# Patient Record
Sex: Male | Born: 1940 | Race: Black or African American | Hispanic: No | Marital: Married | Smoking: Former smoker
Health system: Southern US, Community
[De-identification: ages and names within clinical notes are randomized; demographics above are authoritative.]

## PROBLEM LIST (undated history)

## (undated) DIAGNOSIS — K759 Inflammatory liver disease, unspecified: Secondary | ICD-10-CM

## (undated) DIAGNOSIS — R0600 Dyspnea, unspecified: Secondary | ICD-10-CM

## (undated) DIAGNOSIS — I499 Cardiac arrhythmia, unspecified: Secondary | ICD-10-CM

## (undated) DIAGNOSIS — I429 Cardiomyopathy, unspecified: Secondary | ICD-10-CM

## (undated) DIAGNOSIS — I5022 Chronic systolic (congestive) heart failure: Secondary | ICD-10-CM

## (undated) DIAGNOSIS — M1990 Unspecified osteoarthritis, unspecified site: Secondary | ICD-10-CM

## (undated) DIAGNOSIS — I10 Essential (primary) hypertension: Secondary | ICD-10-CM

## (undated) DIAGNOSIS — I4891 Unspecified atrial fibrillation: Secondary | ICD-10-CM

## (undated) DIAGNOSIS — L02416 Cutaneous abscess of left lower limb: Secondary | ICD-10-CM

## (undated) DIAGNOSIS — I2510 Atherosclerotic heart disease of native coronary artery without angina pectoris: Secondary | ICD-10-CM

## (undated) HISTORY — PX: TOE AMPUTATION: SHX809

---
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
## 2015-08-16 ENCOUNTER — Emergency Department (HOSPITAL_BASED_OUTPATIENT_CLINIC_OR_DEPARTMENT_OTHER): Payer: Non-veteran care

## 2015-08-16 ENCOUNTER — Emergency Department (HOSPITAL_BASED_OUTPATIENT_CLINIC_OR_DEPARTMENT_OTHER)
Admission: EM | Admit: 2015-08-16 | Discharge: 2015-08-16 | Disposition: A | Discharge status: Home / Self Care | Payer: Non-veteran care | Attending: Emergency Medicine | Admitting: Emergency Medicine

## 2015-08-16 ENCOUNTER — Encounter (HOSPITAL_BASED_OUTPATIENT_CLINIC_OR_DEPARTMENT_OTHER): Payer: Self-pay

## 2015-08-16 DIAGNOSIS — I4892 Unspecified atrial flutter: Secondary | ICD-10-CM | POA: Insufficient documentation

## 2015-08-16 DIAGNOSIS — I10 Essential (primary) hypertension: Secondary | ICD-10-CM | POA: Diagnosis not present

## 2015-08-16 DIAGNOSIS — Z79899 Other long term (current) drug therapy: Secondary | ICD-10-CM | POA: Diagnosis not present

## 2015-08-16 DIAGNOSIS — J069 Acute upper respiratory infection, unspecified: Secondary | ICD-10-CM | POA: Insufficient documentation

## 2015-08-16 DIAGNOSIS — R600 Localized edema: Secondary | ICD-10-CM | POA: Insufficient documentation

## 2015-08-16 DIAGNOSIS — M791 Myalgia: Secondary | ICD-10-CM | POA: Diagnosis present

## 2015-08-16 HISTORY — DX: Essential (primary) hypertension: I10

## 2015-08-16 LAB — CBC WITH DIFFERENTIAL/PLATELET
BASOS PCT: 1 %
Basophils Absolute: 0 10*3/uL (ref 0.0–0.1)
EOS ABS: 0.1 10*3/uL (ref 0.0–0.7)
EOS PCT: 1 %
HCT: 47.9 % (ref 39.0–52.0)
Hemoglobin: 16.1 g/dL (ref 13.0–17.0)
LYMPHS ABS: 2.8 10*3/uL (ref 0.7–4.0)
Lymphocytes Relative: 35 %
MCH: 27.6 pg (ref 26.0–34.0)
MCHC: 33.6 g/dL (ref 30.0–36.0)
MCV: 82 fL (ref 78.0–100.0)
MONO ABS: 1.6 10*3/uL — AB (ref 0.1–1.0)
MONOS PCT: 21 %
NEUTROS PCT: 42 %
Neutro Abs: 3.3 10*3/uL (ref 1.7–7.7)
PLATELETS: 269 10*3/uL (ref 150–400)
RBC: 5.84 MIL/uL — ABNORMAL HIGH (ref 4.22–5.81)
RDW: 13.7 % (ref 11.5–15.5)
WBC: 7.8 10*3/uL (ref 4.0–10.5)

## 2015-08-16 LAB — BASIC METABOLIC PANEL
ANION GAP: 10 (ref 5–15)
BUN: 17 mg/dL (ref 6–20)
CALCIUM: 8.4 mg/dL — AB (ref 8.9–10.3)
CO2: 22 mmol/L (ref 22–32)
Chloride: 104 mmol/L (ref 101–111)
Creatinine, Ser: 0.91 mg/dL (ref 0.61–1.24)
GFR calc Af Amer: >60 mL/min (ref >60)
GLUCOSE: 109 mg/dL — AB (ref 65–99)
Potassium: 3.9 mmol/L (ref 3.5–5.1)
Sodium: 136 mmol/L (ref 135–145)

## 2015-08-16 LAB — BRAIN NATRIURETIC PEPTIDE: B NATRIURETIC PEPTIDE 5: 226.5 pg/mL — AB (ref 0.0–100.0)

## 2015-08-16 LAB — TROPONIN I
TROPONIN I: 0.06 ng/mL — AB (ref <0.031)
Troponin I: 0.06 ng/mL — ABNORMAL HIGH (ref <0.031)

## 2015-08-16 MED ORDER — BENZONATATE 100 MG PO CAPS: 100.0000 mg | ORAL_CAPSULE | Freq: Three times a day (TID) | ORAL | Status: DC

## 2015-08-16 MED ORDER — FUROSEMIDE 20 MG PO TABS: 20.0000 mg | ORAL_TABLET | Freq: Every day | ORAL | Status: DC

## 2015-08-16 MED ORDER — POTASSIUM CHLORIDE ER 10 MEQ PO TBCR: 10.0000 meq | EXTENDED_RELEASE_TABLET | Freq: Every day | ORAL | Status: DC

## 2015-08-16 NOTE — ED Notes (Signed)
MD at bedside. 

## 2015-08-16 NOTE — ED Notes (Signed)
Pt reports that he has been treated for tachycardia since 27th of feb, currently on metroprolol, on recheck patient's heart rate wnl.

## 2015-08-16 NOTE — ED Provider Notes (Signed)
CSN: [id number]     Arrival date & time 08/16/15  1828 History  By signing my name below, I, Christopher Benton, attest that this documentation has been prepared under the direction and in the presence of Quintella Reichert, MD . Electronically Signed: [name], Scribe. 08/16/2015. 7:39 PM.    Chief Complaint  Patient presents with  . Generalized Body Aches   The history is provided by the patient. No language interpreter was used.    HPI Comments:  Christopher Benton is a 75 y.o. male who presents to the Emergency Department complaining of chills and general malaise x 2 days with associated subjective fever and dry cough. Pt's wife and daughter with similar symptoms recently. No alleviating factors noted. He denies CP, abdominal pain, vomiting, diarrhea, dysuria,and LE swelling.   Pt was evaluated on 07/29/15 secondary to tachycardia. He was diagnosed with AFIB and placed on metoprolol. He was also started on Pradaxa and it was discontinued due to intolerance.  Cardiologist- Stanford Breed; pt has appointment set for the 29th of March 2017.   Past Medical History  Diagnosis Date  . Hypertension    History reviewed. No pertinent past surgical history. No family history on file. Social History  Substance Use Topics  . Smoking status: Never Smoker   . Smokeless tobacco: None  . Alcohol Use: No    Review of Systems  Constitutional: Positive for fever (subjective) and chills.  Respiratory: Positive for cough.   Cardiovascular: Negative for chest pain and leg swelling.  Gastrointestinal: Negative for vomiting, abdominal pain and diarrhea.  Genitourinary: Negative for dysuria.  Musculoskeletal: Positive for myalgias.  All other systems reviewed and are negative.   Allergies  Review of patient's allergies indicates no known allergies.  Home Medications   Prior to Admission medications   Medication Sig Start Date End Date Taking? Authorizing Provider  benzonatate (TESSALON) 100 MG capsule  Take 1 capsule (100 mg total) by mouth every 8 (eight) hours. 08/16/15   Quintella Reichert, MD  furosemide (LASIX) 20 MG tablet Take 1 tablet (20 mg total) by mouth daily. 08/16/15   Quintella Reichert, MD  metoprolol (LOPRESSOR) 50 MG tablet Take 50 mg by mouth 2 (two) times daily.   Yes Historical Provider, MD  potassium chloride (K-DUR) 10 MEQ tablet Take 1 tablet (10 mEq total) by mouth daily. 08/16/15   Quintella Reichert, MD   BP 120/78 mmHg  Pulse 43  Temp(Src) 98.7 F (37.1 C) (Oral)  Resp 12  SpO2 95% Physical Exam  Constitutional: He is oriented to person, place, and time. He appears well-developed and well-nourished.  HENT:  Head: Normocephalic and atraumatic.  Cardiovascular: Normal rate.  An irregularly irregular rhythm present.  No murmur heard. Pulmonary/Chest: Effort normal and breath sounds normal. No respiratory distress.  Abdominal: Soft. There is no tenderness. There is no rebound and no guarding.  Musculoskeletal: He exhibits edema. He exhibits no tenderness.  1+ pitting edema BLE  Neurological: He is alert and oriented to person, place, and time.  Skin: Skin is warm and dry.  Psychiatric: He has a normal mood and affect. His behavior is normal.  Nursing note and vitals reviewed.   ED Course  Procedures   DIAGNOSTIC STUDIES:  Oxygen Saturation is 97% on RA, normal by my interpretation.    COORDINATION OF CARE:  7:39 PM Will order EKG Discussed treatment plan with pt at bedside and pt agreed to plan.  Labs Review Labs Reviewed  CBC WITH DIFFERENTIAL/PLATELET - Abnormal; Notable for  the following:    RBC 5.84 (*)    Monocytes Absolute 1.6 (*)    All other components within normal limits  TROPONIN I - Abnormal; Notable for the following:    Troponin I 0.06 (*)    All other components within normal limits  BRAIN NATRIURETIC PEPTIDE - Abnormal; Notable for the following:    B Natriuretic Peptide 226.5 (*)    All other components within normal limits  BASIC  METABOLIC PANEL - Abnormal; Notable for the following:    Glucose, Bld 109 (*)    Calcium 8.4 (*)    All other components within normal limits  TROPONIN I - Abnormal; Notable for the following:    Troponin I 0.06 (*)    All other components within normal limits    Imaging Review Dg Chest 2 View  08/16/2015  CLINICAL DATA:  Cough and hypertension EXAM: CHEST  2 VIEW COMPARISON:  None. FINDINGS: Lungs are clear. Heart is borderline enlarged with pulmonary vascularity within normal limits. No adenopathy. There is degenerative change in the thoracic spine. IMPRESSION: Heart borderline enlarged.  No edema or consolidation. Electronically Signed   By: Lowella Grip III M.D.   On: 08/16/2015 19:04   I have personally reviewed and evaluated these images and lab results as part of my medical decision-making.   EKG Interpretation None      MDM   Final diagnoses:  URI (upper respiratory infection)  Atrial flutter, unspecified type [facility])   Patient here for evaluation of cough and malaise. He is nontoxic appearing on examination with no respiratory distress. He does have an irregular rhythm. He is not tachycardic in the department. He has some lower extremity edema but no evidence of pulmonary edema. EKG demonstrates atrial flutter. BNP is mildly elevated. Troponin is mildly elevated. Elevated troponin is likely secondary to his underlying atrial flutter. The presentation is not consistent with ACS or PE. Please note on documentation of vital signs that patient's was not bradycardic to the 40s but he his heart rate was consistently in the 70s to 80s range. Discussed with patient home care for URI as well as atrial flutter. Stressed the importance of following up with the cardiologist. Recommend follow-up sooner than March 29. Discussed outpatient follow-up and very close return precautions for any worsening or new symptoms.  I personally performed the services described in this documentation,  which was scribed in my presence. The recorded information has been reviewed and is accurate.    Quintella Reichert, MD 08/17/15 [phone]

## 2015-08-16 NOTE — Discharge Instructions (Signed)
Cough, Adult Coughing is a reflex that clears your throat and your airways. Coughing helps to heal and protect your lungs. It is normal to cough occasionally, but a cough that happens with other symptoms or lasts a long time may be a sign of a condition that needs treatment. A cough may last only 2-3 weeks (acute), or it may last longer than 8 weeks (chronic). CAUSES Coughing is commonly caused by:  Breathing in substances that irritate your lungs.  A viral or bacterial respiratory infection.  Allergies.  Asthma.  Postnasal drip.  Smoking.  Acid backing up from the stomach into the esophagus (gastroesophageal reflux).  Certain medicines.  Chronic lung problems, including COPD (or rarely, lung cancer).  Other medical conditions such as heart failure. HOME CARE INSTRUCTIONS  Pay attention to any changes in your symptoms. Take these actions to help with your discomfort:  Take medicines only as told by your health care provider.  If you were prescribed an antibiotic medicine, take it as told by your health care provider. Do not stop taking the antibiotic even if you start to feel better.  Talk with your health care provider before you take a cough suppressant medicine.  Drink enough fluid to keep your urine clear or pale yellow.  If the air is dry, use a cold steam vaporizer or humidifier in your bedroom or your home to help loosen secretions.  Avoid anything that causes you to cough at work or at home.  If your cough is worse at night, try sleeping in a semi-upright position.  Avoid cigarette smoke. If you smoke, quit smoking. If you need help quitting, ask your health care provider.  Avoid caffeine.  Avoid alcohol.  Rest as needed. SEEK MEDICAL CARE IF:   You have new symptoms.  You cough up pus.  Your cough does not get better after 2-3 weeks, or your cough gets worse.  You cannot control your cough with suppressant medicines and you are losing sleep.  You  develop pain that is getting worse or pain that is not controlled with pain medicines.  You have a fever.  You have unexplained weight loss.  You have night sweats. SEEK IMMEDIATE MEDICAL CARE IF:  You cough up blood.  You have difficulty breathing.  Your heartbeat is very fast.   This information is not intended to replace advice given to you by your health care provider. Make sure you discuss any questions you have with your health care provider.   Document Released: 11/14/2010 Document Revised: 02/06/2015 Document Reviewed: 07/25/2014 Elsevier Interactive Patient Education 2016 Elsevier Inc.  Atrial Flutter Atrial flutter is a heart rhythm that can cause the heart to beat very fast (tachycardia). It originates in the upper chambers of the heart (atria). In atrial flutter, the top chambers of the heart (atria) often beat much faster than the bottom chambers of the heart (ventricles). Atrial flutter has a regular "saw toothed" appearance in an EKG readout. An EKG is a test that records the electrical activity of the heart. Atrial flutter can cause the heart to beat up to 150 beats per minute (BPM). Atrial flutter can either be short lived (paroxysmal) or permanent.  CAUSES  Causes of atrial flutter can be many. Some of these include:  Heart related issues:  Heart attack (myocardial infarction).  Heart failure.  Heart valve problems.  Poorly controlled high blood pressure (hypertension).  After open heart surgery.  Lung related issues:  A blood clot in the lungs (pulmonary embolism).  Chronic obstructive pulmonary disease (COPD). Medications used to treat COPD can attribute to atrial flutter.  Other related causes:  Hyperthyroidism.  Caffeine.  Some decongestant cold medications.  Low electrolyte levels such as potassium or magnesium.  Cocaine. SYMPTOMS  An awareness of your heart beating rapidly (palpitations).  Shortness of breath.  Chest pain.  Low  blood pressure (hypotension).  Dizziness or fainting. DIAGNOSIS  Different tests can be performed to diagnose atrial flutter.   An EKG.  Holter monitor. This is a 24-hour recording of your heart rhythm. You will also be given a diary. Write down all symptoms that you have and what you were doing at the time you experienced symptoms.  Cardiac event monitor. This small device can be worn for up to 30 days. When you have heart symptoms, you will push a button on the device. This will then record your heart rhythm.  Echocardiogram. This is an imaging test to look at your heart. Your caregiver will look at your heart valves and the ventricles.  Stress test. This test can help determine if the atrial flutter is related to exercise or if coronary artery disease is present.  Laboratory studies will look at certain blood levels like:  Complete blood count (CBC).  Potassium.  Magnesium.  Thyroid function. TREATMENT  Treatment of atrial flutter varies. A combination of therapies may be used or sometimes atrial flutter may need only 1 type of treatment.  Lab work: If your blood work, such as your electrolytes (potassium, magnesium) or your thyroid function tests, are abnormal, your caregiver will treat them accordingly.  Medication:  There are several different types of medications that can convert your heart to a normal rhythm and prevent atrial flutter from reoccurring.  Nonsurgical procedures: Nonsurgical techniques may be used to control atrial flutter. Some examples include:  Cardioversion. This technique uses either drugs or an electrical shock to restore a normal heart rhythm:  Cardioversion drugs may be given through an intravenous (IV) line to help "reset" the heart rhythm.  In electrical cardioversion, your caregiver shocks your heart with electrical energy. This helps to reset the heartbeat to a normal rhythm.  Ablation. If atrial flutter is a persistent problem, an ablation  may be needed. This procedure is done under mild sedation. High frequency radio-wave energy is used to destroy the area of heart tissue responsible for atrial flutter. SEEK IMMEDIATE MEDICAL CARE IF:  You have:  Dizziness.  Near fainting or fainting.  Shortness of breath.  Chest pain or pressure.  Sudden nausea or vomiting.  Profuse sweating. If you have the above symptoms, call your local emergency service immediately! Do not drive yourself to the hospital. MAKE SURE YOU:   Understand these instructions.  Will watch your condition.  Will get help right away if you are not doing well or get worse.   This information is not intended to replace advice given to you by your health care provider. Make sure you discuss any questions you have with your health care provider.   Document Released: 10/04/2008 Document Revised: 06/08/2014 Document Reviewed: 11/30/2014 Elsevier Interactive Patient Education [employer].

## 2015-08-16 NOTE — ED Notes (Signed)
Reports generalized body aches x 3 days. Also reports non-productive cough. Reports sick family members as well.

## 2015-08-16 NOTE — ED Notes (Signed)
Pt verbalizes understanding of d/c instructions and denies any further needs at this time. 

## 2015-08-28 ENCOUNTER — Encounter: Payer: Self-pay | Admitting: *Deleted

## 2015-08-28 ENCOUNTER — Other Ambulatory Visit: Payer: Self-pay | Admitting: Cardiology

## 2015-08-28 ENCOUNTER — Ambulatory Visit (INDEPENDENT_AMBULATORY_CARE_PROVIDER_SITE_OTHER): Payer: Non-veteran care | Admitting: Cardiology

## 2015-08-28 ENCOUNTER — Encounter: Payer: Self-pay | Admitting: Cardiology

## 2015-08-28 VITALS — BP 136/105 | HR 70 | Ht 71.5 in | Wt 266.4 lb

## 2015-08-28 DIAGNOSIS — I10 Essential (primary) hypertension: Secondary | ICD-10-CM | POA: Insufficient documentation

## 2015-08-28 DIAGNOSIS — I484 Atypical atrial flutter: Secondary | ICD-10-CM | POA: Insufficient documentation

## 2015-08-28 DIAGNOSIS — I483 Typical atrial flutter: Secondary | ICD-10-CM

## 2015-08-28 LAB — CBC
HCT: 51.7 % (ref 39.0–52.0)
Hemoglobin: 16.3 g/dL (ref 13.0–17.0)
MCH: 27 pg (ref 26.0–34.0)
MCHC: 31.5 g/dL (ref 30.0–36.0)
MCV: 85.6 fL (ref 78.0–100.0)
MPV: 10.7 fL (ref 8.6–12.4)
PLATELETS: 283 10*3/uL (ref 150–400)
RBC: 6.04 MIL/uL — ABNORMAL HIGH (ref 4.22–5.81)
RDW: 13.8 % (ref 11.5–15.5)
WBC: 11.9 10*3/uL — ABNORMAL HIGH (ref 4.0–10.5)

## 2015-08-28 MED ORDER — METOPROLOL TARTRATE 100 MG PO TABS: 100.0000 mg | ORAL_TABLET | Freq: Two times a day (BID) | ORAL | Status: DC

## 2015-08-28 MED ORDER — APIXABAN 5 MG PO TABS: 5.0000 mg | ORAL_TABLET | Freq: Two times a day (BID) | ORAL | Status: DC

## 2015-08-28 NOTE — Assessment & Plan Note (Signed)
Blood pressure is elevated and heart rate up to normal. Increase metoprolol to 75 mg twice a day.Follow blood pressure and adjust as needed.

## 2015-08-28 NOTE — Assessment & Plan Note (Addendum)
Patient presents with newly diagnosed atrial flutter. He apparently was admitted to the [place] in February with atrial fibrillation but no records are available. We will obtain records concerning his rhythm, echocardiogram and lab work. He is symptomatic with his atrial flutter. He notes fatigue and dyspnea on exertion. Check hemoglobin, renal function and TSH. We will initiate apixaban 5 mg BID (CHADSvasc 2). Proceed with TEE guided cardioversion next week. If all of his rhythm disturbance has been atrial flutter we will refer to electrophysiology for consideration of ablation. This would allow discontinuation of anticoagulation in the future. If he indeed did have atrial fibrillation at the [place] we will plan to continue with metoprolol and anticoagulation with addition of antiarrhythmics in the future if atrial fibrillation recurs.

## 2015-08-28 NOTE — Patient Instructions (Signed)
Medication Instructions:   INCREASE METOPROLOL TO 100 MG TWICE DAILY=2 OF THE 50 MG TABLETS TWICE DAILY  START ELIQUIS 5 MG TWICE DAILY  Labwork:  Your physician recommends that you return for lab work in: [place]   Testing/Procedures:  Your physician has requested that you have a TEE/Cardioversion. During a TEE, sound waves are used to create images of your heart. It provides your doctor with information about the size and shape of your heart and how well your heart's chambers and valves are working. In this test, a transducer is attached to the end of a flexible tube that is guided down you throat and into your esophagus (the tube leading from your mouth to your stomach) to get a more detailed image of your heart. Once the TEE has determined that a blood clot is not present, the cardioversion begins. Electrical Cardioversion uses a jolt of electricity to your heart either through paddles or wired patches attached to your chest. This is a controlled, usually prescheduled, procedure. This procedure is done at the hospital and you are not awake during the procedure. You usually go home the day of the procedure. Please see the instruction sheet given to you today for more information.    Follow-Up:  Your physician recommends that you schedule a follow-up appointment in: [place]

## 2015-08-28 NOTE — Progress Notes (Signed)
     HPI: 75 year old male for evaluation of atrial flutter. In February the patient experienced fatigue and his heart rate was elevated. He apparently was admitted to the [facility] with a diagnosis of atrial fibrillation. No records are available. He had an echocardiogram and blood work. He was placed on metoprolol and pradaxa. Pradaxa was DCed due to nausea. Seen recently in the emergency room for URI. Troponin 0.06. Chest x-ray negative. Creatinine 0.91. Hemoglobin 16.1. BNP 226. Electrocardiogram showed atrial flutter. Patient notes increased fatigue and mild dyspnea on exertion. No orthopnea, PND, pedal edema, palpitations, syncope, chest pain or bleeding.  Current Outpatient Prescriptions  Medication Sig Dispense Refill  . aspirin 325 MG tablet Take 325 mg by mouth daily.    . metoprolol (LOPRESSOR) 50 MG tablet Take 50 mg by mouth 2 (two) times daily.     No current facility-administered medications for this visit.    Allergies  Allergen Reactions  . Pradaxa [Dabigatran Etexilate Mesylate] Nausea Only     Past Medical History  Diagnosis Date  . Hypertension     Past Surgical History  Procedure Laterality Date  . Toe amputation      Social History   Social History  . Marital Status: Married    Spouse Name: N/A  . Number of Children: 2  . Years of Education: N/A   Occupational History  . Not on file.   Social History Main Topics  . Smoking status: Former Research scientist (life sciences)  . Smokeless tobacco: Not on file  . Alcohol Use: 0.0 oz/week    0 Standard drinks or equivalent per week     Comment: Occasional  . Drug Use: Not on file  . Sexual Activity: Not on file   Other Topics Concern  . Not on file   Social History Narrative    Family History  Problem Relation Age of Onset  . Heart disease      No family history    ROS: no fevers or chills, productive cough, hemoptysis, dysphasia, odynophagia, melena, hematochezia, dysuria, hematuria, rash, seizure activity,  orthopnea, PND, pedal edema, claudication. Remaining systems are negative.  Physical Exam:   Blood pressure 136/105, pulse 70, height 5' 11.5" (1.816 m), weight 266 lb 6.4 oz (120.838 kg).  General:  Well developed/well nourished in NAD Skin warm/dry Patient not depressed No peripheral clubbing Back-normal HEENT-normal/normal eyelids Neck supple/normal carotid upstroke bilaterally; no bruits; no JVD; no thyromegaly chest - CTA/ normal expansion CV - irregular/normal S1 and S2; no murmurs, rubs or gallops;  PMI nondisplaced Abdomen -NT/ND, no HSM, no mass, + bowel sounds, no bruit 2+ femoral pulses, no bruits Ext-no edema, chords, 2+ DP Neuro-grossly nonfocal  ECG 08/16/2015-atrial flutter with PVCs or aberrantly conducted beats. Left anterior fascicular block. Nonspecific ST changes.  Today's electrocardiogram shows typical atrial flutter, right axis deviation and nonspecific ST changes.

## 2015-08-29 ENCOUNTER — Telehealth: Payer: Self-pay | Admitting: Cardiology

## 2015-08-29 LAB — BASIC METABOLIC PANEL
BUN: 15 mg/dL (ref 7–25)
CHLORIDE: 104 mmol/L (ref 98–110)
CO2: 20 mmol/L (ref 20–31)
Calcium: 9.7 mg/dL (ref 8.6–10.3)
Creat: 0.91 mg/dL (ref 0.70–1.18)
GLUCOSE: 159 mg/dL — AB (ref 65–99)
Potassium: 4.5 mmol/L (ref 3.5–5.3)
Sodium: 138 mmol/L (ref 135–146)

## 2015-08-29 LAB — TSH: TSH: 2.45 mIU/L (ref 0.40–4.50)

## 2015-08-29 NOTE — Telephone Encounter (Signed)
Faxed Release signed by patient to [facility] to obtain records per Dr Stanford Breed request.  Faxed on 08/29/15. lp

## 2015-08-30 ENCOUNTER — Encounter: Payer: Self-pay | Admitting: *Deleted

## 2015-08-30 ENCOUNTER — Telehealth: Payer: Self-pay | Admitting: Cardiology

## 2015-08-30 NOTE — Telephone Encounter (Signed)
Spoke to patient's wife Information given. Verbalized understanding  Wife states patient had not been fasting when labs were done.  [facility] primary [facility] in the process of getting a local primary doctor  RN INFORMED WIFE -WILL MAIL A COPY OF LAB RESULTS - (WILL BE ABLE TO TAKE TO [facility] Wife verbalized understanding.

## 2015-08-30 NOTE — Telephone Encounter (Signed)
New message  ° ° ° °Wife calling back for test results.   °

## 2015-09-04 ENCOUNTER — Encounter (HOSPITAL_COMMUNITY)
Admission: RE | Disposition: A | Discharge status: Home / Self Care | Payer: Self-pay | Source: Ambulatory Visit | Attending: Cardiovascular Disease

## 2015-09-04 ENCOUNTER — Ambulatory Visit (HOSPITAL_BASED_OUTPATIENT_CLINIC_OR_DEPARTMENT_OTHER): Payer: Medicare Other

## 2015-09-04 ENCOUNTER — Ambulatory Visit (HOSPITAL_COMMUNITY): Payer: Medicare Other | Admitting: Certified Registered"

## 2015-09-04 ENCOUNTER — Ambulatory Visit (HOSPITAL_COMMUNITY)
Admission: RE | Admit: 2015-09-04 | Discharge: 2015-09-04 | Disposition: A | Discharge status: Home / Self Care | Payer: Medicare Other | Source: Ambulatory Visit | Attending: Cardiovascular Disease | Admitting: Cardiovascular Disease

## 2015-09-04 ENCOUNTER — Telehealth: Payer: Self-pay | Admitting: Cardiology

## 2015-09-04 ENCOUNTER — Encounter (HOSPITAL_COMMUNITY): Payer: Self-pay | Admitting: *Deleted

## 2015-09-04 DIAGNOSIS — E669 Obesity, unspecified: Secondary | ICD-10-CM | POA: Insufficient documentation

## 2015-09-04 DIAGNOSIS — I10 Essential (primary) hypertension: Secondary | ICD-10-CM | POA: Insufficient documentation

## 2015-09-04 DIAGNOSIS — I4892 Unspecified atrial flutter: Secondary | ICD-10-CM | POA: Insufficient documentation

## 2015-09-04 DIAGNOSIS — I483 Typical atrial flutter: Secondary | ICD-10-CM | POA: Insufficient documentation

## 2015-09-04 DIAGNOSIS — Z6836 Body mass index (BMI) 36.0-36.9, adult: Secondary | ICD-10-CM | POA: Insufficient documentation

## 2015-09-04 DIAGNOSIS — Z87891 Personal history of nicotine dependence: Secondary | ICD-10-CM | POA: Insufficient documentation

## 2015-09-04 HISTORY — PX: CARDIOVERSION: SHX1299

## 2015-09-04 HISTORY — DX: Cardiac arrhythmia, unspecified: I49.9

## 2015-09-04 HISTORY — PX: TEE WITHOUT CARDIOVERSION: SHX5443

## 2015-09-04 SURGERY — CARDIOVERSION
Anesthesia: Monitor Anesthesia Care

## 2015-09-04 MED ORDER — SODIUM CHLORIDE 0.9 % IV SOLN
INTRAVENOUS | Status: DC
  Administered 2015-09-04: 500 mL via INTRAVENOUS

## 2015-09-04 MED ORDER — PROPOFOL 500 MG/50ML IV EMUL
INTRAVENOUS | Status: DC | PRN
  Administered 2015-09-04: 200 ug/kg/min via INTRAVENOUS

## 2015-09-04 NOTE — Telephone Encounter (Signed)
New message      Pt is on his way home from having a cardioversion by Dr Oval Linsey.  He is now in sinus rhythm and his bp is 107/62.  Patient want to know if he is to continue taking his 100mg  metoprolol?  Please call before you leave if possible

## 2015-09-04 NOTE — Discharge Instructions (Signed)
Electrical Cardioversion, Care After °Refer to this sheet in the next few weeks. These instructions provide you with information on caring for yourself after your procedure. Your health care provider may also give you more specific instructions. Your treatment has been planned according to current medical practices, but problems sometimes occur. Call your health care provider if you have any problems or questions after your procedure. °WHAT TO EXPECT AFTER THE PROCEDURE °After your procedure, it is typical to have the following sensations: °· Some redness on the skin where the shocks were delivered. If this is tender, a sunburn lotion or hydrocortisone cream may help. °· Possible return of an abnormal heart rhythm within hours or days after the procedure. °HOME CARE INSTRUCTIONS °· Take medicines only as directed by your health care provider. Be sure you understand how and when to take your medicine. °· Learn how to feel your pulse and check it often. °· Limit your activity for 48 hours after the procedure or as directed by your health care provider. °· Avoid or minimize caffeine and other stimulants as directed by your health care provider. °SEEK MEDICAL CARE IF: °· You feel like your heart is beating too fast or your pulse is not regular. °· You have any questions about your medicines. °· You have bleeding that will not stop. °SEEK IMMEDIATE MEDICAL CARE IF: °· You are dizzy or feel faint. °· It is hard to breathe or you feel short of breath. °· There is a change in discomfort in your chest. °· Your speech is slurred or you have trouble moving an arm or leg on one side of your body. °· You get a serious muscle cramp that does not go away. °· Your fingers or toes turn cold or blue. °  °This information is not intended to replace advice given to you by your health care provider. Make sure you discuss any questions you have with your health care provider. °  °Document Released: 03/08/2013 Document Revised: 06/08/2014  Document Reviewed: 03/08/2013 °Elsevier Interactive Patient Education ©2016 Elsevier Inc. ° °

## 2015-09-04 NOTE — Transfer of Care (Signed)
Immediate Anesthesia Transfer of Care Note  Patient: Christopher Benton  Procedure(s) Performed: Procedure(s): CARDIOVERSION (N/A) TRANSESOPHAGEAL ECHOCARDIOGRAM (TEE) (N/A)  Patient Location: Endoscopy Unit  Anesthesia Type:MAC  Level of Consciousness: awake, alert , oriented and patient cooperative  Airway & Oxygen Therapy: Patient Spontanous Breathing and Patient connected to nasal cannula oxygen  Post-op Assessment: Report given to RN, Post -op Vital signs reviewed and stable and Patient moving all extremities  Post vital signs: Reviewed and stable  Last Vitals:  Filed Vitals:   09/04/15 1320  BP: 127/93  Pulse: 101  Resp: 20    Complications: No apparent anesthesia complications

## 2015-09-04 NOTE — Anesthesia Postprocedure Evaluation (Signed)
Anesthesia Post Note  Patient: Christopher Benton  Procedure(s) Performed: Procedure(s) (LRB): CARDIOVERSION (N/A) TRANSESOPHAGEAL ECHOCARDIOGRAM (TEE) (N/A)  Patient location during evaluation: PACU Anesthesia Type: MAC Level of consciousness: awake Pain management: pain level controlled Vital Signs Assessment: post-procedure vital signs reviewed and stable Respiratory status: spontaneous breathing and respiratory function stable Cardiovascular status: stable Postop Assessment: no signs of nausea or vomiting Anesthetic complications: no    Last Vitals:  Filed Vitals:   09/04/15 1516 09/04/15 1518  BP: 74/53 85/58  Pulse: 62   Resp: 13     Last Pain: There were no vitals filed for this visit.               Dax Murguia

## 2015-09-04 NOTE — Progress Notes (Signed)
  Echocardiogram Echocardiogram Transesophageal has been performed.  Darlina Sicilian M 09/04/2015, 3:10 PM

## 2015-09-04 NOTE — Anesthesia Preprocedure Evaluation (Signed)
Anesthesia Evaluation  Patient identified by MRN, date of birth, ID band Patient awake    Reviewed: Allergy & Precautions, NPO status , Patient's Chart, lab work & pertinent test results  Airway Mallampati: II  TM Distance: >3 FB Neck ROM: Full    Dental no notable dental hx.    Pulmonary former smoker,    Pulmonary exam normal breath sounds clear to auscultation       Cardiovascular hypertension, Normal cardiovascular exam+ dysrhythmias Atrial Fibrillation  Rhythm:Irregular Rate:Normal     Neuro/Psych negative neurological ROS  negative psych ROS   GI/Hepatic negative GI ROS, Neg liver ROS,   Endo/Other  negative endocrine ROS  Renal/GU negative Renal ROS     Musculoskeletal negative musculoskeletal ROS (+)   Abdominal (+) + obese,   Peds  Hematology negative hematology ROS (+)   Anesthesia Other Findings   Reproductive/Obstetrics                             Anesthesia Physical Anesthesia Plan  ASA: II  Anesthesia Plan: MAC   Post-op Pain Management:    Induction: Intravenous  Airway Management Planned: Simple Face Mask  Additional Equipment:   Intra-op Plan:   Post-operative Plan: Extubation in OR  Informed Consent: I have reviewed the patients History and Physical, chart, labs and discussed the procedure including the risks, benefits and alternatives for the proposed anesthesia with the patient or authorized representative who has indicated his/her understanding and acceptance.   Dental advisory given  Plan Discussed with: CRNA  Anesthesia Plan Comments:         Anesthesia Quick Evaluation

## 2015-09-04 NOTE — CV Procedure (Signed)
Brief TEE Note  LVEF approximately 20% Trivial MR, mild TR No LAA or LA thrombus or mass.  For additional details see full report.  Electrical Cardioversion Procedure Note GERAN LAUKAITIS [id number] 1940-12-29  Procedure: Electrical Cardioversion Indications:  Atrial Flutter  Procedure Details Consent: Risks of procedure as well as the alternatives and risks of each were explained to the (patient/caregiver).  Consent for procedure obtained. Time Out: Verified patient identification, verified procedure, site/side was marked, verified correct patient position, special equipment/implants available, medications/allergies/relevent history reviewed, required imaging and test results available.  Performed  Patient placed on cardiac monitor, pulse oximetry, supplemental oxygen as necessary.  Sedation given: Propofol Pacer pads placed anterior and posterior chest.  Cardioverted 1 time(s).  Cardioverted at [place].  Evaluation Findings: Post procedure EKG shows: sinus bradycardia with PACs. Complications: None Patient did not tolerate procedure well.  Nasri Boakye C. Oval Linsey, MD, [facility]   09/04/2015, 3:05 PM

## 2015-09-04 NOTE — Telephone Encounter (Signed)
Recommendation to continue meds as indicated - he has had no missed doses. Did encourage adequate hydration & full meal tonight if patient feels poorly following NPO status. Advised to contact if HR or BP runs low or if pt has new concerns. Pt verbalized understanding.

## 2015-09-05 ENCOUNTER — Telehealth: Payer: Self-pay | Admitting: Cardiology

## 2015-09-05 ENCOUNTER — Encounter (HOSPITAL_COMMUNITY): Payer: Self-pay | Admitting: Cardiovascular Disease

## 2015-09-05 NOTE — Telephone Encounter (Signed)
Received records from [facility] as requested by Dr Stanford Breed.  Records given to Dr Stanford Breed to review.  Patient also has upcoming appointment on 09/25/15. lp

## 2015-09-08 NOTE — H&P (Signed)
Christopher Benton is a 75 y.o. male who has presented today for surgery, with the diagnosis of atrial flutter. The various methods of treatment have been discussed with the patient and family. After consideration of risks, benefits and other options for treatment, the patient has consented to Procedure(s): TRANSESOPHAGEAL ECHOCARDIOGRAM (TEE) (N/A) and direct current cardioversion as a surgical intervention . The patient's history has been reviewed, patient examined, no change in status, stable for surgery. I have reviewed the patient's chart and labs. Questions were answered to the patient's satisfaction.   Arlissa Monteverde C. Oval Linsey, MD, [facility]  09/04/15

## 2015-09-10 ENCOUNTER — Telehealth: Payer: Self-pay | Admitting: Nurse Practitioner

## 2015-09-10 NOTE — Telephone Encounter (Signed)
   Pts wife called to report that pt has been having some increase in lower extremity swelling since his cardioversion.  He has not had dyspnea, pnd, orthopnea.  Edema is mild.  He was recently found to have AF with an EF of 10-15 % and underwent successful TEE/DCCV on 4/5.    Wife would like for [name] to call in lasix.  I advised that as he is stable, he would be better served from formal evaluation and I have forwarded his information to scheduling and they will be contacted tomorrow for a [facility] clinic appt.  For tonight, I recommended that he keep his legs elevated and avoid salty/processed foods.  Caller verbalized understanding and was grateful for the call back.  Murray Hodgkins, NP 09/10/2015, 6:20 PM

## 2015-09-11 ENCOUNTER — Telehealth: Payer: Self-pay | Admitting: Cardiology

## 2015-09-11 ENCOUNTER — Ambulatory Visit: Payer: Medicare Other | Admitting: Physician Assistant

## 2015-09-11 NOTE — Telephone Encounter (Signed)
2nd attempt to call goes to VM.

## 2015-09-11 NOTE — Telephone Encounter (Signed)
New message      Christopher Benton needs to be seen in [facility] on Wed 3/12. Wife called tonight saying that he's getting lower ext edema. He has a recent finding of cardiomyopathy and AF s/p cardioversion. Christopher Benton is Flex on 4/12 and has some open slots.   Message from Ignacia Bayley last night at 6:15pm.  Since it is same day, someone in triage needs to ok this.

## 2015-09-11 NOTE — Telephone Encounter (Signed)
Pt's wife called back in stating that the incident she called to report in regards to the pt's foot swelling, she now believes that it is in regards to the footwear he was instructed to wear after his surgery. So she would like to recant previous complaint.   Thanks

## 2015-09-11 NOTE — Telephone Encounter (Signed)
Spoke to [employer] - we will hold a slot tentatively on Hao's schedule for 2pm today. I reached out to pt at 2 listed numbers. Home line goes to VM - left msg to call. Unable to leave msg on listed mobile device.  Will await call. If pt doesn't/cannot call back, I advised Butch Penny we should make slot available for other add-in.

## 2015-09-11 NOTE — Telephone Encounter (Signed)
3rd attempt to reach patient, unsuccessful.

## 2015-09-11 NOTE — Telephone Encounter (Signed)
Acknowledged.

## 2015-09-23 NOTE — Progress Notes (Signed)
      HPI: FU atrial flutter. In February the patient experienced fatigue and his heart rate was elevated. He was admitted to the [facility] with a diagnosis of atrial flutter. Records reviewed and show echocardiogram February 2017 showed ejection fraction 35-40%, moderate left ventricular hypertrophy, mildly reduced RV function, moderate left atrial enlargement, mild tricuspid regurgitation and moderate elevation in pulmonary pressures. Chest CT showed atherosclerotic calcifications in the coronaries. He was placed on metoprolol and pradaxa. Pradaxa was DCed due to nausea. TSH 08/28/15 normal; hgb 16.3, glucose 159. Placed on apixaban at last ov. Had TEE 09/04/15 that showed EF 10-15, severe BAE, mild RVE with severely reduced RV function; had successful DCCV at that time. Since last seen, he feels much better. His dyspnea on exertion has improved. No orthopnea, PND, pedal edema, chest pain, palpitations or syncope.  Current Outpatient Prescriptions  Medication Sig Dispense Refill  . apixaban (ELIQUIS) 5 MG TABS tablet Take 1 tablet (5 mg total) by mouth 2 (two) times daily. 60 tablet 6  . metoprolol (LOPRESSOR) 100 MG tablet Take 1 tablet (100 mg total) by mouth 2 (two) times daily.    . Multiple Vitamins-Minerals (MULTIVITAMIN WITH MINERALS) tablet Take 1 tablet by mouth daily.     No current facility-administered medications for this visit.     Past Medical History  Diagnosis Date  . Hypertension   . Dysrhythmia Atrial flutter    Past Surgical History  Procedure Laterality Date  . Toe amputation    . Cardioversion N/A 09/04/2015    Procedure: CARDIOVERSION;  Surgeon: Skeet Latch, MD;  Location: [place];  Service: Cardiovascular;  Laterality: N/A;  . Tee without cardioversion N/A 09/04/2015    Procedure: TRANSESOPHAGEAL ECHOCARDIOGRAM (TEE);  Surgeon: Skeet Latch, MD;  Location: [facility] ENDOSCOPY;  Service: Cardiovascular;  Laterality: N/A;    Social History   Social History    . Marital Status: Married    Spouse Name: N/A  . Number of Children: 2  . Years of Education: N/A   Occupational History  . Not on file.   Social History Main Topics  . Smoking status: Former Research scientist (life sciences)  . Smokeless tobacco: Not on file  . Alcohol Use: 0.0 oz/week    0 Standard drinks or equivalent per week     Comment: Occasional  . Drug Use: Not on file  . Sexual Activity: Not on file   Other Topics Concern  . Not on file   Social History Narrative    Family History  Problem Relation Age of Onset  . Heart disease      No family history    ROS: no fevers or chills, productive cough, hemoptysis, dysphasia, odynophagia, melena, hematochezia, dysuria, hematuria, rash, seizure activity, orthopnea, PND, pedal edema, claudication. Remaining systems are negative.  Physical Exam: Well-developed well-nourished in no acute distress.  Skin is warm and dry.  HEENT is normal.  Neck is supple.  Chest is clear to auscultation with normal expansion.  Cardiovascular exam is irregular Abdominal exam nontender or distended. No masses palpated. Extremities show no edema. neuro grossly intact  ECG Atrial fibrillation with PVCs or aberrantly conducted beats.Left axis deviation. Nonspecific ST changes.

## 2015-09-25 ENCOUNTER — Telehealth: Payer: Self-pay | Admitting: Cardiology

## 2015-09-25 ENCOUNTER — Ambulatory Visit (INDEPENDENT_AMBULATORY_CARE_PROVIDER_SITE_OTHER): Payer: Non-veteran care | Admitting: Cardiology

## 2015-09-25 ENCOUNTER — Encounter: Payer: Self-pay | Admitting: Cardiology

## 2015-09-25 VITALS — BP 127/89 | HR 84 | Ht 71.5 in | Wt 259.0 lb

## 2015-09-25 DIAGNOSIS — I10 Essential (primary) hypertension: Secondary | ICD-10-CM

## 2015-09-25 DIAGNOSIS — I429 Cardiomyopathy, unspecified: Secondary | ICD-10-CM | POA: Diagnosis not present

## 2015-09-25 DIAGNOSIS — I480 Paroxysmal atrial fibrillation: Secondary | ICD-10-CM

## 2015-09-25 MED ORDER — LISINOPRIL 2.5 MG PO TABS: 2.5000 mg | ORAL_TABLET | Freq: Every day | ORAL | Status: DC

## 2015-09-25 NOTE — Assessment & Plan Note (Signed)
Patient was previously in atrial flutter. He underwent successful cardioversion. However he now has atrial fibrillation. His rate appears to be controlled. Continue metoprolol at present dose. He feels much better now. I wonder if his heart rate had been much more difficult to control and elevated when in atrial flutter. I discussed options at length today. One would be to begin antiarrhythmic such as amiodarone and repeat cardioversion. Alternatively we could plan rate control and anticoagulation as his symptoms are markedly improved. They would like to consider this before proceeding. We will see back in 6 weeks to make final decision. If he decides on rate control he will need a monitor to make sure that rate is controlled over 24 hours. Continue apixaban.

## 2015-09-25 NOTE — Telephone Encounter (Signed)
New message     Need last ov note faxed to the [place] at [phone] attn Dr Rondell Reams so that they can fill his presc for eliquis 5mg .  If any problems, please call.

## 2015-09-25 NOTE — Assessment & Plan Note (Signed)
Blood pressure controlled. Continue present medications. 

## 2015-09-25 NOTE — Patient Instructions (Signed)
Medication Instructions:   START LISINOPRIL 2.5 MG ONCE DAILY  Labwork:  Your physician recommends that you return for lab work in: [place]:  Your physician recommends that you schedule a follow-up appointment in: [place]

## 2015-09-25 NOTE — Assessment & Plan Note (Signed)
Etiology unclear. 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 Hopefully this will improve as his heart rate is controlled. He will need an ischemia evaluation in the future to exclude coronary disease. We will continue with beta blocker. Add lisinopril 2.5 mg daily. Check potassium and renal function in 1 week. Advance medications as tolerated. Repeat echocardiogram 3 months after medications fully titrated.

## 2015-09-25 NOTE — Telephone Encounter (Signed)
ROUTED BOTH OFFICE NOTES( 07/2015,09/25/2015) TO THE GIVEN FAX NUMBER AS REQUESTED

## 2015-09-26 ENCOUNTER — Telehealth: Payer: Self-pay | Admitting: Cardiology

## 2015-09-26 DIAGNOSIS — I4819 Other persistent atrial fibrillation: Secondary | ICD-10-CM

## 2015-09-26 NOTE — Telephone Encounter (Signed)
pt's wife calling regarding visit yesterday-pt was given a couple options, and they have a few questions in order to make their decision -pls call

## 2015-09-26 NOTE — Telephone Encounter (Signed)
Spoke with pt wife, questions regarding ov yesterday and the options for treatment discussed in detail. They have considered the options and want to continue with rate control at this time. Will make dr Stanford Breed aware. Order placed for 24 hour holter and sent to admin for scheduling.

## 2015-10-01 ENCOUNTER — Telehealth: Payer: Self-pay | Admitting: Cardiology

## 2015-10-01 NOTE — Telephone Encounter (Signed)
OV notes refaxed to number given.

## 2015-10-01 NOTE — Telephone Encounter (Signed)
New message     [facility] did not get office visit note from [name] in order to fill his eliquis presc.  (See 09-25-15 message)  Please refax ov note to [phone] attn Beth.  Call if there is a problem

## 2015-10-02 ENCOUNTER — Telehealth: Payer: Self-pay | Admitting: Cardiology

## 2015-10-02 NOTE — Telephone Encounter (Signed)
Patient calling the office for samples of medication:   1.  What medication and dosage are you requesting samples for? Eliquis 5mg    2.  Are you currently out of this medication? Yes ( will be on tomorrow )

## 2015-10-02 NOTE — Telephone Encounter (Signed)
SPOKE TO WIFE   NO SAMPLES AVAILABLE  WIFE STATES HE HAS NO PRESCRIPTION COVERAGE - HE WILL  NEED TO RE ESTABLISH WITH [facility]. WIFE STATE THE MEDICATION IS EXPENSIVE SHE DID NOT WANT A PRESCRIPTION CALLED INTO THE PHARMACY  MAY CALL BACK, SHE STATES HE STILL HAS SOME MEDICATION. SHE VERBALIZED UNDERSTANDING.

## 2015-10-03 ENCOUNTER — Telehealth: Payer: Self-pay | Admitting: Cardiology

## 2015-10-03 LAB — BASIC METABOLIC PANEL
BUN: 10 mg/dL (ref 7–25)
CALCIUM: 9.1 mg/dL (ref 8.6–10.3)
CO2: 24 mmol/L (ref 20–31)
CREATININE: 1.09 mg/dL (ref 0.70–1.18)
Chloride: 102 mmol/L (ref 98–110)
GLUCOSE: 183 mg/dL — AB (ref 65–99)
Potassium: 4.2 mmol/L (ref 3.5–5.3)
SODIUM: 138 mmol/L (ref 135–146)

## 2015-10-03 NOTE — Telephone Encounter (Signed)
Left msg to call. Pt had recent BMET, results OK w/e of glucose. Normal renal function.

## 2015-10-03 NOTE — Telephone Encounter (Signed)
Discussed w/ Tana Coast, RPH.  As discussed, liver function test is not a standard requirement for NOAC guidelines. Renal function via BMET is recommended.  The BMET done on 5/3 is OK for dosing. Standard is a BMET within 3 months of starting Eliquis and 6 month routine checks. This was communicated to patient, who voiced understanding and requested this be communicated to [facility] provider. I have a fax number on-hand. Will send this note along with recent lab tests to [facility] fax provided.

## 2015-10-03 NOTE — Telephone Encounter (Signed)
New message    The wife is asking to see if the patient had blood done but it looks like there was no liver test done and the [facility] needs [facility] to [facility]  the medication  Eliquis(patient already on it). The patient has liver function test scheduled for tomorrow, but before the test is done she wants to make it. The blood work done back in March , is that what she can use to send to the [place] instead of waiting for tomorrow and getting it done then (liver function).

## 2015-10-08 ENCOUNTER — Ambulatory Visit (INDEPENDENT_AMBULATORY_CARE_PROVIDER_SITE_OTHER): Payer: Self-pay

## 2015-10-08 DIAGNOSIS — I4819 Other persistent atrial fibrillation: Secondary | ICD-10-CM

## 2015-10-08 DIAGNOSIS — I481 Persistent atrial fibrillation: Secondary | ICD-10-CM

## 2015-11-15 ENCOUNTER — Encounter: Payer: Self-pay | Admitting: *Deleted

## 2015-11-21 NOTE — Progress Notes (Signed)
HPI: FU atrial flutter. In 2/17 the patient experienced fatigue and his heart rate was elevated. He was admitted to the [facility] with a diagnosis of atrial flutter. Records reviewed and show echocardiogram February 2017 showed ejection fraction 35-40%, moderate left ventricular hypertrophy, mildly reduced RV function, moderate left atrial enlargement, mild tricuspid regurgitation and moderate elevation in pulmonary pressures. Chest CT showed atherosclerotic calcifications in the coronaries. He was placed on metoprolol and pradaxa. Pradaxa was DCed due to nausea. TSH 08/28/15 normal; hgb 16.3, glucose 159. Placed on apixaban previously. Had TEE 09/04/15 that showed EF 10-15, severe BAE, mild RVE with severely reduced RV function; had successful DCCV at that time. At last ov, noted to be in atrial fibrillation. FU holter showed sinus with pacs and pvcs. Since last seen, the patient has dyspnea with more extreme activities but not with routine activities. It is relieved with rest. It is not associated with chest pain. There is no orthopnea, PND or pedal edema. There is no syncope or palpitations. There is no exertional chest pain. Patient feels much improved now that he is in sinus.   Current Outpatient Prescriptions  Medication Sig Dispense Refill  . apixaban (ELIQUIS) 5 MG TABS tablet Take 1 tablet (5 mg total) by mouth 2 (two) times daily. 60 tablet 6  . lisinopril (PRINIVIL,ZESTRIL) 2.5 MG tablet Take 1 tablet (2.5 mg total) by mouth daily. 90 tablet 3  . metoprolol (LOPRESSOR) 100 MG tablet Take 1 tablet (100 mg total) by mouth 2 (two) times daily.    . Multiple Vitamins-Minerals (MULTIVITAMIN WITH MINERALS) tablet Take 1 tablet by mouth daily.     No current facility-administered medications for this visit.     Past Medical History  Diagnosis Date  . Hypertension   . Dysrhythmia Atrial flutter    Past Surgical History  Procedure Laterality Date  . Toe amputation    .  Cardioversion N/A 09/04/2015    Procedure: CARDIOVERSION;  Surgeon: Skeet Latch, MD;  Location: [place];  Service: Cardiovascular;  Laterality: N/A;  . Tee without cardioversion N/A 09/04/2015    Procedure: TRANSESOPHAGEAL ECHOCARDIOGRAM (TEE);  Surgeon: Skeet Latch, MD;  Location: [facility] ENDOSCOPY;  Service: Cardiovascular;  Laterality: N/A;    Social History   Social History  . Marital Status: Married    Spouse Name: N/A  . Number of Children: 2  . Years of Education: N/A   Occupational History  . Not on file.   Social History Main Topics  . Smoking status: Former Research scientist (life sciences)  . Smokeless tobacco: Not on file  . Alcohol Use: 0.0 oz/week    0 Standard drinks or equivalent per week     Comment: Occasional  . Drug Use: Not on file  . Sexual Activity: Not on file   Other Topics Concern  . Not on file   Social History Narrative    Family History  Problem Relation Age of Onset  . Heart disease      No family history    ROS: no fevers or chills, productive cough, hemoptysis, dysphasia, odynophagia, melena, hematochezia, dysuria, hematuria, rash, seizure activity, orthopnea, PND, pedal edema, claudication. Remaining systems are negative.  Physical Exam: Well-developed well-nourished in no acute distress.  Skin is warm and dry.  HEENT is normal.  Neck is supple.  Chest is clear to auscultation with normal expansion.  Cardiovascular exam is regular rate and rhythm.  Abdominal exam nontender or distended. No masses palpated. Extremities show no edema. neuro grossly  intact  ECG Sinus rhythm at a rate of 65. Nonspecific ST changes. First-degree AV block.

## 2015-11-27 ENCOUNTER — Encounter: Payer: Self-pay | Admitting: Cardiology

## 2015-11-27 ENCOUNTER — Ambulatory Visit (INDEPENDENT_AMBULATORY_CARE_PROVIDER_SITE_OTHER): Payer: Self-pay | Admitting: Cardiology

## 2015-11-27 VITALS — BP 124/72 | HR 65 | Ht 71.5 in | Wt 263.1 lb

## 2015-11-27 DIAGNOSIS — I481 Persistent atrial fibrillation: Secondary | ICD-10-CM

## 2015-11-27 DIAGNOSIS — I2510 Atherosclerotic heart disease of native coronary artery without angina pectoris: Secondary | ICD-10-CM | POA: Insufficient documentation

## 2015-11-27 DIAGNOSIS — E785 Hyperlipidemia, unspecified: Secondary | ICD-10-CM

## 2015-11-27 DIAGNOSIS — I2583 Coronary atherosclerosis due to lipid rich plaque: Secondary | ICD-10-CM

## 2015-11-27 DIAGNOSIS — I4819 Other persistent atrial fibrillation: Secondary | ICD-10-CM

## 2015-11-27 DIAGNOSIS — I429 Cardiomyopathy, unspecified: Secondary | ICD-10-CM

## 2015-11-27 DIAGNOSIS — I10 Essential (primary) hypertension: Secondary | ICD-10-CM

## 2015-11-27 MED ORDER — PRAVASTATIN SODIUM 80 MG PO TABS: 80.0000 mg | ORAL_TABLET | Freq: Every evening | ORAL | Status: DC

## 2015-11-27 MED ORDER — LISINOPRIL 5 MG PO TABS
5.0000 mg | ORAL_TABLET | Freq: Every day | ORAL | Status: DC
Start: 2015-11-27 — End: 2016-01-27

## 2015-11-27 NOTE — Patient Instructions (Signed)
Medication Instructions:   INCREASE LISINOPRIL TO 5 MG ONCE DAILY= 2 OF THE 5 MG TABLETS ONCE DAILY  START PRAVASTATIN 80 MG ONCE DAILY  Labwork:  Your physician recommends that you return for lab work in: 4 WEEKS = DO NOT EAT PRIOR TO LAB WORK  Testing/Procedures:  Your physician has requested that you have an echocardiogram. Echocardiography is a painless test that uses sound waves to create images of your heart. It provides your doctor with information about the size and shape of your heart and how well your heart's chambers and valves are working. This procedure takes approximately one hour. There are no restrictions for this procedure. SCHEDULE IN [place]  Your physician has requested that you have a lexiscan myoview. For further information please visit [email or web address]. Please follow instruction sheet, as given.     Follow-Up:  Your physician wants you to follow-up in: [place] will receive a reminder letter in the mail two months in advance. If you don't receive a letter, please call our office to schedule the follow-up appointment.

## 2015-11-27 NOTE — Assessment & Plan Note (Addendum)
Patient has converted to sinus rhythm. Continue beta blocker. Continue apixaban.

## 2015-11-27 NOTE — Assessment & Plan Note (Signed)
Blood pressure controlled. Continue present medications. 

## 2015-11-27 NOTE — Assessment & Plan Note (Signed)
Patient with evidence of calcium in his coronaries on CT scan. No aspirin given need for anticoagulation. Add pravastatin 80 mg daily. Check lipids and liver in 4 weeks. I will also check his potassium and renal function at that time.

## 2015-11-27 NOTE — Assessment & Plan Note (Signed)
Etiology of cardiomyopathy unclear but possibly tachycardia mediated. Continue metoprolol. Increase lisinopril to 5 mg daily. In 6 weeks we will plan to repeat his echocardiogram to see if LV function has improved now that sinus has been reestablished. I will also arrange a nuclear study to screen for significant coronary disease given cardiomyopathy and previous evidence of calcium in coronaries on chest CT.

## 2015-12-02 NOTE — Addendum Note (Signed)
Addended by: Therisa Doyne on: 12/02/2015 04:42 PM   Modules accepted: Orders

## 2015-12-26 ENCOUNTER — Telehealth: Payer: Self-pay | Admitting: *Deleted

## 2015-12-26 DIAGNOSIS — E785 Hyperlipidemia, unspecified: Secondary | ICD-10-CM

## 2015-12-26 DIAGNOSIS — Z79899 Other long term (current) drug therapy: Secondary | ICD-10-CM

## 2015-12-26 LAB — BASIC METABOLIC PANEL
BUN: 15 mg/dL (ref 7–25)
CHLORIDE: 106 mmol/L (ref 98–110)
CO2: 22 mmol/L (ref 20–31)
Calcium: 9.3 mg/dL (ref 8.6–10.3)
Creat: 1.1 mg/dL (ref 0.70–1.18)
Glucose, Bld: 124 mg/dL — ABNORMAL HIGH (ref 65–99)
POTASSIUM: 4.4 mmol/L (ref 3.5–5.3)
SODIUM: 138 mmol/L (ref 135–146)

## 2015-12-26 LAB — HEPATIC FUNCTION PANEL
ALBUMIN: 4.2 g/dL (ref 3.6–5.1)
ALK PHOS: 58 U/L (ref 40–115)
ALT: 56 U/L — ABNORMAL HIGH (ref 9–46)
AST: 37 U/L — ABNORMAL HIGH (ref 10–35)
BILIRUBIN TOTAL: 1.4 mg/dL — AB (ref 0.2–1.2)
Bilirubin, Direct: 0.3 mg/dL — ABNORMAL HIGH (ref <=0.2)
Indirect Bilirubin: 1.1 mg/dL (ref 0.2–1.2)
Total Protein: 6.4 g/dL (ref 6.1–8.1)

## 2015-12-26 LAB — LIPID PANEL
CHOL/HDL RATIO: 3.1 ratio (ref <=5.0)
Cholesterol: 88 mg/dL — ABNORMAL LOW (ref 125–200)
HDL: 28 mg/dL — ABNORMAL LOW (ref >=40)
LDL CALC: 31 mg/dL (ref <130)
Triglycerides: 147 mg/dL (ref <150)
VLDL: 29 mg/dL (ref <30)

## 2015-12-26 NOTE — Telephone Encounter (Signed)
Left message for pt to call.

## 2015-12-26 NOTE — Telephone Encounter (Signed)
-----   Message from Lelon Perla, MD sent at 12/26/2015  7:10 AM EDT ----- Change pravachol to 40 mg daily, lipids and liver 4 weeks Kirk Ruths

## 2015-12-27 NOTE — Telephone Encounter (Signed)
Returned call to patient wife. Note on file stating we can speak with her.   Provided lab results.   She will have patient cut pravastatin 80mg  in half  Labs ordered & mailed to patient.

## 2015-12-27 NOTE — Telephone Encounter (Signed)
Follow-up    The pt is returning the nurses phone call from yesterday.

## 2016-01-27 ENCOUNTER — Encounter: Payer: Self-pay | Admitting: *Deleted

## 2016-01-27 ENCOUNTER — Telehealth: Payer: Self-pay | Admitting: Cardiology

## 2016-01-27 MED ORDER — CANDESARTAN CILEXETIL 4 MG PO TABS: 4.0000 mg | ORAL_TABLET | Freq: Every day | ORAL | 3 refills | Status: DC

## 2016-01-27 NOTE — Telephone Encounter (Signed)
Spoke with wife (EC)-concerned that patient is experiencing side effect from medication.  Reports dry, persistent cough for 1-1.5 months.  Reports noticing change since recent medication changes on 6/28 at OV.  Pt was started on Pravastatin 80mg  (decreased to 40mg  on 7/27) and Lisinopril was increased from 2.5mg  to 5mg .  Wife reports it may be allergies but was concerned it may be due to medication changes, reports taking allergy medication with little decrease in cough.  Advised I would sent to clinical pharmacist for recommendations.  Verbalized understanding.

## 2016-01-27 NOTE — Telephone Encounter (Addendum)
Returned call to make aware of pharmacist recommendations:  Rockne Menghini, RPH-CPP   01/27/16 10:26 AM  Note    Cough most likely from lisinopril, have him stop that and start candesartan 4 mg daily.  He should watch his BP for a couple of weeks to be sure it doesn't change significantly      Wife request to be sent a paper rx and note stating why change is being made for [facility] to [facility] medication.  Advised to monitor BP and call in 1-2 weeks with BP readings.  Letter made and paper rx mailed to patient.

## 2016-01-27 NOTE — Telephone Encounter (Signed)
Cough most likely from lisinopril, have him stop that and start candesartan 4 mg daily.  He should watch his BP for a couple of weeks to be sure it doesn't change significantly

## 2016-01-27 NOTE — Telephone Encounter (Signed)
New message     Pt c/o medication issue:  1. Name of Medication: pravastatin & metoprolol 2. How are you currently taking this medication (dosage and times per day)?     pravastatin  80-40mg    metoprolol100mg  2x day  3. Are you having a reaction (difficulty breathing--STAT)? no  4. What is your medication issue? persistent cough

## 2016-02-17 ENCOUNTER — Telehealth: Payer: Self-pay | Admitting: Cardiology

## 2016-02-17 NOTE — Telephone Encounter (Signed)
Left message for pt to call.

## 2016-02-17 NOTE — Telephone Encounter (Signed)
Christopher Benton is calling in reference to Christopher Benton Medications . Please call the home number , if no answer please call the cell ph# [phone]   Thanks

## 2016-02-18 MED ORDER — CARVEDILOL 25 MG PO TABS: 25.0000 mg | ORAL_TABLET | Freq: Two times a day (BID) | ORAL | 12 refills | Status: DC

## 2016-02-18 MED ORDER — CARVEDILOL 25 MG PO TABS: 25.0000 mg | ORAL_TABLET | Freq: Two times a day (BID) | ORAL | 3 refills | Status: DC

## 2016-02-18 NOTE — Telephone Encounter (Signed)
F/u Message  Pt wife returning RN call . Please call back to discuss

## 2016-02-18 NOTE — Telephone Encounter (Signed)
Dc metoprolol; coreg 25 mg BID Kirk Ruths

## 2016-02-18 NOTE — Telephone Encounter (Signed)
Spoke to wife, listed DPR  She notes pt has been on the metoprolol for several months, has had dose increases of the medication for his A Fib. She notes that he'd been having fatigue, and continued shortness of breath. She believes that after some experimentation with administration times of his medications, she isolated metoprolol as the med most likely to be causing this issue -   notes onset of SOB/fatigue about 30 mins after taking the metoprolol in AM, and lasting usually for about 5 hrs..   She would like to see if there is an alternative which the patient can take. Unfortunately she was not at home when I called and did not have BP or HR trends available for me.  I discussed w her continuing on the med as dosed until o/w instructed. Will see if Dr. Stanford Breed can advise on alternative bb or other recommendation.  Also notes she has stress test results from [place] performed this summer. [place] office fax #- she will send to [name] so that these can be scanned into EMR

## 2016-02-18 NOTE — Telephone Encounter (Addendum)
Spoke with pt wife, Aware of dr [name] recommendations.  New script sent to the pharmacy and script mailed to the pt to take to the [place]

## 2016-02-18 NOTE — Telephone Encounter (Signed)
Follow up      Patient wife calling to speak with nurse

## 2016-03-07 ENCOUNTER — Telehealth: Payer: Self-pay | Admitting: Cardiology

## 2016-03-07 ENCOUNTER — Emergency Department (HOSPITAL_COMMUNITY)
Admission: EM | Admit: 2016-03-07 | Discharge: 2016-03-07 | Disposition: A | Discharge status: Home / Self Care | Payer: Medicare Other | Attending: Emergency Medicine | Admitting: Emergency Medicine

## 2016-03-07 ENCOUNTER — Encounter (HOSPITAL_COMMUNITY): Payer: Self-pay | Admitting: *Deleted

## 2016-03-07 DIAGNOSIS — I2510 Atherosclerotic heart disease of native coronary artery without angina pectoris: Secondary | ICD-10-CM | POA: Insufficient documentation

## 2016-03-07 DIAGNOSIS — Z7901 Long term (current) use of anticoagulants: Secondary | ICD-10-CM | POA: Diagnosis not present

## 2016-03-07 DIAGNOSIS — K625 Hemorrhage of anus and rectum: Secondary | ICD-10-CM | POA: Diagnosis not present

## 2016-03-07 DIAGNOSIS — Z79899 Other long term (current) drug therapy: Secondary | ICD-10-CM | POA: Diagnosis not present

## 2016-03-07 DIAGNOSIS — Z87891 Personal history of nicotine dependence: Secondary | ICD-10-CM | POA: Diagnosis not present

## 2016-03-07 DIAGNOSIS — I10 Essential (primary) hypertension: Secondary | ICD-10-CM | POA: Insufficient documentation

## 2016-03-07 LAB — CBC
HCT: 49.6 % (ref 39.0–52.0)
Hemoglobin: 16 g/dL (ref 13.0–17.0)
MCH: 27.4 pg (ref 26.0–34.0)
MCHC: 32.3 g/dL (ref 30.0–36.0)
MCV: 84.8 fL (ref 78.0–100.0)
PLATELETS: 165 10*3/uL (ref 150–400)
RBC: 5.85 MIL/uL — ABNORMAL HIGH (ref 4.22–5.81)
RDW: 13.2 % (ref 11.5–15.5)
WBC: 9.4 10*3/uL (ref 4.0–10.5)

## 2016-03-07 LAB — COMPREHENSIVE METABOLIC PANEL
ALBUMIN: 4 g/dL (ref 3.5–5.0)
ALT: 30 U/L (ref 17–63)
AST: 32 U/L (ref 15–41)
Alkaline Phosphatase: 69 U/L (ref 38–126)
Anion gap: 5 (ref 5–15)
BUN: 14 mg/dL (ref 6–20)
CHLORIDE: 108 mmol/L (ref 101–111)
CO2: 25 mmol/L (ref 22–32)
CREATININE: 1.17 mg/dL (ref 0.61–1.24)
Calcium: 9.4 mg/dL (ref 8.9–10.3)
GFR calc Af Amer: >60 mL/min (ref >60)
GFR, EST NON AFRICAN AMERICAN: 59 mL/min — AB (ref >60)
Glucose, Bld: 138 mg/dL — ABNORMAL HIGH (ref 65–99)
Potassium: 5 mmol/L (ref 3.5–5.1)
SODIUM: 138 mmol/L (ref 135–145)
Total Bilirubin: 2 mg/dL — ABNORMAL HIGH (ref 0.3–1.2)
Total Protein: 6.4 g/dL — ABNORMAL LOW (ref 6.5–8.1)

## 2016-03-07 LAB — HEMOGLOBIN AND HEMATOCRIT, BLOOD
HCT: 47.9 % (ref 39.0–52.0)
Hemoglobin: 15.4 g/dL (ref 13.0–17.0)

## 2016-03-07 LAB — POC OCCULT BLOOD, ED: Fecal Occult Bld: POSITIVE — AB

## 2016-03-07 NOTE — ED Notes (Signed)
Pt ambulated with ease from the lobby to room and room to restroom.

## 2016-03-07 NOTE — ED Provider Notes (Signed)
[facility] DEPT Provider Note   CSN: [id number] Arrival date & time: 03/07/16  1043     History   Chief Complaint Chief Complaint  Patient presents with  . Rectal Bleeding    HPI Christopher Benton is a 75 y.o. male.  He noticed one episode a few months ago.  It did not continue.  He noticed another episode this am when going to the bathroom.  He noticed blood in the stool.  No large amount, no clots.   The history is provided by the patient.  Rectal Bleeding  Quality:  Bright red Amount:  Unable to specify Timing:  Intermittent Chronicity:  Recurrent Context: not constipation and not hemorrhoids   Relieved by:  Nothing Worsened by:  Nothing Associated symptoms: no abdominal pain, no fever, no hematemesis, no recent illness and no vomiting   Risk factors: anticoagulant use     Past Medical History:  Diagnosis Date  . Dysrhythmia Atrial flutter  . Hypertension     Patient Active Problem List   Diagnosis Date Noted  . CAD (coronary artery disease) 11/27/2015  . Atrial fibrillation ([place]) 09/25/2015  . Cardiomyopathy ([place]) 09/25/2015  . 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 Atrial flutter ([place]) 08/28/2015  . Essential hypertension 08/28/2015    Past Surgical History:  Procedure Laterality Date  . CARDIOVERSION N/A 09/04/2015   Procedure: CARDIOVERSION;  Surgeon: Skeet Latch, MD;  Location: [facility];  Service: Cardiovascular;  Laterality: N/A;  . TEE WITHOUT CARDIOVERSION N/A 09/04/2015   Procedure: TRANSESOPHAGEAL ECHOCARDIOGRAM (TEE);  Surgeon: Skeet Latch, MD;  Location: [facility] ENDOSCOPY;  Service: Cardiovascular;  Laterality: N/A;  . TOE AMPUTATION         Home Medications    Prior to Admission medications   Medication Sig Start Date End Date Taking? Authorizing Provider  apixaban (ELIQUIS) 5 MG TABS tablet Take 1 tablet (5 mg total) by mouth 2 (two) times daily. 08/28/15   Lelon Perla, MD  candesartan (ATACAND) 4 MG tablet Take 1 tablet (4 mg  total) by mouth daily. 01/27/16   Lelon Perla, MD  carvedilol (COREG) 25 MG tablet Take 1 tablet (25 mg total) by mouth 2 (two) times daily. 02/18/16 05/18/16  Lelon Perla, MD  Multiple Vitamins-Minerals (MULTIVITAMIN WITH MINERALS) tablet Take 1 tablet by mouth daily.    Historical Provider, MD  pravastatin (PRAVACHOL) 80 MG tablet Take 40 mg by mouth daily.    Historical Provider, MD    Family History Family History  Problem Relation Age of Onset  . Heart disease      No family history    Social History Social History  Substance Use Topics  . Smoking status: Former Research scientist (life sciences)  . Smokeless tobacco: Not on file  . Alcohol use 0.0 oz/week     Comment: Occasional     Allergies   Pradaxa [dabigatran etexilate mesylate]   Review of Systems Review of Systems  Constitutional: Negative for fever.  Gastrointestinal: Positive for hematochezia. Negative for abdominal pain, hematemesis and vomiting.  All other systems reviewed and are negative.    Physical Exam Updated Vital Signs BP 123/83 (BP Location: Right Arm)   Pulse 111   Temp 98.4 F (36.9 C) (Oral)   Resp 20   SpO2 96%   Physical Exam  Constitutional: He appears well-developed and well-nourished. No distress.  HENT:  Head: Normocephalic and atraumatic.  Right Ear: External ear normal.  Left Ear: External ear normal.  Eyes: Conjunctivae are normal. Right eye  exhibits no discharge. Left eye exhibits no discharge. No scleral icterus.  Neck: Neck supple. No tracheal deviation present.  Cardiovascular: Normal rate, regular rhythm and intact distal pulses.   Pulmonary/Chest: Effort normal and breath sounds normal. No stridor. No respiratory distress. He has no wheezes. He has no rales.  Abdominal: Soft. Bowel sounds are normal. He exhibits no distension. There is no tenderness. There is no rebound and no guarding.  Genitourinary:  Genitourinary Comments: No mass, scant pink mucus on the gloce  Musculoskeletal: He  exhibits no edema or tenderness.  Neurological: He is alert. He has normal strength. No cranial nerve deficit (no facial droop, extraocular movements intact, no slurred speech) or sensory deficit. He exhibits normal muscle tone. He displays no seizure activity. Coordination normal.  Skin: Skin is warm and dry. No rash noted.  Psychiatric: He has a normal mood and affect.  Nursing note and vitals reviewed.    ED Treatments / Results  Labs (all labs ordered are listed, but only abnormal results are displayed) Labs Reviewed  COMPREHENSIVE METABOLIC PANEL - Abnormal; Notable for the following:       Result Value   Glucose, Bld 138 (*)    Total Protein 6.4 (*)    Total Bilirubin 2.0 (*)    GFR calc non Af Amer 59 (*)    All other components within normal limits  CBC - Abnormal; Notable for the following:    RBC 5.85 (*)    All other components within normal limits  POC OCCULT BLOOD, ED    Procedures Procedures (including critical care time)   Initial Impression / Assessment and Plan / ED Course  I have reviewed the triage vital signs and the nursing notes.  Pertinent labs & imaging results that were available during my care of the patient were reviewed by me and considered in my medical decision making (see chart for details).  Clinical Course    Monitored in the emergency room. No further episodes of rectal bleeding. Hemoglobin has remained stable. It's possible he is having hemorrhoidal bleeding. I discussed the case with Dr. Silverio Decamp. She will arrange for close follow-up and outpatient colonoscopy.  I will ask patient call the office tomorrow.  I think it is safe for him to continue his request for now.  Final Clinical Impressions(s) / ED Diagnoses   Final diagnoses:  Rectal bleeding    New Prescriptions New Prescriptions   No medications on file     Dorie Rank, MD 03/07/16 1531

## 2016-03-07 NOTE — ED Notes (Signed)
Pt stable, ambulatory, states understanding of discharge instructions 

## 2016-03-07 NOTE — ED Triage Notes (Signed)
Pt reports onset this am of bright red rectal bleeding. Denies abd pain. Pt takes eliquis.

## 2016-03-07 NOTE — Telephone Encounter (Signed)
Pt's wife called he had a large amount of bright blood in his stools.  He is on Eliquis.  I have asked him to come to ER for eval.  They have agreed.

## 2016-03-16 ENCOUNTER — Telehealth: Payer: Self-pay | Admitting: Cardiology

## 2016-03-16 NOTE — Telephone Encounter (Signed)
Returned call, spoke to [place], wife/DPR. Recommendations given for heart safe OTC meds for congestion/URI symptoms. Caller voiced understanding and thanks.

## 2016-03-16 NOTE — Telephone Encounter (Signed)
New Message  Pt wife call requesting to speak with RN about giving pt a sinus medicine that would not cause reaction with other meds. Please call back to discuss

## 2016-03-20 ENCOUNTER — Telehealth: Payer: Self-pay | Admitting: Gastroenterology

## 2016-03-20 NOTE — Telephone Encounter (Signed)
-----   Message from Greggory Keen, LPN sent at [id number]/[phone]  2:55 PM EDT ----- Would you help? You can put my name by any APP appointment you need to use. ----- Message ----- From: Mauri Pole, MD Sent: 03/12/2016   5:06 PM To: Greggory Keen, LPN  Can you please schedule this patient for office visit, ok to see extender for blood per rectum. Thanks VN

## 2016-03-22 ENCOUNTER — Emergency Department (HOSPITAL_BASED_OUTPATIENT_CLINIC_OR_DEPARTMENT_OTHER): Payer: Medicare Other

## 2016-03-22 ENCOUNTER — Emergency Department (HOSPITAL_BASED_OUTPATIENT_CLINIC_OR_DEPARTMENT_OTHER)
Admission: EM | Admit: 2016-03-22 | Discharge: 2016-03-22 | Disposition: A | Discharge status: Home / Self Care | Payer: Medicare Other | Attending: Emergency Medicine | Admitting: Emergency Medicine

## 2016-03-22 ENCOUNTER — Encounter (HOSPITAL_BASED_OUTPATIENT_CLINIC_OR_DEPARTMENT_OTHER): Payer: Self-pay | Admitting: Radiology

## 2016-03-22 DIAGNOSIS — Z79899 Other long term (current) drug therapy: Secondary | ICD-10-CM | POA: Insufficient documentation

## 2016-03-22 DIAGNOSIS — I10 Essential (primary) hypertension: Secondary | ICD-10-CM | POA: Insufficient documentation

## 2016-03-22 DIAGNOSIS — Z87891 Personal history of nicotine dependence: Secondary | ICD-10-CM | POA: Insufficient documentation

## 2016-03-22 DIAGNOSIS — I2510 Atherosclerotic heart disease of native coronary artery without angina pectoris: Secondary | ICD-10-CM | POA: Diagnosis not present

## 2016-03-22 DIAGNOSIS — R600 Localized edema: Secondary | ICD-10-CM | POA: Diagnosis not present

## 2016-03-22 DIAGNOSIS — R0602 Shortness of breath: Secondary | ICD-10-CM

## 2016-03-22 DIAGNOSIS — J90 Pleural effusion, not elsewhere classified: Secondary | ICD-10-CM | POA: Insufficient documentation

## 2016-03-22 LAB — BASIC METABOLIC PANEL
Anion gap: 8 (ref 5–15)
BUN: 14 mg/dL (ref 6–20)
CO2: 23 mmol/L (ref 22–32)
Calcium: 9.1 mg/dL (ref 8.9–10.3)
Chloride: 107 mmol/L (ref 101–111)
Creatinine, Ser: 0.92 mg/dL (ref 0.61–1.24)
GFR calc Af Amer: >60 mL/min (ref >60)
GFR calc non Af Amer: >60 mL/min (ref >60)
Glucose, Bld: 137 mg/dL — ABNORMAL HIGH (ref 65–99)
Potassium: 4.1 mmol/L (ref 3.5–5.1)
Sodium: 138 mmol/L (ref 135–145)

## 2016-03-22 LAB — CBC WITH DIFFERENTIAL/PLATELET
Basophils Absolute: 0 10*3/uL (ref 0.0–0.1)
Basophils Relative: 0 %
Eosinophils Absolute: 0.2 10*3/uL (ref 0.0–0.7)
Eosinophils Relative: 2 %
HCT: 46.1 % (ref 39.0–52.0)
Hemoglobin: 15.5 g/dL (ref 13.0–17.0)
Lymphocytes Relative: 22 %
Lymphs Abs: 2.1 10*3/uL (ref 0.7–4.0)
MCH: 27.3 pg (ref 26.0–34.0)
MCHC: 33.6 g/dL (ref 30.0–36.0)
MCV: 81.2 fL (ref 78.0–100.0)
Monocytes Absolute: 0.9 10*3/uL (ref 0.1–1.0)
Monocytes Relative: 10 %
Neutro Abs: 6.2 10*3/uL (ref 1.7–7.7)
Neutrophils Relative %: 66 %
Platelets: 176 10*3/uL (ref 150–400)
RBC: 5.68 MIL/uL (ref 4.22–5.81)
RDW: 14.6 % (ref 11.5–15.5)
WBC: 9.5 10*3/uL (ref 4.0–10.5)

## 2016-03-22 LAB — BRAIN NATRIURETIC PEPTIDE: B Natriuretic Peptide: 473.3 pg/mL — ABNORMAL HIGH (ref 0.0–100.0)

## 2016-03-22 LAB — TROPONIN I: Troponin I: 0.05 ng/mL (ref <0.03)

## 2016-03-22 MED ORDER — FUROSEMIDE 10 MG/ML IJ SOLN
40.0000 mg | INTRAMUSCULAR | Status: AC
  Administered 2016-03-22: 40 mg via INTRAVENOUS
  Filled 2016-03-22: qty 4

## 2016-03-22 MED ORDER — LEVOFLOXACIN 750 MG PO TABS: 750.0000 mg | ORAL_TABLET | Freq: Every day | ORAL | 0 refills | Status: DC

## 2016-03-22 MED ORDER — DEXTROSE 5 % IV SOLN
1.0000 g | Freq: Once | INTRAVENOUS | Status: AC
  Administered 2016-03-22: 1 g via INTRAVENOUS
  Filled 2016-03-22: qty 10

## 2016-03-22 MED ORDER — FUROSEMIDE 40 MG PO TABS: 40.0000 mg | ORAL_TABLET | Freq: Every day | ORAL | 0 refills | Status: DC

## 2016-03-22 NOTE — ED Notes (Signed)
Patient resting comfortably on stretcher, wife at bedside.  Offered patient and wife something to drink.

## 2016-03-22 NOTE — ED Triage Notes (Signed)
SOB, non productive cough and bilateral ankle swelling x several months.  Speaking in complete sentences, no visible SOB.  BS clear bilaterally.

## 2016-03-22 NOTE — ED Provider Notes (Signed)
[facility] DEPT [facility] Provider Note   CSN: [id number] Arrival date & time: 03/22/16  1110     History   Chief Complaint Chief Complaint  Patient presents with  . Shortness of Breath    HPI Christopher Benton is a 75 y.o. male.  75yo M w/ PMH including CAD, A fib on Xarelto, Cardiomyopathy, HTN who p/w shortness of breath. Patient reports a history of at least 1 month of persistent shortness of breath. The shortness of breath has been gradual in onset and is worse with exertion and laying flat. He has noticed some lower extremity edema bilaterally. He denies any associated chest or abdominal pain. No significant changes in weight. He has been compliant with his medications. Wife reports that his symptoms initially were better when he was switched from metoprolol to carvedilol in September but recently the shortness of breath has been worse again. No fevers, vomiting, or diarrhea. He has had a cough for several weeks related to postnasal drip at night.   The history is provided by the patient and the spouse.  Shortness of Breath     Past Medical History:  Diagnosis Date  . Dysrhythmia Atrial flutter  . Hypertension     Patient Active Problem List   Diagnosis Date Noted  . CAD (coronary artery disease) 11/27/2015  . Atrial fibrillation ([place]) 09/25/2015  . Cardiomyopathy ([place]) 09/25/2015  . 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 Atrial flutter ([place]) 08/28/2015  . Essential hypertension 08/28/2015    Past Surgical History:  Procedure Laterality Date  . CARDIOVERSION N/A 09/04/2015   Procedure: CARDIOVERSION;  Surgeon: Skeet Latch, MD;  Location: [facility];  Service: Cardiovascular;  Laterality: N/A;  . TEE WITHOUT CARDIOVERSION N/A 09/04/2015   Procedure: TRANSESOPHAGEAL ECHOCARDIOGRAM (TEE);  Surgeon: Skeet Latch, MD;  Location[facility] ENDOSCOPY;  Service: Cardiovascular;  Laterality: N/A;  . TOE AMPUTATION         Home Medications    Prior to Admission  medications   Medication Sig Start Date End Date Taking? Authorizing Provider  apixaban (ELIQUIS) 5 MG TABS tablet Take 1 tablet (5 mg total) by mouth 2 (two) times daily. 08/28/15   Lelon Perla, MD  candesartan (ATACAND) 4 MG tablet Take 1 tablet (4 mg total) by mouth daily. Patient not taking: Reported on 03/07/2016 01/27/16   Lelon Perla, MD  carvedilol (COREG) 25 MG tablet Take 1 tablet (25 mg total) by mouth 2 (two) times daily. 02/18/16 05/18/16  Lelon Perla, MD  furosemide (LASIX) 40 MG tablet Take 1 tablet (40 mg total) by mouth daily. 03/22/16   Sharlett Iles, MD  levofloxacin (LEVAQUIN) 750 MG tablet Take 1 tablet (750 mg total) by mouth daily. 03/22/16   Sharlett Iles, MD  losartan (COZAAR) 25 MG tablet Take 25 mg by mouth daily.    Historical Provider, MD  Multiple Vitamins-Minerals (MULTIVITAMIN WITH MINERALS) tablet Take 1 tablet by mouth daily.    Historical Provider, MD  pravastatin (PRAVACHOL) 80 MG tablet Take 40 mg by mouth daily.    Historical Provider, MD    Family History Family History  Problem Relation Age of Onset  . Heart disease      No family history    Social History Social History  Substance Use Topics  . Smoking status: Former Research scientist (life sciences)  . Smokeless tobacco: Not on file  . Alcohol use 0.0 oz/week     Comment: Occasional     Allergies   Pradaxa [dabigatran etexilate mesylate]  Review of Systems Review of Systems  Respiratory: Positive for shortness of breath.    10 Systems reviewed and are negative for acute change except as noted in the HPI.   Physical Exam Updated Vital Signs BP 111/80 (BP Location: Right Arm)   Pulse (!) 58   Temp 98 F (36.7 C) (Oral)   Resp 18   Ht 5' 11.5" (1.816 m)   Wt 260 lb (117.9 kg)   SpO2 95%   BMI 35.76 kg/m   Physical Exam  Constitutional: He is oriented to person, place, and time. He appears well-developed and well-nourished. No distress.  HENT:  Head: Normocephalic and  atraumatic.  Moist mucous membranes  Eyes: Conjunctivae are normal. Pupils are equal, round, and reactive to light.  Neck: Neck supple.  Cardiovascular: Normal heart sounds.  An irregularly irregular rhythm present. Tachycardia present.   No murmur heard. Pulmonary/Chest: Effort normal. He has no wheezes.  Mildly diminished R lung base  Abdominal: Soft. Bowel sounds are normal. He exhibits no distension. There is no tenderness.  Musculoskeletal: He exhibits edema (1+ pitting edema to ankles).  Neurological: He is alert and oriented to person, place, and time.  Fluent speech  Skin: Skin is warm and dry.  Psychiatric: He has a normal mood and affect. Judgment normal.  Nursing note and vitals reviewed.    ED Treatments / Results  Labs (all labs ordered are listed, but only abnormal results are displayed) Labs Reviewed  BASIC METABOLIC PANEL - Abnormal; Notable for the following:       Result Value   Glucose, Bld 137 (*)    All other components within normal limits  TROPONIN I - Abnormal; Notable for the following:    Troponin I 0.05 (*)    All other components within normal limits  BRAIN NATRIURETIC PEPTIDE - Abnormal; Notable for the following:    B Natriuretic Peptide 473.3 (*)    All other components within normal limits  CBC WITH DIFFERENTIAL/PLATELET    EKG  EKG Interpretation  Date/Time:  Sunday March 22 2016 11:21:07 EDT Ventricular Rate:  95 PR Interval:    QRS Duration: 90 QT Interval:  416 QTC Calculation: 523 R Axis:   -127 Text Interpretation:  Atrial flutter Markedly posterior QRS axis Probable anteroseptal infarct, old Minimal ST depression, lateral leads Prolonged QT interval A fib/flutter new from previous EKG which showed sinus rhthm, however A flutter on previous tracings Confirmed by LITTLE MD, RACHEL [phone]) on 03/22/2016 11:26:17 AM       Radiology Dg Chest 2 View  Result Date: 03/22/2016 CLINICAL DATA:  Acute worsening shortness of breath,  atrial fibrillation. EXAM: CHEST  2 VIEW COMPARISON:  08/16/2015 FINDINGS: New moderate right pleural effusion with right middle and lower lobe collapse/ consolidation. Stable cardiomegaly without superimposed CHF or edema. Right upper lobe is clear. Stable left lung aeration. Stable left apical oval soft tissue shadow versus pleural abnormality compared 08/16/2015. This remains indeterminate. Trachea is midline. Atherosclerosis noted of the aorta. aa IMPRESSION: New moderate right pleural effusion with right middle and lower lobe collapse/consolidation. Underlying pneumonia or mass not excluded. Stable cardiomegaly without CHF Stable indeterminate left apical soft tissue versus pleural density. Electronically Signed   By: Jerilynn Mages.  Shick M.D.   On: 03/22/2016 11:51    Procedures Procedures (including critical care time)  Medications Ordered in ED Medications  furosemide (LASIX) injection 40 mg (40 mg Intravenous Given 03/22/16 1408)  cefTRIAXone (ROCEPHIN) 1 g in dextrose 5 % 50 mL IVPB (  0 g Intravenous Stopped 03/22/16 1553)     Initial Impression / Assessment and Plan / ED Course  I have reviewed the triage vital signs and the nursing notes.  Pertinent labs & imaging results that were available during my care of the patient were reviewed by me and considered in my medical decision making (see chart for details).  Clinical Course   PT w/ h/o A fib and cardiomyopathy, last ECHO with EF 35-45%, p/w worsening SOB and LE edema. He was awake, alert, and comfortable on exam. Heart rate was variable in the 90s to 110, atrial fibrillation. O2 sat 96% on room air. Mild BLE edema. Labs notable for troponin 0.05, reassuring CBC and BMP, BNP 473. Chest x-ray shows new right pleural effusion with right middle and lower lobe collapse/consolidation. DDx includes pneumonia, mass, or asymmetric CHF. Gave lasix and CTX.  I recommended that the patient be admitted for diuresis, antibiotics, and further workup of his  pleural effusion. The patient stated that he did not want to be admitted. I explained the risks of leaving including worsening condition. He voiced understanding but requested to follow-up with his outpatient providers this week. He is not requiring any oxygen, O2 sat 96% on room air. Gave him Lasix and Levaquin to start outpatient therapy and instructed to contact his cardiologist in the morning to schedule an appointment within 48 hours. Extensively reviewed return precautions with the patient and his wife and they voiced understanding. Educated on low salt diet. Patient discharged in satisfactory condition.  Final Clinical Impressions(s) / ED Diagnoses   Final diagnoses:  Pleural effusion  Shortness of breath    New Prescriptions New Prescriptions   FUROSEMIDE (LASIX) 40 MG TABLET    Take 1 tablet (40 mg total) by mouth daily.   LEVOFLOXACIN (LEVAQUIN) 750 MG TABLET    Take 1 tablet (750 mg total) by mouth daily.     Sharlett Iles, MD 03/22/16 [phone]

## 2016-03-22 NOTE — ED Notes (Addendum)
Patient presents to the ED from home where he lives a with his wife with complaints of shortness of breath that has become increasingly worse over past few weeks.  Patient's Metoprolol was changed to Carvedilol in September of this year.  Patient was dx with a-fib earlier this year and has been worked up extensively by cardiology in [place] and the [place].  Patient states exertion makes the shortness of breath worse.  Patient denies pain in chest or abdomen, but states his abdomen feels "bloated."  Patient denies N/V/D and fever.  On exam, patient's lung sounds with mild expiratory wheezing bilaterally, >bases.  Heart sounds, S1/S2, irregular. +1 radial and pedal pulses.  1+ pre-tibial edema.  Patient's abdomen is soft and non-tender to palpation.  Bowel sounds present.  Patient is alert and oriented to person, place, time and events.  Skin warm/dry.

## 2016-03-22 NOTE — Discharge Instructions (Signed)
Start taking Lasix and the antibiotic tomorrow morning. Contact your cardiologist for an appointment as soon as possible this week. Avoid salt in your diet. Return immediately if you have any worsening shortness of breath or chest pain.

## 2016-03-23 ENCOUNTER — Telehealth: Payer: Self-pay | Admitting: Cardiology

## 2016-03-23 MED FILL — levoFLOXacin 750 MG TABS: 750 | 5 days supply | Qty: 5 | Fill #0

## 2016-03-23 NOTE — Telephone Encounter (Signed)
Returned call. Wife heeded recommendations I gave last week on OTC sinus meds, & to go to urgent care or ED if symptoms unresolved/worse. Pt had some progression of symptoms and she states he was seen in ED w recommendation to follow up w PCP. Reaffirmed these instructions - caller voiced understanding and thanks.

## 2016-03-23 NOTE — Telephone Encounter (Signed)
Mr.Heyward wife is calling to speak with Ovid Curd because she spoke with him last week regarding giving her husband clarintine because they though he had a sinus infection. But went to the ER yesterday and was DX with pneumonia and was giving a Levofloxacine though Iv and now is discharged. She is wondering who does he follow up with his pcp or Dr.Crenshaw.

## 2016-04-12 ENCOUNTER — Emergency Department (HOSPITAL_BASED_OUTPATIENT_CLINIC_OR_DEPARTMENT_OTHER): Payer: Medicare Other

## 2016-04-12 ENCOUNTER — Encounter (HOSPITAL_BASED_OUTPATIENT_CLINIC_OR_DEPARTMENT_OTHER): Payer: Self-pay | Admitting: *Deleted

## 2016-04-12 ENCOUNTER — Inpatient Hospital Stay (HOSPITAL_BASED_OUTPATIENT_CLINIC_OR_DEPARTMENT_OTHER)
Admission: EM | Admit: 2016-04-12 | Discharge: 2016-04-18 | DRG: 292 | Disposition: A | Discharge status: Home / Self Care | Payer: Medicare Other | Attending: Internal Medicine | Admitting: Internal Medicine

## 2016-04-12 DIAGNOSIS — I2583 Coronary atherosclerosis due to lipid rich plaque: Secondary | ICD-10-CM

## 2016-04-12 DIAGNOSIS — I5032 Chronic diastolic (congestive) heart failure: Secondary | ICD-10-CM | POA: Diagnosis not present

## 2016-04-12 DIAGNOSIS — I700 Atherosclerosis of aorta: Secondary | ICD-10-CM | POA: Diagnosis present

## 2016-04-12 DIAGNOSIS — I5022 Chronic systolic (congestive) heart failure: Secondary | ICD-10-CM

## 2016-04-12 DIAGNOSIS — I484 Atypical atrial flutter: Secondary | ICD-10-CM | POA: Diagnosis present

## 2016-04-12 DIAGNOSIS — Z79899 Other long term (current) drug therapy: Secondary | ICD-10-CM | POA: Diagnosis not present

## 2016-04-12 DIAGNOSIS — N179 Acute kidney failure, unspecified: Secondary | ICD-10-CM | POA: Diagnosis present

## 2016-04-12 DIAGNOSIS — I5023 Acute on chronic systolic (congestive) heart failure: Secondary | ICD-10-CM | POA: Diagnosis present

## 2016-04-12 DIAGNOSIS — R778 Other specified abnormalities of plasma proteins: Secondary | ICD-10-CM

## 2016-04-12 DIAGNOSIS — I4892 Unspecified atrial flutter: Secondary | ICD-10-CM | POA: Diagnosis not present

## 2016-04-12 DIAGNOSIS — R7989 Other specified abnormal findings of blood chemistry: Secondary | ICD-10-CM

## 2016-04-12 DIAGNOSIS — I2510 Atherosclerotic heart disease of native coronary artery without angina pectoris: Secondary | ICD-10-CM | POA: Diagnosis present

## 2016-04-12 DIAGNOSIS — I429 Cardiomyopathy, unspecified: Secondary | ICD-10-CM | POA: Diagnosis present

## 2016-04-12 DIAGNOSIS — I481 Persistent atrial fibrillation: Secondary | ICD-10-CM | POA: Diagnosis present

## 2016-04-12 DIAGNOSIS — E669 Obesity, unspecified: Secondary | ICD-10-CM | POA: Diagnosis present

## 2016-04-12 DIAGNOSIS — I4891 Unspecified atrial fibrillation: Secondary | ICD-10-CM | POA: Diagnosis not present

## 2016-04-12 DIAGNOSIS — I110 Hypertensive heart disease with heart failure: Principal | ICD-10-CM | POA: Diagnosis present

## 2016-04-12 DIAGNOSIS — I10 Essential (primary) hypertension: Secondary | ICD-10-CM | POA: Diagnosis present

## 2016-04-12 DIAGNOSIS — E876 Hypokalemia: Secondary | ICD-10-CM | POA: Diagnosis not present

## 2016-04-12 DIAGNOSIS — I480 Paroxysmal atrial fibrillation: Secondary | ICD-10-CM | POA: Diagnosis present

## 2016-04-12 DIAGNOSIS — Z87891 Personal history of nicotine dependence: Secondary | ICD-10-CM | POA: Diagnosis not present

## 2016-04-12 DIAGNOSIS — J90 Pleural effusion, not elsewhere classified: Secondary | ICD-10-CM | POA: Diagnosis present

## 2016-04-12 DIAGNOSIS — R748 Abnormal levels of other serum enzymes: Secondary | ICD-10-CM | POA: Diagnosis not present

## 2016-04-12 DIAGNOSIS — R0602 Shortness of breath: Secondary | ICD-10-CM

## 2016-04-12 DIAGNOSIS — Z6834 Body mass index (BMI) 34.0-34.9, adult: Secondary | ICD-10-CM | POA: Diagnosis not present

## 2016-04-12 DIAGNOSIS — Z7901 Long term (current) use of anticoagulants: Secondary | ICD-10-CM

## 2016-04-12 HISTORY — DX: Atherosclerotic heart disease of native coronary artery without angina pectoris: I25.10

## 2016-04-12 HISTORY — DX: Cardiomyopathy, unspecified: I42.9

## 2016-04-12 HISTORY — DX: Chronic systolic (congestive) heart failure: I50.22

## 2016-04-12 HISTORY — DX: Unspecified atrial fibrillation: I48.91

## 2016-04-12 LAB — COMPREHENSIVE METABOLIC PANEL
ALBUMIN: 4.2 g/dL (ref 3.5–5.0)
ALK PHOS: 80 U/L (ref 38–126)
ALT: 34 U/L (ref 17–63)
AST: 33 U/L (ref 15–41)
Anion gap: 7 (ref 5–15)
BUN: 13 mg/dL (ref 6–20)
CALCIUM: 9.4 mg/dL (ref 8.9–10.3)
CHLORIDE: 104 mmol/L (ref 101–111)
CO2: 25 mmol/L (ref 22–32)
CREATININE: 1.1 mg/dL (ref 0.61–1.24)
GFR calc non Af Amer: >60 mL/min (ref >60)
GLUCOSE: 120 mg/dL — AB (ref 65–99)
Potassium: 4.1 mmol/L (ref 3.5–5.1)
SODIUM: 136 mmol/L (ref 135–145)
Total Bilirubin: 2.4 mg/dL — ABNORMAL HIGH (ref 0.3–1.2)
Total Protein: 7.2 g/dL (ref 6.5–8.1)

## 2016-04-12 LAB — CBC WITH DIFFERENTIAL/PLATELET
BASOS PCT: 0 %
Basophils Absolute: 0 10*3/uL (ref 0.0–0.1)
EOS ABS: 0.3 10*3/uL (ref 0.0–0.7)
Eosinophils Relative: 3 %
HCT: 47.6 % (ref 39.0–52.0)
HEMOGLOBIN: 15.9 g/dL (ref 13.0–17.0)
LYMPHS ABS: 2.9 10*3/uL (ref 0.7–4.0)
Lymphocytes Relative: 25 %
MCH: 26.8 pg (ref 26.0–34.0)
MCHC: 33.4 g/dL (ref 30.0–36.0)
MCV: 80.1 fL (ref 78.0–100.0)
MONO ABS: 1.1 10*3/uL — AB (ref 0.1–1.0)
MONOS PCT: 9 %
NEUTROS PCT: 63 %
Neutro Abs: 7.1 10*3/uL (ref 1.7–7.7)
Platelets: 213 10*3/uL (ref 150–400)
RBC: 5.94 MIL/uL — ABNORMAL HIGH (ref 4.22–5.81)
RDW: 15 % (ref 11.5–15.5)
WBC: 11.5 10*3/uL — ABNORMAL HIGH (ref 4.0–10.5)

## 2016-04-12 LAB — BRAIN NATRIURETIC PEPTIDE: B Natriuretic Peptide: 373.4 pg/mL — ABNORMAL HIGH (ref 0.0–100.0)

## 2016-04-12 LAB — TROPONIN I
Troponin I: 0.04 ng/mL (ref <0.03)
Troponin I: 0.05 ng/mL (ref <0.03)

## 2016-04-12 MED ORDER — PRAVASTATIN SODIUM 40 MG PO TABS
40.0000 mg | ORAL_TABLET | Freq: Every day | ORAL | Status: DC
  Administered 2016-04-13 – 2016-04-18 (×5): 40 mg via ORAL
  Filled 2016-04-12 (×6): qty 1

## 2016-04-12 MED ORDER — ONDANSETRON HCL 4 MG/2ML IJ SOLN: 4.0000 mg | Freq: Four times a day (QID) | INTRAMUSCULAR | Status: DC | PRN

## 2016-04-12 MED ORDER — SODIUM CHLORIDE 0.9% FLUSH
3.0000 mL | Freq: Two times a day (BID) | INTRAVENOUS | Status: DC
  Administered 2016-04-13: 3 mL via INTRAVENOUS
  Administered 2016-04-14: 11:00:00 via INTRAVENOUS
  Administered 2016-04-14 – 2016-04-17 (×3): 3 mL via INTRAVENOUS

## 2016-04-12 MED ORDER — SODIUM CHLORIDE 0.9% FLUSH: 3.0000 mL | INTRAVENOUS | Status: DC | PRN

## 2016-04-12 MED ORDER — LATANOPROST 0.005 % OP SOLN
1.0000 [drp] | Freq: Every day | OPHTHALMIC | Status: DC
  Administered 2016-04-13 – 2016-04-17 (×6): 1 [drp] via OPHTHALMIC
  Filled 2016-04-12: qty 2.5

## 2016-04-12 MED ORDER — CARVEDILOL 25 MG PO TABS
25.0000 mg | ORAL_TABLET | Freq: Two times a day (BID) | ORAL | Status: DC
  Administered 2016-04-12 – 2016-04-18 (×11): 25 mg via ORAL
  Filled 2016-04-12 (×12): qty 1

## 2016-04-12 MED ORDER — DORZOLAMIDE HCL 2 % OP SOLN
1.0000 [drp] | Freq: Two times a day (BID) | OPHTHALMIC | Status: DC
  Administered 2016-04-13 – 2016-04-17 (×10): 1 [drp] via OPHTHALMIC
  Filled 2016-04-12: qty 10

## 2016-04-12 MED ORDER — ADULT MULTIVITAMIN W/MINERALS CH
1.0000 | ORAL_TABLET | Freq: Every day | ORAL | Status: DC
  Administered 2016-04-13 – 2016-04-18 (×5): 1 via ORAL
  Filled 2016-04-12 (×7): qty 1

## 2016-04-12 MED ORDER — LOSARTAN POTASSIUM 25 MG PO TABS
25.0000 mg | ORAL_TABLET | Freq: Every day | ORAL | Status: DC
  Administered 2016-04-18: 12.5 mg via ORAL
  Filled 2016-04-12 (×7): qty 1

## 2016-04-12 MED ORDER — SODIUM CHLORIDE 0.9 % IV SOLN: 250.0000 mL | INTRAVENOUS | Status: DC | PRN

## 2016-04-12 MED ORDER — IOPAMIDOL (ISOVUE-370) INJECTION 76%
100.0000 mL | Freq: Once | INTRAVENOUS | Status: AC | PRN
Start: 2016-04-12 — End: 2016-04-12
  Administered 2016-04-12: 100 mL via INTRAVENOUS

## 2016-04-12 MED ORDER — ACETAMINOPHEN 325 MG PO TABS
650.0000 mg | ORAL_TABLET | ORAL | Status: DC | PRN
  Administered 2016-04-13: 650 mg via ORAL
  Filled 2016-04-12: qty 2

## 2016-04-12 MED ORDER — FUROSEMIDE 10 MG/ML IJ SOLN
40.0000 mg | Freq: Two times a day (BID) | INTRAMUSCULAR | Status: DC
  Administered 2016-04-12 – 2016-04-15 (×6): 40 mg via INTRAVENOUS
  Filled 2016-04-12 (×7): qty 4

## 2016-04-12 NOTE — H&P (Signed)
History and Physical        Hospital Admission Note Date: 04/12/2016  Patient name: Christopher Benton Medical record number: [id number] Date of birth: 03/01/1941 Age: 75 y.o. Gender: male  PCP: Pcp Not In System   Referring physician: Dr Theotis Burrow   Patient coming from: [facility]    Chief Complaint:  Shortness of breath progressively worsening in the last 1 week  HPI: Patient is a 75 year old male with CAD, atrial flutter on eliquis, cardiomyopathy with EF of 45%, hypertension who presented from home to [place] ED with shortness of breath which has been progressively worsening over the past one month. History was obtained from the patient and his wife at the bedside. Patient aborted that he was diagnosed with atrial fibrillation earlier this year and was placed on beta blocker and anti-evaluation. However in the last month he has been getting more short of breath with exertion. He went to [place] on 03/22/16 and had chest x-ray which showed new moderate right-sided pleural effusion with right middle and lower lobe collapse / consolidation. Underlying pneumonia or mass not excluded. Patient was treated with Lasix and antibiotics. He felt better for at least 2 weeks.  Subsequently over the last 1 week he has been again feeling short of breath. He also noticed worsening dyspnea with exertion, 2 pillow orthopnea and swelling in his feet and legs in the last 1 week. No significant weight gain per the patient. No chest pain.  ED course Patient returned back to [place] today with shortness of breath and worsening symptoms. O2 sats were 93-95% on room air, no acute distress. He was mildly tachycardic on my exam with normal BP. He had severely diminished breath sounds in the right lung. Chest x-ray again showed persistent right pleural effusion. Patient became  severely dyspneic on ambulating less than 100 feet. Labs WBC 11.5, Hb 15.9, hematocrit 47.6, platelets 213, BNP 373, troponin 0.04. Sodium 136, creatinine 1.1 BNP 373, troponin 0.04  CT angiogram chest showed no evidence of acute pulmonary embolism. Moderate to large right-sided pleural effusion with aortic atherosclerosis. No airspace consolidation identified.   Review of Systems: Positives marked in 'bold' Constitutional: Denies fever, chills, diaphoresis, poor appetite and fatigue.  HEENT: Denies photophobia, eye pain, redness, hearing loss, ear pain, congestion, sore throat, rhinorrhea, sneezing, mouth sores, trouble swallowing, neck pain, neck stiffness and tinnitus.   Respiratory: Please see history of present illness Cardiovascular: Denies chest pain, palpitations and leg swelling.  Gastrointestinal: Denies nausea, vomiting, abdominal pain, diarrhea, constipation, blood in stool and abdominal distention.  Genitourinary: Denies dysuria, urgency, frequency, hematuria, flank pain and difficulty urinating.  Musculoskeletal: Denies myalgias, back pain, joint swelling, arthralgias and gait problem.  Skin: Denies pallor, rash and wound.  Neurological: Denies dizziness, seizures, syncope, weakness, light-headedness, numbness and headaches.  Hematological: Denies adenopathy. Easy bruising, personal or family bleeding history  Psychiatric/Behavioral: Denies suicidal ideation, mood changes, confusion, nervousness, sleep disturbance and agitation  Past Medical History: Past Medical History:  Diagnosis Date  . Atrial fibrillation ([place])   . CAD (coronary artery disease)   . Cardiomyopathy ([place])   . Chronic systolic CHF (congestive heart failure) (  [place]) 04/12/2016  . Dysrhythmia Atrial flutter  . Hypertension     Past Surgical History:  Procedure Laterality Date  . CARDIOVERSION N/A 09/04/2015   Procedure: CARDIOVERSION;  Surgeon: Skeet Latch, MD;  Location: [facility];  Service:  Cardiovascular;  Laterality: N/A;  . TEE WITHOUT CARDIOVERSION N/A 09/04/2015   Procedure: TRANSESOPHAGEAL ECHOCARDIOGRAM (TEE);  Surgeon: Skeet Latch, MD;  Location[facility] ENDOSCOPY;  Service: Cardiovascular;  Laterality: N/A;  . TOE AMPUTATION      Medications: Prior to Admission medications   Medication Sig Start Date End Date Taking? Authorizing Provider  apixaban (ELIQUIS) 5 MG TABS tablet Take 1 tablet (5 mg total) by mouth 2 (two) times daily. 08/28/15  Yes Lelon Perla, MD  carvedilol (COREG) 25 MG tablet Take 1 tablet (25 mg total) by mouth 2 (two) times daily. 02/18/16 05/18/16 Yes Lelon Perla, MD  losartan (COZAAR) 25 MG tablet Take 25 mg by mouth daily.   Yes Historical Provider, MD  Multiple Vitamins-Minerals (MULTIVITAMIN WITH MINERALS) tablet Take 1 tablet by mouth daily.   Yes Historical Provider, MD  pravastatin (PRAVACHOL) 80 MG tablet Take 40 mg by mouth daily.   Yes Historical Provider, MD  candesartan (ATACAND) 4 MG tablet Take 1 tablet (4 mg total) by mouth daily. Patient not taking: Reported on 03/07/2016 01/27/16   Lelon Perla, MD  furosemide (LASIX) 40 MG tablet Take 1 tablet (40 mg total) by mouth daily. 03/22/16   Sharlett Iles, MD  levofloxacin (LEVAQUIN) 750 MG tablet Take 1 tablet (750 mg total) by mouth daily. 03/22/16   Sharlett Iles, MD    Allergies:   Allergies  Allergen Reactions  . Pradaxa [Dabigatran Etexilate Mesylate] Nausea Only    Social History:  reports that he has quit smoking 30 years ago. He has never used smokeless tobacco. He reports that he drinks alcohol. He reports that he does not use drugs.  Family History: Family History  Problem Relation Age of Onset  . Heart disease      No family history    Physical Exam: Blood pressure (!) 122/98, pulse (!) 111, temperature 98.2 F (36.8 C), temperature source Oral, resp. rate 18, height 5\' 11"  (1.803 m), weight 117.3 kg (258 lb 11.2 oz), SpO2 97 %. General:  Alert, awake, oriented x3, in no acute distress. HEENT: normocephalic, atraumatic, anicteric sclera, pink conjunctiva, pupils equal and reactive to light and accomodation, oropharynx clear Neck: supple, no masses or lymphadenopathy, no goiter, no bruits  Heart: Irregularly irregular, tachycardia Lungs: Decreased breath sound to midlung right side, clear to auscultation left lung Abdomen: Soft, nontender, nondistended, positive bowel sounds, no masses. Extremities: No clubbing, cyanosis, 1+ pitting edema B/L  Neuro: Grossly intact, no focal neurological deficits, strength 5/5 upper and lower extremities bilaterally Psych: alert and oriented x 3, normal mood and affect Skin: no rashes or lesions, warm and dry   LABS on Admission:  Basic Metabolic Panel:  Recent Labs Lab 04/12/16 0900  NA 136  K 4.1  CL 104  CO2 25  GLUCOSE 120*  BUN 13  CREATININE 1.10  CALCIUM 9.4   Liver Function Tests:  Recent Labs Lab 04/12/16 0900  AST 33  ALT 34  ALKPHOS 80  BILITOT 2.4*  PROT 7.2  ALBUMIN 4.2   No results for input(s): LIPASE, AMYLASE in the last 168 hours. No results for input(s): AMMONIA in the last 168 hours. CBC:  Recent Labs Lab 04/12/16 0900  WBC 11.5*  NEUTROABS 7.1  HGB 15.9  HCT 47.6  MCV 80.1  PLT 213   Cardiac Enzymes:  Recent Labs Lab 04/12/16 0900  TROPONINI 0.04*   BNP: Invalid input(s): POCBNP CBG: No results for input(s): GLUCAP in the last 168 hours.  Radiological Exams on Admission:  No results found.  *I have personally reviewed the images above*  EKG: Independently reviewed. Rate 76, sinus rhythm, wandering leads, nonspecific EKG changes   Assessment/Plan Principal Problem:   Pleural effusion on right, recurrent - CT angiogram of the chest did not show any malignancy or pulmonary embolism however patient has recurrent pleural effusion. No smoking history - Ultrasound-guided thoracentesis in a.m. NPO after midnight, continue to hold  eliquis, per wife, last dose 11/11 -  will send pleural fluid studies  - Hold off on any antibiotics unless infection or empyema - Placed on IV Lasix  Active Problems:   Essential hypertension - Restart Coreg, losartan, Lasix    Atrial fibrillation (HCC) with RVR - Patient did not have any of his medications yet, restart Coreg - Hold eliquis for thoracentesis in a.m.    CAD (coronary artery disease): Patient had a stress echo done in August 2017 which had shown EF of 41% and no reversible perfusion defects - Troponin slightly positive, however no chest pain - Repeat EKG in a.m. - 2-D echo in a.m. - Continue Coreg, Lasix, losartan   Acute on  Chronic systolic CHF (congestive heart failure) ([place]) - Placed on strict I's and O's, daily weights - Placed on IV Lasix, thoracentesis in a.m. - 2-D echo in a.m.  DVT prophylaxis: SCD   CODE STATUS: full code   Consults called: none   Family Communication: Admission, patients condition and plan of care including tests being ordered have been discussed with the patient and wife who indicates understanding and agree with the plan and Code Status  Admission status: inpatient   Disposition plan: Further plan will depend as patient's clinical course evolves and further radiologic and laboratory data become available. Likely home when ready  At the time of admission, it appears that the appropriate admission status for this patient is INPATIENT . This is judged to be reasonable and necessary in order to provide the required intensity of service to ensure the patient's safety given the presenting symptoms of shortness of breath, peripheral edema, recurrent right-sided pleural effusion, physical exam findings, and initial radiographic and laboratory data in the context of their chronic comorbidities.      Time Spent on Admission: 60 mins   Chevella Pearce M.D. [facility] 04/12/2016, 5:52 PM Pager: [phone]  If 7PM-7AM, please  contact night-coverage [email or web address] Password [email or web address]

## 2016-04-12 NOTE — Progress Notes (Signed)
received a troponin level of 0.05 used the paging system to in for the doctor

## 2016-04-12 NOTE — ED Triage Notes (Signed)
Patient and spouse states the patient was seen on 03/22/16 and diagnosed with pneumonia.  States he followed up with his PCP on the 03/23/16 and again on 03/30/16.  The visit on 03/30/16 the patient was taken off of all of the antibiotics he was prescribed.  States he was feeling better for the next 4-7 days.  Continued to have a cough.  Three days ago, the cough became productive and he progressively started to fell weak.  Contacted the PCP and was told to use OTC cough meds and return to the ED if worse.

## 2016-04-12 NOTE — Progress Notes (Signed)
75 year old male past history hypertension, atrial fibrillation/flutter, coronary artery disease, cardiomyopathy presented to [facility]. Pt was diagnosed with pna an treated on 10/22. PCP F/u on 10/23 and again on 10/30, better. Came back to ED with SOB. Has large R pleural effusion on CT. No O2 requirements. Patient's primary care doctor had failed to refer him for drainage of his right-sided pleural effusion for evaluation. In the emergency room patient had a brief run of V. tach that was not captured on EKG seen by the EDP. Patient has a mild troponemia and continued shortness of breath. Admit for cardiac evaluation and drainage of pleural effusion.

## 2016-04-12 NOTE — ED Notes (Signed)
Report given to [place]

## 2016-04-12 NOTE — ED Provider Notes (Signed)
[facility] DEPT [facility] Provider Note   CSN: [id number] Arrival date & time: 04/12/16  4825     History   Chief Complaint Chief Complaint  Patient presents with  . Shortness of Breath    HPI Christopher Benton is a 75 y.o. male.  75yo M w/ A fib on Eliquis, CAD, HTN, cardiomyopathy who p/w shortness of breath and cough. The patient presented here on 10/22 and I had the pleasure of caring for this patient on that visit. He had presented with shortness of breath and workup had shown a BNP close to 500 and pleural effusion on chest x-ray. I was concerned about possible underlying malignancy but the patient on antibiotics to treat for possible pneumonia. I have recommended admission at that time for diuresis and cardiology consultation as my differential included heart failure. The patient was unwilling to stay and thus went home with antibiotics, completing the course approximately 5 days later. I also gave him a few days of Lasix which he completed. He saw his PCP on 10/30, at which time he was feeling better and his cough had improved. PCP decided to keep him off of antibiotics. He felt well for the next 4-7 days and then 3 days ago his cough became productive again and he began having progressively worsening generalized weakness. He reports ongoing shortness of breath is worse with exertion. He has not noticed any significant changes in his weight. No shortness of breath laying flat. No chest pain, fevers, vomiting, diarrhea , or other infectious symptoms. He has been compliant with his medications.   The history is provided by the patient.    Past Medical History:  Diagnosis Date  . Atrial fibrillation ([place])   . CAD (coronary artery disease)   . Cardiomyopathy ([place])   . Dysrhythmia Atrial flutter  . Hypertension     Patient Active Problem List   Diagnosis Date Noted  . Pleural effusion on right 04/12/2016  . CAD (coronary artery disease) 11/27/2015  . Atrial fibrillation ([place])  09/25/2015  . Cardiomyopathy ([place]) 09/25/2015  . Typical atrial flutter ([place])   . Atrial flutter ([place]) 08/28/2015  . Essential hypertension 08/28/2015    Past Surgical History:  Procedure Laterality Date  . CARDIOVERSION N/A 09/04/2015   Procedure: CARDIOVERSION;  Surgeon: Skeet Latch, MD;  Location: [facility];  Service: Cardiovascular;  Laterality: N/A;  . TEE WITHOUT CARDIOVERSION N/A 09/04/2015   Procedure: TRANSESOPHAGEAL ECHOCARDIOGRAM (TEE);  Surgeon: Skeet Latch, MD;  Location: [facility] ENDOSCOPY;  Service: Cardiovascular;  Laterality: N/A;  . TOE AMPUTATION         Home Medications    Prior to Admission medications   Medication Sig Start Date End Date Taking? Authorizing Provider  apixaban (ELIQUIS) 5 MG TABS tablet Take 1 tablet (5 mg total) by mouth 2 (two) times daily. 08/28/15  Yes Lelon Perla, MD  carvedilol (COREG) 25 MG tablet Take 1 tablet (25 mg total) by mouth 2 (two) times daily. 02/18/16 05/18/16 Yes Lelon Perla, MD  losartan (COZAAR) 25 MG tablet Take 25 mg by mouth daily.   Yes Historical Provider, MD  Multiple Vitamins-Minerals (MULTIVITAMIN WITH MINERALS) tablet Take 1 tablet by mouth daily.   Yes Historical Provider, MD  pravastatin (PRAVACHOL) 80 MG tablet Take 40 mg by mouth daily.   Yes Historical Provider, MD  candesartan (ATACAND) 4 MG tablet Take 1 tablet (4 mg total) by mouth daily. Patient not taking: Reported on 03/07/2016 01/27/16   Lelon Perla, MD  furosemide (  LASIX) 40 MG tablet Take 1 tablet (40 mg total) by mouth daily. 03/22/16   Sharlett Iles, MD  levofloxacin (LEVAQUIN) 750 MG tablet Take 1 tablet (750 mg total) by mouth daily. 03/22/16   Sharlett Iles, MD    Family History Family History  Problem Relation Age of Onset  . Heart disease      No family history    Social History Social History  Substance Use Topics  . Smoking status: Former Research scientist (life sciences)  . Smokeless tobacco: Never Used  . Alcohol use 0.0  oz/week     Comment: Occasional     Allergies   Pradaxa [dabigatran etexilate mesylate]   Review of Systems Review of Systems 10 Systems reviewed and are negative for acute change except as noted in the HPI.   Physical Exam Updated Vital Signs BP 110/89 (BP Location: Left Arm)   Pulse 67   Temp 97.6 F (36.4 C) (Oral)   Resp 20   SpO2 96%   Physical Exam  Constitutional: He is oriented to person, place, and time. He appears well-developed and well-nourished. No distress.  HENT:  Head: Normocephalic and atraumatic.  Moist mucous membranes  Eyes: Conjunctivae are normal. Pupils are equal, round, and reactive to light.  Neck: Neck supple.  Cardiovascular: Regular rhythm and normal heart sounds.  Tachycardia present.   No murmur heard. Pulmonary/Chest: Effort normal. He has no wheezes.  Diminished BS R lung worse in base  Abdominal: Soft. Bowel sounds are normal. He exhibits no distension. There is no tenderness.  Musculoskeletal: He exhibits edema (mild pitting BLE).  Neurological: He is alert and oriented to person, place, and time.  Fluent speech  Skin: Skin is warm and dry.  Psychiatric: He has a normal mood and affect. Judgment normal.  Nursing note and vitals reviewed.    ED Treatments / Results  Labs (all labs ordered are listed, but only abnormal results are displayed) Labs Reviewed  COMPREHENSIVE METABOLIC PANEL - Abnormal; Notable for the following:       Result Value   Glucose, Bld 120 (*)    Total Bilirubin 2.4 (*)    All other components within normal limits  CBC WITH DIFFERENTIAL/PLATELET - Abnormal; Notable for the following:    WBC 11.5 (*)    RBC 5.94 (*)    Monocytes Absolute 1.1 (*)    All other components within normal limits  TROPONIN I - Abnormal; Notable for the following:    Troponin I 0.04 (*)    All other components within normal limits  BRAIN NATRIURETIC PEPTIDE - Abnormal; Notable for the following:    B Natriuretic Peptide 373.4  (*)    All other components within normal limits    EKG  EKG Interpretation  Date/Time:  Sunday April 12 2016 08:29:23 EST Ventricular Rate:  76 PR Interval:    QRS Duration: 101 QT Interval:  417 QTC Calculation: 469 R Axis:   -108 Text Interpretation:  Sinus rhythm Prolonged PR interval Left anterior fascicular block Borderline low voltage, extremity leads ST elevation, consider inferior injury Baseline wander in lead(s) V3 no sign Confirmed by LITTLE MD, RACHEL [phone]) on 04/12/2016 8:46:03 AM       Radiology Dg Chest 2 View  Result Date: 04/12/2016 CLINICAL DATA:  Weakness for 3 days.  Pneumonia. EXAM: CHEST  2 VIEW COMPARISON:  March 22, 2016 FINDINGS: Persistent effusion and opacity on the right primarily involving the right middle lobe and a portion of the right lower lobe.  The finding is similar to mildly improved in the interval. No other interval changes or acute abnormalities. IMPRESSION: Persistent and effusion in the right base/middle lobe. Recommend follow-up to complete resolution. Given persistence for 3 weeks, the patient may benefit from a CT scan. Electronically Signed   By: Dorise Bullion III M.D   On: 04/12/2016 09:24   Ct Angio Chest Pe W Or Wo Contrast  Result Date: 04/12/2016 CLINICAL DATA:  Evaluate for pulmonary embolus. EXAM: CT ANGIOGRAPHY CHEST WITH CONTRAST TECHNIQUE: Multidetector CT imaging of the chest was performed using the standard protocol during bolus administration of intravenous contrast. Multiplanar CT image reconstructions and MIPs were obtained to evaluate the vascular anatomy. CONTRAST:  100 cc of Isovue 370 COMPARISON:  None. FINDINGS: Cardiovascular: Satisfactory opacification of the pulmonary arteries to the segmental level. No evidence of pulmonary embolism. Normal heart size. No pericardial effusion. Aortic atherosclerosis is identified. Mediastinum/Nodes: The trachea appears patent and is midline. Normal appearance of the esophagus.  Prominent nodes are identified within the mediastinum. The largest is in the right paratracheal region measuring 11 mm. Lungs/Pleura: Moderate to large right pleural effusion is identified. There is overlying compressive type atelectasis within the right lower lobe. No airspace consolidation identified. Upper Abdomen: Left lobe of liver cyst measures 3.1 cm. Mild perihepatic ascites. 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 No aggressive lytic or sclerotic bone lesions. 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 Review of the MIP images confirms the above findings. IMPRESSION: 1. No evidence for acute pulmonary embolus. 2. Moderate to large right pleural effusion. 3. Aortic atherosclerosis Electronically Signed   By: Kerby Moors M.D.   On: 04/12/2016 10:45    Procedures Procedures (including critical care time)  Medications Ordered in ED Medications  iopamidol (ISOVUE-370) 76 % injection 100 mL (100 mLs Intravenous Contrast Given 04/12/16 1025)     Initial Impression / Assessment and Plan / ED Course  I have reviewed the triage vital signs and the nursing notes.  Pertinent labs & imaging results that were available during my care of the patient were reviewed by me and considered in my medical decision making (see chart for details).  Clinical Course     This is a pleasant 75yo M w/ extensive cardiac hx who p/w persistent SOB and cough despite completing course of antibiotics several weeks ago. I tried to admit him when he presented for the symptoms several weeks ago but he wanted to go home. He is back today because his symptoms have worsened again. He was in no acute distress at presentation. Mildly tachycardic on exam, normal blood pressure, O2 saturation 93-95% on room air. Severely diminished breath sounds right lung. Obtained above labs and chest x-ray  which showed persistent right pleural effusion. Obtained CT for better evaluation.  Labs show mildly elevated troponin at 0.04, BNP 373, WBC 11.5, total bili slightly elevated at 2.4. CT is negative for PE, does show large right pleural effusion with some mediastinal lymph nodes. I am concerned for malignancy vs atypical infection. Ambulated the patient in the ED and after walking less than 100 feet, he became severely dyspneic and I am concerned about his ability to perform ADLs including walking to the bathroom and given his severe dyspnea on exertion. Furthermore given his history of heart disease and slightly elevated troponin, I feel he needs serial troponins and consideration of cardiac causes of pleural effusion.  Ultimately, he needs an IR guided thoracentesis both for diagnosis and symptom relief. I discussed admission with [facility] hospitalist, Dr. Aggie Moats. [facility].  Final Clinical Impressions(s) / ED Diagnoses   Final diagnoses:  Pleural effusion on right  Shortness of breath  Elevated troponin I level    New Prescriptions New Prescriptions   No medications on file     Sharlett Iles, MD 04/12/16 1301

## 2016-04-12 NOTE — ED Notes (Signed)
Troponin level given to Dr. Rex Kras.

## 2016-04-12 NOTE — ED Notes (Signed)
Attempt to call report, nurse unavailable.

## 2016-04-13 ENCOUNTER — Inpatient Hospital Stay (HOSPITAL_COMMUNITY): Payer: Medicare Other

## 2016-04-13 LAB — CBC
HCT: 47.9 % (ref 39.0–52.0)
Hemoglobin: 15.8 g/dL (ref 13.0–17.0)
MCH: 27.3 pg (ref 26.0–34.0)
MCHC: 33 g/dL (ref 30.0–36.0)
MCV: 82.7 fL (ref 78.0–100.0)
PLATELETS: 199 10*3/uL (ref 150–400)
RBC: 5.79 MIL/uL (ref 4.22–5.81)
RDW: 14.4 % (ref 11.5–15.5)
WBC: 9.9 10*3/uL (ref 4.0–10.5)

## 2016-04-13 LAB — HEPATIC FUNCTION PANEL
ALK PHOS: 70 U/L (ref 38–126)
ALT: 29 U/L (ref 17–63)
AST: 27 U/L (ref 15–41)
Albumin: 4 g/dL (ref 3.5–5.0)
BILIRUBIN DIRECT: 0.4 mg/dL (ref 0.1–0.5)
BILIRUBIN INDIRECT: 2.1 mg/dL — AB (ref 0.3–0.9)
BILIRUBIN TOTAL: 2.5 mg/dL — AB (ref 0.3–1.2)
Total Protein: 6.3 g/dL — ABNORMAL LOW (ref 6.5–8.1)

## 2016-04-13 LAB — BASIC METABOLIC PANEL
ANION GAP: 9 (ref 5–15)
BUN: 11 mg/dL (ref 6–20)
CALCIUM: 9.4 mg/dL (ref 8.9–10.3)
CO2: 26 mmol/L (ref 22–32)
Chloride: 104 mmol/L (ref 101–111)
Creatinine, Ser: 1.13 mg/dL (ref 0.61–1.24)
GFR calc Af Amer: >60 mL/min (ref >60)
GLUCOSE: 115 mg/dL — AB (ref 65–99)
Potassium: 3.9 mmol/L (ref 3.5–5.1)
SODIUM: 139 mmol/L (ref 135–145)

## 2016-04-13 LAB — GRAM STAIN

## 2016-04-13 LAB — BODY FLUID CELL COUNT WITH DIFFERENTIAL
EOS FL: NONE SEEN %
Lymphs, Fluid: 45 %
Monocyte-Macrophage-Serous Fluid: 46 % — ABNORMAL LOW (ref 50–90)
NEUTROPHIL FLUID: 9 % (ref 0–25)
WBC FLUID: 412 uL (ref 0–1000)

## 2016-04-13 LAB — PROTEIN, BODY FLUID: Total protein, fluid: <3 g/dL

## 2016-04-13 LAB — GLUCOSE, SEROUS FLUID: Glucose, Fluid: 112 mg/dL

## 2016-04-13 LAB — LACTATE DEHYDROGENASE, PLEURAL OR PERITONEAL FLUID: LD FL: 61 U/L — AB (ref 3–23)

## 2016-04-13 LAB — TROPONIN I
TROPONIN I: 0.04 ng/mL — AB (ref <0.03)
Troponin I: 0.04 ng/mL (ref <0.03)

## 2016-04-13 LAB — LACTATE DEHYDROGENASE: LDH: 172 U/L (ref 98–192)

## 2016-04-13 MED ORDER — LIDOCAINE HCL 1 % IJ SOLN
INTRAMUSCULAR | Status: AC
  Administered 2016-04-13: 11:00:00
  Filled 2016-04-13: qty 20

## 2016-04-13 MED ORDER — APIXABAN 5 MG PO TABS
5.0000 mg | ORAL_TABLET | Freq: Two times a day (BID) | ORAL | Status: DC
  Administered 2016-04-13 – 2016-04-18 (×9): 5 mg via ORAL
  Filled 2016-04-13 (×2): qty 1
  Filled 2016-04-13: qty 2
  Filled 2016-04-13 (×7): qty 1

## 2016-04-13 NOTE — Progress Notes (Signed)
[facility]                                                                              Patient Demographics  Christopher Benton, is a 75 y.o. male, DOB - 02-22-1941, ZYS:[id number]  Admit date - 04/12/2016   Admitting Physician Elwin Mocha, MD  Outpatient Primary MD for the patient is Pcp Not In System  Outpatient specialists:   LOS - 1  days    Chief Complaint  Patient presents with  . 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 He went to [facility] on 03/22/16 and had chest x-ray which showed new moderate right-sided pleural effusion with right middle and lower lobe collapse / consolidation. He was treated with Lasix and antibiotics. He felt better for at least 2 weeks. Then over the last 1 week,He again felt short of breath with exertion,  2 pillow orthopnea and swelling in his feet and legs in the last 1 week. o chest pain.  CT angiogram chest showed no evidence of acute pulmonary embolism. Moderate to large right-sided pleural effusion with aortic atherosclerosis. No airspace consolidation identified.   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 No smoking history - Ultrasound-guided thoracentesis today, 1.3 L removed, studies pending  - Hold off on any antibiotics unless infection or empyema -cont IV Lasix   Acute on  Chronic systolic CHF (congestive heart failure) ([place]) - Patient already feeling better today with IV Lasix - Placed on strict I's and O's, daily weights - Placed on IV Lasix, thoracentesis today - 2-D echo  - Negative balance of 780 mL    Essential hypertension - Restart Coreg, losartan, Lasix    Atrial fibrillation (HCC) with RVR - Patient did not have any of his medications yet, restart Coreg - Hold eliquis for thoracentesis in a.m.    CAD (coronary artery disease): Patient had a stress echo done in August 2017 which had shown EF of 41% and no reversible perfusion defects - Troponin slightly positive, however no chest pain - 2-D echo  - Continue Coreg, Lasix, losartan  Code Status: Full CODE STATUS DVT Prophylaxis:  Restart eliquis  Family Communication: Discussed in detail with the patient, all imaging results, lab results explained to the patient and wife at the bedside  Disposition Plan:   Time Spent in minutes 25    minutes  Procedures:   thoracentesis  Consultants:   Cardiology  Antimicrobials:      Medications  Scheduled Meds: . carvedilol  25 mg Oral BID  . dorzolamide  1 drop Both Eyes BID  . furosemide  40 mg Intravenous Q12H  . latanoprost  1 drop Both Eyes QHS  . lidocaine      .  losartan  25 mg Oral Daily  . multivitamin with minerals  1 tablet Oral Daily  . pravastatin  40 mg Oral Daily  . sodium chloride flush  3 mL Intravenous Q12H   Continuous Infusions: PRN Meds:.sodium chloride, acetaminophen, ondansetron (ZOFRAN) IV, sodium chloride flush   Antibiotics   Anti-infectives    None        Subjective:   Christopher Benton was seen and examined today.  Feeling a lot better today, shortness of breath is improving. Patient denies dizziness, chest pain, abdominal pain, N/V/D/C, new weakness, numbess, tingling. No acute events overnight.   still has ankle edema  Objective:   Vitals:   04/12/16 2100 04/13/16 0500 04/13/16 1030 04/13/16 1101  BP: 104/63 108/82 117/85 98/69  Pulse: 75 74    Resp: 18 18    Temp: 98 F (36.7 C) 97.4 F (36.3 C)    TempSrc: Oral Oral    SpO2: 94% 94%    Weight:  116.7 kg (257 lb 4.8 oz)    Height:        Intake/Output Summary (Last 24  hours) at 04/13/16 1148 Last data filed at 04/13/16 0700  Gross per 24 hour  Intake              420 ml  Output             1200 ml  Net             -780 ml     Wt Readings from Last 3 Encounters:  04/13/16 116.7 kg (257 lb 4.8 oz)  03/22/16 117.9 kg (260 lb)  11/27/15 119.4 kg (263 lb 1.9 oz)     Exam  General: Alert and oriented x 3, NAD  HEENT:  PERRLA, EOMI, Anicteric Sclera, mucous membranes moist.   Neck: Supple, no JVD, no masses  Cardiovascular: S1 S2 auscultated, no rubs, murmurs or gallops. Regular rate and rhythm.  Respiratory: decreased breath sounds at the bases, right midlung    Gastrointestinal: Soft, nontender, nondistended, + bowel sounds  Ext: no cyanosis clubbing, per edema 1+  Neuro: AAOx3, Cr N's II- XII. Strength 5/5 upper and lower extremities bilaterally  Skin: No rashes  Psych: Normal affect and demeanor, alert and oriented x3    Data Reviewed:  I have personally reviewed following labs and imaging studies  Micro Results No results found for this or any previous visit (from the past 240 hour(s)).  Radiology Reports Dg Chest 1 View  Result Date: 04/13/2016 CLINICAL DATA:  Post right thoracentesis. EXAM: CHEST 1 VIEW COMPARISON:  04/12/2016 FINDINGS: No pneumothorax following thoracentesis. Decreasing right pleural effusion. Heart is mildly enlarged. Minimal right base atelectasis. Left lung is clear. IMPRESSION: No pneumothorax following right thoracentesis. Electronically Signed   By: Rolm Baptise M.D.   On: 04/13/2016 11:18   Dg Chest 2 View  Result Date: 04/12/2016 CLINICAL DATA:  Weakness for 3 days.  Pneumonia. EXAM: CHEST  2 VIEW COMPARISON:  March 22, 2016 FINDINGS: Persistent effusion and opacity on the right primarily involving the right middle lobe and a portion of the right lower lobe. The finding is similar to mildly improved in the interval. No other interval changes or acute abnormalities. IMPRESSION: Persistent and effusion  in the right base/middle lobe. Recommend follow-up to complete resolution. Given persistence for 3 weeks, the patient may benefit from a CT scan. Electronically Signed   By: Dorise Bullion III M.D   On: 04/12/2016 09:24   Dg Chest 2  View  Result Date: 03/22/2016 CLINICAL DATA:  Acute worsening shortness of breath, atrial fibrillation. EXAM: CHEST  2 VIEW COMPARISON:  08/16/2015 FINDINGS: New moderate right pleural effusion with right middle and lower lobe collapse/ consolidation. Stable cardiomegaly without superimposed CHF or edema. Right upper lobe is clear. Stable left lung aeration. Stable left apical oval soft tissue shadow versus pleural abnormality compared 08/16/2015. This remains indeterminate. Trachea is midline. Atherosclerosis noted of the aorta. aa IMPRESSION: New moderate right pleural effusion with right middle and lower lobe collapse/consolidation. Underlying pneumonia or mass not excluded. Stable cardiomegaly without CHF Stable indeterminate left apical soft tissue versus pleural density. Electronically Signed   By: Jerilynn Mages.  Shick M.D.   On: 03/22/2016 11:51   Ct Angio Chest Pe W Or Wo Contrast  Result Date: 04/12/2016 CLINICAL DATA:  Evaluate for pulmonary embolus. EXAM: CT ANGIOGRAPHY CHEST WITH CONTRAST TECHNIQUE: Multidetector CT imaging of the chest was performed using the standard protocol during bolus administration of intravenous contrast. Multiplanar CT image reconstructions and MIPs were obtained to evaluate the vascular anatomy. CONTRAST:  100 cc of Isovue 370 COMPARISON:  None. FINDINGS: Cardiovascular: Satisfactory opacification of the pulmonary arteries to the segmental level. No evidence of pulmonary embolism. Normal heart size. No pericardial effusion. Aortic atherosclerosis is identified. Mediastinum/Nodes: The trachea appears patent and is midline. Normal appearance of the esophagus. Prominent nodes are identified within the mediastinum. The largest is in the right  paratracheal region measuring 11 mm. Lungs/Pleura: Moderate to large right pleural effusion is identified. There is overlying compressive type atelectasis within the right lower lobe. No airspace consolidation identified. Upper Abdomen: Left lobe of liver cyst measures 3.1 cm. Mild perihepatic ascites. 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 No aggressive lytic or sclerotic bone lesions. 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 Review of the MIP images confirms the above findings. IMPRESSION: 1. No evidence for acute pulmonary embolus. 2. Moderate to large right pleural effusion. 3. Aortic atherosclerosis Electronically Signed   By: Kerby Moors M.D.   On: 04/12/2016 10:45    Lab Data:  CBC:  Recent Labs Lab 04/12/16 0900 04/13/16 0959  WBC 11.5* 9.9  NEUTROABS 7.1  --   HGB 15.9 15.8  HCT 47.6 47.9  MCV 80.1 82.7  PLT 213 465   Basic Metabolic Panel:  Recent Labs Lab 04/12/16 0900 04/13/16 0959  NA 136 139  K 4.1 3.9  CL 104 104  CO2 25 26  GLUCOSE 120* 115*  BUN 13 11  CREATININE 1.10 1.13  CALCIUM 9.4 9.4   GFR: Estimated Creatinine Clearance: 73.4 mL/min (by C-G formula based on SCr of 1.13 mg/dL). Liver Function Tests:  Recent Labs Lab 04/12/16 0900 04/13/16 0959  AST 33 27  ALT 34 29  ALKPHOS 80 70  BILITOT 2.4* 2.5*  PROT 7.2 6.3*  ALBUMIN 4.2 4.0   No results for input(s): LIPASE, AMYLASE in the last 168 hours. No results for input(s): AMMONIA in the last 168 hours. Coagulation Profile: No results for input(s): INR, PROTIME in the last 168 hours. Cardiac Enzymes:  Recent Labs Lab 04/12/16 0900 04/12/16 1802 04/12/16 2328 04/13/16 0959  TROPONINI 0.04* 0.05* 0.04* 0.04*   BNP (last 3 results) No results for input(s): PROBNP in the last 8760 hours. HbA1C: No results for input(s): HGBA1C in the last 72  hours. CBG: No results for input(s): GLUCAP in the last 168 hours.  Lipid Profile: No results for input(s): CHOL, HDL, LDLCALC, TRIG, CHOLHDL, LDLDIRECT in the last 72 hours. Thyroid Function Tests: No results for input(s): TSH, T4TOTAL, FREET4, T3FREE, THYROIDAB in the last 72 hours. Anemia Panel: No results for input(s): VITAMINB12, FOLATE, FERRITIN, TIBC, IRON, RETICCTPCT in the last 72 hours. Urine analysis: No results found for: COLORURINE, APPEARANCEUR, LABSPEC, PHURINE, GLUCOSEU, HGBUR, BILIRUBINUR, KETONESUR, PROTEINUR, UROBILINOGEN, NITRITE, Corliss Skains M.D. [facility] 04/13/2016, 11:48 AM  Pager: [phone] Between 7am to 7pm - call Pager - [phone]  After 7pm go to [email or web address] - password [email or web address]  Call night coverage person covering after 7pm

## 2016-04-13 NOTE — Progress Notes (Signed)
[facility] for apixaban Indication: nonvalvular atrial fibrillation   Assessment: 75 yom on apixaban 5mg  BID PTA for afib, held 11/12 for thoracentesis - completed 11/13. Now Pharmacy consulted to resume apixaban. PTA dose is appropriate. CBC and renal function stable, no bleeding documented.  Goal of Therapy:  stroke prevention Monitor platelets by anticoagulation protocol: Yes   Plan:  Resume PTA apixaban 5mg  PO BID Monitor CBC, s/sx bleeding   Elicia Lamp, PharmD, BCPS Clinical Pharmacist 04/13/2016 12:03 PM

## 2016-04-13 NOTE — Procedures (Signed)
Successful US guided right thoracentesis. Yielded 1.3 L of hazy yellow fluid. Pt tolerated procedure well. No immediate complications.  Specimen was sent for labs. CXR ordered.  Ascencion Dike PA-C 04/13/2016 11:06 AM

## 2016-04-14 ENCOUNTER — Inpatient Hospital Stay (HOSPITAL_COMMUNITY): Payer: Medicare Other

## 2016-04-14 DIAGNOSIS — R7989 Other specified abnormal findings of blood chemistry: Secondary | ICD-10-CM

## 2016-04-14 DIAGNOSIS — I4892 Unspecified atrial flutter: Secondary | ICD-10-CM

## 2016-04-14 DIAGNOSIS — R778 Other specified abnormalities of plasma proteins: Secondary | ICD-10-CM

## 2016-04-14 DIAGNOSIS — I4891 Unspecified atrial fibrillation: Secondary | ICD-10-CM

## 2016-04-14 DIAGNOSIS — R748 Abnormal levels of other serum enzymes: Secondary | ICD-10-CM

## 2016-04-14 DIAGNOSIS — I5032 Chronic diastolic (congestive) heart failure: Secondary | ICD-10-CM

## 2016-04-14 LAB — ECHOCARDIOGRAM COMPLETE
HEIGHTINCHES: 71 in
Weight: 4043.2 oz

## 2016-04-14 LAB — BASIC METABOLIC PANEL
ANION GAP: 9 (ref 5–15)
BUN: 14 mg/dL (ref 6–20)
CALCIUM: 8.8 mg/dL — AB (ref 8.9–10.3)
CO2: 23 mmol/L (ref 22–32)
Chloride: 106 mmol/L (ref 101–111)
Creatinine, Ser: 1.07 mg/dL (ref 0.61–1.24)
GLUCOSE: 104 mg/dL — AB (ref 65–99)
POTASSIUM: 3.8 mmol/L (ref 3.5–5.1)
Sodium: 138 mmol/L (ref 135–145)

## 2016-04-14 LAB — PH, BODY FLUID: PH, BODY FLUID: 7.8

## 2016-04-14 LAB — CBC
HEMATOCRIT: 45.7 % (ref 39.0–52.0)
HEMOGLOBIN: 15.1 g/dL (ref 13.0–17.0)
MCH: 27.2 pg (ref 26.0–34.0)
MCHC: 33 g/dL (ref 30.0–36.0)
MCV: 82.3 fL (ref 78.0–100.0)
Platelets: 176 10*3/uL (ref 150–400)
RBC: 5.55 MIL/uL (ref 4.22–5.81)
RDW: 14.4 % (ref 11.5–15.5)
WBC: 9.2 10*3/uL (ref 4.0–10.5)

## 2016-04-14 NOTE — Progress Notes (Signed)
[facility]                                                                              Patient Demographics  Christopher Benton, is a 75 y.o. male, DOB - Jan 26, 1941, LPF:[id number]  Admit date - 04/12/2016   Admitting Physician Elwin Mocha, MD  Outpatient Primary MD for the patient is Pcp Not In System  Outpatient specialists:   LOS - 2  days    Chief Complaint  Patient presents with  . 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 He went to [facility] on 03/22/16 and had chest x-ray which showed new moderate right-sided pleural effusion with right middle and lower lobe collapse / consolidation. He was treated with Lasix and antibiotics. He felt better for at least 2 weeks. Then over the last 1 week,He again felt short of breath with exertion,  2 pillow orthopnea and swelling in his feet and legs in the last 1 week. o chest pain.  CT angiogram chest showed no evidence of acute pulmonary embolism. Moderate to large right-sided pleural effusion with aortic atherosclerosis. No airspace consolidation identified.   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 No smoking history - Ultrasound-guided thoracentesis, 1.3 L removed, studies transudative - cont IV Lasix for diuresis, cardiology consulted   Acute on  Chronic systolic CHF (congestive heart failure) (HCC) - Placed on strict I's and O's, daily weights, weight down from 258 on admission -> 252 lbs today - Placed on IV Lasix, thoracentesis on 11/13 - 2-D echo still pending - Negative balance of 755 mL, cardiology  consulted    Essential hypertension - Restarted Coreg, losartan, Lasix    Atrial fibrillation ([place]) with RVR - Continue Coreg, rate controlled - Restarted eliquis after thoracentesis    CAD (coronary artery disease): Patient had a stress echo done in August 2017 which had shown EF of 41% and no reversible perfusion defects - Troponin slightly positive, however no chest pain - 2-D echo still pending - Continue Coreg, Lasix, losartan  Code Status: Full CODE STATUS DVT Prophylaxis:  Restart eliquis  Family Communication: Discussed in detail with the patient, all imaging results, lab results explained to the patient and wife at the bedside  Disposition Plan:   Time Spent in minutes 25   minutes  Procedures:   thoracentesis 11/13  Consultants:   Cardiology  Antimicrobials:      Medications  Scheduled Meds: . apixaban  5 mg Oral BID  . carvedilol  25 mg Oral BID  . dorzolamide  1 drop Both Eyes BID  . furosemide  40 mg Intravenous Q12H  . latanoprost  1 drop Both Eyes QHS  . losartan  25 mg Oral Daily  .  multivitamin with minerals  1 tablet Oral Daily  . pravastatin  40 mg Oral Daily  . sodium chloride flush  3 mL Intravenous Q12H   Continuous Infusions: PRN Meds:.sodium chloride, acetaminophen, ondansetron (ZOFRAN) IV, sodium chloride flush   Antibiotics   Anti-infectives    None        Subjective:   Juniel Groene was seen and examined today.  Feeling better today, shortness of breath is improving. Still has ankle edema. Patient denies dizziness, chest pain, abdominal pain, N/V/D/C, new weakness, numbess, tingling. No acute events overnight.    Objective:   Vitals:   04/13/16 1226 04/13/16 2106 04/14/16 0451 04/14/16 0646  BP: 93/62 117/83 (!) 85/55 (!) 101/57  Pulse: (!) 46 (!) 113 70   Resp: 19 18 20    Temp: 98.2 F (36.8 C) 98.1 F (36.7 C) 97.5 F (36.4 C)   TempSrc: Oral Oral Oral   SpO2: 95% 96% 97%   Weight:   114.6 kg (252 lb 11.2  oz)   Height:        Intake/Output Summary (Last 24 hours) at 04/14/16 2878 Last data filed at 04/14/16 6767  Gross per 24 hour  Intake              720 ml  Output              695 ml  Net               25 ml     Wt Readings from Last 3 Encounters:  04/14/16 114.6 kg (252 lb 11.2 oz)  03/22/16 117.9 kg (260 lb)  11/27/15 119.4 kg (263 lb 1.9 oz)     Exam  General: Alert and oriented x 3, NAD  HEENT:    Neck: Supple, no JVD, no masses  Cardiovascular: S1 S2 clear, RRR  Respiratory: CTA B  Gastrointestinal: Soft, nontender, nondistended, + bowel sounds  Ext: no cyanosis clubbing, Pedal edema 1+  Neuro: no new deficits  Skin: No rashes  Psych: Normal affect and demeanor, alert and oriented x3    Data Reviewed:  I have personally reviewed following labs and imaging studies  Micro Results Recent Results (from the past 240 hour(s))  Gram stain     Status: None   Collection Time: 04/13/16 11:20 AM  Result Value Ref Range Status   Specimen Description FLUID PLEURAL RIGHT  Final   Special Requests NONE  Final   Gram Stain   Final    FEW WBC PRESENT, PREDOMINANTLY MONONUCLEAR NO ORGANISMS SEEN    Report Status 04/13/2016 FINAL  Final    Radiology Reports Dg Chest 1 View  Result Date: 04/13/2016 CLINICAL DATA:  Post right thoracentesis. EXAM: CHEST 1 VIEW COMPARISON:  04/12/2016 FINDINGS: No pneumothorax following thoracentesis. Decreasing right pleural effusion. Heart is mildly enlarged. Minimal right base atelectasis. Left lung is clear. IMPRESSION: No pneumothorax following right thoracentesis. Electronically Signed   By: Rolm Baptise M.D.   On: 04/13/2016 11:18   Dg Chest 2 View  Result Date: 04/12/2016 CLINICAL DATA:  Weakness for 3 days.  Pneumonia. EXAM: CHEST  2 VIEW COMPARISON:  March 22, 2016 FINDINGS: Persistent effusion and opacity on the right primarily involving the right middle lobe and a portion of the right lower lobe. The finding is similar  to mildly improved in the interval. No other interval changes or acute abnormalities. IMPRESSION: Persistent and effusion in the right base/middle lobe. Recommend follow-up to complete resolution. Given persistence for 3 weeks,  the patient may benefit from a CT scan. Electronically Signed   By: Dorise Bullion III M.D   On: 04/12/2016 09:24   Dg Chest 2 View  Result Date: 03/22/2016 CLINICAL DATA:  Acute worsening shortness of breath, atrial fibrillation. EXAM: CHEST  2 VIEW COMPARISON:  08/16/2015 FINDINGS: New moderate right pleural effusion with right middle and lower lobe collapse/ consolidation. Stable cardiomegaly without superimposed CHF or edema. Right upper lobe is clear. Stable left lung aeration. Stable left apical oval soft tissue shadow versus pleural abnormality compared 08/16/2015. This remains indeterminate. Trachea is midline. Atherosclerosis noted of the aorta. aa IMPRESSION: New moderate right pleural effusion with right middle and lower lobe collapse/consolidation. Underlying pneumonia or mass not excluded. Stable cardiomegaly without CHF Stable indeterminate left apical soft tissue versus pleural density. Electronically Signed   By: Jerilynn Mages.  Shick M.D.   On: 03/22/2016 11:51   Ct Angio Chest Pe W Or Wo Contrast  Result Date: 04/12/2016 CLINICAL DATA:  Evaluate for pulmonary embolus. EXAM: CT ANGIOGRAPHY CHEST WITH CONTRAST TECHNIQUE: Multidetector CT imaging of the chest was performed using the standard protocol during bolus administration of intravenous contrast. Multiplanar CT image reconstructions and MIPs were obtained to evaluate the vascular anatomy. CONTRAST:  100 cc of Isovue 370 COMPARISON:  None. FINDINGS: Cardiovascular: Satisfactory opacification of the pulmonary arteries to the segmental level. No evidence of pulmonary embolism. Normal heart size. No pericardial effusion. Aortic atherosclerosis is identified. Mediastinum/Nodes: The trachea appears patent and is midline. Normal  appearance of the esophagus. Prominent nodes are identified within the mediastinum. The largest is in the right paratracheal region measuring 11 mm. Lungs/Pleura: Moderate to large right pleural effusion is identified. There is overlying compressive type atelectasis within the right lower lobe. No airspace consolidation identified. Upper Abdomen: Left lobe of liver cyst measures 3.1 cm. Mild perihepatic ascites. 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 No aggressive lytic or sclerotic bone lesions. 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 Review of the MIP images confirms the above findings. IMPRESSION: 1. No evidence for acute pulmonary embolus. 2. Moderate to large right pleural effusion. 3. Aortic atherosclerosis Electronically Signed   By: Kerby Moors M.D.   On: 04/12/2016 10:45   US Thoracentesis Asp Pleural Space W/img Guide  Result Date: 04/13/2016 INDICATION: Shortness of breath. Right-sided pleural effusion. Request thoracentesis. EXAM: ULTRASOUND GUIDED RIGHT THORACENTESIS MEDICATIONS: None. COMPLICATIONS: None immediate. Postprocedural chest x-ray negative for pneumothorax. PROCEDURE: An ultrasound guided thoracentesis was thoroughly discussed with the patient and questions answered. The benefits, risks, alternatives and complications were also discussed. The patient understands and wishes to proceed with the procedure. Written consent was obtained. Ultrasound was performed to localize and mark an adequate pocket of fluid in the right chest. The area was then prepped and draped in the normal sterile fashion. 1% Lidocaine was used for local anesthesia. Under ultrasound guidance a Safe-T-Centesis catheter was introduced. Thoracentesis was performed. The catheter was removed and a dressing applied. FINDINGS: A total of approximately 1.3 L of hazy, yellow fluid was  removed. Samples were sent to the laboratory as requested by the clinical team. IMPRESSION: Successful ultrasound guided right thoracentesis yielding 1.3 L of pleural fluid. Read by: Ascencion Dike PA-C Electronically Signed   By: Lucrezia Europe M.D.   On: 04/13/2016 11:45    Lab Data:  CBC:  Recent Labs Lab 04/12/16 0900 04/13/16 0959 04/14/16 0220  WBC  11.5* 9.9 9.2  NEUTROABS 7.1  --   --   HGB 15.9 15.8 15.1  HCT 47.6 47.9 45.7  MCV 80.1 82.7 82.3  PLT 213 199 435   Basic Metabolic Panel:  Recent Labs Lab 04/12/16 0900 04/13/16 0959 04/14/16 0220  NA 136 139 138  K 4.1 3.9 3.8  CL 104 104 106  CO2 25 26 23   GLUCOSE 120* 115* 104*  BUN 13 11 14   CREATININE 1.10 1.13 1.07  CALCIUM 9.4 9.4 8.8*   GFR: Estimated Creatinine Clearance: 76.8 mL/min (by C-G formula based on SCr of 1.07 mg/dL). Liver Function Tests:  Recent Labs Lab 04/12/16 0900 04/13/16 0959  AST 33 27  ALT 34 29  ALKPHOS 80 70  BILITOT 2.4* 2.5*  PROT 7.2 6.3*  ALBUMIN 4.2 4.0   No results for input(s): LIPASE, AMYLASE in the last 168 hours. No results for input(s): AMMONIA in the last 168 hours. Coagulation Profile: No results for input(s): INR, PROTIME in the last 168 hours. Cardiac Enzymes:  Recent Labs Lab 04/12/16 0900 04/12/16 1802 04/12/16 2328 04/13/16 0959  TROPONINI 0.04* 0.05* 0.04* 0.04*   BNP (last 3 results) No results for input(s): PROBNP in the last 8760 hours. HbA1C: No results for input(s): HGBA1C in the last 72 hours. CBG: No results for input(s): GLUCAP in the last 168 hours. Lipid Profile: No results for input(s): CHOL, HDL, LDLCALC, TRIG, CHOLHDL, LDLDIRECT in the last 72 hours. Thyroid Function Tests: No results for input(s): TSH, T4TOTAL, FREET4, T3FREE, THYROIDAB in the last 72 hours. Anemia Panel: No results for input(s): VITAMINB12, FOLATE, FERRITIN, TIBC, IRON, RETICCTPCT in the last 72 hours. Urine analysis: No results found for: COLORURINE,  APPEARANCEUR, LABSPEC, PHURINE, GLUCOSEU, HGBUR, BILIRUBINUR, KETONESUR, PROTEINUR, UROBILINOGEN, NITRITE, Corliss Skains M.D. [facility] 04/14/2016, 9:22 AM  Pager: [phone] Between 7am to 7pm - call Pager - [phone]  After 7pm go to [email or web address] - password [email or web address]  Call night coverage person covering after 7pm

## 2016-04-14 NOTE — Consult Note (Addendum)
Cardiology Consult    Patient ID: Christopher Benton MRN: [id number], DOB/AGE: March 03, 1941   Admit date: 04/12/2016 Date of Consult: 04/14/2016  Primary Physician: Pcp Not In System Primary Cardiologist: Dr. Stanford Benton Requesting Provider: Dr. Tana Benton Reason for Consultation: CHF  Patient Profile    75 yo male with PMH of atrial flutter s/p TEE/DCCV (4/17), HTN, chronic systolic HF and CAD who presented on 11/12 with progressive dyspnea and lower extremity edema over the past couple of weeks.   Past Medical History   Past Medical History:  Diagnosis Date  . Atrial fibrillation ([place])   . CAD (coronary artery disease)   . Cardiomyopathy ([place])   . Chronic systolic CHF (congestive heart failure) ([place]) 04/12/2016  . Dysrhythmia Atrial flutter  . Hypertension     Past Surgical History:  Procedure Laterality Date  . CARDIOVERSION N/A 09/04/2015   Procedure: CARDIOVERSION;  Surgeon: Christopher Latch, MD;  Location: [facility];  Service: Cardiovascular;  Laterality: N/A;  . TEE WITHOUT CARDIOVERSION N/A 09/04/2015   Procedure: TRANSESOPHAGEAL ECHOCARDIOGRAM (TEE);  Surgeon: Christopher Latch, MD;  Location: [facility] ENDOSCOPY;  Service: Cardiovascular;  Laterality: N/A;  . TOE AMPUTATION       Allergies  Allergies  Allergen Reactions  . Pradaxa [Dabigatran Etexilate Mesylate] Nausea Only    History of Present Illness    Christopher Benton is a 75 yo male with PMH of atrial flutter diag earlier this year 2/17 when he presented to the [facility]. Records noted EF of 35-40%,moderate left ventricular hypertrophy, mildly reduced RV function, moderate left atrial enlargement, mild tricuspid regurgitation and moderate elevation in pulmonary pressures. Chest CT showed calcifications in the coronaries. Was originally placed on metoprolol and pradaxa, but pradaxa was stopped due to nausea and placed on Eliquis.    Had TEE/DCCV on 09/04/15 that showed reduced EF 10-15% with severe BAE, mild RVE with severely  reduced RV function with successful conversion to SR. His was last seen in the office on 11/21/15 by Dr. Stanford Benton where he was doing well. His lisinopril was increase to 5mg  daily, with plans to repeat echo in 6 weeks. Also with noted calcifications on CT, added pravastatin 80mg  with plans for stress test. Reports having stress test done at the [place] with no noted reversible ischemia and EF estimated at 41%.   Presented to [facility] ED on 03/22/16 for ongoing dyspnea. Chest x-ray showed moderate right-sided pleural effusion with right middle/lower lobe consolidation. He was treated with lasix and antibiotics at that time. Wife states he was on lasix for about 5 days, along with the antibiotics and felt significantly better. He does not normally weigh himself at home. Reports over the past 2 weeks his breathing has worsened. He is unable to lay flat, also noticed increased edema in his lower extremities. No anginal symptoms.   Presented to the ED on 11/12 with reported symptoms. Lab showed stable electrolytes, BNP 473, trop with flat mild elevation. CXR with persistent effusion in the right base and middle lobe. Underwent CT chest with no PE, but moderate to large right pleural effusion. Had a thoracentesis with 1.3L of hazy yellow fluid removed. He was placed on IV lasix 40mg  BID on admission. Currently net - 1L. EKG shows atrial flutter with rate controlled.   Inpatient Medications    . apixaban  5 mg Oral BID  . carvedilol  25 mg Oral BID  . dorzolamide  1 drop Both Eyes BID  . furosemide  40 mg Intravenous Q12H  . latanoprost  1 drop Both Eyes QHS  . losartan  25 mg Oral Daily  . multivitamin with minerals  1 tablet Oral Daily  . pravastatin  40 mg Oral Daily  . sodium chloride flush  3 mL Intravenous Q12H    Family History    Family History  Problem Relation Age of Onset  . Heart disease      No family history    Social History    Social History   Social History  . Marital status: Married      Spouse name: N/A  . Number of children: 2  . Years of education: N/A   Occupational History  . Not on file.   Social History Main Topics  . Smoking status: Former Research scientist (life sciences)  . Smokeless tobacco: Never Used  . Alcohol use 0.0 oz/week     Comment: Occasional  . Drug use: No  . Sexual activity: Not on file   Other Topics Concern  . Not on file   Social History Narrative  . No narrative on file     Review of Systems    General:  No chills, fever, night sweats or weight changes.  Cardiovascular: See HPI Dermatological: No rash, lesions/masses Respiratory: No cough, ++ dyspnea Urologic: No hematuria, dysuria Abdominal:   No nausea, vomiting, diarrhea, bright red blood per rectum, melena, or hematemesis Neurologic:  No visual changes, wkns, changes in mental status. All other systems reviewed and are otherwise negative except as noted above.  Physical Exam    Blood pressure (!) 101/57, pulse 70, temperature 97.5 F (36.4 C), temperature source Oral, resp. rate 20, height 5\' 11"  (1.803 m), weight 252 lb 11.2 oz (114.6 kg), SpO2 97 %.  General: Pleasant older AA male, NAD Psych: Normal affect. Neuro: Alert and oriented X 3. Moves all extremities spontaneously. HEENT: Normal  Neck: Supple without bruits or JVD. Lungs:  Resp regular and unlabored, Mild crackles R>L. Heart: Irreg Irreg no s3, s4, or murmurs. Abdomen: Soft, non-tender, non-distended, BS + x 4.  Extremities: No clubbing, cyanosis, trace bilateral LE edema. DP/PT/Radials 2+ and equal bilaterally.  Labs    Troponin (Point of Care Test) No results for input(s): TROPIPOC in the last 72 hours.  Recent Labs  04/12/16 0900 04/12/16 1802 04/12/16 2328 04/13/16 0959  TROPONINI 0.04* 0.05* 0.04* 0.04*   Lab Results  Component Value Date   WBC 9.2 04/14/2016   HGB 15.1 04/14/2016   HCT 45.7 04/14/2016   MCV 82.3 04/14/2016   PLT 176 04/14/2016    Recent Labs Lab 04/13/16 0959 04/14/16 0220  NA 139 138   K 3.9 3.8  CL 104 106  CO2 26 23  BUN 11 14  CREATININE 1.13 1.07  CALCIUM 9.4 8.8*  PROT 6.3*  --   BILITOT 2.5*  --   ALKPHOS 70  --   ALT 29  --   AST 27  --   GLUCOSE 115* 104*   Lab Results  Component Value Date   CHOL 88 (L) 12/25/2015   HDL 28 (L) 12/25/2015   LDLCALC 31 12/25/2015   TRIG 147 12/25/2015   No results found for: [facility]   Radiology Studies    Dg Chest 1 View  Result Date: 04/13/2016 CLINICAL DATA:  Post right thoracentesis. EXAM: CHEST 1 VIEW COMPARISON:  04/12/2016 FINDINGS: No pneumothorax following thoracentesis. Decreasing right pleural effusion. Heart is mildly enlarged. Minimal right base atelectasis. Left lung is clear. IMPRESSION: No pneumothorax following right thoracentesis. Electronically Signed  By: Rolm Baptise M.D.   On: 04/13/2016 11:18   Dg Chest 2 View  Result Date: 04/12/2016 CLINICAL DATA:  Weakness for 3 days.  Pneumonia. EXAM: CHEST  2 VIEW COMPARISON:  March 22, 2016 FINDINGS: Persistent effusion and opacity on the right primarily involving the right middle lobe and a portion of the right lower lobe. The finding is similar to mildly improved in the interval. No other interval changes or acute abnormalities. IMPRESSION: Persistent and effusion in the right base/middle lobe. Recommend follow-up to complete resolution. Given persistence for 3 weeks, the patient may benefit from a CT scan. Electronically Signed   By: Dorise Bullion III M.D   On: 04/12/2016 09:24   Ct Angio Chest Pe W Or Wo Contrast  Result Date: 04/12/2016 CLINICAL DATA:  Evaluate for pulmonary embolus. EXAM: CT ANGIOGRAPHY CHEST WITH CONTRAST TECHNIQUE: Multidetector CT imaging of the chest was performed using the standard protocol during bolus administration of intravenous contrast. Multiplanar CT image reconstructions and MIPs were obtained to evaluate the vascular anatomy. CONTRAST:  100 cc of Isovue 370 COMPARISON:  None. FINDINGS: Cardiovascular: Satisfactory  opacification of the pulmonary arteries to the segmental level. No evidence of pulmonary embolism. Normal heart size. No pericardial effusion. Aortic atherosclerosis is identified. Mediastinum/Nodes: The trachea appears patent and is midline. Normal appearance of the esophagus. Prominent nodes are identified within the mediastinum. The largest is in the right paratracheal region measuring 11 mm. Lungs/Pleura: Moderate to large right pleural effusion is identified. There is overlying compressive type atelectasis within the right lower lobe. No airspace consolidation identified. Upper Abdomen: Left lobe of liver cyst measures 3.1 cm. Mild perihepatic ascites. 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 No aggressive lytic or sclerotic bone lesions. 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 Review of the MIP images confirms the above findings. IMPRESSION: 1. No evidence for acute pulmonary embolus. 2. Moderate to large right pleural effusion. 3. Aortic atherosclerosis Electronically Signed   By: Kerby Moors M.D.   On: 04/12/2016 10:45   US Thoracentesis Asp Pleural Space W/img Guide  Result Date: 04/13/2016 INDICATION: Shortness of breath. Right-sided pleural effusion. Request thoracentesis. EXAM: ULTRASOUND GUIDED RIGHT THORACENTESIS MEDICATIONS: None. COMPLICATIONS: None immediate. Postprocedural chest x-ray negative for pneumothorax. PROCEDURE: An ultrasound guided thoracentesis was thoroughly discussed with the patient and questions answered. The benefits, risks, alternatives and complications were also discussed. The patient understands and wishes to proceed with the procedure. Written consent was obtained. Ultrasound was performed to localize and mark an adequate pocket of fluid in the right chest. The area was then prepped and draped in the normal sterile fashion. 1%  Lidocaine was used for local anesthesia. Under ultrasound guidance a Safe-T-Centesis catheter was introduced. Thoracentesis was performed. The catheter was removed and a dressing applied. FINDINGS: A total of approximately 1.3 L of hazy, yellow fluid was removed. Samples were sent to the laboratory as requested by the clinical team. IMPRESSION: Successful ultrasound guided right thoracentesis yielding 1.3 L of pleural fluid. Read by: Ascencion Dike PA-C Electronically Signed   By: Lucrezia Europe M.D.   On: 04/13/2016 11:45    ECG & Cardiac Imaging    EKG: Atrial Flutter  Echo: 04/14/16  Study Conclusions  - Left ventricle: The cavity size was normal. Wall thickness was   increased in a pattern of moderate LVH. Systolic function was   moderately reduced. The estimated ejection  fraction was in the   range of 35% to 40%. Diffuse hypokinesis. Doppler parameters are   consistent with high ventricular filling pressure. - Left atrium: The atrium was mildly dilated. - Right ventricle: The cavity size was mildly dilated. - Right atrium: The atrium was moderately dilated. - Pericardium, extracardiac: A trivial pericardial effusion was   identified.  Impressions:  - Moderate global reduction in LV systolic function; moderate LVH;   elevated LV filling pressure; biatrial enlargement; RV not well   visualized but appears mildly dilated.  Assessment & Plan    74 yo male with PMH of atrial flutter s/p TEE/DCCV (4/17), HTN, chronic systolic HF and CAD who presented on 11/12 with progressive dyspnea and lower extremity edema over the past couple of weeks.   1. Acute on Chronic systolic HF: Treated with short course of PO lasix back in October after being the ED. States he felt much better for a couple of weeks afterwards. Developed increasing dyspnea, orthopnea and lower extremity edema in the past 2 weeks. TEE Echo 4/17 showed severely reduced EF of 10-15%, but repeat this admission shows improved EF  of 35-40%. Has been on IV lasix 40mg  BID. Net -1L. Breathing has improved post thoracentesis.  -- Would continue with IV lasix today and plan to transition to PO tomorrow. I have discussed the importance of daily weights and diet. May be beneficial to have nutrition consult as he has many questions regarding this.  -- On coreg and losartan   2. Atrial Flutter: Currently in atrial flutter with rate controlled on BB. Seems that he flips from SR to atrial flutter intermittently. Had a TEE/DCCV in April this year. Would prob benefit from outpt EP consultation. -- This patients CHA2DS2-VASc Score and unadjusted Ischemic Stroke Rate (% per year) is equal to 4.8 % stroke rate/year from a score of 4 [CHF, HTN, Age > 75 -- on Eliquis  3. CAD: Had calcifications on CT chest earlier this year. Underwent a stress test at the [place], with no noted ischemia. No reports of anginal symptoms. No ASA as he is on Eliquis.  -- on statin  Weston Brass Reino Bellis, NP-C Pager [phone] 04/14/2016, 10:26 AM  Patient seen, examined. Available data reviewed. Agree with findings, assessment, and plan as outlined by Reino Bellis, NP-C. On exam, pt is alert, oriented, in NAD. JVP elevated, lungs CTA, heart irregular without murmur or gallop, abdomen soft, NT, extremities without edema.   Chart and records reviewed. Pt with atrial flutter and hx of atrial fibrillation. CHADS-Vasc = 4.  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 Now with CHF/volume overload, pleural effusion. Clinically improved with thoracentesis and diuresis. Review of old EKG's shows atrial flutter and EKG from 09-25-15 shows atrial fibrillation. Pt appropriately anticoagulated with Eliquis. Agree with change of his beta-blocker to carvedilol. I think maintenance of sinus rhythm would be beneficial in this patient with recurrent CHF. Will ask EP service to evaluate him for treatment options of AAD Rx versus  catheter ablation. Pt and wife understand plan - questions answered. He is still working and would like to pursue any therapies that will help him maintain his functional status.   Sherren Mocha, M.D. 04/14/2016 12:54 PM

## 2016-04-14 NOTE — Care Management Note (Signed)
Case Management Note Marvetta Gibbons RN, BSN Unit 2W-Case Manager [phone]  Patient Details  Name: Christopher Benton MRN: [id number] Date of Birth: Jan 22, 1941  Subjective/Objective:   Pt admitted with Pleural effusion on right, recurrent- s/p thoracentesis                 Action/Plan: PTA pt lived at home with spouse- CM to follow for any potential d/c needs  Expected Discharge Date:                  Expected Discharge Plan:  Home/Self Care  In-House Referral:     Discharge planning Services  CM Consult  Post Acute Care Choice:    Choice offered to:     DME Arranged:    DME Agency:     HH Arranged:    [place] Agency:     Status of Service:  In process, will continue to follow  If discussed at Long Length of Stay Meetings, dates discussed:    Additional Comments:  Dawayne Patricia, RN 04/14/2016, 10:53 AM

## 2016-04-14 NOTE — Consult Note (Signed)
ELECTROPHYSIOLOGY CONSULT NOTE    Patient ID: Christopher Benton MRN: [id number], DOB/AGE: 12-23-1940 75 y.o.  Admit date: 04/12/2016 Date of Consult: 04/14/2016  Primary Physician: Pcp Not In System Primary Cardiologist: Stanford Breed Requesting MD: Burt Knack  Reason for Consultation: atrial arrythmias  HPI:  Christopher Benton is a 75 y.o. male with a past medical history significant for persistent atrial fibrillation, atrial flutter, NICM, and hypertension.  In February of this year, he was admitted to the [facility] with atrial flutter, fatigue, and palpitations.  Echocardiogram at that time demonstrated LV dysfunction.  He was placed on rate control and anticoagulation.  He then underwent DCCV in 08/2015 but had recurrence of atrial fibrillation at office visit in April of this year.  In June, at office visit with Dr Stanford Breed, he was back in sinus rhythm with improvement in shortness of breath and fatigue.  He then developed worsening shortness of breath and edema over the couple of weeks preceding admission and presented to the ER for further evaluation.  He was found to be in coarse atrial fibrillation and fluid overloaded.  CT of chest demonstrated large right pleural effusion for which he underwent thoracentesis and 1.3L was removed. He currently feels improved from admission. He is still short of breath with exertion and has fatigue. He has not had recent chest pain, dizziness, syncope, or pre-syncope. No fevers or chills.   Echo today demonstrated EF 35-40%, diffuse hypokinesis, LA mildly dilated, RA moderately dilated, moderate LVH, trivial pericardial effusion.   Past Medical History:  Diagnosis Date  . Atrial fibrillation ([place])   . CAD (coronary artery disease)   . Cardiomyopathy ([place])   . Chronic systolic CHF (congestive heart failure) ([name]) 04/12/2016  . Dysrhythmia Atrial flutter  . Hypertension      Surgical History:  Past Surgical History:  Procedure Laterality Date  .  CARDIOVERSION N/A 09/04/2015   Procedure: CARDIOVERSION;  Surgeon: Skeet Latch, MD;  Location: [facility];  Service: Cardiovascular;  Laterality: N/A;  . TEE WITHOUT CARDIOVERSION N/A 09/04/2015   Procedure: TRANSESOPHAGEAL ECHOCARDIOGRAM (TEE);  Surgeon: Skeet Latch, MD;  Location: [facility];  Service: Cardiovascular;  Laterality: N/A;  . TOE AMPUTATION       Prescriptions Prior to Admission  Medication Sig Dispense Refill Last Dose  . apixaban (ELIQUIS) 5 MG TABS tablet Take 1 tablet (5 mg total) by mouth 2 (two) times daily. 60 tablet 6 04/11/2016 at Unknown time  . carvedilol (COREG) 25 MG tablet Take 1 tablet (25 mg total) by mouth 2 (two) times daily. 180 tablet 3 04/12/2016 at Unknown time  . dorzolamide (TRUSOPT) 2 % ophthalmic solution Place 1 drop into both eyes 2 (two) times daily.   04/12/2016 at Unknown time  . latanoprost (XALATAN) 0.005 % ophthalmic solution Place 1 drop into both eyes at bedtime.   04/11/2016 at Unknown time  . losartan (COZAAR) 25 MG tablet Take 12.5 mg by mouth daily.    04/11/2016 at Unknown time  . Multiple Vitamins-Minerals (MULTIVITAMIN WITH MINERALS) tablet Take 1 tablet by mouth daily.   04/11/2016 at Unknown time  . pravastatin (PRAVACHOL) 80 MG tablet Take 40 mg by mouth daily.   04/11/2016 at Unknown time  . candesartan (ATACAND) 4 MG tablet Take 1 tablet (4 mg total) by mouth daily. (Patient not taking: Reported on 03/07/2016) 90 tablet 3 Not Taking at Unknown time  . furosemide (LASIX) 40 MG tablet Take 1 tablet (40 mg total) by mouth daily. 5 tablet 0   .  levofloxacin (LEVAQUIN) 750 MG tablet Take 1 tablet (750 mg total) by mouth daily. 5 tablet 0     Inpatient Medications:  . apixaban  5 mg Oral BID  . carvedilol  25 mg Oral BID  . dorzolamide  1 drop Both Eyes BID  . furosemide  40 mg Intravenous Q12H  . latanoprost  1 drop Both Eyes QHS  . losartan  25 mg Oral Daily  . multivitamin with minerals  1 tablet Oral Daily  .  pravastatin  40 mg Oral Daily  . sodium chloride flush  3 mL Intravenous Q12H    Allergies:  Allergies  Allergen Reactions  . Pradaxa [Dabigatran Etexilate Mesylate] Nausea Only    Social History   Social History  . Marital status: Married    Spouse name: N/A  . Number of children: 2  . Years of education: N/A   Occupational History  . Not on file.   Social History Main Topics  . Smoking status: Former Research scientist (life sciences)  . Smokeless tobacco: Never Used  . Alcohol use 0.0 oz/week     Comment: Occasional  . Drug use: No  . Sexual activity: Not on file   Other Topics Concern  . Not on file   Social History Narrative  . No narrative on file     Family History  Problem Relation Age of Onset  . Heart disease      No family history     Review of Systems: All other systems reviewed and are otherwise negative except as noted above.  Physical Exam: Vitals:   04/13/16 2106 04/14/16 0451 04/14/16 0646 04/14/16 1238  BP: 117/83 (!) 85/55 (!) 101/57 (!) 96/59  Pulse: (!) 113 70  (!) 44  Resp: 18 20  18   Temp: 98.1 F (36.7 C) 97.5 F (36.4 C)  98 F (36.7 C)  TempSrc: Oral Oral  Oral  SpO2: 96% 97%  96%  Weight:  252 lb 11.2 oz (114.6 kg)    Height:        GEN- The patient is well appearing, alert and oriented x 3 today.   HEENT: normocephalic, atraumatic; sclera clear, conjunctiva pink; hearing intact; oropharynx clear; neck supple  Lungs- Clear to ausculation bilaterally, normal work of breathing.  No wheezes, rales, rhonchi Heart- Irregular rate and rhythm, no murmurs, rubs or gallops  GI- soft, non-tender, non-distended, bowel sounds present  Extremities- no clubbing, cyanosis, or edema; DP/PT/radial pulses 2+ bilaterally MS- no significant deformity or atrophy Skin- warm and dry, no rash or lesion Psych- euthymic mood, full affect Neuro- strength and sensation are intact  Labs:   Lab Results  Component Value Date   WBC 9.2 04/14/2016   HGB 15.1 04/14/2016    HCT 45.7 04/14/2016   MCV 82.3 04/14/2016   PLT 176 04/14/2016     Recent Labs Lab 04/13/16 0959 04/14/16 0220  NA 139 138  K 3.9 3.8  CL 104 106  CO2 26 23  BUN 11 14  CREATININE 1.13 1.07  CALCIUM 9.4 8.8*  PROT 6.3*  --   BILITOT 2.5*  --   ALKPHOS 70  --   ALT 29  --   AST 27  --   GLUCOSE 115* 104*      Radiology/Studies: Dg Chest 1 View Result Date: 04/13/2016 CLINICAL DATA:  Post right thoracentesis. EXAM: CHEST 1 VIEW COMPARISON:  04/12/2016 FINDINGS: No pneumothorax following thoracentesis. Decreasing right pleural effusion. Heart is mildly enlarged. Minimal right base atelectasis. Left lung  is clear. IMPRESSION: No pneumothorax following right thoracentesis. Electronically Signed   By: Rolm Baptise M.D.   On: 04/13/2016 11:18   OZY:YQMGNO atrial fibrillation  TELEMETRY: rate controlled atrial fibrillation and atrial flutter   Assessment/Plan: 1.  Persistent atrial fibrillation and atrial flutter The patient has persistent atrial fibrillation and atrial flutter.  He is symptomatic with fatigue and shortness of breath. With prior restoration of SR, he had significant improvement in EF and functional status.  He has previously undergone cardioversion off AAD therapy with reversion to AF.  He will require AAD therapy to maintain SR.  With LV dysfunction, options are Tikosyn and amiodarone. Would prefer to start with Tikosyn. Eliquis was held for thoracentesis this admission.  Would either need repeat TEE or 3 weeks of anticoagulation prior to starting. The patient would prefer to start Tikosyn this admission. Will plan TEE for tomorrow (no DCCV spots available), and start Tikosyn tomorrow night.  He will need DCCV on Friday if remains in AF.  QTc in SR 465msec. Update BMET and Mg in morning.  Continue Eliquis for CHADS2VASC of 4  2.  Acute on chronic systolic heart failure Exacerbated by atrial arrhythmias Would likely benefit from SR long term Continue medical  therapy  Dr Lovena Le to see later today.  Signed, Chanetta Marshall, NP 04/14/2016 1:06 PM  EP Attending  Patient seen and examined. Agree with the findings as documented above by Benjaman Pott, nurse practitioner. The patient is a 75 year old man with multiple medical problems including paroxysmal atrial fibrillation and flutter, who was admitted to the hospital with acute on chronic systolic heart failure. He is been treated with diuretic therapy and improved. The patient denies peripheral edema. He has not had syncope. He has unfortunately missed a dose or 2 of his oral anticoagulants. He is status post recent thoracentesis. The patient denies palpitations and does not know when he is out of rhythm. His exam is notable for an irregular rhythm with normal S1 and S2. I did not appreciated murmur. His PMI is enlarged. His lungs were essentially clear, the abdominal exam was soft and nontender, the extremity demonstrated no edema, and there was a nonfocal neurologic exam. ECG demonstrates atypical atrial flutter with a controlled ventricular response.  Assessment 1. Atypical atrial flutter 2. Atrial fibrillation 3. Acute on chronic systolic heart failure now compensated 4. Essential hypertension.  Discussion: I discussed the treatment options with the patient in detail. Initiation of dofetilide therapy seems reasonable at this time. If he were to fail antiarrhythmic drug therapy, then consideration for catheter ablation of both atrial fibrillation and flutter would be reasonable. He will require 3 additional days in the hospital to follow his QT interval with initiation of dofetilide.  [place], M.D.

## 2016-04-15 ENCOUNTER — Encounter (HOSPITAL_COMMUNITY)
Admission: EM | Disposition: A | Discharge status: Home / Self Care | Payer: Self-pay | Source: Home / Self Care | Attending: Internal Medicine

## 2016-04-15 ENCOUNTER — Inpatient Hospital Stay (HOSPITAL_COMMUNITY): Payer: Medicare Other

## 2016-04-15 ENCOUNTER — Encounter (HOSPITAL_COMMUNITY): Payer: Self-pay

## 2016-04-15 DIAGNOSIS — I4891 Unspecified atrial fibrillation: Secondary | ICD-10-CM

## 2016-04-15 DIAGNOSIS — I484 Atypical atrial flutter: Secondary | ICD-10-CM

## 2016-04-15 HISTORY — PX: TEE WITHOUT CARDIOVERSION: SHX5443

## 2016-04-15 LAB — BASIC METABOLIC PANEL
ANION GAP: 9 (ref 5–15)
BUN: 12 mg/dL (ref 6–20)
CO2: 28 mmol/L (ref 22–32)
Calcium: 9 mg/dL (ref 8.9–10.3)
Chloride: 103 mmol/L (ref 101–111)
Creatinine, Ser: 1.26 mg/dL — ABNORMAL HIGH (ref 0.61–1.24)
GFR calc Af Amer: >60 mL/min (ref >60)
GFR, EST NON AFRICAN AMERICAN: 54 mL/min — AB (ref >60)
GLUCOSE: 97 mg/dL (ref 65–99)
POTASSIUM: 4 mmol/L (ref 3.5–5.1)
Sodium: 140 mmol/L (ref 135–145)

## 2016-04-15 LAB — MAGNESIUM: Magnesium: 2 mg/dL (ref 1.7–2.4)

## 2016-04-15 LAB — CBC
HEMATOCRIT: 46.3 % (ref 39.0–52.0)
HEMOGLOBIN: 15.2 g/dL (ref 13.0–17.0)
MCH: 27.2 pg (ref 26.0–34.0)
MCHC: 32.8 g/dL (ref 30.0–36.0)
MCV: 82.8 fL (ref 78.0–100.0)
Platelets: 182 10*3/uL (ref 150–400)
RBC: 5.59 MIL/uL (ref 4.22–5.81)
RDW: 13.9 % (ref 11.5–15.5)
WBC: 10.6 10*3/uL — ABNORMAL HIGH (ref 4.0–10.5)

## 2016-04-15 SURGERY — ECHOCARDIOGRAM, TRANSESOPHAGEAL
Anesthesia: Moderate Sedation

## 2016-04-15 MED ORDER — FUROSEMIDE 40 MG PO TABS
40.0000 mg | ORAL_TABLET | Freq: Two times a day (BID) | ORAL | Status: DC
  Administered 2016-04-15 – 2016-04-18 (×6): 40 mg via ORAL
  Filled 2016-04-15 (×7): qty 1

## 2016-04-15 MED ORDER — DOFETILIDE 250 MCG PO CAPS
500.0000 ug | ORAL_CAPSULE | Freq: Two times a day (BID) | ORAL | Status: DC
  Administered 2016-04-15 – 2016-04-18 (×7): 500 ug via ORAL
  Filled 2016-04-15 (×7): qty 2

## 2016-04-15 MED ORDER — SODIUM CHLORIDE 0.9% FLUSH
3.0000 mL | Freq: Two times a day (BID) | INTRAVENOUS | Status: DC
  Administered 2016-04-16: 3 mL via INTRAVENOUS

## 2016-04-15 MED ORDER — SODIUM CHLORIDE 0.9% FLUSH: 3.0000 mL | INTRAVENOUS | Status: DC | PRN

## 2016-04-15 MED ORDER — SODIUM CHLORIDE 0.9 % IV SOLN: 250.0000 mL | INTRAVENOUS | Status: DC | PRN

## 2016-04-15 MED ORDER — FENTANYL CITRATE (PF) 100 MCG/2ML IJ SOLN
INTRAMUSCULAR | Status: DC | PRN
Start: 2016-04-15 — End: 2016-04-15
  Administered 2016-04-15: 25 ug via INTRAVENOUS
  Administered 2016-04-15: 50 ug via INTRAVENOUS

## 2016-04-15 MED ORDER — MIDAZOLAM HCL 5 MG/ML IJ SOLN
INTRAMUSCULAR | Status: AC
  Filled 2016-04-15: qty 2

## 2016-04-15 MED ORDER — FENTANYL CITRATE (PF) 100 MCG/2ML IJ SOLN
INTRAMUSCULAR | Status: AC
  Filled 2016-04-15: qty 2

## 2016-04-15 MED ORDER — BUTAMBEN-TETRACAINE-BENZOCAINE 2-2-14 % EX AERO
INHALATION_SPRAY | CUTANEOUS | Status: DC | PRN
  Administered 2016-04-15: 2 via TOPICAL

## 2016-04-15 MED ORDER — MIDAZOLAM HCL 10 MG/2ML IJ SOLN
INTRAMUSCULAR | Status: DC | PRN
  Administered 2016-04-15 (×2): 2 mg via INTRAVENOUS

## 2016-04-15 MED ORDER — SODIUM CHLORIDE 0.9 % IV SOLN
INTRAVENOUS | Status: DC
  Administered 2016-04-15: 09:00:00 via INTRAVENOUS

## 2016-04-15 NOTE — Progress Notes (Deleted)
  Echocardiogram 2D Echocardiogram has been performed.  Donata Clay 04/15/2016, 10:36 AM

## 2016-04-15 NOTE — Interval H&P Note (Signed)
History and Physical Interval Note:  04/15/2016 9:28 AM  Christopher Benton  has presented today for surgery, with the diagnosis of atrial fibrillation  The various methods of treatment have been discussed with the patient and family. After consideration of risks, benefits and other options for treatment, the patient has consented to  Procedure(s): TRANSESOPHAGEAL ECHOCARDIOGRAM (TEE) (N/A) as a surgical intervention .  The patient's history has been reviewed, patient examined, no change in status, stable for surgery.  I have reviewed the patient's chart and labs.  Questions were answered to the patient's satisfaction.     [employer]

## 2016-04-15 NOTE — Care Management Note (Signed)
Case Management Note Marvetta Gibbons RN, BSN Unit 2W-Case Manager [phone]  Patient Details  Name: FIDENCIO DUDDY MRN: [id number] Date of Birth: 15-Oct-1940  Subjective/Objective:   Pt admitted with Pleural effusion on right, recurrent- s/p thoracentesis                 Action/Plan: PTA pt lived at home with spouse- CM to follow for any potential d/c needs  Expected Discharge Date:                  Expected Discharge Plan:  Home/Self Care  In-House Referral:     Discharge planning Services  CM Consult, Medication Assistance  Post Acute Care Choice:    Choice offered to:     DME Arranged:    DME Agency:     HH Arranged:    [place] Agency:     Status of Service:  In process, will continue to follow  If discussed at Long Length of Stay Meetings, dates discussed:    Additional Comments:  04/15/16- 1430- Marvetta Gibbons RN, CM- referral received for Tikosyn assistance- attempted benefits check- unable to find prescription coverage- spoke with pt and wife at bedside- per conversation pt states that he does not have part D or any type of prescription coverage- pt reports that he uses the [place] for medication needs- [facility]-(all new drugs will need to be approved through the PCP at the [facility]) pt goes to Dr. [name] at the [facility]- pt will need a 14 day supply of Tikosyn at time of discharge. Pt will need 14 day supply  from [place] to allow time for [facility] to [facility] drug. Call made to Noel Gerold at [facility]- discussed pt's need for Tikosyn approval- faxed needed notes to PCP- Dr. Romero Liner at [phone]- via Epic- will [facility] with [facility] prior to [facility].   Dawayne Patricia, RN 04/15/2016, 2:41 PM

## 2016-04-15 NOTE — H&P (View-Only) (Signed)
SUBJECTIVE: The patient is doing well today.  At this time, he denies chest pain, shortness of breath, or any new concerns.  CURRENT MEDICATIONS: . apixaban  5 mg Oral BID  . carvedilol  25 mg Oral BID  . dorzolamide  1 drop Both Eyes BID  . furosemide  40 mg Intravenous Q12H  . latanoprost  1 drop Both Eyes QHS  . losartan  25 mg Oral Daily  . multivitamin with minerals  1 tablet Oral Daily  . pravastatin  40 mg Oral Daily  . sodium chloride flush  3 mL Intravenous Q12H     OBJECTIVE: Physical Exam: Vitals:   04/14/16 0646 04/14/16 1238 04/14/16 1925 04/15/16 0505  BP: (!) 101/57 (!) 96/59 109/72 109/81  Pulse:  (!) 44 75 71  Resp:  18 20 20   Temp:  98 F (36.7 C) 97.4 F (36.3 C) 98 F (36.7 C)  TempSrc:  Oral Oral Oral  SpO2:  96% 96% 98%  Weight:    250 lb 4.8 oz (113.5 kg)  Height:        Intake/Output Summary (Last 24 hours) at 04/15/16 2563 Last data filed at 04/15/16 0600  Gross per 24 hour  Intake              960 ml  Output             1550 ml  Net             -590 ml    Telemetry reveals rate controlled atypical atrial flutter  GEN- The patient is well appearing, alert and oriented x 3 today.   Head- normocephalic, atraumatic Eyes-  Sclera clear, conjunctiva pink Ears- hearing intact Oropharynx- clear Neck- supple  Lungs- Clear to ausculation bilaterally, normal work of breathing Heart- Irregular rate and rhythm  GI- soft, NT, ND, + BS Extremities- no clubbing, cyanosis, or edema Skin- no rash or lesion Psych- euthymic mood, full affect Neuro- strength and sensation are intact  LABS: Basic Metabolic Panel:  Recent Labs  04/14/16 0220 04/15/16 0322  NA 138 140  K 3.8 4.0  CL 106 103  CO2 23 28  GLUCOSE 104* 97  BUN 14 12  CREATININE 1.07 1.26*  CALCIUM 8.8* 9.0   Liver Function Tests:  Recent Labs  04/12/16 0900 04/13/16 0959  AST 33 27  ALT 34 29  ALKPHOS 80 70  BILITOT 2.4* 2.5*  PROT 7.2 6.3*  ALBUMIN 4.2 4.0    CBC:  Recent Labs  04/12/16 0900  04/14/16 0220 04/15/16 0322  WBC 11.5*  < > 9.2 10.6*  NEUTROABS 7.1  --   --   --   HGB 15.9  < > 15.1 15.2  HCT 47.6  < > 45.7 46.3  MCV 80.1  < > 82.3 82.8  PLT 213  < > 176 182  < > = values in this interval not displayed. Cardiac Enzymes:  Recent Labs  04/12/16 1802 04/12/16 2328 04/13/16 0959  TROPONINI 0.05* 0.04* 0.04*     ASSESSMENT AND PLAN:  Principal Problem:   Pleural effusion on right Active Problems:   Essential hypertension   Atrial fibrillation ([facility])   CAD (coronary artery disease)   Chronic systolic CHF (congestive heart failure) ([facility])   Pleural effusion   Elevated troponin I level   1.  Persistent atrial fibrillation and atrial flutter Continue Eliquis for CHADS2VASC of 4 Plan TEE today to rule out LAA thrombus as Eliquis was held for  thoracentesis (no anesthesia availability for cardioversion today) If no thrombus, will start Tikosyn today BMET, QTc ok.  Mg pending If remains in flutter on Friday, will need DCCV  2.  Acute on chronic systolic heart failure Exacerbated by atrial arrhythmias Would benefit from SR long term Continue medical therapy Creatinine trending up, will change Lasix to po today and monitor fluid status  Dr Lovena Le to see later today  Chanetta Marshall, NP 04/15/2016 6:45 AM  EP Attending  Patient seen and examined. Agree with above. He remains in atypical atrial flutter and will plan TEE today with initiation of dofetilide if no LAA thrombus. I have reviewed the findings as documented above and agree with what is documented. Anticipate DC home on Saturday.  Mikle Bosworth.D.

## 2016-04-15 NOTE — CV Procedure (Signed)
    TEE  AFlutter  Findings:   - EF 35-40% with moderate LVH  - smoke in LA/RA  - PFO, positive by color and bubble   - moderate TR  - Trace MR/AI  - No thrombus  See full report for details  Candee Furbish, MD

## 2016-04-15 NOTE — Progress Notes (Addendum)
[facility]                                                                              Patient Demographics  Christopher Benton, is a 75 y.o. male, DOB - [birth year], VOH:[id number]  Admit date - 04/12/2016   Admitting Physician Elwin Mocha, MD  Outpatient Primary MD for the patient is Pcp Not In System  Outpatient specialists:   LOS - 3  days    Chief Complaint  Patient presents with  . 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 He went to [place] on 03/22/16 and had chest x-ray which showed new moderate right-sided pleural effusion with right middle and lower lobe collapse / consolidation. He was treated with Lasix and antibiotics. He felt better for at least 2 weeks. Then over the last 1 week, he again felt short of breath with exertion,  2 pillow orthopnea and swelling in his feet and legs in the last 1 week. No chest pain.  CT angiogram chest showed no evidence of acute pulmonary embolism. Moderate to large right-sided pleural effusion with aortic atherosclerosis. No airspace consolidation identified.   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 No smoking history - Ultrasound-guided thoracentesis, 1.3 L removed, studies transudative - Cardiology Consultation and follow-up appreciated. Initially diuresed with IV Lasix which was transitioned to PO on 04/15/16.   Acute on Chronic systolic CHF (congestive heart failure) ([place]) - Initially treated with IV Lasix. - 2105 mls since admission. Weight down by 8 pounds since admission. - LVEF 35-40 percent by TEE  11/15. - Cardiology follow-up appreciated. Initially treated with IV Lasix and then transition to PO on 04/15/16. As per cardiology, this was exacerbated by atrial arrhythmias and would benefit from long-term sinus rhythm.    Essential hypertension - Restarted Coreg, losartan, Lasix. Controlled.   Atypical atrial flutter/Atrial fibrillation (HCC) with RVR - Continue Coreg, rate controlled - Restarted eliquis after thoracentesis. Continue Eliquis for CHADS2VASC of 4 - As per cardiology follow-up, since TEE negative for LAA thrombus, plan to start Tikosyn 11/15 and if remains in atrial flutter on Friday, will need DCCV. Potassium 4 and magnesium 2. QTC 469.    CAD (coronary artery disease):  - Patient had a stress echo done in August 2017 which had shown EF of 41% and no reversible perfusion defects - Troponin slightly positive, however no chest pain - 2-D echo still pending - Continue Coreg, Lasix, losartan  Acute kidney injury - Creatinine has gone up from 1.07 on 11/14-1.26 on 11/15. Likely related to diuresis which is being switched to oral Lasix today. Follow BMP in a.m.   Code Status: Full CODE STATUS DVT Prophylaxis:  Restart eliquis  Family Communication: Discussed with  patient's spouse in detail at bedside. Updated care and answered questions. Disposition Plan:  DC home when clinically improved and cleared by cardiology.    Procedures:    thoracentesis 11/13   TEE 04/15/16: Study Conclusions  - Left ventricle: There was moderate concentric hypertrophy.   Systolic function was moderately reduced. The estimated ejection   fraction was in the range of 35% to 40%. - Aortic valve: No evidence of vegetation. There was trivial   regurgitation. - Mitral valve: No evidence of vegetation. - Left atrium: The atrium was dilated. No evidence of thrombus in   the appendage. There was spontaneous echo contrast (&quot;smoke&quot;). - Right atrium: The atrium was dilated. No  evidence of thrombus in   the atrial cavity or appendage. There was spontaneous echo   contrast (&quot;smoke&quot;). - Atrial septum: There was a patent foramen ovale. - Tricuspid valve: No evidence of vegetation. There was moderate   regurgitation. - Pulmonic valve: No evidence of vegetation.  Impressions:  - No thrombus.  Consultants:    Cardiology  EP Cardiology  Antimicrobials:   None   Medications  Scheduled Meds: . apixaban  5 mg Oral BID  . carvedilol  25 mg Oral BID  . dofetilide  500 mcg Oral BID  . dorzolamide  1 drop Both Eyes BID  . furosemide  40 mg Oral BID  . latanoprost  1 drop Both Eyes QHS  . losartan  25 mg Oral Daily  . multivitamin with minerals  1 tablet Oral Daily  . pravastatin  40 mg Oral Daily  . sodium chloride flush  3 mL Intravenous Q12H  . sodium chloride flush  3 mL Intravenous Q12H   Continuous Infusions: PRN Meds:.sodium chloride, sodium chloride, acetaminophen, ondansetron (ZOFRAN) IV, sodium chloride flush, sodium chloride flush   Antibiotics   Anti-infectives    None        Subjective:   Seen this morning prior to TEE. Spouse at bedside. Denies complaints. No chest pain, dyspnea or palpitations.  Objective:   Vitals:   04/15/16 1025 04/15/16 1030 04/15/16 1040 04/15/16 1050  BP: 104/67 101/75 114/81 94/66  Pulse: 70 71 73 (!) 34  Resp: 12 15 16 18   Temp:      TempSrc:      SpO2: 92% 95% 94% 93%  Weight:      Height:        Intake/Output Summary (Last 24 hours) at 04/15/16 1305 Last data filed at 04/15/16 0821  Gross per 24 hour  Intake              240 ml  Output             1150 ml  Net             -910 ml     Wt Readings from Last 3 Encounters:  04/15/16 113.5 kg (250 lb 4.8 oz)  03/22/16 117.9 kg (260 lb)  11/27/15 119.4 kg (263 lb 1.9 oz)     Exam  General: Alert and oriented x 3, NAD. Sitting up comfortably in bed.   Cardiovascular: S1 S2  irregularly irregular. No JVD, murmurs or pedal  edema. Telemetry: Atrial flutter with controlled ventricular rate.  Respiratory:  Clear to auscultation. No increased work of breathing.   Gastrointestinal: Soft, nontender, nondistended, + bowel sounds  Ext: no cyanosis clubbing, Pedal edema 1+  Neuro: no new deficits  Skin: No rashes  Psych: Normal affect and demeanor, alert and oriented x3  Data Reviewed:  I have personally reviewed following labs and imaging studies  Micro Results Recent Results (from the past 240 hour(s))  Culture, body fluid-bottle     Status: None (Preliminary result)   Collection Time: 04/13/16 11:20 AM  Result Value Ref Range Status   Specimen Description FLUID PLEURAL RIGHT  Final   Special Requests NONE  Final   Culture NO GROWTH 1 DAY  Final   Report Status PENDING  Incomplete  Gram stain     Status: None   Collection Time: 04/13/16 11:20 AM  Result Value Ref Range Status   Specimen Description FLUID PLEURAL RIGHT  Final   Special Requests NONE  Final   Gram Stain   Final    FEW WBC PRESENT, PREDOMINANTLY MONONUCLEAR NO ORGANISMS SEEN    Report Status 04/13/2016 FINAL  Final    Radiology Reports Dg Chest 1 View  Result Date: 04/13/2016 CLINICAL DATA:  Post right thoracentesis. EXAM: CHEST 1 VIEW COMPARISON:  04/12/2016 FINDINGS: No pneumothorax following thoracentesis. Decreasing right pleural effusion. Heart is mildly enlarged. Minimal right base atelectasis. Left lung is clear. IMPRESSION: No pneumothorax following right thoracentesis. Electronically Signed   By: Rolm Baptise M.D.   On: 04/13/2016 11:18   Dg Chest 2 View  Result Date: 04/12/2016 CLINICAL DATA:  Weakness for 3 days.  Pneumonia. EXAM: CHEST  2 VIEW COMPARISON:  March 22, 2016 FINDINGS: Persistent effusion and opacity on the right primarily involving the right middle lobe and a portion of the right lower lobe. The finding is similar to mildly improved in the interval. No other interval changes or acute abnormalities.  IMPRESSION: Persistent and effusion in the right base/middle lobe. Recommend follow-up to complete resolution. Given persistence for 3 weeks, the patient may benefit from a CT scan. Electronically Signed   By: Dorise Bullion III M.D   On: 04/12/2016 09:24   Dg Chest 2 View  Result Date: 03/22/2016 CLINICAL DATA:  Acute worsening shortness of breath, atrial fibrillation. EXAM: CHEST  2 VIEW COMPARISON:  08/16/2015 FINDINGS: New moderate right pleural effusion with right middle and lower lobe collapse/ consolidation. Stable cardiomegaly without superimposed CHF or edema. Right upper lobe is clear. Stable left lung aeration. Stable left apical oval soft tissue shadow versus pleural abnormality compared 08/16/2015. This remains indeterminate. Trachea is midline. Atherosclerosis noted of the aorta. aa IMPRESSION: New moderate right pleural effusion with right middle and lower lobe collapse/consolidation. Underlying pneumonia or mass not excluded. Stable cardiomegaly without CHF Stable indeterminate left apical soft tissue versus pleural density. Electronically Signed   By: Jerilynn Mages.  Shick M.D.   On: 03/22/2016 11:51   Ct Angio Chest Pe W Or Wo Contrast  Result Date: 04/12/2016 CLINICAL DATA:  Evaluate for pulmonary embolus. EXAM: CT ANGIOGRAPHY CHEST WITH CONTRAST TECHNIQUE: Multidetector CT imaging of the chest was performed using the standard protocol during bolus administration of intravenous contrast. Multiplanar CT image reconstructions and MIPs were obtained to evaluate the vascular anatomy. CONTRAST:  100 cc of Isovue 370 COMPARISON:  None. FINDINGS: Cardiovascular: Satisfactory opacification of the pulmonary arteries to the segmental level. No evidence of pulmonary embolism. Normal heart size. No pericardial effusion. Aortic atherosclerosis is identified. Mediastinum/Nodes: The trachea appears patent and is midline. Normal appearance of the esophagus. Prominent nodes are identified within the mediastinum.  The largest is in the right paratracheal region measuring 11 mm. Lungs/Pleura: Moderate to large right pleural effusion is identified. There is overlying compressive type atelectasis within the right lower lobe. No airspace  consolidation identified. Upper Abdomen: Left lobe of liver cyst measures 3.1 cm. Mild perihepatic ascites. 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 No aggressive lytic or sclerotic bone lesions. 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 Review of the MIP images confirms the above findings. IMPRESSION: 1. No evidence for acute pulmonary embolus. 2. Moderate to large right pleural effusion. 3. Aortic atherosclerosis Electronically Signed   By: Kerby Moors M.D.   On: 04/12/2016 10:45   US Thoracentesis Asp Pleural Space W/img Guide  Result Date: 04/13/2016 INDICATION: Shortness of breath. Right-sided pleural effusion. Request thoracentesis. EXAM: ULTRASOUND GUIDED RIGHT THORACENTESIS MEDICATIONS: None. COMPLICATIONS: None immediate. Postprocedural chest x-ray negative for pneumothorax. PROCEDURE: An ultrasound guided thoracentesis was thoroughly discussed with the patient and questions answered. The benefits, risks, alternatives and complications were also discussed. The patient understands and wishes to proceed with the procedure. Written consent was obtained. Ultrasound was performed to localize and mark an adequate pocket of fluid in the right chest. The area was then prepped and draped in the normal sterile fashion. 1% Lidocaine was used for local anesthesia. Under ultrasound guidance a Safe-T-Centesis catheter was introduced. Thoracentesis was performed. The catheter was removed and a dressing applied. FINDINGS: A total of approximately 1.3 L of hazy, yellow fluid was removed. Samples were sent to the laboratory as requested by the clinical team.  IMPRESSION: Successful ultrasound guided right thoracentesis yielding 1.3 L of pleural fluid. Read by: Ascencion Dike PA-C Electronically Signed   By: Lucrezia Europe M.D.   On: 04/13/2016 11:45    Lab Data:  CBC:  Recent Labs Lab 04/12/16 0900 04/13/16 0959 04/14/16 0220 04/15/16 0322  WBC 11.5* 9.9 9.2 10.6*  NEUTROABS 7.1  --   --   --   HGB 15.9 15.8 15.1 15.2  HCT 47.6 47.9 45.7 46.3  MCV 80.1 82.7 82.3 82.8  PLT 213 199 176 454   Basic Metabolic Panel:  Recent Labs Lab 04/12/16 0900 04/13/16 0959 04/14/16 0220 04/15/16 0322 04/15/16 0659  NA 136 139 138 140  --   K 4.1 3.9 3.8 4.0  --   CL 104 104 106 103  --   CO2 25 26 23 28   --   GLUCOSE 120* 115* 104* 97  --   BUN 13 11 14 12   --   CREATININE 1.10 1.13 1.07 1.26*  --   CALCIUM 9.4 9.4 8.8* 9.0  --   MG  --   --   --   --  2.0   GFR: Estimated Creatinine Clearance: 64.9 mL/min (by C-G formula based on SCr of 1.26 mg/dL (H)). Liver Function Tests:  Recent Labs Lab 04/12/16 0900 04/13/16 0959  AST 33 27  ALT 34 29  ALKPHOS 80 70  BILITOT 2.4* 2.5*  PROT 7.2 6.3*  ALBUMIN 4.2 4.0   No results for input(s): LIPASE, AMYLASE in the last 168 hours. No results for input(s): AMMONIA in the last 168 hours. Coagulation Profile: No results for input(s): INR, PROTIME in the last 168 hours. Cardiac Enzymes:  Recent Labs Lab 04/12/16 0900 04/12/16 1802 04/12/16 2328 04/13/16 0959  TROPONINI 0.04* 0.05* 0.04* 0.04*   BNP (last 3 results) No results for input(s): PROBNP in the last 8760 hours. HbA1C: No results for input(s): HGBA1C in the last 72 hours. CBG: No results for input(s): GLUCAP in the last 168 hours. Lipid Profile: No results for input(s):  CHOL, HDL, LDLCALC, TRIG, CHOLHDL, LDLDIRECT in the last 72 hours. Thyroid Function Tests: No results for input(s): TSH, T4TOTAL, FREET4, T3FREE, THYROIDAB in the last 72 hours. Anemia Panel: No results for input(s): VITAMINB12, FOLATE, FERRITIN, TIBC,  IRON, RETICCTPCT in the last 72 hours. Urine analysis: No results found for: COLORURINE, APPEARANCEUR, LABSPEC, PHURINE, GLUCOSEU, HGBUR, BILIRUBINUR, KETONESUR, PROTEINUR, UROBILINOGEN, NITRITE, Blanchard Mane, MD, FACP, FHM. [facility] Pager [phone]  If 7PM-7AM, please contact night-coverage [email or web address] Password [email or web address] 04/15/2016, 1:18 PM

## 2016-04-15 NOTE — Progress Notes (Signed)
  Echocardiogram Echocardiogram Transesophageal has been performed.  Christopher Benton 04/15/2016, 2:15 PM

## 2016-04-15 NOTE — Progress Notes (Signed)
SUBJECTIVE: The patient is doing well today.  At this time, he denies chest pain, shortness of breath, or any new concerns.  CURRENT MEDICATIONS: . apixaban  5 mg Oral BID  . carvedilol  25 mg Oral BID  . dorzolamide  1 drop Both Eyes BID  . furosemide  40 mg Intravenous Q12H  . latanoprost  1 drop Both Eyes QHS  . losartan  25 mg Oral Daily  . multivitamin with minerals  1 tablet Oral Daily  . pravastatin  40 mg Oral Daily  . sodium chloride flush  3 mL Intravenous Q12H     OBJECTIVE: Physical Exam: Vitals:   04/14/16 0646 04/14/16 1238 04/14/16 1925 04/15/16 0505  BP: (!) 101/57 (!) 96/59 109/72 109/81  Pulse:  (!) 44 75 71  Resp:  18 20 20   Temp:  98 F (36.7 C) 97.4 F (36.3 C) 98 F (36.7 C)  TempSrc:  Oral Oral Oral  SpO2:  96% 96% 98%  Weight:    250 lb 4.8 oz (113.5 kg)  Height:        Intake/Output Summary (Last 24 hours) at 04/15/16 8099 Last data filed at 04/15/16 0600  Gross per 24 hour  Intake              960 ml  Output             1550 ml  Net             -590 ml    Telemetry reveals rate controlled atypical atrial flutter  GEN- The patient is well appearing, alert and oriented x 3 today.   Head- normocephalic, atraumatic Eyes-  Sclera clear, conjunctiva pink Ears- hearing intact Oropharynx- clear Neck- supple  Lungs- Clear to ausculation bilaterally, normal work of breathing Heart- Irregular rate and rhythm  GI- soft, NT, ND, + BS Extremities- no clubbing, cyanosis, or edema Skin- no rash or lesion Psych- euthymic mood, full affect Neuro- strength and sensation are intact  LABS: Basic Metabolic Panel:  Recent Labs  04/14/16 0220 04/15/16 0322  NA 138 140  K 3.8 4.0  CL 106 103  CO2 23 28  GLUCOSE 104* 97  BUN 14 12  CREATININE 1.07 1.26*  CALCIUM 8.8* 9.0   Liver Function Tests:  Recent Labs  04/12/16 0900 04/13/16 0959  AST 33 27  ALT 34 29  ALKPHOS 80 70  BILITOT 2.4* 2.5*  PROT 7.2 6.3*  ALBUMIN 4.2 4.0    CBC:  Recent Labs  04/12/16 0900  04/14/16 0220 04/15/16 0322  WBC 11.5*  < > 9.2 10.6*  NEUTROABS 7.1  --   --   --   HGB 15.9  < > 15.1 15.2  HCT 47.6  < > 45.7 46.3  MCV 80.1  < > 82.3 82.8  PLT 213  < > 176 182  < > = values in this interval not displayed. Cardiac Enzymes:  Recent Labs  04/12/16 1802 04/12/16 2328 04/13/16 0959  TROPONINI 0.05* 0.04* 0.04*     ASSESSMENT AND PLAN:  Principal Problem:   Pleural effusion on right Active Problems:   Essential hypertension   Atrial fibrillation (HCC)   CAD (coronary artery disease)   Chronic systolic CHF (congestive heart failure) (HCC)   Pleural effusion   Elevated troponin I level   1.  Persistent atrial fibrillation and atrial flutter Continue Eliquis for CHADS2VASC of 4 Plan TEE today to rule out LAA thrombus as Eliquis was held for  thoracentesis (no anesthesia availability for cardioversion today) If no thrombus, will start Tikosyn today BMET, QTc ok.  Mg pending If remains in flutter on Friday, will need DCCV  2.  Acute on chronic systolic heart failure Exacerbated by atrial arrhythmias Would benefit from SR long term Continue medical therapy Creatinine trending up, will change Lasix to po today and monitor fluid status  Dr Lovena Le to see later today  Chanetta Marshall, NP 04/15/2016 6:45 AM  EP Attending  Patient seen and examined. Agree with above. He remains in atypical atrial flutter and will plan TEE today with initiation of dofetilide if no LAA thrombus. I have reviewed the findings as documented above and agree with what is documented. Anticipate DC home on Saturday.  Mikle Bosworth.D.

## 2016-04-15 NOTE — Progress Notes (Signed)
Pharmacy Review for Dofetilide (Tikosyn) Initiation  Admit Complaint: 75 y.o. male admitted 04/12/2016 with atrial fibrillation to be initiated on dofetilide.   Assessment:  Patient Exclusion Criteria: If any screening criteria checked as "Yes", then  patient  should NOT receive dofetilide until criteria item is corrected. If "Yes" please indicate correction plan.  YES  NO Patient  Exclusion Criteria Correction Plan  []  [x]  Baseline QTc interval is greater than or equal to 440 msec. IF above YES box checked dofetilide contraindicated unless patient has ICD; then may proceed if QTc 500-550 msec or with known ventricular conduction abnormalities may proceed with QTc 550-600 msec. QTc = 0.47   []  [x]  Magnesium level is less than 1.8 mEq/l : Last magnesium:  Lab Results  Component Value Date   MG 2.0 04/15/2016         []  [x]  Potassium level is less than 4 mEq/l : Last potassium:  Lab Results  Component Value Date   K 4.0 04/15/2016         []  [x]  Patient is known or suspected to have a digoxin level greater than 2 ng/ml: No results found for: DIGOXIN    []  [x]  Creatinine clearance less than 20 ml/min (calculated using Cockcroft-Gault, actual body weight and serum creatinine): Estimated Creatinine Clearance: 64.9 mL/min (by C-G formula based on SCr of 1.26 mg/dL (H)).    []  [x]  Patient has received drugs known to prolong the QT intervals within the last 48 hours (phenothiazines, tricyclics or tetracyclic antidepressants, erythromycin, H-1 antihistamines, cisapride, fluoroquinolones, azithromycin). Drugs not listed above may have an, as yet, undetected potential to prolong the QT interval, updated information on QT prolonging agents is available at this website:QT prolonging agents   []  [x]  Patient received a dose of hydrochlorothiazide (Oretic) alone or in any combination including triamterene (Dyazide, Maxzide) in the last 48 hours.   []  [x]  Patient received a medication known to  increase dofetilide plasma concentrations prior to initial dofetilide dose:  . Trimethoprim (Primsol, Proloprim) in the last 36 hours . Verapamil (Calan, Verelan) in the last 36 hours or a sustained release dose in the last 72 hours . Megestrol (Megace) in the last 5 days  . Cimetidine (Tagamet) in the last 6 hours . Ketoconazole (Nizoral) in the last 24 hours . Itraconazole (Sporanox) in the last 48 hours  . Prochlorperazine (Compazine) in the last 36 hours    []  [x]  Patient is known to have a history of torsades de pointes; congenital or acquired long QT syndromes.   []  [x]  Patient has received a Class 1 antiarrhythmic with less than 2 half-lives since last dose. (Disopyramide, Quinidine, Procainamide, Lidocaine, Mexiletine, Flecainide, Propafenone)   []  [x]  Patient has received amiodarone therapy in the past 3 months or amiodarone level is greater than 0.3 ng/ml.    Patient has been appropriately anticoagulated with Apixaban.  Ordering provider was confirmed at [email or web address] if they are not listed on the [place] Prescribers list.  Goal of Therapy: Follow renal function, electrolytes, potential drug interactions, and dose adjustment. Provide education and 1 week supply at discharge.  Plan:  [x]   Physician selected initial dose within range recommended for patients level of renal function - will monitor for response.  []   Physician selected initial dose outside of range recommended for patients level of renal function - will discuss if the dose should be altered at this time.   Select One Calculated CrCl  Dose q12h  [x]  > 60 ml/min 500 mcg  []   40-60 ml/min 250 mcg  []  20-40 ml/min 125 mcg   2. Follow up QTc after the first 5 doses, renal function, electrolytes (K & Mg) daily x 3     days, dose adjustment, success of initiation and facilitate 1 week discharge supply as     clinically indicated.  3. Initiate Tikosyn education video (Call [phone] and ask for Tikosyn Video  # 116).  4. Place Enrollment Form on the chart for discharge supply of dofetilide.  Uvaldo Rising, BCPS  Clinical Pharmacist Pager [phone]  04/15/2016 1:11 PM

## 2016-04-16 ENCOUNTER — Encounter (HOSPITAL_COMMUNITY): Payer: Self-pay | Admitting: Cardiology

## 2016-04-16 ENCOUNTER — Other Ambulatory Visit: Payer: Self-pay

## 2016-04-16 DIAGNOSIS — I480 Paroxysmal atrial fibrillation: Secondary | ICD-10-CM

## 2016-04-16 LAB — BASIC METABOLIC PANEL
ANION GAP: 8 (ref 5–15)
BUN: 10 mg/dL (ref 6–20)
CALCIUM: 9.1 mg/dL (ref 8.9–10.3)
CO2: 28 mmol/L (ref 22–32)
CREATININE: 1.25 mg/dL — AB (ref 0.61–1.24)
Chloride: 104 mmol/L (ref 101–111)
GFR calc non Af Amer: 55 mL/min — ABNORMAL LOW (ref >60)
Glucose, Bld: 98 mg/dL (ref 65–99)
Potassium: 4 mmol/L (ref 3.5–5.1)
SODIUM: 140 mmol/L (ref 135–145)

## 2016-04-16 LAB — MAGNESIUM: Magnesium: 1.9 mg/dL (ref 1.7–2.4)

## 2016-04-16 NOTE — Discharge Instructions (Addendum)
You have an appointment set up with the At[facility]ibrillation Clinic.  Multiple studies have shown that being followed by a dedicated atrial fibrillation clinic in addition to the standard care you receive from your other physicians improves health. We believe that enrollment in the atrial fibrillation clinic will allow us to better care for you.  ° °The phone number to the Atrial Fibrillation Clinic is [phone]. The clinic is staffed Monday through Friday from 8:30am to 5pm. ° °Parking Directions: The clinic is located in the Heart [facility] V[facility] Building connected [facility]. °1)From [address] turn on to [address] and go to the 3rd entrance  (Heart and Vascular entrance) on the right. °2)Look to the right for Heart &Vascular Parking Garage. °3)A code for the entrance is required please call the clinic to receive this.   °4)Take the elevators to the 1st floor. Registration is in the room with the glass walls at the end of the hallway. ° °If you have any trouble parking or locating the clinic, please don’t hesitate to call [phone]. ° ° ° °Information on my medicine - ELIQUIS® (apixaban) ° °This medication education was reviewed with me or my healthcare representative as part of my discharge preparation.  ° °Why was Eliquis® prescribed for you? °Eliquis® was prescribed for you to reduce the risk of a blood clot forming that can cause a stroke if you have a medical condition called atrial fibrillation (a type of irregular heartbeat). ° °What do You need to know about Eliquis® ? °Take your Eliquis® TWICE DAILY - one tablet in the morning and one tablet in the evening with or without food. If you have difficulty swallowing the tablet whole please discuss with your pharmacist how to take the medication safely. ° °Take Eliquis® exactly as prescribed by your doctor and DO NOT stop taking Eliquis® without talking to the doctor who prescribed the medication.  Stopping may increase your risk of  developing a stroke.  Refill your prescription before you run out. ° °After discharge, you should have regular check-up appointments with your healthcare provider that is prescribing your Eliquis®.  In the future your dose may need to be changed if your kidney function or weight changes by a significant amount or as you get older. ° °What do you do if you miss a dose? °If you miss a dose, take it as soon as you remember on the same day and resume taking twice daily.  Do not take more than one dose of ELIQUIS at the same time to make up a missed dose. ° °Important Safety Information °A possible side effect of Eliquis® is bleeding. You should call your healthcare provider right away if you experience any of the following: °? Bleeding from an injury or your nose that does not stop. °? Unusual colored urine (red or dark brown) or unusual colored stools (red or black). °? Unusual bruising for unknown reasons. °? A serious fall or if you hit your head (even if there is no bleeding). ° °Some medicines may interact with Eliquis® and might increase your risk of bleeding or clotting while on Eliquis®. To help avoid this, consult your healthcare provider or pharmacist prior to using any new prescription or non-prescription medications, including herbals, vitamins, non-steroidal anti-inflammatory drugs (NSAIDs) and supplements. ° °This website has more information on Eliquis® (apixaban): http://www.eliquis.com/eliquis/home ° °

## 2016-04-16 NOTE — Progress Notes (Signed)
SUBJECTIVE: The patient is doing well today.  At this time, he denies chest pain, shortness of breath, or any new concerns.  CURRENT MEDICATIONS: . apixaban  5 mg Oral BID  . carvedilol  25 mg Oral BID  . dofetilide  500 mcg Oral BID  . dorzolamide  1 drop Both Eyes BID  . furosemide  40 mg Oral BID  . latanoprost  1 drop Both Eyes QHS  . losartan  25 mg Oral Daily  . multivitamin with minerals  1 tablet Oral Daily  . pravastatin  40 mg Oral Daily  . sodium chloride flush  3 mL Intravenous Q12H  . sodium chloride flush  3 mL Intravenous Q12H     OBJECTIVE: Physical Exam: Vitals:   04/15/16 1947 04/15/16 2200 04/16/16 0530 04/16/16 0800  BP: 109/84 118/86 128/87 117/72  Pulse: (!) 109 (!) 107 (!) 103 72  Resp: 18  20   Temp: 98.1 F (36.7 C)  97.7 F (36.5 C)   TempSrc: Oral  Oral   SpO2: 98%  94%   Weight:   247 lb 4.8 oz (112.2 kg)   Height:        Intake/Output Summary (Last 24 hours) at 04/16/16 1040 Last data filed at 04/16/16 0500  Gross per 24 hour  Intake              240 ml  Output             1100 ml  Net             -860 ml    Telemetry reveals sinus rhythm  GEN- The patient is well appearing, alert and oriented x 3 today.   Head- normocephalic, atraumatic Eyes-  Sclera clear, conjunctiva pink Ears- hearing intact Oropharynx- clear Neck- supple  Lungs- Clear to ausculation bilaterally, normal work of breathing Heart- Regular rate and rhythm  GI- soft, NT, ND, + BS Extremities- no clubbing, cyanosis, or edema Skin- no rash or lesion Psych- euthymic mood, full affect Neuro- strength and sensation are intact  LABS: Basic Metabolic Panel:  Recent Labs  04/15/16 0322 04/15/16 0659 04/16/16 0241  NA 140  --  140  K 4.0  --  4.0  CL 103  --  104  CO2 28  --  28  GLUCOSE 97  --  98  BUN 12  --  10  CREATININE 1.26*  --  1.25*  CALCIUM 9.0  --  9.1  MG  --  2.0 1.9   CBC:  Recent Labs  04/14/16 0220 04/15/16 0322  WBC 9.2 10.6*    HGB 15.1 15.2  HCT 45.7 46.3  MCV 82.3 82.8  PLT 176 182    RADIOLOGY: Dg Chest 1 View Result Date: 04/13/2016 CLINICAL DATA:  Post right thoracentesis. EXAM: CHEST 1 VIEW COMPARISON:  04/12/2016 FINDINGS: No pneumothorax following thoracentesis. Decreasing right pleural effusion. Heart is mildly enlarged. Minimal right base atelectasis. Left lung is clear. IMPRESSION: No pneumothorax following right thoracentesis. Electronically Signed   By: Rolm Baptise M.D.   On: 04/13/2016 11:18   ASSESSMENT AND PLAN:  Principal Problem:   Pleural effusion on right Active Problems:   Essential hypertension   Atrial fibrillation (HCC)   CAD (coronary artery disease)   Chronic systolic CHF (congestive heart failure) (HCC)   Recurrent pleural effusion on right   Elevated troponin I level   1.  Persistent atrial fibrillation/flutter Reverted to SR after 2nd dose of Tikosyn Continue  Tikosyn 527mcg twice daily BMET, Mg, QTc stable  2. Acute on chronic systolic heart failure Exacerbated by atrial arrhythmias Would benefit from SR long term Continue medical therapy Creatinine stable today   He will get Tikosyn from [place] and will need 14 day supply at discharge.  Will schedule 1 week follow up for labs and EKG, 4 week follow up with Dr Lovena Le.   Chanetta Marshall, NP 04/16/2016 10:41 AM  EP Attending  Patient seen and examined. Agree with the findings as noted above by Chanetta Marshall, NP-C. The patient has returned to NSR on Tikosyn. We will keep him until Saturday morning, following the QTC which is ok today in NSR. I would expect that at discharge he will not need daily lasix.   Mikle Bosworth.D.

## 2016-04-16 NOTE — Progress Notes (Signed)
[facility]                                                                              Patient Demographics  Christopher Benton, is a 75 y.o. male, DOB - [birth year], GBT:[id number]  Admit date - 04/12/2016   Admitting Physician Elwin Mocha, MD  Outpatient Primary MD for the patient is Pcp Not In System  Outpatient specialists:   LOS - 4  days    Chief Complaint  Patient presents with  . 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 He went to [place] on 03/22/16 and had chest x-ray which showed new moderate right-sided pleural effusion with right middle and lower lobe collapse / consolidation. He was treated with Lasix and antibiotics. He felt better for at least 2 weeks. Then over the last 1 week, he again felt short of breath with exertion,  2 pillow orthopnea and swelling in his feet and legs in the last 1 week. No chest pain.  CT angiogram chest showed no evidence of acute pulmonary embolism. Moderate to large right-sided pleural effusion with aortic atherosclerosis. No airspace consolidation identified.   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 No smoking history - Ultrasound-guided thoracentesis, 1.3 L removed, studies transudative - Cardiology Consultation and follow-up appreciated. Initially diuresed with IV Lasix which was transitioned to PO on 04/15/16. - Clinically improved. Repeat chest x-ray 11/17 to follow-up on effusion. - No malignant cells identified.  (Cytopathology results  PLEURAL FLUID, RIGHT (SPECIMEN 1 OF 1, COLLECTED 04/13/16): NO  MALIGNANT CELLS IDENTIFIED).   Acute on Chronic systolic CHF (congestive heart failure) ([place]) - Initially treated with IV Lasix. - 2105 mls since admission. Weight down by 8 pounds since admission. - LVEF 35-40 percent by TEE 11/15. - Cardiology follow-up appreciated. Initially treated with IV Lasix and then transitioned to PO on 04/15/16. As per cardiology, this was exacerbated by atrial arrhythmias and would benefit from long-term sinus rhythm. - Improved and compensated. Per cardiology, do not expect that he will need Lasix daily at discharge.    Essential hypertension - Restarted Coreg, losartan, Lasix. Controlled.   Atypical atrial flutter/Atrial fibrillation (HCC) with RVR - Continue Coreg, rate controlled - Restarted eliquis after thoracentesis. Continue Eliquis for CHADS2VASC of 4 - After TEE negative for LAA thrombus, started Tikosyn 11/15 - Patient reverted to sinus rhythm on the morning of 11/16. As per discussion and follow-up by cardiology: Patient to remain in the hospital until Saturday morning. BMP, magnesium and QTC stable. Patient apparently will get his Tikosyn from [place] and will need 14 day supply at discharge. Cardiology will arrange follow-up in one week with repeat labs and EKG and four-week follow-up with Dr. Lovena Le.  CAD (coronary artery disease):  - Patient had a stress echo done in August 2017 which had shown EF of 41% and no reversible perfusion defects - Troponin slightly positive, however no chest pain - 2-D echo: Results as below. LVEF 35-40 percent. - Continue Coreg, Lasix, losartan  Acute kidney injury - Creatinine has gone up from 1.07 on 11/14-1.26 on 11/15. Likely related to diuresis which is being switched to oral Lasix 11/16. Creatinine stable in the 1.2 range. Follow BMP in a.m.   Code Status: Full CODE STATUS DVT Prophylaxis:  Restart eliquis  Family Communication: Discussed with patient's spouse in detail at bedside. Updated care and answered  questions. Disposition Plan:  DC home possibly 04/18/16.    Procedures:    thoracentesis 11/13   TEE 04/15/16: Study Conclusions  - Left ventricle: There was moderate concentric hypertrophy.   Systolic function was moderately reduced. The estimated ejection   fraction was in the range of 35% to 40%. - Aortic valve: No evidence of vegetation. There was trivial   regurgitation. - Mitral valve: No evidence of vegetation. - Left atrium: The atrium was dilated. No evidence of thrombus in   the appendage. There was spontaneous echo contrast (&quot;smoke&quot;). - Right atrium: The atrium was dilated. No evidence of thrombus in   the atrial cavity or appendage. There was spontaneous echo   contrast (&quot;smoke&quot;). - Atrial septum: There was a patent foramen ovale. - Tricuspid valve: No evidence of vegetation. There was moderate   regurgitation. - Pulmonic valve: No evidence of vegetation.  Impressions:  - No thrombus.   2-D echo 04/14/16: Study Conclusions  - Left ventricle: The cavity size was normal. Wall thickness was   increased in a pattern of moderate LVH. Systolic function was   moderately reduced. The estimated ejection fraction was in the   range of 35% to 40%. Diffuse hypokinesis. Doppler parameters are   consistent with high ventricular filling pressure. - Left atrium: The atrium was mildly dilated. - Right ventricle: The cavity size was mildly dilated. - Right atrium: The atrium was moderately dilated. - Pericardium, extracardiac: A trivial pericardial effusion was   identified.  Impressions:  - Moderate global reduction in LV systolic function; moderate LVH;   elevated LV filling pressure; biatrial enlargement; RV not well   visualized but appears mildly dilated.  Consultants:    Cardiology  EP Cardiology  Antimicrobials:   None   Medications  Scheduled Meds: . apixaban  5 mg Oral BID  . carvedilol  25 mg Oral BID  . dofetilide  500  mcg Oral BID  . dorzolamide  1 drop Both Eyes BID  . furosemide  40 mg Oral BID  . latanoprost  1 drop Both Eyes QHS  . losartan  25 mg Oral Daily  . multivitamin with minerals  1 tablet Oral Daily  . pravastatin  40 mg Oral Daily  . sodium chloride flush  3 mL Intravenous Q12H  . sodium chloride flush  3 mL Intravenous Q12H   Continuous Infusions: PRN Meds:.sodium chloride, acetaminophen, ondansetron (ZOFRAN) IV, sodium chloride flush, sodium chloride flush   Antibiotics   Anti-infectives    None        Subjective:   Patient denies complaints. No palpitations, chest pain or dyspnea.  Objective:   Vitals:   04/15/16 2200 04/16/16 0530 04/16/16 0800 04/16/16 1227  BP: 118/86 128/87 117/72 104/68  Pulse: (!) 107 (!) 103 72 71  Resp:  20  18  Temp:  97.7  F (36.5 C)  97.8 F (36.6 C)  TempSrc:  Oral  Oral  SpO2:  94%  97%  Weight:  112.2 kg (247 lb 4.8 oz)    Height:        Intake/Output Summary (Last 24 hours) at 04/16/16 1456 Last data filed at 04/16/16 0500  Gross per 24 hour  Intake                0 ml  Output             1100 ml  Net            -1100 ml     Wt Readings from Last 3 Encounters:  04/16/16 112.2 kg (247 lb 4.8 oz)  03/22/16 117.9 kg (260 lb)  11/27/15 119.4 kg (263 lb 1.9 oz)     Exam  General: Alert and oriented x 3, NAD. Sitting up comfortably in bed.   Cardiovascular: S1 S2  irregularly irregular. No JVD, murmurs or pedal edema. Telemetry: Atrial flutter with controlled ventricular rate>reverted to normal sinus rhythm on 04/16/16 at 4:21 AM.  Respiratory:  Clear to auscultation. No increased work of breathing.   Gastrointestinal: Soft, nontender, nondistended, + bowel sounds  Ext: no cyanosis clubbing, Pedal edema 1+  Neuro: no new deficits  Skin: No rashes  Psych: Normal affect and demeanor, alert and oriented x3    Data Reviewed:  I have personally reviewed following labs and imaging studies  Micro Results Recent  Results (from the past 240 hour(s))  Culture, body fluid-bottle     Status: None (Preliminary result)   Collection Time: 04/13/16 11:20 AM  Result Value Ref Range Status   Specimen Description FLUID PLEURAL RIGHT  Final   Special Requests NONE  Final   Culture NO GROWTH 2 DAYS  Final   Report Status PENDING  Incomplete  Gram stain     Status: None   Collection Time: 04/13/16 11:20 AM  Result Value Ref Range Status   Specimen Description FLUID PLEURAL RIGHT  Final   Special Requests NONE  Final   Gram Stain   Final    FEW WBC PRESENT, PREDOMINANTLY MONONUCLEAR NO ORGANISMS SEEN    Report Status 04/13/2016 FINAL  Final    Radiology Reports Dg Chest 1 View  Result Date: 04/13/2016 CLINICAL DATA:  Post right thoracentesis. EXAM: CHEST 1 VIEW COMPARISON:  04/12/2016 FINDINGS: No pneumothorax following thoracentesis. Decreasing right pleural effusion. Heart is mildly enlarged. Minimal right base atelectasis. Left lung is clear. IMPRESSION: No pneumothorax following right thoracentesis. Electronically Signed   By: Rolm Baptise M.D.   On: 04/13/2016 11:18   Dg Chest 2 View  Result Date: 04/12/2016 CLINICAL DATA:  Weakness for 3 days.  Pneumonia. EXAM: CHEST  2 VIEW COMPARISON:  March 22, 2016 FINDINGS: Persistent effusion and opacity on the right primarily involving the right middle lobe and a portion of the right lower lobe. The finding is similar to mildly improved in the interval. No other interval changes or acute abnormalities. IMPRESSION: Persistent and effusion in the right base/middle lobe. Recommend follow-up to complete resolution. Given persistence for 3 weeks, the patient may benefit from a CT scan. Electronically Signed   By: Dorise Bullion III M.D   On: 04/12/2016 09:24   Dg Chest 2 View  Result Date: 03/22/2016 CLINICAL DATA:  Acute worsening shortness of breath, atrial fibrillation. EXAM: CHEST  2 VIEW COMPARISON:  08/16/2015 FINDINGS: New moderate right pleural effusion  with right middle and  lower lobe collapse/ consolidation. Stable cardiomegaly without superimposed CHF or edema. Right upper lobe is clear. Stable left lung aeration. Stable left apical oval soft tissue shadow versus pleural abnormality compared 08/16/2015. This remains indeterminate. Trachea is midline. Atherosclerosis noted of the aorta. aa IMPRESSION: New moderate right pleural effusion with right middle and lower lobe collapse/consolidation. Underlying pneumonia or mass not excluded. Stable cardiomegaly without CHF Stable indeterminate left apical soft tissue versus pleural density. Electronically Signed   By: Jerilynn Mages.  Shick M.D.   On: 03/22/2016 11:51   Ct Angio Chest Pe W Or Wo Contrast  Result Date: 04/12/2016 CLINICAL DATA:  Evaluate for pulmonary embolus. EXAM: CT ANGIOGRAPHY CHEST WITH CONTRAST TECHNIQUE: Multidetector CT imaging of the chest was performed using the standard protocol during bolus administration of intravenous contrast. Multiplanar CT image reconstructions and MIPs were obtained to evaluate the vascular anatomy. CONTRAST:  100 cc of Isovue 370 COMPARISON:  None. FINDINGS: Cardiovascular: Satisfactory opacification of the pulmonary arteries to the segmental level. No evidence of pulmonary embolism. Normal heart size. No pericardial effusion. Aortic atherosclerosis is identified. Mediastinum/Nodes: The trachea appears patent and is midline. Normal appearance of the esophagus. Prominent nodes are identified within the mediastinum. The largest is in the right paratracheal region measuring 11 mm. Lungs/Pleura: Moderate to large right pleural effusion is identified. There is overlying compressive type atelectasis within the right lower lobe. No airspace consolidation identified. Upper Abdomen: Left lobe of liver cyst measures 3.1 cm. Mild perihepatic ascites. 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 No aggressive lytic or sclerotic bone lesions. Left posterior  chest wall lipoma is identified which extends through the third and fourth ribs space into the subpleural posterior left apex. Review of the MIP images confirms the above findings. IMPRESSION: 1. No evidence for acute pulmonary embolus. 2. Moderate to large right pleural effusion. 3. Aortic atherosclerosis Electronically Signed   By: Kerby Moors M.D.   On: 04/12/2016 10:45   US Thoracentesis Asp Pleural Space W/img Guide  Result Date: 04/13/2016 INDICATION: Shortness of breath. Right-sided pleural effusion. Request thoracentesis. EXAM: ULTRASOUND GUIDED RIGHT THORACENTESIS MEDICATIONS: None. COMPLICATIONS: None immediate. Postprocedural chest x-ray negative for pneumothorax. PROCEDURE: An ultrasound guided thoracentesis was thoroughly discussed with the patient and questions answered. The benefits, risks, alternatives and complications were also discussed. The patient understands and wishes to proceed with the procedure. Written consent was obtained. Ultrasound was performed to localize and mark an adequate pocket of fluid in the right chest. The area was then prepped and draped in the normal sterile fashion. 1% Lidocaine was used for local anesthesia. Under ultrasound guidance a Safe-T-Centesis catheter was introduced. Thoracentesis was performed. The catheter was removed and a dressing applied. FINDINGS: A total of approximately 1.3 L of hazy, yellow fluid was removed. Samples were sent to the laboratory as requested by the clinical team. IMPRESSION: Successful ultrasound guided right thoracentesis yielding 1.3 L of pleural fluid. Read by: Ascencion Dike PA-C Electronically Signed   By: Lucrezia Europe M.D.   On: 04/13/2016 11:45    Lab Data:  CBC:  Recent Labs Lab 04/12/16 0900 04/13/16 0959 04/14/16 0220 04/15/16 0322  WBC 11.5* 9.9 9.2 10.6*  NEUTROABS 7.1  --   --   --   HGB 15.9 15.8 15.1 15.2  HCT 47.6 47.9 45.7 46.3  MCV 80.1 82.7 82.3 82.8  PLT 213 199 176 025   Basic Metabolic  Panel:  Recent Labs Lab 04/12/16 0900 04/13/16 0959 04/14/16 0220 04/15/16 0322 04/15/16 8527  04/16/16 0241  NA 136 139 138 140  --  140  K 4.1 3.9 3.8 4.0  --  4.0  CL 104 104 106 103  --  104  CO2 25 26 23 28   --  28  GLUCOSE 120* 115* 104* 97  --  98  BUN 13 11 14 12   --  10  CREATININE 1.10 1.13 1.07 1.26*  --  1.25*  CALCIUM 9.4 9.4 8.8* 9.0  --  9.1  MG  --   --   --   --  2.0 1.9   GFR: Estimated Creatinine Clearance: 65.1 mL/min (by C-G formula based on SCr of 1.25 mg/dL (H)). Liver Function Tests:  Recent Labs Lab 04/12/16 0900 04/13/16 0959  AST 33 27  ALT 34 29  ALKPHOS 80 70  BILITOT 2.4* 2.5*  PROT 7.2 6.3*  ALBUMIN 4.2 4.0   No results for input(s): LIPASE, AMYLASE in the last 168 hours. No results for input(s): AMMONIA in the last 168 hours. Coagulation Profile: No results for input(s): INR, PROTIME in the last 168 hours. Cardiac Enzymes:  Recent Labs Lab 04/12/16 0900 04/12/16 1802 04/12/16 2328 04/13/16 0959  TROPONINI 0.04* 0.05* 0.04* 0.04*   BNP (last 3 results) No results for input(s): PROBNP in the last 8760 hours. HbA1C: No results for input(s): HGBA1C in the last 72 hours. CBG: No results for input(s): GLUCAP in the last 168 hours. Lipid Profile: No results for input(s): CHOL, HDL, LDLCALC, TRIG, CHOLHDL, LDLDIRECT in the last 72 hours. Thyroid Function Tests: No results for input(s): TSH, T4TOTAL, FREET4, T3FREE, THYROIDAB in the last 72 hours. Anemia Panel: No results for input(s): VITAMINB12, FOLATE, FERRITIN, TIBC, IRON, RETICCTPCT in the last 72 hours. Urine analysis: No results found for: COLORURINE, APPEARANCEUR, LABSPEC, PHURINE, GLUCOSEU, HGBUR, BILIRUBINUR, KETONESUR, PROTEINUR, UROBILINOGEN, NITRITE, Blanchard Mane, MD, FACP, FHM. [facility] Pager [phone]  If 7PM-7AM, please contact night-coverage [email or web address] Password [email or web address] 04/16/2016, 2:56 PM

## 2016-04-16 NOTE — Care Management Important Message (Signed)
Important Message  Patient Details  Name: Christopher Benton MRN: [id number] Date of Birth: [birth year]   Medicare Important Message Given:  Yes    [name] 04/16/2016, 9:26 AM

## 2016-04-17 ENCOUNTER — Inpatient Hospital Stay (HOSPITAL_COMMUNITY): Payer: Medicare Other

## 2016-04-17 LAB — BASIC METABOLIC PANEL
ANION GAP: 9 (ref 5–15)
BUN: 17 mg/dL (ref 6–20)
CALCIUM: 9 mg/dL (ref 8.9–10.3)
CO2: 25 mmol/L (ref 22–32)
Chloride: 102 mmol/L (ref 101–111)
Creatinine, Ser: 1.15 mg/dL (ref 0.61–1.24)
GFR calc Af Amer: >60 mL/min (ref >60)
GLUCOSE: 98 mg/dL (ref 65–99)
POTASSIUM: 3.7 mmol/L (ref 3.5–5.1)
SODIUM: 136 mmol/L (ref 135–145)

## 2016-04-17 LAB — MAGNESIUM: MAGNESIUM: 2 mg/dL (ref 1.7–2.4)

## 2016-04-17 MED ORDER — POTASSIUM CHLORIDE CRYS ER 20 MEQ PO TBCR
40.0000 meq | EXTENDED_RELEASE_TABLET | Freq: Once | ORAL | Status: AC
  Administered 2016-04-17: 40 meq via ORAL
  Filled 2016-04-17: qty 2

## 2016-04-17 MED ORDER — LORATADINE 10 MG PO TABS
10.0000 mg | ORAL_TABLET | Freq: Every day | ORAL | Status: DC
  Administered 2016-04-17 – 2016-04-18 (×2): 10 mg via ORAL
  Filled 2016-04-17 (×2): qty 1

## 2016-04-17 NOTE — Care Management Note (Signed)
Case Management Note Marvetta Gibbons RN, BSN Unit 2W-Case Manager [phone]  Patient Details  Name: Christopher Benton MRN: [id number] Date of Birth: [birth year]  Subjective/Objective:   Pt admitted with Pleural effusion on right, recurrent- s/p thoracentesis                 Action/Plan: PTA pt lived at home with spouse- CM to follow for any potential d/c needs  Expected Discharge Date:    04/18/16              Expected Discharge Plan:  Home/Self Care  In-House Referral:     Discharge planning Services  CM Consult, Medication Assistance  Post Acute Care Choice:    Choice offered to:     DME Arranged:    DME Agency:     HH Arranged:    [place] Agency:     Status of Service:  Completed, signed off  If discussed at [employer] of Stay Meetings, dates discussed:   Discharge Disposition: home self care    Additional Comments:  04/17/16- 1400- Marvetta Gibbons RN, CM- f/u done with [facility] clinic- spoke with Raphael Gibney- Dr. Romero Liner will review notes- have faxed a prescription for Tikosyn to the [place]- 14 day script is on shadow chart for Discharge in the AM- [place] will have to send notes to a [place] cardiologist to approve Tikosyn- have updated pt and wife at bedside- will fax d/c summary once available to the [place]-  04/15/16- [place], CM- referral received for Tikosyn assistance- attempted benefits check- unable to find prescription coverage- spoke with pt and wife at bedside- per conversation pt states that he does not have part D or any type of prescription coverage- pt reports that he uses the [place] for medication needs- [facility]-(all new drugs will need to be approved through the PCP at the [facility]) pt goes to Dr. [name] at the [facility]- pt will need a 14 day supply of Tikosyn at time of discharge. Pt will need 14 day supply  from [place] to allow time for [facility] to [facility] drug. Call made to Noel Gerold at [facility]- discussed  pt's need for Tikosyn approval- faxed needed notes to PCP- Dr. Romero Liner at [phone]- via Epic- will [facility] with [facility] prior to discharge.   Dawayne Patricia, RN 04/17/2016, 2:04 PM

## 2016-04-17 NOTE — Progress Notes (Signed)
[facility]                                                                              Patient Demographics  Christopher Benton, is a 75 y.o. 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 He went to [place] on 03/22/16 and had chest x-ray which showed new moderate right-sided pleural effusion with right middle and lower lobe collapse / consolidation. He was treated with Lasix and antibiotics. He felt better for at least 2 weeks. Then over the last 1 week, he again felt short of breath with exertion,  2 pillow orthopnea and swelling in his feet and legs in the last 1 week. No chest pain. CT angiogram chest showed no evidence of acute pulmonary embolism. Moderate to large right-sided pleural effusion with aortic atherosclerosis. No airspace consolidation identified.   Assessment & Plan   Principal Problem:   Pleural effusion on right, recurrent - CT angiogram of the chest did not show any malignancy or pulmonary embolism however patient has recurrent pleural effusion. No smoking history - Ultrasound-guided thoracentesis, 1.3 L removed, studies transudative - Initially diuresed with IV Lasix which was transitioned to PO on 04/15/16. - Clinically improved. Maintaining oxygen saturations at target range - No malignant cells identified on pathology reports from thoracentesis    Acute on Chronic systolic CHF (congestive heart failure) ([place]) - Initially treated with IV Lasix, currently on PO Lasix (PO started on 11/15) - LVEF 35-40 percent by TEE 11/15. - As per cardiology, this was exacerbated by atrial arrhythmias and  would benefit from long-term sinus rhythm. - Improved and compensated. Per cardiology, do not expect that he will need Lasix daily at discharge, will likely need as needed dosing - as needed dosing discussed with pt and his wife at bedside, monitor weight closely - weight trend since 11/15: 250 --> 247 --> 248 lbs this AM    Essential hypertension - Continue with Coreg, losartan, Lasix.  - BP controlled on this regimen    Atypical atrial flutter/Atrial fibrillation ([place]) with RVR - Continue Coreg, rate controlled - Restarted eliquis after thoracentesis. Continue Eliquis for CHADS2VASC of 4 - After TEE negative for LAA thrombus, started Tikosyn 11/15 - Patient reverted to sinus rhythm on 11/16 - Patient to remain in the hospital until Saturday morning. BMP, magnesium and QTC stable.  - Patient will get his Tikosyn from [place] and will need 14 day supply at discharge.  - Cardiology please arrange follow-up in one week with repeat labs and EKG and four-week follow-up with Dr. Lovena Le    CAD (coronary artery disease):  - Patient had a stress echo done in August 2017 which had shown EF of 41% and no reversible perfusion defects - Troponin slightly positive, however no  chest pain - 2-D echo: Results as below. LVEF 35-40 percent. - Continue Coreg, Lasix, losartan    Acute kidney injury - Likely related to diuresis  - now resolved    Obesity  - Body mass index is 34.64 kg/m   Code Status: Full  DVT Prophylaxis:  Eliquis  Family Communication: Discussed with patient's spouse in detail at bedside. Disposition Plan:  DC home in AM  Procedures:   Thoracentesis 11/13  TEE 04/15/16: EF 35% to 40%, no evidence of thrombus in the atrial cavity or appendage.   2-D echo 04/14/16: EF 35% to 40%, diffuse hypokinesis, high ventricular filling pressures   Consultants:    Cardiology  EP Cardiology  Antimicrobials:   None  Medications:  Scheduled Meds: . apixaban  5 mg Oral BID  .  carvedilol  25 mg Oral BID  . dofetilide  500 mcg Oral BID  . dorzolamide  1 drop Both Eyes BID  . furosemide  40 mg Oral BID  . latanoprost  1 drop Both Eyes QHS  . losartan  25 mg Oral Daily  . multivitamin with minerals  1 tablet Oral Daily  . pravastatin  40 mg Oral Daily  . sodium chloride flush  3 mL Intravenous Q12H  . sodium chloride flush  3 mL Intravenous Q12H   Continuous Infusions: PRN Meds:.sodium chloride, acetaminophen, ondansetron (ZOFRAN) IV, sodium chloride flush, sodium chloride flush  Subjective:   Patient denies complaints. No palpitations, chest pain or dyspnea.  Objective:   Vitals:   04/16/16 1227 04/16/16 2100 04/17/16 0617 04/17/16 0621  BP: 104/68 112/78 121/80   Pulse: 71 77 70   Resp: 18 18 18    Temp: 97.8 F (36.6 C) 98.4 F (36.9 C) 98.4 F (36.9 C)   TempSrc: Oral Oral Oral   SpO2: 97% 96% 97%   Weight:    112.7 kg (248 lb 6.4 oz)  Height:       No intake or output data in the 24 hours ending 04/17/16 0848   Wt Readings from Last 3 Encounters:  04/17/16 112.7 kg (248 lb 6.4 oz)  03/22/16 117.9 kg (260 lb)  11/27/15 119.4 kg (263 lb 1.9 oz)   Exam  General: Alert and oriented x 3, NAD. Sitting up comfortably in bed.   Cardiovascular: NRS this AM, No JVD, murmurs or pedal edema.   Respiratory:  Clear to auscultation. No increased work of breathing. Minimal crackles at the left base noted   Gastrointestinal: Soft, nontender, nondistended, + bowel sounds  Ext: no cyanosis clubbing, Pedal edema 1+ bilaterally but L > R side   Neuro: no new deficits  Data Reviewed:  I have personally reviewed following labs and imaging studies  Micro Results Recent Results (from the past 240 hour(s))  Culture, body fluid-bottle     Status: None (Preliminary result)   Collection Time: 04/13/16 11:20 AM  Result Value Ref Range Status   Specimen Description FLUID PLEURAL RIGHT  Final   Special Requests NONE  Final   Culture NO GROWTH 3 DAYS   Final   Report Status PENDING  Incomplete  Gram stain     Status: None   Collection Time: 04/13/16 11:20 AM  Result Value Ref Range Status   Specimen Description FLUID PLEURAL RIGHT  Final   Special Requests NONE  Final   Gram Stain   Final    FEW WBC PRESENT, PREDOMINANTLY MONONUCLEAR NO ORGANISMS SEEN    Report Status 04/13/2016 FINAL  Final  Radiology Reports Dg Chest 1 View  Result Date: 04/13/2016 CLINICAL DATA:  Post right thoracentesis. EXAM: CHEST 1 VIEW COMPARISON:  04/12/2016 FINDINGS: No pneumothorax following thoracentesis. Decreasing right pleural effusion. Heart is mildly enlarged. Minimal right base atelectasis. Left lung is clear. IMPRESSION: No pneumothorax following right thoracentesis. Electronically Signed   By: Rolm Baptise M.D.   On: 04/13/2016 11:18   Dg Chest 2 View  Result Date: 04/12/2016 CLINICAL DATA:  Weakness for 3 days.  Pneumonia. EXAM: CHEST  2 VIEW COMPARISON:  March 22, 2016 FINDINGS: Persistent effusion and opacity on the right primarily involving the right middle lobe and a portion of the right lower lobe. The finding is similar to mildly improved in the interval. No other interval changes or acute abnormalities. IMPRESSION: Persistent and effusion in the right base/middle lobe. Recommend follow-up to complete resolution. Given persistence for 3 weeks, the patient may benefit from a CT scan. Electronically Signed   By: Dorise Bullion III M.D   On: 04/12/2016 09:24   Dg Chest 2 View  Result Date: 03/22/2016 CLINICAL DATA:  Acute worsening shortness of breath, atrial fibrillation. EXAM: CHEST  2 VIEW COMPARISON:  08/16/2015 FINDINGS: New moderate right pleural effusion with right middle and lower lobe collapse/ consolidation. Stable cardiomegaly without superimposed CHF or edema. Right upper lobe is clear. Stable left lung aeration. Stable left apical oval soft tissue shadow versus pleural abnormality compared 08/16/2015. This remains indeterminate.  Trachea is midline. Atherosclerosis noted of the aorta. aa IMPRESSION: New moderate right pleural effusion with right middle and lower lobe collapse/consolidation. Underlying pneumonia or mass not excluded. Stable cardiomegaly without CHF Stable indeterminate left apical soft tissue versus pleural density. Electronically Signed   By: Jerilynn Mages.  Shick M.D.   On: 03/22/2016 11:51   Ct Angio Chest Pe W Or Wo Contrast  Result Date: 04/12/2016 CLINICAL DATA:  Evaluate for pulmonary embolus. EXAM: CT ANGIOGRAPHY CHEST WITH CONTRAST TECHNIQUE: Multidetector CT imaging of the chest was performed using the standard protocol during bolus administration of intravenous contrast. Multiplanar CT image reconstructions and MIPs were obtained to evaluate the vascular anatomy. CONTRAST:  100 cc of Isovue 370 COMPARISON:  None. FINDINGS: Cardiovascular: Satisfactory opacification of the pulmonary arteries to the segmental level. No evidence of pulmonary embolism. Normal heart size. No pericardial effusion. Aortic atherosclerosis is identified. Mediastinum/Nodes: The trachea appears patent and is midline. Normal appearance of the esophagus. Prominent nodes are identified within the mediastinum. The largest is in the right paratracheal region measuring 11 mm. Lungs/Pleura: Moderate to large right pleural effusion is identified. There is overlying compressive type atelectasis within the right lower lobe. No airspace consolidation identified. Upper Abdomen: Left lobe of liver cyst measures 3.1 cm. Mild perihepatic ascites. 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 No aggressive lytic or sclerotic bone lesions. 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 Review of the MIP images confirms the above findings. IMPRESSION: 1. No evidence for acute pulmonary embolus. 2. Moderate to large right pleural effusion. 3.  Aortic atherosclerosis Electronically Signed   By: Kerby Moors M.D.   On: 04/12/2016 10:45   US Thoracentesis Asp Pleural Space W/img Guide  Result Date: 04/13/2016 INDICATION: Shortness of breath. Right-sided pleural effusion. Request thoracentesis. EXAM: ULTRASOUND GUIDED RIGHT THORACENTESIS MEDICATIONS: None. COMPLICATIONS: None immediate. Postprocedural chest x-ray negative for pneumothorax. PROCEDURE: An ultrasound guided thoracentesis was thoroughly discussed with the patient and questions answered. The  benefits, risks, alternatives and complications were also discussed. The patient understands and wishes to proceed with the procedure. Written consent was obtained. Ultrasound was performed to localize and mark an adequate pocket of fluid in the right chest. The area was then prepped and draped in the normal sterile fashion. 1% Lidocaine was used for local anesthesia. Under ultrasound guidance a Safe-T-Centesis catheter was introduced. Thoracentesis was performed. The catheter was removed and a dressing applied. FINDINGS: A total of approximately 1.3 L of hazy, yellow fluid was removed. Samples were sent to the laboratory as requested by the clinical team. IMPRESSION: Successful ultrasound guided right thoracentesis yielding 1.3 L of pleural fluid. Read by: Ascencion Dike PA-C Electronically Signed   By: Lucrezia Europe M.D.   On: 04/13/2016 11:45    Lab Data:  CBC:  Recent Labs Lab 04/12/16 0900 04/13/16 0959 04/14/16 0220 04/15/16 0322  WBC 11.5* 9.9 9.2 10.6*  NEUTROABS 7.1  --   --   --   HGB 15.9 15.8 15.1 15.2  HCT 47.6 47.9 45.7 46.3  MCV 80.1 82.7 82.3 82.8  PLT 213 199 176 014   Basic Metabolic Panel:  Recent Labs Lab 04/13/16 0959 04/14/16 0220 04/15/16 0322 04/15/16 0659 04/16/16 0241 04/17/16 0223  NA 139 138 140  --  140 136  K 3.9 3.8 4.0  --  4.0 3.7  CL 104 106 103  --  104 102  CO2 26 23 28   --  28 25  GLUCOSE 115* 104* 97  --  98 98  BUN 11 14 12   --  10 17   CREATININE 1.13 1.07 1.26*  --  1.25* 1.15  CALCIUM 9.4 8.8* 9.0  --  9.1 9.0  MG  --   --   --  2.0 1.9 2.0   Liver Function Tests:  Recent Labs Lab 04/12/16 0900 04/13/16 0959  AST 33 27  ALT 34 29  ALKPHOS 80 70  BILITOT 2.4* 2.5*  PROT 7.2 6.3*  ALBUMIN 4.2 4.0   Cardiac Enzymes:  Recent Labs Lab 04/12/16 0900 04/12/16 1802 04/12/16 2328 04/13/16 0959  TROPONINI 0.04* 0.05* 0.04* 0.04*   MAGICK-Velta Rockholt, Rebecca Eaton, MD [facility] Pager [phone]  If 7PM-7AM, please contact night-coverage [email or web address] Password [facility] 04/17/2016, 8:48 AM

## 2016-04-17 NOTE — Progress Notes (Signed)
Patient Name: Christopher Benton Date of Encounter: 04/17/2016     Principal Problem:   Pleural effusion on right Active Problems:   Essential hypertension   Atrial fibrillation (HCC)   CAD (coronary artery disease)   Chronic systolic CHF (congestive heart failure) (HCC)   Recurrent pleural effusion on right   Elevated troponin I level    SUBJECTIVE  No chest pain or sob. Maintaining NSR  CURRENT MEDS . apixaban  5 mg Oral BID  . carvedilol  25 mg Oral BID  . dofetilide  500 mcg Oral BID  . dorzolamide  1 drop Both Eyes BID  . furosemide  40 mg Oral BID  . latanoprost  1 drop Both Eyes QHS  . losartan  25 mg Oral Daily  . multivitamin with minerals  1 tablet Oral Daily  . pravastatin  40 mg Oral Daily  . sodium chloride flush  3 mL Intravenous Q12H  . sodium chloride flush  3 mL Intravenous Q12H    OBJECTIVE  Vitals:   04/16/16 1227 04/16/16 2100 04/17/16 0617 04/17/16 0621  BP: 104/68 112/78 121/80   Pulse: 71 77 70   Resp: 18 18 18    Temp: 97.8 F (36.6 C) 98.4 F (36.9 C) 98.4 F (36.9 C)   TempSrc: Oral Oral Oral   SpO2: 97% 96% 97%   Weight:    248 lb 6.4 oz (112.7 kg)  Height:       No intake or output data in the 24 hours ending 04/17/16 0839 Filed Weights   04/15/16 0912 04/16/16 0530 04/17/16 0621  Weight: 250 lb 4.8 oz (113.5 kg) 247 lb 4.8 oz (112.2 kg) 248 lb 6.4 oz (112.7 kg)    PHYSICAL EXAM  General: Pleasant, NAD. Neuro: Alert and oriented X 3. Moves all extremities spontaneously. Psych: Normal affect. HEENT:  Normal  Neck: Supple without bruits or JVD. Lungs:  Resp regular and unlabored, CTA. Heart: RRR no s3, s4, or murmurs. Abdomen: Soft, non-tender, non-distended, BS + x 4.  Extremities: No clubbing, cyanosis or edema. DP/PT/Radials 2+ and equal bilaterally.  Accessory Clinical Findings  CBC  Recent Labs  04/15/16 0322  WBC 10.6*  HGB 15.2  HCT 46.3  MCV 82.8  PLT 161   Basic Metabolic Panel  Recent Labs  04/16/16 0241 04/17/16 0223  NA 140 136  K 4.0 3.7  CL 104 102  CO2 28 25  GLUCOSE 98 98  BUN 10 17  CREATININE 1.25* 1.15  CALCIUM 9.1 9.0  MG 1.9 2.0   Liver Function Tests No results for input(s): AST, ALT, ALKPHOS, BILITOT, PROT, ALBUMIN in the last 72 hours. No results for input(s): LIPASE, AMYLASE in the last 72 hours. Cardiac Enzymes No results for input(s): CKTOTAL, CKMB, CKMBINDEX, TROPONINI in the last 72 hours. BNP Invalid input(s): POCBNP D-Dimer No results for input(s): DDIMER in the last 72 hours. Hemoglobin A1C No results for input(s): HGBA1C in the last 72 hours. Fasting Lipid Panel No results for input(s): CHOL, HDL, LDLCALC, TRIG, CHOLHDL, LDLDIRECT in the last 72 hours. Thyroid Function Tests No results for input(s): TSH, T4TOTAL, T3FREE, THYROIDAB in the last 72 hours.  Invalid input(s): FREET3  TELE  nsr  ECG  NSR with a QTC of 470 by my measure Radiology/Studies  Dg Chest 1 View  Result Date: 04/13/2016 CLINICAL DATA:  Post right thoracentesis. EXAM: CHEST 1 VIEW COMPARISON:  04/12/2016 FINDINGS: No pneumothorax following thoracentesis. Decreasing right pleural effusion. Heart is mildly enlarged. Minimal  right base atelectasis. Left lung is clear. IMPRESSION: No pneumothorax following right thoracentesis. Electronically Signed   By: Rolm Baptise M.D.   On: 04/13/2016 11:18   Dg Chest 2 View  Result Date: 04/12/2016 CLINICAL DATA:  Weakness for 3 days.  Pneumonia. EXAM: CHEST  2 VIEW COMPARISON:  March 22, 2016 FINDINGS: Persistent effusion and opacity on the right primarily involving the right middle lobe and a portion of the right lower lobe. The finding is similar to mildly improved in the interval. No other interval changes or acute abnormalities. IMPRESSION: Persistent and effusion in the right base/middle lobe. Recommend follow-up to complete resolution. Given persistence for 3 weeks, the patient may benefit from a CT scan. Electronically  Signed   By: Dorise Bullion III M.D   On: 04/12/2016 09:24   Dg Chest 2 View  Result Date: 03/22/2016 CLINICAL DATA:  Acute worsening shortness of breath, atrial fibrillation. EXAM: CHEST  2 VIEW COMPARISON:  08/16/2015 FINDINGS: New moderate right pleural effusion with right middle and lower lobe collapse/ consolidation. Stable cardiomegaly without superimposed CHF or edema. Right upper lobe is clear. Stable left lung aeration. Stable left apical oval soft tissue shadow versus pleural abnormality compared 08/16/2015. This remains indeterminate. Trachea is midline. Atherosclerosis noted of the aorta. aa IMPRESSION: New moderate right pleural effusion with right middle and lower lobe collapse/consolidation. Underlying pneumonia or mass not excluded. Stable cardiomegaly without CHF Stable indeterminate left apical soft tissue versus pleural density. Electronically Signed   By: Jerilynn Mages.  Shick M.D.   On: 03/22/2016 11:51   Ct Angio Chest Pe W Or Wo Contrast  Result Date: 04/12/2016 CLINICAL DATA:  Evaluate for pulmonary embolus. EXAM: CT ANGIOGRAPHY CHEST WITH CONTRAST TECHNIQUE: Multidetector CT imaging of the chest was performed using the standard protocol during bolus administration of intravenous contrast. Multiplanar CT image reconstructions and MIPs were obtained to evaluate the vascular anatomy. CONTRAST:  100 cc of Isovue 370 COMPARISON:  None. FINDINGS: Cardiovascular: Satisfactory opacification of the pulmonary arteries to the segmental level. No evidence of pulmonary embolism. Normal heart size. No pericardial effusion. Aortic atherosclerosis is identified. Mediastinum/Nodes: The trachea appears patent and is midline. Normal appearance of the esophagus. Prominent nodes are identified within the mediastinum. The largest is in the right paratracheal region measuring 11 mm. Lungs/Pleura: Moderate to large right pleural effusion is identified. There is overlying compressive type atelectasis within the right  lower lobe. No airspace consolidation identified. Upper Abdomen: Left lobe of liver cyst measures 3.1 cm. Mild perihepatic ascites. 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 No aggressive lytic or sclerotic bone lesions. 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 Review of the MIP images confirms the above findings. IMPRESSION: 1. No evidence for acute pulmonary embolus. 2. Moderate to large right pleural effusion. 3. Aortic atherosclerosis Electronically Signed   By: Kerby Moors M.D.   On: 04/12/2016 10:45   US Thoracentesis Asp Pleural Space W/img Guide  Result Date: 04/13/2016 INDICATION: Shortness of breath. Right-sided pleural effusion. Request thoracentesis. EXAM: ULTRASOUND GUIDED RIGHT THORACENTESIS MEDICATIONS: None. COMPLICATIONS: None immediate. Postprocedural chest x-ray negative for pneumothorax. PROCEDURE: An ultrasound guided thoracentesis was thoroughly discussed with the patient and questions answered. The benefits, risks, alternatives and complications were also discussed. The patient understands and wishes to proceed with the procedure. Written consent was obtained. Ultrasound was performed to localize and mark an adequate pocket of fluid in the right  chest. The area was then prepped and draped in the normal sterile fashion. 1% Lidocaine was used for local anesthesia. Under ultrasound guidance a Safe-T-Centesis catheter was introduced. Thoracentesis was performed. The catheter was removed and a dressing applied. FINDINGS: A total of approximately 1.3 L of hazy, yellow fluid was removed. Samples were sent to the laboratory as requested by the clinical team. IMPRESSION: Successful ultrasound guided right thoracentesis yielding 1.3 L of pleural fluid. Read by: Ascencion Dike PA-C Electronically Signed   By: Lucrezia Europe M.D.   On: 04/13/2016 11:45    ASSESSMENT  AND PLAN  1. Atrial fib/flutter 2. Initiation of Tikosyn therapy - conversion to NSR, now doing well. QTC ok but near upper limit of acceptability 3. Acute on chronic systolic heart failure -  He appears euvolemic at this time. Would dc on low dose lasix although we may be able to reduce to every other day or as needed based on weight 4. Pleural effusion - likely a consequence of CHF.  Carleene Overlie Eunique Balik,M.D.  04/17/2016 8:39 AMPatient ID: Christopher Benton, male   DOB: [birth year], 75 y.o.   MRN: [id number]

## 2016-04-18 ENCOUNTER — Other Ambulatory Visit: Payer: Self-pay | Admitting: Physician Assistant

## 2016-04-18 DIAGNOSIS — Z5181 Encounter for therapeutic drug level monitoring: Secondary | ICD-10-CM

## 2016-04-18 DIAGNOSIS — Z79899 Other long term (current) drug therapy: Principal | ICD-10-CM

## 2016-04-18 LAB — BASIC METABOLIC PANEL
Anion gap: 9 (ref 5–15)
BUN: 15 mg/dL (ref 6–20)
CALCIUM: 9 mg/dL (ref 8.9–10.3)
CO2: 25 mmol/L (ref 22–32)
CREATININE: 1.05 mg/dL (ref 0.61–1.24)
Chloride: 103 mmol/L (ref 101–111)
GFR calc non Af Amer: >60 mL/min (ref >60)
Glucose, Bld: 105 mg/dL — ABNORMAL HIGH (ref 65–99)
Potassium: 3.6 mmol/L (ref 3.5–5.1)
SODIUM: 137 mmol/L (ref 135–145)

## 2016-04-18 LAB — CULTURE, BODY FLUID W GRAM STAIN -BOTTLE

## 2016-04-18 LAB — CULTURE, BODY FLUID-BOTTLE: CULTURE: NO GROWTH

## 2016-04-18 LAB — MAGNESIUM: Magnesium: 2 mg/dL (ref 1.7–2.4)

## 2016-04-18 MED ORDER — POTASSIUM CHLORIDE CRYS ER 10 MEQ PO TBCR: 10.0000 meq | EXTENDED_RELEASE_TABLET | Freq: Every day | ORAL | 0 refills | Status: DC

## 2016-04-18 MED ORDER — POTASSIUM CHLORIDE CRYS ER 20 MEQ PO TBCR
40.0000 meq | EXTENDED_RELEASE_TABLET | ORAL | Status: AC
  Administered 2016-04-18 (×2): 40 meq via ORAL
  Filled 2016-04-18 (×2): qty 2

## 2016-04-18 MED ORDER — DOFETILIDE 500 MCG PO CAPS: 500.0000 ug | ORAL_CAPSULE | Freq: Two times a day (BID) | ORAL | 0 refills | Status: DC

## 2016-04-18 MED ORDER — FUROSEMIDE 40 MG PO TABS: 40.0000 mg | ORAL_TABLET | Freq: Every day | ORAL | 0 refills | Status: DC | PRN

## 2016-04-18 MED ORDER — FUROSEMIDE 40 MG PO TABS: 40.0000 mg | ORAL_TABLET | Freq: Every day | ORAL | Status: DC

## 2016-04-18 MED ORDER — SPIRONOLACTONE 25 MG PO TABS
12.5000 mg | ORAL_TABLET | Freq: Every day | ORAL | Status: DC
  Administered 2016-04-18: 12.5 mg via ORAL
  Filled 2016-04-18: qty 1

## 2016-04-18 MED ORDER — SPIRONOLACTONE 25 MG PO TABS: 12.5000 mg | ORAL_TABLET | Freq: Every day | ORAL | 0 refills | Status: DC

## 2016-04-18 NOTE — Progress Notes (Signed)
Patient Name: Christopher Benton      SUBJECTIVE feels better  lessSOB  Admitted with CHF and atrial fibrillation in context of known  NICM;  R pleural effusion present for which he underwent thioracentesis   Following TEE >>EF 35-40%, LVH BAE  Started on dofetilide with conversion to sinus rhythm now 72 hrs   He has hx of  Past Medical History:  Diagnosis Date  . Atrial fibrillation ([place])   . CAD (coronary artery disease)   . Cardiomyopathy ([place])   . Chronic systolic CHF (congestive heart failure) ([place]) 04/12/2016  . Dysrhythmia Atrial flutter  . Hypertension     Scheduled Meds:  Scheduled Meds: . apixaban  5 mg Oral BID  . carvedilol  25 mg Oral BID  . dofetilide  500 mcg Oral BID  . dorzolamide  1 drop Both Eyes BID  . furosemide  40 mg Oral BID  . latanoprost  1 drop Both Eyes QHS  . loratadine  10 mg Oral Daily  . losartan  25 mg Oral Daily  . multivitamin with minerals  1 tablet Oral Daily  . pravastatin  40 mg Oral Daily  . sodium chloride flush  3 mL Intravenous Q12H  . sodium chloride flush  3 mL Intravenous Q12H   Continuous Infusions: sodium chloride, acetaminophen, ondansetron (ZOFRAN) IV, sodium chloride flush, sodium chloride flush    PHYSICAL EXAM Vitals:   04/17/16 0621 04/17/16 1449 04/17/16 2231 04/18/16 0600  BP:  107/80 117/86 114/75  Pulse:  72 72 72  Resp:  18 18 16   Temp:  99 F (37.2 C) 98.3 F (36.8 C) 98.4 F (36.9 C)  TempSrc:  Oral Oral Oral  SpO2:  97% 96% 93%  Weight: 248 lb 6.4 oz (112.7 kg)   247 lb 9.6 oz (112.3 kg)  Height:       Well developed and nourished in no acute distress HENT normal Neck supple  flat Clear Regular rate and rhythm, no murmurs or gallops Abd-soft with active BS No Clubbing cyanosis edema Skin-warm and dry A & Oriented  Grossly normal sensory and motor function   Telemetry Personally reviewed  NSR  ECG personally reviewed QTc 490    Intake/Output Summary (Last 24 hours) at  04/18/16 0746 Last data filed at 04/18/16 0741  Gross per 24 hour  Intake                0 ml  Output              650 ml  Net             -650 ml    LABS: Basic Metabolic Panel:  Recent Labs Lab 04/12/16 0900 04/13/16 0959 04/14/16 0220 04/15/16 0322  04/16/16 0241 04/17/16 0223 04/18/16 0315  NA 136 139 138 140  --  140 136 137  K 4.1 3.9 3.8 4.0  --  4.0 3.7 3.6  CL 104 104 106 103  --  104 102 103  CO2 25 26 23 28   --  28 25 25   GLUCOSE 120* 115* 104* 97  --  98 98 105*  BUN 13 11 14 12   --  10 17 15   CREATININE 1.10 1.13 1.07 1.26*  --  1.25* 1.15 1.05  CALCIUM 9.4 9.4 8.8* 9.0  --  9.1 9.0 9.0  MG  --   --   --   --   < > 1.9 2.0 2.0  < > =  values in this interval not displayed. Cardiac Enzymes: No results for input(s): CKTOTAL, CKMB, CKMBINDEX, TROPONINI in the last 72 hours. CBC:  Recent Labs Lab 04/12/16 0900 04/13/16 0959 04/14/16 0220 04/15/16 0322  WBC 11.5* 9.9 9.2 10.6*  NEUTROABS 7.1  --   --   --   HGB 15.9 15.8 15.1 15.2  HCT 47.6 47.9 45.7 46.3  MCV 80.1 82.7 82.3 82.8  PLT 213 199 176 182   PROTIME: No results for input(s): LABPROT, INR in the last 72 hours. Liver Function Tests: No results for input(s): AST, ALT, ALKPHOS, BILITOT, PROT, ALBUMIN in the last 72 hours. No results for input(s): LIPASE, AMYLASE in the last 72 hours. BNP: BNP (last 3 results)  Recent Labs  08/16/15 1955 03/22/16 1155 04/12/16 0900  BNP 226.5* 473.3* 373.4*    *   ASSESSMENT AND PLAN:  Principal Problem:   Pleural effusion on right Active Problems:   Essential hypertension   Atrial fibrillation (HCC)   CAD (coronary artery disease)   Chronic systolic CHF (congestive heart failure) (HCC)   Recurrent pleural effusion on right   Elevated troponin I level High Risk Medication Surveillance Hypokalemia new  Will require K repletion Will anticipate discharge later today if ok with hospitalist Is there any reason not for aldctone   Will help with  cardiomyopathy and hypokalemia  Will begin decreae furosemide dailoy Will need BMET on wed  Will arrange  Arrange Afib clinic dfollowup week after thksgivng     SignedVirl Axe MD  04/18/2016

## 2016-04-18 NOTE — Progress Notes (Signed)
Received request from Dr. Caryl Comes to schedule close f/u for this patient with BMET on Wednesday. Sent message to [employer] and EP scheduler to arrange. Office will call pt with this information. Already has f/u entered in his chart for Christopher Benton on 04/27/16.   Dayna Dunn PA-C

## 2016-04-18 NOTE — Discharge Summary (Signed)
Physician Discharge Summary  Christopher Benton GQQ:[id number] DOB: 1941-02-04 DOA: 04/12/2016  PCP: Pcp Not In System  Admit date: 04/12/2016 Discharge date: 04/18/2016  Recommendations for Outpatient Follow-up:  1. Pt will need to follow up with PCP in 1-2 weeks post discharge 2. Please obtain BMP to evaluate electrolytes and kidney function 3. Please also check CBC to evaluate Hg and Hct levels  Discharge Diagnoses:  Principal Problem:   Pleural effusion on right Active Problems:   Essential hypertension  Discharge Condition: Stable  Diet recommendation: Heart healthy diet discussed in details   Brief summary   75 year old male with CAD, atrial flutter on eliquis, cardiomyopathy with EF of 45%, hypertension who [facility] short[facility] of breath which has been progressively worsening over the past one month. He went to [place] 03/22/16 and had chest x-ray which showed new moderate right-sided pleural effusion with right middle and lower lobe collapse / consolidation. He was treated with Lasix and antibiotics. He felt better for at least 2 weeks. Then over the last 1 week, he again felt short of breath with exertion,  2 pillow orthopnea and swelling in his feet and legs in the last 1 week. No chest pain. CT angiogram chest showed no evidence of acute pulmonary embolism. Moderate to large right-sided pleural effusion with aortic atherosclerosis. No airspace consolidation identified.   Assessment & Plan   Principal Problem: Pleural effusion on right, recurrent - CT angiogram of the chest did not show any malignancy or pulmonary embolism however patient has recurrent pleural effusion. No smoking history - Ultrasound-guided thoracentesis, 1.3 L removed, studies transudative - Initially diuresed with IV Lasix which was transitioned to PO on 04/15/16. - Clinically improved. Maintaining oxygen saturations at target range - No malignant cells  identified on pathology reports from thoracentesis  - repeat CXR indicates no reaccumulation of pleural fluid on the right sife  Acute on Chronic systolic CHF (congestive heart failure) ([place]) - Initially treated with IV Lasix, currently on PO Lasix (PO started on 11/15) - LVEF 35-40 percent by TEE 11/15. - As per cardiology, this was exacerbated by atrial arrhythmias and would benefit from long-term sinus rhythm. - Improved and compensated. Per cardiology, do not expect that he will need Lasix daily at discharge, will likely need as needed dosing, this was confirmed and discussed with pt and his wife at bedside  - weight trend since 11/15: 250 --> 247 --> 248 --> 247 lbs this AM  Essential hypertension - Continue with Coreg, losartan, Lasix.  - BP controlled on this regimen   Atypical atrial flutter/Atrial fibrillation (HCC)with RVR - Continue Coreg, rate controlled - Restarted eliquis after thoracentesis. Continue Eliquis for CHADS2VASC of 4 - After TEE negative for LAA thrombus, started Tikosyn 11/15 - Patient reverted to sinus rhythm on 11/16 - Patient will get his Tikosyn from [place] and will need 14 day supply at discharge.  - Cardiology to arrange follow-up in one week with repeat labs and EKG and four-week follow-up with Dr. Lovena Le  CAD (coronary artery disease): - Patient had a stress echo done in August 2017 which had shown EF of 41% and no reversible perfusion defects - Troponin slightly positive, however no chest pain - 2-D echo: Results as below. LVEF 35-40 percent. - Continue Coreg, Lasix, losartan    Acute kidney injury - Likely related to diuresis  - now resolved    Obesity  - Body mass index is 34.64 kg/m   Code  Status: Full  DVT Prophylaxis:  Eliquis  Family Communication: Discussed with patient's spouse in detail at bedside. Disposition Plan:  DC home   Procedures:   Thoracentesis 11/13  TEE 04/15/16: EF 35% to 40%, no evidence of thrombus  in the atrial cavity or appendage.   2-D echo 04/14/16: EF 35% to 40%, diffuse hypokinesis, high ventricular filling pressures   Consultants:    Cardiology  EP Cardiology  Antimicrobials:   None   Procedures/Studies: Dg Chest 1 View  Result Date: 04/13/2016 CLINICAL DATA:  Post right thoracentesis. EXAM: CHEST 1 VIEW COMPARISON:  04/12/2016 FINDINGS: No pneumothorax following thoracentesis. Decreasing right pleural effusion. Heart is mildly enlarged. Minimal right base atelectasis. Left lung is clear. IMPRESSION: No pneumothorax following right thoracentesis. Electronically Signed   By: Rolm Baptise M.D.   On: 04/13/2016 11:18   Dg Chest 2 View  Result Date: 04/17/2016 CLINICAL DATA:  CHF, coronary artery disease, atrial fibrillation, cardiomyopathy. EXAM: CHEST  2 VIEW COMPARISON:  Portable chest x-ray of April 13, 2016 FINDINGS: The lungs are adequately inflated. There is no focal infiltrate. There is no pleural effusion or pneumothorax. The cardiac silhouette is enlarged. The pulmonary vascularity is normal. There is calcification in the thoracic aortic arch. The observed bony thorax exhibits no acute abnormality. IMPRESSION: No reaccumulation of pleural fluid on the right. No right-sided pneumothorax. Stable cardiomegaly without pulmonary edema. Thoracic aortic atherosclerosis. Electronically Signed   By: David  Martinique M.D.   On: 04/17/2016 10:00   Dg Chest 2 View  Result Date: 04/12/2016 CLINICAL DATA:  Weakness for 3 days.  Pneumonia. EXAM: CHEST  2 VIEW COMPARISON:  March 22, 2016 FINDINGS: Persistent effusion and opacity on the right primarily involving the right middle lobe and a portion of the right lower lobe. The finding is similar to mildly improved in the interval. No other interval changes or acute abnormalities. IMPRESSION: Persistent and effusion in the right base/middle lobe. Recommend follow-up to complete resolution. Given persistence for 3 weeks, the patient  may benefit from a CT scan. Electronically Signed   By: Dorise Bullion III M.D   On: 04/12/2016 09:24   Dg Chest 2 View  Result Date: 03/22/2016 CLINICAL DATA:  Acute worsening shortness of breath, atrial fibrillation. EXAM: CHEST  2 VIEW COMPARISON:  08/16/2015 FINDINGS: New moderate right pleural effusion with right middle and lower lobe collapse/ consolidation. Stable cardiomegaly without superimposed CHF or edema. Right upper lobe is clear. Stable left lung aeration. Stable left apical oval soft tissue shadow versus pleural abnormality compared 08/16/2015. This remains indeterminate. Trachea is midline. Atherosclerosis noted of the aorta. aa IMPRESSION: New moderate right pleural effusion with right middle and lower lobe collapse/consolidation. Underlying pneumonia or mass not excluded. Stable cardiomegaly without CHF Stable indeterminate left apical soft tissue versus pleural density. Electronically Signed   By: Jerilynn Mages.  Shick M.D.   On: 03/22/2016 11:51   Ct Angio Chest Pe W Or Wo Contrast  Result Date: 04/12/2016 CLINICAL DATA:  Evaluate for pulmonary embolus. EXAM: CT ANGIOGRAPHY CHEST WITH CONTRAST TECHNIQUE: Multidetector CT imaging of the chest was performed using the standard protocol during bolus administration of intravenous contrast. Multiplanar CT image reconstructions and MIPs were obtained to evaluate the vascular anatomy. CONTRAST:  100 cc of Isovue 370 COMPARISON:  None. FINDINGS: Cardiovascular: Satisfactory opacification of the pulmonary arteries to the segmental level. No evidence of pulmonary embolism. Normal heart size. No pericardial effusion. Aortic atherosclerosis is identified. Mediastinum/Nodes: The trachea appears patent and is midline. Normal appearance  of the esophagus. Prominent nodes are identified within the mediastinum. The largest is in the right paratracheal region measuring 11 mm. Lungs/Pleura: Moderate to large right pleural effusion is identified. There is overlying  compressive type atelectasis within the right lower lobe. No airspace consolidation identified. Upper Abdomen: Left lobe of liver cyst measures 3.1 cm. Mild perihepatic ascites. 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 No aggressive lytic or sclerotic bone lesions. 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 Review of the MIP images confirms the above findings. IMPRESSION: 1. No evidence for acute pulmonary embolus. 2. Moderate to large right pleural effusion. 3. Aortic atherosclerosis Electronically Signed   By: Kerby Moors M.D.   On: 04/12/2016 10:45   US Thoracentesis Asp Pleural Space W/img Guide  Result Date: 04/13/2016 INDICATION: Shortness of breath. Right-sided pleural effusion. Request thoracentesis. EXAM: ULTRASOUND GUIDED RIGHT THORACENTESIS MEDICATIONS: None. COMPLICATIONS: None immediate. Postprocedural chest x-ray negative for pneumothorax. PROCEDURE: An ultrasound guided thoracentesis was thoroughly discussed with the patient and questions answered. The benefits, risks, alternatives and complications were also discussed. The patient understands and wishes to proceed with the procedure. Written consent was obtained. Ultrasound was performed to localize and mark an adequate pocket of fluid in the right chest. The area was then prepped and draped in the normal sterile fashion. 1% Lidocaine was used for local anesthesia. Under ultrasound guidance a Safe-T-Centesis catheter was introduced. Thoracentesis was performed. The catheter was removed and a dressing applied. FINDINGS: A total of approximately 1.3 L of hazy, yellow fluid was removed. Samples were sent to the laboratory as requested by the clinical team. IMPRESSION: Successful ultrasound guided right thoracentesis yielding 1.3 L of pleural fluid. Read by: Ascencion Dike PA-C Electronically Signed   By: Lucrezia Europe  M.D.   On: 04/13/2016 11:45    Discharge Exam: Vitals:   04/17/16 2231 04/18/16 0600  BP: 117/86 114/75  Pulse: 72 72  Resp: 18 16  Temp: 98.3 F (36.8 C) 98.4 F (36.9 C)   Vitals:   04/17/16 0621 04/17/16 1449 04/17/16 2231 04/18/16 0600  BP:  107/80 117/86 114/75  Pulse:  72 72 72  Resp:  18 18 16   Temp:  99 F (37.2 C) 98.3 F (36.8 C) 98.4 F (36.9 C)  TempSrc:  Oral Oral Oral  SpO2:  97% 96% 93%  Weight: 112.7 kg (248 lb 6.4 oz)   112.3 kg (247 lb 9.6 oz)  Height:        General: Pt is alert, follows commands appropriately, not in acute distress Cardiovascular: Regular rate and rhythm, S1/S2 +, no rubs, no gallops Respiratory: Clear to auscultation bilaterally, no wheezing, no crackles, no rhonchi Abdominal: Soft, non tender, non distended, bowel sounds +, no guarding Extremities: +1 bilateral LE edema, no cyanosis, pulses palpable bilaterally DP and PT Neuro: Grossly nonfocal  Discharge Instructions  Discharge Instructions    Diet - low sodium heart healthy    Complete by:  As directed    Increase activity slowly    Complete by:  As directed        Medication List    STOP taking these medications   candesartan 4 MG tablet Commonly known as:  ATACAND   levofloxacin 750 MG tablet Commonly known as:  LEVAQUIN     TAKE these medications   apixaban 5 MG Tabs tablet Commonly known as:  ELIQUIS Take 1 tablet (5 mg  total) by mouth 2 (two) times daily.   carvedilol 25 MG tablet Commonly known as:  COREG Take 1 tablet (25 mg total) by mouth 2 (two) times daily.   dofetilide 500 MCG capsule Commonly known as:  TIKOSYN Take 1 capsule (500 mcg total) by mouth 2 (two) times daily.   dorzolamide 2 % ophthalmic solution Commonly known as:  TRUSOPT Place 1 drop into both eyes 2 (two) times daily.   furosemide 40 MG tablet Commonly known as:  LASIX Take 1 tablet (40 mg total) by mouth daily as needed. Take one tablet as needed if you notice weight gain  of >3 lbs in 24-48 hours and also notify your cardiologist or primary doctor. What changed:  when to take this  reasons to take this  additional instructions   latanoprost 0.005 % ophthalmic solution Commonly known as:  XALATAN Place 1 drop into both eyes at bedtime.   losartan 25 MG tablet Commonly known as:  COZAAR Take 12.5 mg by mouth daily.   multivitamin with minerals tablet Take 1 tablet by mouth daily.   potassium chloride 10 MEQ tablet Commonly known as:  K-DUR,KLOR-CON Take 1 tablet (10 mEq total) by mouth daily.   pravastatin 80 MG tablet Commonly known as:  PRAVACHOL Take 40 mg by mouth daily.   spironolactone 25 MG tablet Commonly known as:  ALDACTONE Take 0.5 tablets (12.5 mg total) by mouth daily.       Follow-up Information    [facility] Follow up on 04/27/2016.   Specialty:  Cardiology Why:  at 10:30AM (no earlier appointments available that day). See discharge instructions for directions.  Contact information: [address] [id number] [place] L[place], MD Follow up on 06/08/2016.   Specialty:  Cardiology Why:  at 8:30AM Contact information: [address] [place] [address] [phone]        Cristopher Peru, MD Follow up on 07/17/2016.   Specialty:  Cardiology Why:  at [place] information: [place] [address] [phone]        [place] [facility] Follow up.   Specialty:  Cardiology Why:  You will need labwork drawn on [facility] 04/22/16. The office will be calling you with instructions about when to arrive. If you do not hear anything back from the office by Monday afternoon, please call. Contact information: [address], [place] [phone]       Faye Ramsay, MD Follow up.   Specialty:  Internal Medicine Why:  call my  cell phone with questions [phone] Contact information: [address] [place] [address] [phone]            The results of significant diagnostics from this hospitalization (including imaging, microbiology, ancillary and laboratory) are listed below for reference.     Microbiology: Recent Results (from the past 240 hour(s))  Culture, body fluid-bottle     Status: None   Collection Time: 04/13/16 11:20 AM  Result Value Ref Range Status   Specimen Description FLUID PLEURAL RIGHT  Final   Special Requests NONE  Final   Culture NO GROWTH 5 DAYS  Final   Report Status 04/18/2016 FINAL  Final  Gram stain     Status: None   Collection Time: 04/13/16 11:20 AM  Result Value Ref Range Status   Specimen Description FLUID PLEURAL RIGHT  Final   Special Requests NONE  Final   Gram Stain   Final    FEW WBC PRESENT, PREDOMINANTLY MONONUCLEAR NO ORGANISMS SEEN    Report Status 04/13/2016 FINAL  Final     Labs: Basic Metabolic Panel:  Recent Labs Lab 04/14/16 0220 04/15/16 0322 04/15/16 0659 04/16/16 0241 04/17/16 0223 04/18/16 0315  NA 138 140  --  140 136 137  K 3.8 4.0  --  4.0 3.7 3.6  CL 106 103  --  104 102 103  CO2 23 28  --  28 25 25   GLUCOSE 104* 97  --  98 98 105*  BUN 14 12  --  10 17 15   CREATININE 1.07 1.26*  --  1.25* 1.15 1.05  CALCIUM 8.8* 9.0  --  9.1 9.0 9.0  MG  --   --  2.0 1.9 2.0 2.0   Liver Function Tests:  Recent Labs Lab 04/12/16 0900 04/13/16 0959  AST 33 27  ALT 34 29  ALKPHOS 80 70  BILITOT 2.4* 2.5*  PROT 7.2 6.3*  ALBUMIN 4.2 4.0   CBC:  Recent Labs Lab 04/12/16 0900 04/13/16 0959 04/14/16 0220 04/15/16 0322  WBC 11.5* 9.9 9.2 10.6*  NEUTROABS 7.1  --   --   --   HGB 15.9 15.8 15.1 15.2  HCT 47.6 47.9 45.7 46.3  MCV 80.1 82.7 82.3 82.8  PLT 213 199 176 182   Cardiac Enzymes:  Recent Labs Lab 04/12/16 0900 04/12/16 1802 04/12/16 2328 04/13/16 0959  TROPONINI 0.04* 0.05* 0.04* 0.04*    BNP: BNP (last 3 results)  Recent Labs  08/16/15 1955 03/22/16 1155 04/12/16 0900  BNP 226.5* 473.3* 373.4*   SIGNED: Time coordinating discharge: 30 minutes  MAGICK-Kuuipo Anzaldo, MD  [facility] 04/18/2016, 11:04 AM Pager [phone]  If 7PM-7AM, please contact night-coverage [email or web address] Password [email or web address]

## 2016-04-20 NOTE — Progress Notes (Signed)
Call received from Raphael Gibney- at [facility]- for f/u with pts need for Tikosyn approval from [place]- she is going to forward a msg. To pt's PCP - Dr. Romero Liner again regarding need for Tikosyn approval- have faxed- last cards note and d/c summary to Dr. Romero Liner at the [facility]- via epic to [phone]

## 2016-04-22 ENCOUNTER — Other Ambulatory Visit: Payer: Self-pay | Admitting: *Deleted

## 2016-04-22 DIAGNOSIS — Z5181 Encounter for therapeutic drug level monitoring: Secondary | ICD-10-CM

## 2016-04-22 DIAGNOSIS — Z79899 Other long term (current) drug therapy: Principal | ICD-10-CM

## 2016-04-22 LAB — BASIC METABOLIC PANEL
BUN: 17 mg/dL (ref 7–25)
CALCIUM: 9.3 mg/dL (ref 8.6–10.3)
CHLORIDE: 103 mmol/L (ref 98–110)
CO2: 27 mmol/L (ref 20–31)
CREATININE: 1.2 mg/dL — AB (ref 0.70–1.18)
Glucose, Bld: 131 mg/dL — ABNORMAL HIGH (ref 65–99)
Potassium: 4.4 mmol/L (ref 3.5–5.3)
Sodium: 139 mmol/L (ref 135–146)

## 2016-04-27 ENCOUNTER — Inpatient Hospital Stay (HOSPITAL_COMMUNITY): Admit: 2016-04-27 | Payer: Medicare Other | Admitting: Nurse Practitioner

## 2016-04-28 ENCOUNTER — Encounter (HOSPITAL_COMMUNITY): Payer: Self-pay | Admitting: Nurse Practitioner

## 2016-04-28 ENCOUNTER — Ambulatory Visit (HOSPITAL_COMMUNITY)
Admission: RE | Admit: 2016-04-28 | Discharge: 2016-04-28 | Disposition: A | Discharge status: Home / Self Care | Payer: Non-veteran care | Source: Ambulatory Visit | Attending: Nurse Practitioner | Admitting: Nurse Practitioner

## 2016-04-28 VITALS — BP 112/80 | HR 72 | Ht 71.0 in | Wt 244.0 lb

## 2016-04-28 DIAGNOSIS — I429 Cardiomyopathy, unspecified: Secondary | ICD-10-CM | POA: Insufficient documentation

## 2016-04-28 DIAGNOSIS — I5022 Chronic systolic (congestive) heart failure: Secondary | ICD-10-CM | POA: Insufficient documentation

## 2016-04-28 DIAGNOSIS — Z87891 Personal history of nicotine dependence: Secondary | ICD-10-CM | POA: Diagnosis not present

## 2016-04-28 DIAGNOSIS — I110 Hypertensive heart disease with heart failure: Secondary | ICD-10-CM | POA: Insufficient documentation

## 2016-04-28 DIAGNOSIS — I4891 Unspecified atrial fibrillation: Secondary | ICD-10-CM

## 2016-04-28 DIAGNOSIS — I2510 Atherosclerotic heart disease of native coronary artery without angina pectoris: Secondary | ICD-10-CM | POA: Insufficient documentation

## 2016-04-28 DIAGNOSIS — I10 Essential (primary) hypertension: Secondary | ICD-10-CM | POA: Diagnosis not present

## 2016-04-28 DIAGNOSIS — I4892 Unspecified atrial flutter: Secondary | ICD-10-CM | POA: Diagnosis not present

## 2016-04-28 LAB — BASIC METABOLIC PANEL
Anion gap: 7 (ref 5–15)
BUN: 17 mg/dL (ref 6–20)
CHLORIDE: 105 mmol/L (ref 101–111)
CO2: 24 mmol/L (ref 22–32)
Calcium: 9.6 mg/dL (ref 8.9–10.3)
Creatinine, Ser: 1.14 mg/dL (ref 0.61–1.24)
GFR calc non Af Amer: >60 mL/min (ref >60)
Glucose, Bld: 122 mg/dL — ABNORMAL HIGH (ref 65–99)
Potassium: 4.3 mmol/L (ref 3.5–5.1)
Sodium: 136 mmol/L (ref 135–145)

## 2016-04-28 LAB — CBC
HEMATOCRIT: 50.8 % (ref 39.0–52.0)
HEMOGLOBIN: 17.4 g/dL — AB (ref 13.0–17.0)
MCH: 28.2 pg (ref 26.0–34.0)
MCHC: 34.3 g/dL (ref 30.0–36.0)
MCV: 82.5 fL (ref 78.0–100.0)
Platelets: 224 10*3/uL (ref 150–400)
RBC: 6.16 MIL/uL — ABNORMAL HIGH (ref 4.22–5.81)
RDW: 13.7 % (ref 11.5–15.5)
WBC: 10.6 10*3/uL — AB (ref 4.0–10.5)

## 2016-04-28 LAB — MAGNESIUM: Magnesium: 1.9 mg/dL (ref 1.7–2.4)

## 2016-04-28 MED ORDER — DOFETILIDE 500 MCG PO CAPS: 500.0000 ug | ORAL_CAPSULE | Freq: Two times a day (BID) | ORAL | 3 refills | Status: DC

## 2016-04-28 MED ORDER — SPIRONOLACTONE 25 MG PO TABS: 12.5000 mg | ORAL_TABLET | Freq: Every day | ORAL | 3 refills | Status: DC

## 2016-04-28 MED ORDER — DOFETILIDE 500 MCG PO CAPS: 500.0000 ug | ORAL_CAPSULE | Freq: Two times a day (BID) | ORAL | 0 refills | Status: DC

## 2016-04-28 MED FILL — SPIRONOLACTONE 25 MG TABLET: 25 | 30 days supply | Qty: 30 | Fill #0

## 2016-04-28 NOTE — Progress Notes (Signed)
Primary Care Physician: [facility] Cardiologist: Christopher Benton is a 75 y.o. male   that was hospitalized 11/12 thru 11/18 with EF of 45%, hypertension who presented from home to [place] EDwith shortness of breath which has been progressively worsening over the past one month. He went to [facility] 03/22/16 and had chest x-ray which showed new moderate right-sided pleural effusion with right middle and lower lobe collapse / consolidation. He was treated with Lasix and antibiotics. He felt better for at least 2 weeks. Then over the last 1 week, he again felt short of breath with exertion, 2 pillow orthopnea and swelling in his feet and legs in the last 1 week. No chest pain. CT angiogram chest showed no evidence of acute pulmonary embolism. Moderate to large right-sided pleural effusion with aortic atherosclerosis. No airspace consolidation identified.  He underwent u/s guided thoracentesis with 1.3L removed. Initially diuresed with IV lasix and transitioned to po, now uses as needed.  EF of 35-40% thought to be exacerbated by atrial arrhythmia and decision was made to load on tikosyn and was d/c in SR. Weight on d/c 247 lbs and today at 244.   He has been staying in [place]. He is trying to get into the [place] to get his Tikosyn but has not yet secured new appointment to establish with  cardiology. He is taking Tikosyn correctly. Shortness of breath and ankle edema much improved.  Today, he denies symptoms of palpitations, chest pain, shortness of breath, orthopnea, PND, lower extremity edema, dizziness, presyncope, syncope, or neurologic sequela. The patient is tolerating medications without difficulties and is otherwise without complaint today.   Past Medical History:  Diagnosis Date  . Atrial fibrillation ([place])   . CAD (coronary artery disease)   . Cardiomyopathy ([place])   . Chronic systolic CHF (congestive heart failure) ([place]) 04/12/2016  . Dysrhythmia Atrial flutter    . Hypertension    Past Surgical History:  Procedure Laterality Date  . CARDIOVERSION N/A 09/04/2015   Procedure: CARDIOVERSION;  Surgeon: Skeet Latch, MD;  Location: [facility];  Service: Cardiovascular;  Laterality: N/A;  . TEE WITHOUT CARDIOVERSION N/A 09/04/2015   Procedure: TRANSESOPHAGEAL ECHOCARDIOGRAM (TEE);  Surgeon: Skeet Latch, MD;  Location: [facility];  Service: Cardiovascular;  Laterality: N/A;  . TEE WITHOUT CARDIOVERSION N/A 04/15/2016   Procedure: TRANSESOPHAGEAL ECHOCARDIOGRAM (TEE);  Surgeon: Jerline Pain, MD;  Location[facility] ENDOSCOPY;  Service: Cardiovascular;  Laterality: N/A;  . TOE AMPUTATION      Current Outpatient Prescriptions  Medication Sig Dispense Refill  . apixaban (ELIQUIS) 5 MG TABS tablet Take 1 tablet (5 mg total) by mouth 2 (two) times daily. 60 tablet 6  . carvedilol (COREG) 25 MG tablet Take 1 tablet (25 mg total) by mouth 2 (two) times daily. 180 tablet 3  . dofetilide (TIKOSYN) 500 MCG capsule Take 1 capsule (500 mcg total) by mouth 2 (two) times daily. 60 capsule 3  . dofetilide (TIKOSYN) 500 MCG capsule Take 1 capsule (500 mcg total) by mouth 2 (two) times daily. 60 capsule 0  . dorzolamide (TRUSOPT) 2 % ophthalmic solution Place 1 drop into both eyes 2 (two) times daily.    . furosemide (LASIX) 40 MG tablet Take 1 tablet (40 mg total) by mouth daily as needed. Take one tablet as needed if you notice weight gain of >3 lbs in 24-48 hours and also notify your cardiologist or primary doctor. 30 tablet 0  . latanoprost (XALATAN) 0.005 %  ophthalmic solution Place 1 drop into both eyes at bedtime.    Marland Kitchen losartan (COZAAR) 25 MG tablet Take 12.5 mg by mouth daily.     . Multiple Vitamins-Minerals (MULTIVITAMIN WITH MINERALS) tablet Take 1 tablet by mouth daily.    . potassium chloride SA (K-DUR,KLOR-CON) 10 MEQ tablet Take 1 tablet (10 mEq total) by mouth daily. 30 tablet 0  . pravastatin (PRAVACHOL) 80 MG tablet Take 40 mg by mouth daily.    Marland Kitchen  spironolactone (ALDACTONE) 25 MG tablet Take 0.5 tablets (12.5 mg total) by mouth daily. 30 tablet 3   No current facility-administered medications for this encounter.     Allergies  Allergen Reactions  . Pradaxa [Dabigatran Etexilate Mesylate] Nausea Only    Social History   Social History  . Marital status: Married    Spouse name: N/A  . Number of children: 2  . Years of education: N/A   Occupational History  . Not on file.   Social History Main Topics  . Smoking status: Former Research scientist (life sciences)  . Smokeless tobacco: Never Used  . Alcohol use 0.0 oz/week     Comment: Occasional  . Drug use: No  . Sexual activity: Not on file   Other Topics Concern  . Not on file   Social History Narrative  . No narrative on file    Family History  Problem Relation Age of Onset  . Heart disease      No family history    ROS- All systems are reviewed and negative except as per the HPI above  Physical Exam: Vitals:   04/28/16 0838  BP: 112/80  Pulse: 72  Weight: 244 lb (110.7 kg)  Height: 5\' 11"  (1.803 m)    GEN- The patient is well appearing, alert and oriented x 3 today.   Head- normocephalic, atraumatic Eyes-  Sclera clear, conjunctiva pink Ears- hearing intact Oropharynx- clear Neck- supple, no JVP Lymph- no cervical lymphadenopathy Lungs- Clear to ausculation bilaterally, normal work of breathing Heart- Regular rate and rhythm, no murmurs, rubs or gallops, PMI not laterally displaced GI- soft, NT, ND, + BS Extremities- no clubbing, cyanosis, or edema MS- no significant deformity or atrophy Skin- no rash or lesion Psych- euthymic mood, full affect Neuro- strength and sensation are intact  EKG-SR with first degree AV block, rate 72 bpm, Pr int 222 ms, qrs int 90 ms, qtc 470 ms Epic records reviewed  Assessment and Plan: 1. afib Maintaining SR on Tikosyn Tikosyn precautions reviewed Stressed no interruption in dosing and that he may not be able to establish with a  cardiologist at the [place] before he runs out of Tikosyn. RX given to use in the interium at local drug store if that is the case Continue on eliquis 5 mg bid Bmet/mag/cbc today  2. CHF Weight has stablilized He is watching daily weights and knows how to use lasix if needed for 3-5 wight gain in 24-48 hours Avoiding salt Continue carvedilol Continue spironolactone  F/u 06/08/16 with Dr. Stanford Breed F/u with Dr. Lovena Le 07/17/16 afib clinic as needed  Geroge Baseman. [place], [facility] [address] [place], [place] [address] [phone]

## 2016-04-29 ENCOUNTER — Other Ambulatory Visit (HOSPITAL_COMMUNITY): Payer: Self-pay | Admitting: *Deleted

## 2016-04-29 MED ORDER — POTASSIUM CHLORIDE CRYS ER 10 MEQ PO TBCR: 10.0000 meq | EXTENDED_RELEASE_TABLET | Freq: Every day | ORAL | 6 refills | Status: DC

## 2016-06-01 NOTE — Progress Notes (Signed)
HPI: FU atrial fibrillation/atrial flutter. Also with previous CT showing coronary calcification. First diagnosed with atrial arrhythmias 2/17. Had TEE/DCCV. Had stress test 8/17 at [facility] that showed EF 41 and no ischemia. Pt admitted 11/17 with dyspnea and right pleural effusion. Had thoracentesis and cytology negative; diuresed. Pt also with atypical flutter and placed on tikosyn and converted to sinus. TEE 11/17 showed EF 35-40, biatrial enlargement and moderate TR. Since last seen, he denies dyspnea, chest pain, palpitations, syncope or bleeding.  Current Outpatient Prescriptions  Medication Sig Dispense Refill  . apixaban (ELIQUIS) 5 MG TABS tablet Take 1 tablet (5 mg total) by mouth 2 (two) times daily. 60 tablet 6  . dofetilide (TIKOSYN) 500 MCG capsule Take 1 capsule (500 mcg total) by mouth 2 (two) times daily. 60 capsule 3  . dofetilide (TIKOSYN) 500 MCG capsule Take 1 capsule (500 mcg total) by mouth 2 (two) times daily. 60 capsule 0  . dorzolamide (TRUSOPT) 2 % ophthalmic solution Place 1 drop into both eyes 2 (two) times daily.    . furosemide (LASIX) 40 MG tablet Take 1 tablet (40 mg total) by mouth daily as needed. Take one tablet as needed if you notice weight gain of >3 lbs in 24-48 hours and also notify your cardiologist or primary doctor. 30 tablet 0  . latanoprost (XALATAN) 0.005 % ophthalmic solution Place 1 drop into both eyes at bedtime.    Marland Kitchen losartan (COZAAR) 25 MG tablet Take 12.5 mg by mouth daily.     . Multiple Vitamins-Minerals (MULTIVITAMIN WITH MINERALS) tablet Take 1 tablet by mouth daily.    . potassium chloride (K-DUR,KLOR-CON) 10 MEQ tablet Take 1 tablet (10 mEq total) by mouth daily. 30 tablet 6  . pravastatin (PRAVACHOL) 80 MG tablet Take 40 mg by mouth daily.    Marland Kitchen spironolactone (ALDACTONE) 25 MG tablet Take 0.5 tablets (12.5 mg total) by mouth daily. 30 tablet 3  . carvedilol (COREG) 25 MG tablet Take 1 tablet (25 mg total) by mouth 2 (two) times daily.  180 tablet 3   No current facility-administered medications for this visit.      Past Medical History:  Diagnosis Date  . Atrial fibrillation ([place])   . CAD (coronary artery disease)   . Cardiomyopathy ([place])   . Chronic systolic CHF (congestive heart failure) ([place]) 04/12/2016  . Dysrhythmia Atrial flutter  . Hypertension     Past Surgical History:  Procedure Laterality Date  . CARDIOVERSION N/A 09/04/2015   Procedure: CARDIOVERSION;  Surgeon: Skeet Latch, MD;  Location: [facility];  Service: Cardiovascular;  Laterality: N/A;  . TEE WITHOUT CARDIOVERSION N/A 09/04/2015   Procedure: TRANSESOPHAGEAL ECHOCARDIOGRAM (TEE);  Surgeon: Skeet Latch, MD;  Location: [facility];  Service: Cardiovascular;  Laterality: N/A;  . TEE WITHOUT CARDIOVERSION N/A 04/15/2016   Procedure: TRANSESOPHAGEAL ECHOCARDIOGRAM (TEE);  Surgeon: Jerline Pain, MD;  Location: [facility] ENDOSCOPY;  Service: Cardiovascular;  Laterality: N/A;  . TOE AMPUTATION      Social History   Social History  . Marital status: Married    Spouse name: N/A  . Number of children: 2  . Years of education: N/A   Occupational History  . Not on file.   Social History Main Topics  . Smoking status: Former Research scientist (life sciences)  . Smokeless tobacco: Never Used  . Alcohol use 0.0 oz/week     Comment: Occasional  . Drug use: No  . Sexual activity: Not on file   Other Topics Concern  . Not  on file   Social History Narrative  . No narrative on file    Family History  Problem Relation Age of Onset  . Heart disease      No family history    ROS: no fevers or chills, productive cough, hemoptysis, dysphasia, odynophagia, melena, hematochezia, dysuria, hematuria, rash, seizure activity, orthopnea, PND, pedal edema, claudication. Remaining systems are negative.  Physical Exam: Well-developed well-nourished in no acute distress.  Skin is warm and dry.  HEENT is normal.  Neck is supple. No bruits Chest is clear to auscultation with  normal expansion.  Cardiovascular exam is regular rate and rhythm.  Abdominal exam nontender or distended. No masses palpated. Extremities show no edema. neuro grossly intact  ECG-Sinus rhythm at a rate of 72. Nonspecific ST changes. First-degree AV block.  A/P  1 Atrial fibrillation/atrial flutter-patient remains in sinus rhythm. Continue tikosyn and beta blocker. Continue apixaban.  2 hypertension-blood pressure controlled. Continue present medications.  3 cardiomyopathy-Possibly exacerbated by atrial arrhythmias; continue beta blocker and ACE inhibitor. Nuclear study showed no ischemia. We will plan follow-up echoes in the future.  4 coronary artery disease-based on evidence of calcium in his coronaries on previous CT scan. We will not add aspirin given need for anticoagulation. Continue statin.  Kirk Ruths, MD

## 2016-06-08 ENCOUNTER — Ambulatory Visit (INDEPENDENT_AMBULATORY_CARE_PROVIDER_SITE_OTHER): Payer: Non-veteran care | Admitting: Cardiology

## 2016-06-08 ENCOUNTER — Encounter: Payer: Self-pay | Admitting: Cardiology

## 2016-06-08 VITALS — BP 115/73 | HR 72 | Ht 71.5 in | Wt 249.0 lb

## 2016-06-08 DIAGNOSIS — I10 Essential (primary) hypertension: Secondary | ICD-10-CM | POA: Diagnosis not present

## 2016-06-08 DIAGNOSIS — I4891 Unspecified atrial fibrillation: Secondary | ICD-10-CM | POA: Diagnosis not present

## 2016-06-08 NOTE — Patient Instructions (Addendum)
NO CHANGES AT CURRENT TIME  WILL CANCEL APPOINTMENT WITH DR Lovena Le   Your physician wants you to follow-up in    [place] [facility] [place]  You will receive a reminder letter in the mail two months in advance. If you don't receive a letter, please call our office to schedule the follow-up appointment.   If you need a refill on your cardiac medications before your next appointment, please call your pharmacy.

## 2016-07-17 ENCOUNTER — Encounter: Payer: Medicare Other | Admitting: Internal Medicine

## 2017-10-14 IMAGING — CR DG CHEST 2V
2 series · 2 of 2 positions shown · non-contrast
Comparison: Portable chest x-ray April 13, 2016

CLINICAL DATA: CHF, coronary artery disease, atrial fibrillation,
cardiomyopathy.

EXAM:
CHEST  2 VIEW

[chest pa]
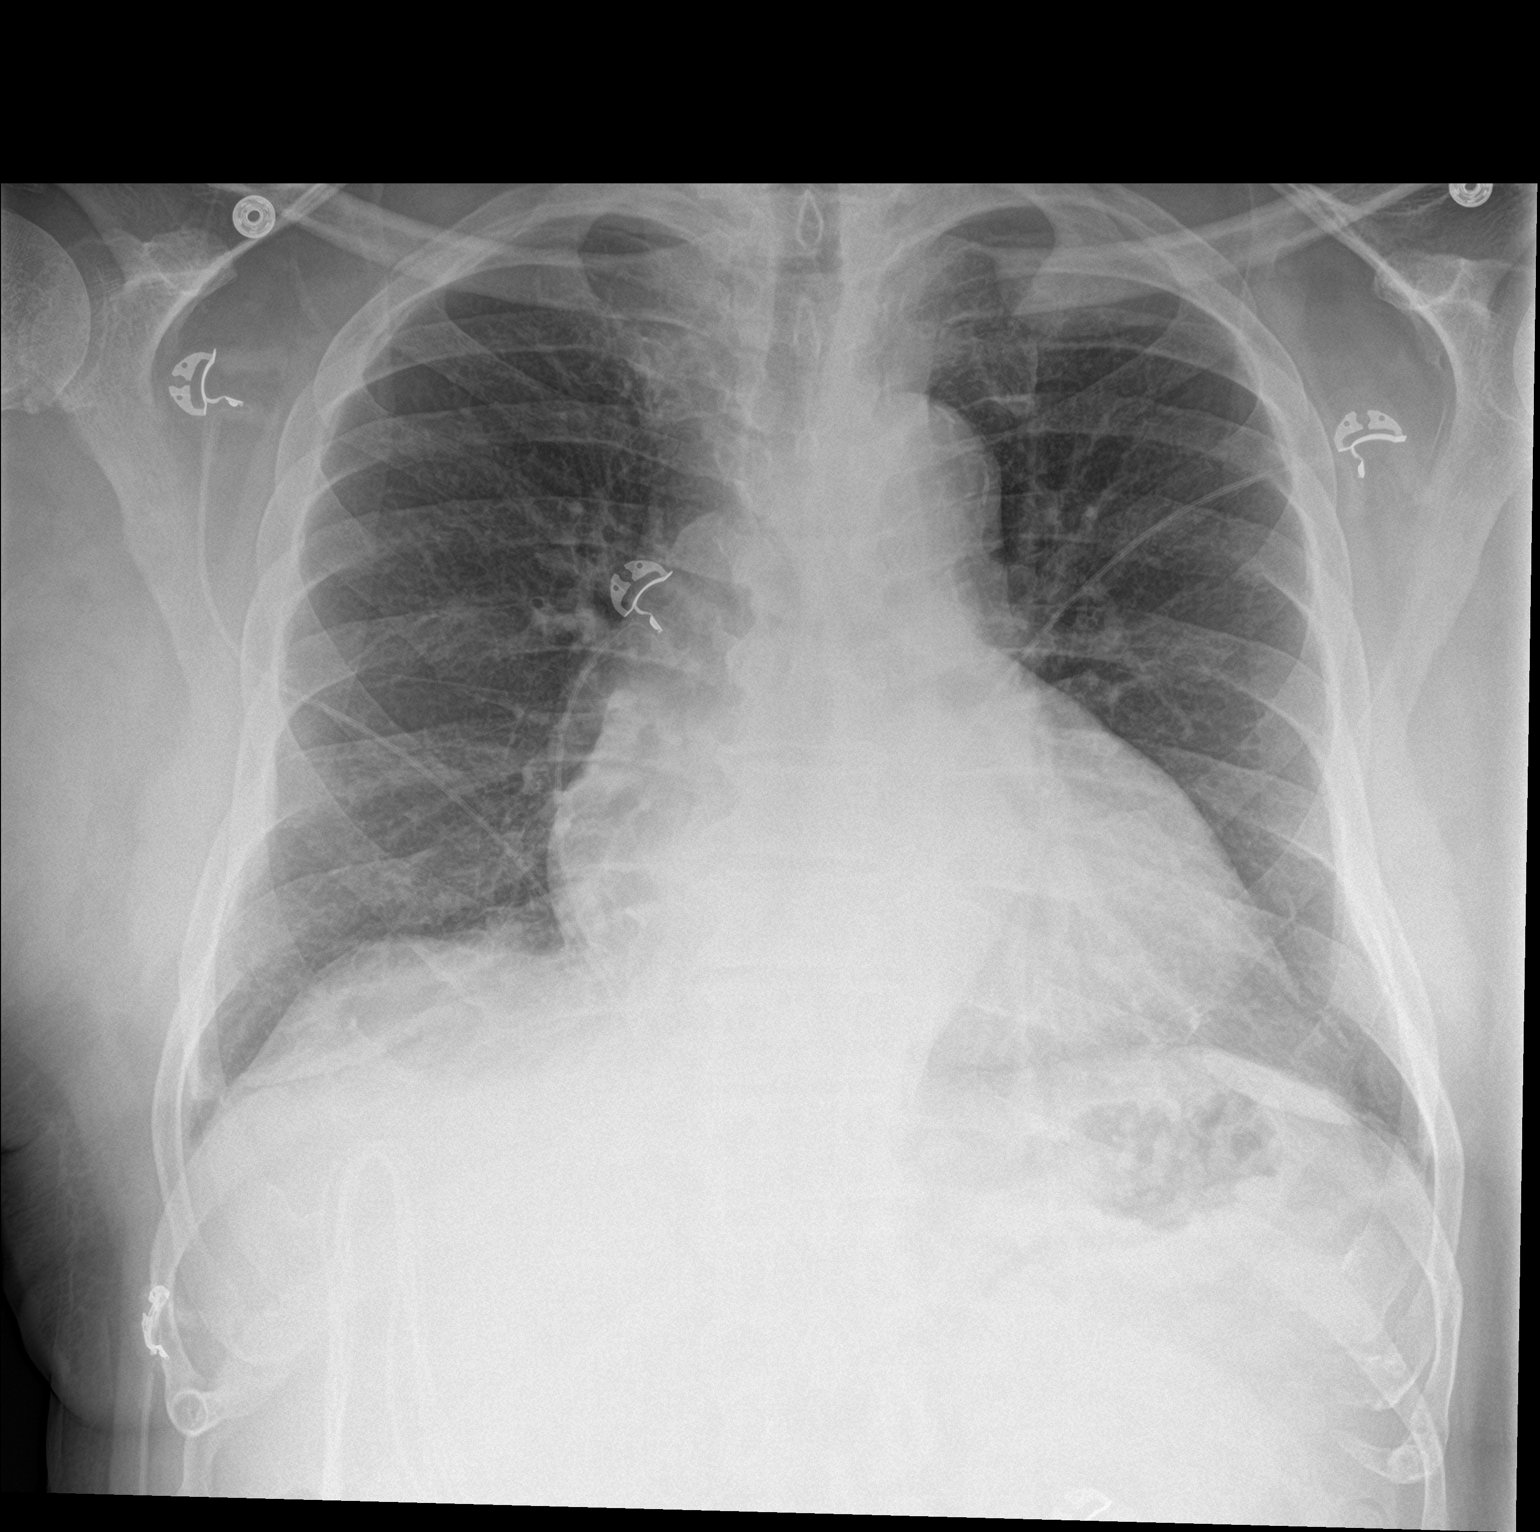

[chest lat]
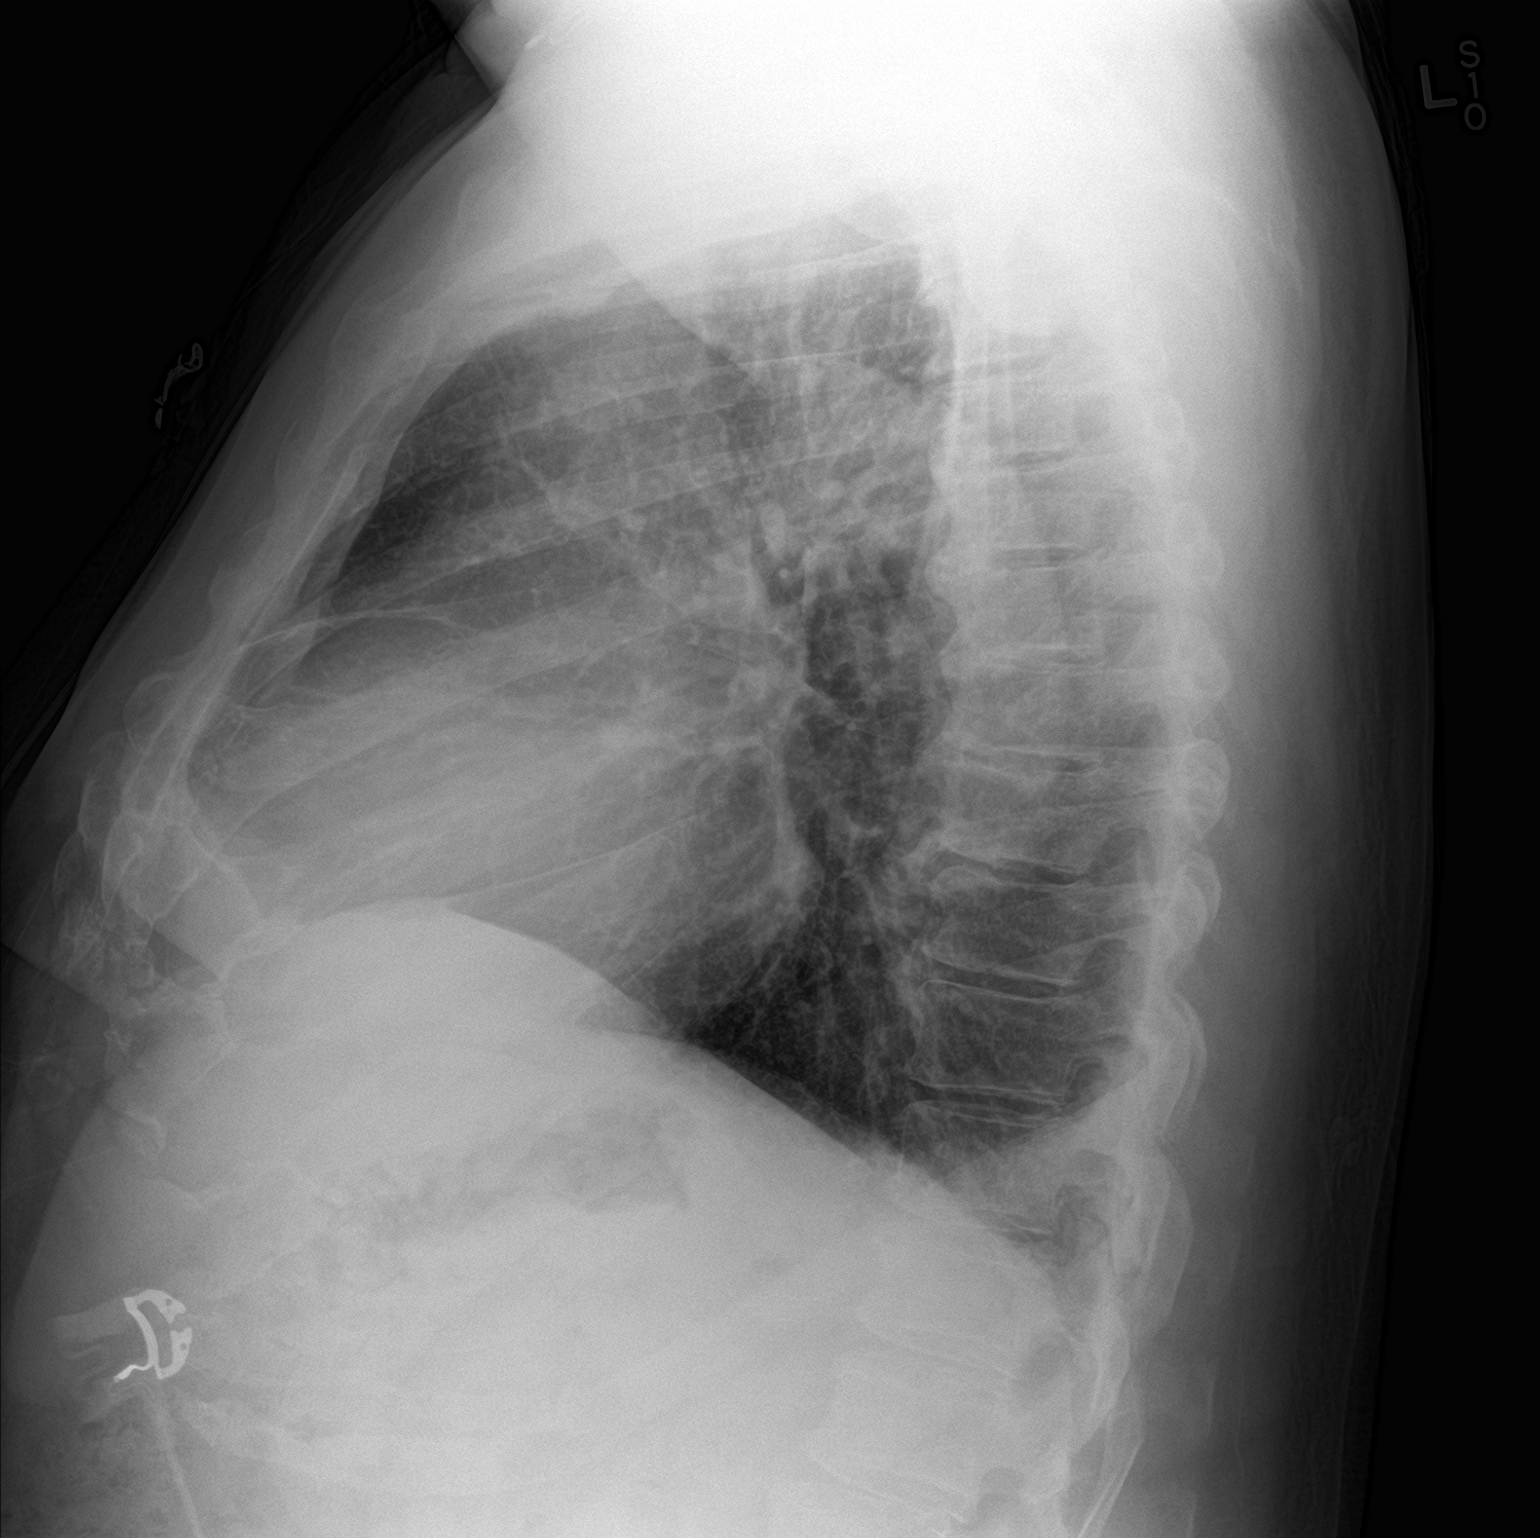

[2 of 2 positions shown; findings below may reference images not displayed]

FINDINGS: The lungs are adequately inflated. There is no focal infiltrate.
There is no pleural effusion or pneumothorax. The cardiac silhouette
is enlarged. The pulmonary vascularity is normal. There is
calcification in the thoracic aortic arch. The observed bony thorax
exhibits no acute abnormality.
IMPRESSION: No reaccumulation of pleural fluid on the right. No right-sided
pneumothorax. Stable cardiomegaly without pulmonary edema.

Thoracic aortic atherosclerosis.

## 2017-11-11 ENCOUNTER — Telehealth: Payer: Self-pay | Admitting: Cardiology

## 2017-11-11 NOTE — Telephone Encounter (Signed)
With verbal permission from pt, spoke to wife who states she has noticed an increase in pts weight. She reports it hasnt been 3 lbs overnight or 5 lbs in week but just now he has gained. She also report pt has become SOB with exertion since Sun and wanted him to be seen. Appointment made for 6/19 at 2 pm with Jory Sims, DNP.

## 2017-11-11 NOTE — Telephone Encounter (Signed)
Pt c/o Shortness Of Breath: STAT if SOB developed within the last 24 hours or pt is noticeably SOB on the phone  1. Are you currently SOB (can you hear that pt is SOB on the phone)? Not at moment  2. How long have you been experiencing SOB? 4 days  3. Are you SOB when sitting or when up moving around? Moving around  4. Are you currently experiencing any other symptoms? No  Wife stated she has noticed it especially when he go from room to room. She also stated the pt has gained weight in his mid section.

## 2017-11-11 NOTE — Telephone Encounter (Signed)
Spoke with pt's wife and informed that  I would not be able to release any information without verbal approval from pt. Verbalized understanding and stated she will have husband call back to give permission.

## 2017-11-17 ENCOUNTER — Ambulatory Visit: Payer: Non-veteran care | Admitting: Adult Health

## 2018-03-08 ENCOUNTER — Emergency Department (HOSPITAL_COMMUNITY): Payer: No Typology Code available for payment source

## 2018-03-08 ENCOUNTER — Inpatient Hospital Stay (HOSPITAL_COMMUNITY)
Admission: EM | Admit: 2018-03-08 | Discharge: 2018-03-10 | DRG: 292 | Disposition: A | Discharge status: Home / Self Care | Payer: No Typology Code available for payment source | Attending: Family Medicine | Admitting: Family Medicine

## 2018-03-08 ENCOUNTER — Encounter (HOSPITAL_COMMUNITY): Payer: Self-pay

## 2018-03-08 ENCOUNTER — Other Ambulatory Visit: Payer: Self-pay

## 2018-03-08 DIAGNOSIS — Z87891 Personal history of nicotine dependence: Secondary | ICD-10-CM

## 2018-03-08 DIAGNOSIS — Z888 Allergy status to other drugs, medicaments and biological substances status: Secondary | ICD-10-CM

## 2018-03-08 DIAGNOSIS — I483 Typical atrial flutter: Secondary | ICD-10-CM | POA: Diagnosis not present

## 2018-03-08 DIAGNOSIS — Z79899 Other long term (current) drug therapy: Secondary | ICD-10-CM | POA: Diagnosis not present

## 2018-03-08 DIAGNOSIS — I484 Atypical atrial flutter: Secondary | ICD-10-CM | POA: Diagnosis present

## 2018-03-08 DIAGNOSIS — I5023 Acute on chronic systolic (congestive) heart failure: Secondary | ICD-10-CM | POA: Diagnosis not present

## 2018-03-08 DIAGNOSIS — J90 Pleural effusion, not elsewhere classified: Secondary | ICD-10-CM

## 2018-03-08 DIAGNOSIS — I248 Other forms of acute ischemic heart disease: Secondary | ICD-10-CM | POA: Diagnosis not present

## 2018-03-08 DIAGNOSIS — I700 Atherosclerosis of aorta: Secondary | ICD-10-CM | POA: Diagnosis present

## 2018-03-08 DIAGNOSIS — Z6835 Body mass index (BMI) 35.0-35.9, adult: Secondary | ICD-10-CM | POA: Diagnosis not present

## 2018-03-08 DIAGNOSIS — I5030 Unspecified diastolic (congestive) heart failure: Secondary | ICD-10-CM | POA: Diagnosis not present

## 2018-03-08 DIAGNOSIS — R0602 Shortness of breath: Secondary | ICD-10-CM | POA: Diagnosis present

## 2018-03-08 DIAGNOSIS — E876 Hypokalemia: Secondary | ICD-10-CM | POA: Diagnosis not present

## 2018-03-08 DIAGNOSIS — I2510 Atherosclerotic heart disease of native coronary artery without angina pectoris: Secondary | ICD-10-CM | POA: Diagnosis present

## 2018-03-08 DIAGNOSIS — Z7901 Long term (current) use of anticoagulants: Secondary | ICD-10-CM

## 2018-03-08 DIAGNOSIS — R748 Abnormal levels of other serum enzymes: Secondary | ICD-10-CM | POA: Diagnosis not present

## 2018-03-08 DIAGNOSIS — I440 Atrioventricular block, first degree: Secondary | ICD-10-CM | POA: Diagnosis present

## 2018-03-08 DIAGNOSIS — J9811 Atelectasis: Secondary | ICD-10-CM | POA: Diagnosis present

## 2018-03-08 DIAGNOSIS — I429 Cardiomyopathy, unspecified: Secondary | ICD-10-CM | POA: Diagnosis present

## 2018-03-08 DIAGNOSIS — N179 Acute kidney failure, unspecified: Secondary | ICD-10-CM

## 2018-03-08 DIAGNOSIS — R7989 Other specified abnormal findings of blood chemistry: Secondary | ICD-10-CM

## 2018-03-08 DIAGNOSIS — I110 Hypertensive heart disease with heart failure: Principal | ICD-10-CM | POA: Diagnosis present

## 2018-03-08 DIAGNOSIS — I5043 Acute on chronic combined systolic (congestive) and diastolic (congestive) heart failure: Secondary | ICD-10-CM | POA: Diagnosis not present

## 2018-03-08 DIAGNOSIS — D171 Benign lipomatous neoplasm of skin and subcutaneous tissue of trunk: Secondary | ICD-10-CM | POA: Diagnosis present

## 2018-03-08 DIAGNOSIS — R778 Other specified abnormalities of plasma proteins: Secondary | ICD-10-CM

## 2018-03-08 DIAGNOSIS — I4891 Unspecified atrial fibrillation: Secondary | ICD-10-CM | POA: Diagnosis not present

## 2018-03-08 DIAGNOSIS — E669 Obesity, unspecified: Secondary | ICD-10-CM | POA: Diagnosis present

## 2018-03-08 DIAGNOSIS — I482 Chronic atrial fibrillation, unspecified: Secondary | ICD-10-CM | POA: Diagnosis present

## 2018-03-08 HISTORY — DX: Dyspnea, unspecified: R06.00

## 2018-03-08 LAB — BASIC METABOLIC PANEL
ANION GAP: 13 (ref 5–15)
BUN: 19 mg/dL (ref 8–23)
CO2: 22 mmol/L (ref 22–32)
Calcium: 9.6 mg/dL (ref 8.9–10.3)
Chloride: 100 mmol/L (ref 98–111)
Creatinine, Ser: 1.37 mg/dL — ABNORMAL HIGH (ref 0.61–1.24)
GFR calc Af Amer: 56 mL/min — ABNORMAL LOW (ref >60)
GFR, EST NON AFRICAN AMERICAN: 48 mL/min — AB (ref >60)
GLUCOSE: 122 mg/dL — AB (ref 70–99)
POTASSIUM: 4.7 mmol/L (ref 3.5–5.1)
Sodium: 135 mmol/L (ref 135–145)

## 2018-03-08 LAB — CBC
HCT: 46.1 % (ref 39.0–52.0)
HEMOGLOBIN: 14.7 g/dL (ref 13.0–17.0)
MCH: 27.9 pg (ref 26.0–34.0)
MCHC: 31.9 g/dL (ref 30.0–36.0)
MCV: 87.5 fL (ref 80.0–100.0)
NRBC: 0 % (ref 0.0–0.2)
Platelets: 199 10*3/uL (ref 150–400)
RBC: 5.27 MIL/uL (ref 4.22–5.81)
RDW: 13.4 % (ref 11.5–15.5)
WBC: 10.1 10*3/uL (ref 4.0–10.5)

## 2018-03-08 LAB — HEPATIC FUNCTION PANEL
ALBUMIN: 4.1 g/dL (ref 3.5–5.0)
ALT: 17 U/L (ref 0–44)
AST: 24 U/L (ref 15–41)
Alkaline Phosphatase: 75 U/L (ref 38–126)
Bilirubin, Direct: 0.5 mg/dL — ABNORMAL HIGH (ref 0.0–0.2)
Indirect Bilirubin: 2.5 mg/dL — ABNORMAL HIGH (ref 0.3–0.9)
TOTAL PROTEIN: 7.3 g/dL (ref 6.5–8.1)
Total Bilirubin: 3 mg/dL — ABNORMAL HIGH (ref 0.3–1.2)

## 2018-03-08 LAB — TROPONIN I
TROPONIN I: 0.11 ng/mL — AB (ref <0.03)
Troponin I: 0.1 ng/mL (ref <0.03)
Troponin I: 0.11 ng/mL (ref <0.03)

## 2018-03-08 LAB — I-STAT TROPONIN, ED: TROPONIN I, POC: 0.07 ng/mL (ref 0.00–0.08)

## 2018-03-08 LAB — BRAIN NATRIURETIC PEPTIDE: B NATRIURETIC PEPTIDE 5: 716.6 pg/mL — AB (ref 0.0–100.0)

## 2018-03-08 MED ORDER — LATANOPROST 0.005 % OP SOLN
1.0000 [drp] | Freq: Every day | OPHTHALMIC | Status: DC
  Administered 2018-03-08 – 2018-03-09 (×2): 1 [drp] via OPHTHALMIC
  Filled 2018-03-08: qty 2.5

## 2018-03-08 MED ORDER — DORZOLAMIDE HCL 2 % OP SOLN
1.0000 [drp] | Freq: Two times a day (BID) | OPHTHALMIC | Status: DC
  Administered 2018-03-08 – 2018-03-10 (×4): 1 [drp] via OPHTHALMIC
  Filled 2018-03-08: qty 10

## 2018-03-08 MED ORDER — FUROSEMIDE 10 MG/ML IJ SOLN
40.0000 mg | Freq: Once | INTRAMUSCULAR | Status: AC
  Administered 2018-03-08: 40 mg via INTRAVENOUS
  Filled 2018-03-08: qty 4

## 2018-03-08 MED ORDER — DOFETILIDE 500 MCG PO CAPS
500.0000 ug | ORAL_CAPSULE | Freq: Two times a day (BID) | ORAL | Status: DC
  Administered 2018-03-08 – 2018-03-10 (×5): 500 ug via ORAL
  Filled 2018-03-08 (×5): qty 1

## 2018-03-08 MED ORDER — ACETAMINOPHEN 325 MG PO TABS: 650.0000 mg | ORAL_TABLET | Freq: Four times a day (QID) | ORAL | Status: DC | PRN

## 2018-03-08 MED ORDER — CARVEDILOL 25 MG PO TABS
25.0000 mg | ORAL_TABLET | Freq: Two times a day (BID) | ORAL | Status: DC
  Administered 2018-03-08 – 2018-03-10 (×3): 25 mg via ORAL
  Filled 2018-03-08 (×4): qty 1

## 2018-03-08 MED ORDER — IOPAMIDOL (ISOVUE-370) INJECTION 76%
100.0000 mL | Freq: Once | INTRAVENOUS | Status: AC | PRN
  Administered 2018-03-08: 80 mL via INTRAVENOUS

## 2018-03-08 MED ORDER — POLYETHYLENE GLYCOL 3350 17 G PO PACK
17.0000 g | PACK | Freq: Every day | ORAL | Status: DC | PRN
  Administered 2018-03-09: 17 g via ORAL
  Filled 2018-03-08: qty 1

## 2018-03-08 MED ORDER — LOSARTAN POTASSIUM 25 MG PO TABS
12.5000 mg | ORAL_TABLET | Freq: Every day | ORAL | Status: DC
  Administered 2018-03-09: 12.5 mg via ORAL
  Filled 2018-03-08 (×2): qty 1

## 2018-03-08 MED ORDER — APIXABAN 5 MG PO TABS
5.0000 mg | ORAL_TABLET | Freq: Two times a day (BID) | ORAL | Status: DC
  Administered 2018-03-08 – 2018-03-10 (×5): 5 mg via ORAL
  Filled 2018-03-08 (×5): qty 1

## 2018-03-08 MED ORDER — SPIRONOLACTONE 12.5 MG HALF TABLET
12.5000 mg | ORAL_TABLET | Freq: Every day | ORAL | Status: DC
  Administered 2018-03-09 – 2018-03-10 (×2): 12.5 mg via ORAL
  Filled 2018-03-08 (×2): qty 1

## 2018-03-08 MED ORDER — IOPAMIDOL (ISOVUE-370) INJECTION 76%
INTRAVENOUS | Status: AC
  Filled 2018-03-08: qty 100

## 2018-03-08 MED ORDER — ACETAMINOPHEN 650 MG RE SUPP: 650.0000 mg | Freq: Four times a day (QID) | RECTAL | Status: DC | PRN

## 2018-03-08 MED ORDER — GUAIFENESIN-DM 100-10 MG/5ML PO SYRP
5.0000 mL | ORAL_SOLUTION | ORAL | Status: DC | PRN
  Administered 2018-03-08 – 2018-03-09 (×2): 5 mL via ORAL
  Filled 2018-03-08 (×2): qty 5

## 2018-03-08 NOTE — ED Provider Notes (Signed)
Of shortness of breath progressively worsening over the past several weeks.  He is now the point where he becomes short of breath with minimal exertion and walking across the room.  Other associated symptoms include cough, nonproductive.  He denies any chest pain.  No other associated symptoms. chestx-ray viewed by me   Orlie Dakin, MD 03/08/18 1446

## 2018-03-08 NOTE — ED Triage Notes (Signed)
Pt was seen at [facility] for regular check up today, found O2 to be low and did XR which showed plural effusion.

## 2018-03-08 NOTE — H&P (Signed)
[facility] Admission History and Physical Service Pager: [phone]  Patient name: Christopher Benton Medical record number: [id number] Date of birth: [birth year] Age: 77 y.o. Gender: male  Primary Care Provider: System, Pcp Not In Consultants: Cardiology Code Status: Full  Chief Complaint: Shortness of breath  Assessment and Plan: Christopher Benton is a 77 y.o. male presenting with a 6 month history of progressive shortness of breath. 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  Shortness of Breath with Large Right-sided Pleural Effusion: likely HFrEF exacerbation. Pt reports a 6 month history of progressive SOB that has worsened over the past few weeks. Chest xray and CTA with evidence of a large right sided pleural effusion, no evidence of PE or lung nodules/opacities. O2 sats 93-96% on room air, BNP 716 (previously 373 ~2 years ago).  Physical exam is significant for bibasilar diminished breath sounds R>L with end expiratory wheezes on the right, JVD, 2+ pitting edema bilateral to knees. Differential includes infection, malignancy, CHF exacerbation, and PE. Pt reports exertional dyspnea and hx of HFrEF, elevated BNP, and hypervolemic on exam making CHF exacerbation most likely though the unilateral pleural effusion would be atypical. Pt remains afebrile, no leukocytosis, and imaging without opacities; little concern for PNA though unilateral pleural effusion could be of infectious cause and ROS is + for productive cough. However, previous admission in 2017 with nearly identical clinical picture with evidence of transudative unilateral right pleural effusion. Pt denies any B symptoms so less concern for malignancy. PE ruled out with CTA plus chronic nature of SOB makes this less likely.  -admit to inpatient, attending Dr. Gwendlyn Deutscher -Will hold on abx at this time given clinical picture -IV 40mg  Lasix x1, monitor  UOP for redosing.  -monitor on telemetry -echo -Strict I/Os and daily weights -Repeat BMP pending -Repeat AM chest xray; if no radiographic improvement from diuresis will consider diagnostic and therapeutic thoracentesis.  Troponinemia: Serum troponin on admission 0.10; pt denies chest pain, n/v/d. EKG without ischemic changes. Likely demand ischemia in the setting of HFrEF and possible exacerbation. Pt with known CAD per EMR. Other risk factors including Afib and obesity.  -Will trend troponins x3 -Repeat am EKG -CTM clinical symptoms of ischemia -Cards consulted, appreciate recs   HFrEF: Pt receives primary care at the [facility]; the most recent echo with evidence of LVEF 35-40 percent by TEE 11/15. Reports compliance with home medications of Lasix (20mg  BID), Coreg 25 BID, Losartan 12.5mg  daily, and sprinolactone 12.5mg  daily. His HFrEF could certainly be contributing to his SOB and will likely be the etiology if the initial workup is negative.  -Will consider a repeat echo given chronic nature of SOB -40 mg IV Lasix as per above -Continue home meds Coreg 25 BID, Losartan 12.5mg  daily, and sprinolactone 12.5mg  daily.  Hyperbilirubinemia: Tbili on admission 3.0 with direct bili 0.5. Physical exam without evidence of jaundice or hepatomegaly. No abdominal distention and LFTs WNLs. Could be incidental benign finding vs underlying liver pathology that could be contributing to right pleural effusion though PE and LFTs make this less likely. -Will repeat am bili -Will CTM for now  Afib, chronic and stable: Pt denies current symptoms of afib and is on Tikosyn and Eliquis at home. Reports compliance with this medication. EKG on admission without evidence of arrhthymias. -Continue home dose of 532mcg Tikosyn BID and Eliquis 25mg  daily -Will consult cards for their recs  FEN/GI: heart healthy Prophylaxis: Eliquis  Disposition: pending clinical improvement, likely home  History of Present Illness:   Christopher Benton is a 77 y.o. male presenting with a 6 month history of progressive shortness of breath. 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  Patient reports a 6 month hx of progressive SOB that has worsened over the past few weeks. Says used to be able to walk 5 miles but now can only walk a quarter of a mile. Reports SOB is exertional but denies any chest pain. He reports medication comliance but does report poor diet with a lot of salt. Pt denies any recent cold symptoms including fever or chills. But he does report a productive cough for over a month (previous tobacco history, quit 30 years ago. Reports he used Claritin some without any relief. Reports his symptoms are very similar to his previous admission in 2017. He reports he took his Tikosyn, Losartan, and spironolactone today.   Pt lives at home and is able to participate in his IADLs incuding dressing, bathing, and eating  Himself independently. He is employed as a guard.   Review Of Systems: Per HPI with the following additions:  Review of Systems  Constitutional: Positive for malaise/fatigue. Negative for chills, fever and weight loss.  Respiratory: Positive for cough, sputum production and shortness of breath. Negative for wheezing.   Cardiovascular: Positive for leg swelling. Negative for chest pain and palpitations.  Gastrointestinal: Negative for abdominal pain, constipation, diarrhea, nausea and vomiting.  Genitourinary: Negative for dysuria, frequency and urgency.  Neurological: Negative for dizziness and headaches.    Patient Active Problem List   Diagnosis Date Noted  . Elevated troponin I level   . Pleural effusion on right 04/12/2016  . Chronic systolic CHF (congestive heart failure) ([place]) 04/12/2016  . 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 CAD (coronary artery disease) 11/27/2015  . Atrial fibrillation ([place]) 09/25/2015  .  Cardiomyopathy ([place]) 09/25/2015  . Typical atrial flutter ([place])   . Atypical atrial flutter ([place]) 08/28/2015  . Essential hypertension 08/28/2015    Past Medical History: Past Medical History:  Diagnosis Date  . Atrial fibrillation ([place])   . CAD (coronary artery disease)   . Cardiomyopathy ([place])   . Chronic systolic CHF (congestive heart failure) ([place]) 04/12/2016  . Dysrhythmia Atrial flutter  . Hypertension     Past Surgical History: Past Surgical History:  Procedure Laterality Date  . CARDIOVERSION N/A 09/04/2015   Procedure: CARDIOVERSION;  Surgeon: Skeet Latch, MD;  Location: [place];  Service: Cardiovascular;  Laterality: N/A;  . TEE WITHOUT CARDIOVERSION N/A 09/04/2015   Procedure: TRANSESOPHAGEAL ECHOCARDIOGRAM (TEE);  Surgeon: Skeet Latch, MD;  Location: [place];  Service: Cardiovascular;  Laterality: N/A;  . TEE WITHOUT CARDIOVERSION N/A 04/15/2016   Procedure: TRANSESOPHAGEAL ECHOCARDIOGRAM (TEE);  Surgeon: Jerline Pain, MD;  Location: [facility] ENDOSCOPY;  Service: Cardiovascular;  Laterality: N/A;  . TOE AMPUTATION      Social History: Social History   Tobacco Use  . Smoking status: Former Research scientist (life sciences)  . Smokeless tobacco: Never Used  Substance Use Topics  . Alcohol use: Yes    Alcohol/week: 0.0 standard drinks    Comment: Occasional  . Drug use: No   Additional social history: employed as guard. He stopped 30 years ago, 1 pack a week for 5 years. He drinks 1 alcoholic beverage/month. Denies drug use.   Please also refer to relevant sections of EMR.  Family History: Family History  Problem Relation  Age of Onset  . Heart disease Unknown        No family history  No pertinent family  History.    Allergies and Medications: Allergies  Allergen Reactions  . Pradaxa [Dabigatran Etexilate Mesylate] Nausea Only   No current facility-administered medications on file prior to encounter.    Current Outpatient Medications on File Prior to Encounter   Medication Sig Dispense Refill  . apixaban (ELIQUIS) 5 MG TABS tablet Take 1 tablet (5 mg total) by mouth 2 (two) times daily. 60 tablet 6  . carvedilol (COREG) 25 MG tablet Take 1 tablet (25 mg total) by mouth 2 (two) times daily. 180 tablet 3  . dofetilide (TIKOSYN) 500 MCG capsule Take 1 capsule (500 mcg total) by mouth 2 (two) times daily. 60 capsule 3  . dorzolamide (TRUSOPT) 2 % ophthalmic solution Place 1 drop into both eyes 2 (two) times daily.    . furosemide (LASIX) 40 MG tablet Take 1 tablet (40 mg total) by mouth daily as needed. Take one tablet as needed if you notice weight gain of >3 lbs in 24-48 hours and also notify your cardiologist or primary doctor. (Patient taking differently: Take 40 mg by mouth daily as needed for fluid (if you notice weight gain of >3 lbs in 24-48 hours and also notify your cardiologist or primary doctor.). ) 30 tablet 0  . latanoprost (XALATAN) 0.005 % ophthalmic solution Place 1 drop into both eyes at bedtime.    Marland Kitchen losartan (COZAAR) 25 MG tablet Take 12.5 mg by mouth daily.     . Multiple Vitamins-Minerals (MULTIVITAMIN WITH MINERALS) tablet Take 1 tablet by mouth daily.    . potassium chloride (K-DUR,KLOR-CON) 10 MEQ tablet Take 1 tablet (10 mEq total) by mouth daily. 30 tablet 6  . spironolactone (ALDACTONE) 25 MG tablet Take 0.5 tablets (12.5 mg total) by mouth daily. 30 tablet 3  . dofetilide (TIKOSYN) 500 MCG capsule Take 1 capsule (500 mcg total) by mouth 2 (two) times daily. (Patient not taking: Reported on 03/08/2018) 60 capsule 0    Objective: BP 104/80   Pulse (!) 56   Temp 98.7 F (37.1 C)   Resp (!) 22   Ht 5\' 11"  (1.803 m)   Wt 116.6 kg   SpO2 93%   BMI 35.84 kg/m  Exam: General: Well appearing man sitting up in hospital bed. In NAD Eyes: EOMI, PERRLA, no scleral icterus ENTM: Moist mucous membranes, no oral lesions, fair dentition.  Neck: Supple, JVD to 2 cm above clavicle. Cardiovascular: Distant heart sounds, normal S1/S2, no  murmur, rubs, or gallops.  Respiratory: Diminished breath sounds on R to mid right lung field. End expiratory wheezes in bilateral lower bases. Normal work of breathing on room air. No rhonchi Gastrointestinal: Soft, nondistended, nontender to palpation, active bowel sounds in all 4 quadrants, no hepatomegaly MSK: Able to move all 4 extremities. 2+ pitting edema to knees bilaterally. Derm: No jaundice, no rashes Neuro: Gross cranial nerves intact, able to respond to commands, alert and oriented Psych: Appropriate mood and affect  Labs and Imaging: CBC BMET  Recent Labs  Lab 03/08/18 1156  WBC 10.1  HGB 14.7  HCT 46.1  PLT 199   Recent Labs  Lab 03/08/18 1156  NA 135  K 4.7  CL 100  CO2 22  BUN 19  CREATININE 1.37*  GLUCOSE 122*  CALCIUM 9.6     Troponin #1: 0.10  BNP: 716 (373 2 years ago) TBili 3.0, Direct Bili 0.5  AST/ALT: 24/17  CTA 10/8:  IMPRESSION: Large right pleural effusion is noted with complete atelectasis of the right lower lobe and mild subsegmental atelectasis of the right upper and middle lobes. 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 Aortic Atherosclerosis (ICD10-I70.0).  Chest Xray 10/8:  IMPRESSION: Moderate to large right pleural effusion. The density in the superior segment of the right lower lobe may represent airspace disease or potentially pulmonary nodule. Enlarged cardiac silhouette. Calcific atherosclerotic disease and tortuosity of the aorta.  Admission EKG: No ST elevations, sinus rhythm with 1st degree AV block. QTc 526.   Terance Hart, April A, Medical Student 03/08/2018, 3:40 PM [place] [facility] pager: [phone], text pages welcome  FPTS Upper-Level Resident Addendum  I have independently interviewed and examined the patient. I have discussed the above with the original author and agree with their documentation. My edits for correction/addition/clarification  are in blue. Please see also any attending notes.   Bufford Lope, DO PGY-3, [facility] 03/08/2018 5:43 PM  [place] Service pager: [phone] (text pages welcome through [place])

## 2018-03-08 NOTE — Progress Notes (Signed)
Troponin increased from 0.1 to 0.11.  Cardiology notified and aware.  [place] Constable, D.O.  PGY-1 Family Medicine  03/08/2018 9:16 PM

## 2018-03-08 NOTE — ED Provider Notes (Signed)
[place] EMERGENCY DEPARTMENT Provider Note   CSN: [id number] Arrival date & time: 03/08/18  1112     History   Chief Complaint Chief Complaint  Patient presents with  . Shortness of Breath    HPI Christopher Benton is a 77 y.o. male.  HPI   Christopher Benton is a 77 y.o. male, with a history of CHF, Afib, cardiomyopathy, HTN, presenting to the ED with shortness of breath worsening over about the last 6 months. Especially apparent with exertion. Accompanied by fatigue, increased peripheral edema, and cough.  He was seen at his PCP with [facility] today, had CXR, found pleural effusion.  Had a similar onset of symptoms Nov 2017, had large right pleural effusion, subsequent thoracentesis, and was placed on Tikosyn, which he still takes.  Denies fever/chills, chest pain, orthopnea, abdominal pain, N/V, diaphoresis, syncope, or any other complaints.   Cardiologist[facility]  Past Medical History:  Diagnosis Date  . Atrial fibrillation ([place])   . CAD (coronary artery disease)   . Cardiomyopathy ([place])   . Chronic systolic CHF (congestive heart failure) ([place]) 04/12/2016  . Dysrhythmia Atrial flutter  . Hypertension     Patient Active Problem List   Diagnosis Date Noted  . Shortness of breath 03/08/2018  . Elevated troponin I level   . Pleural effusion on right 04/12/2016  . Chronic systolic CHF (congestive heart failure) ([place]) 04/12/2016  . 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 CAD (coronary artery disease) 11/27/2015  . Atrial fibrillation ([place]) 09/25/2015  . Cardiomyopathy ([place]) 09/25/2015  . Typical atrial flutter ([place])   . Atypical atrial flutter ([place]) 08/28/2015  . Essential hypertension 08/28/2015    Past Surgical History:  Procedure Laterality Date  . CARDIOVERSION N/A 09/04/2015   Procedure: CARDIOVERSION;  Surgeon: Skeet Latch, MD;  Location: [place];  Service: Cardiovascular;  Laterality: N/A;  . TEE  WITHOUT CARDIOVERSION N/A 09/04/2015   Procedure: TRANSESOPHAGEAL ECHOCARDIOGRAM (TEE);  Surgeon: Skeet Latch, MD;  Location: [place];  Service: Cardiovascular;  Laterality: N/A;  . TEE WITHOUT CARDIOVERSION N/A 04/15/2016   Procedure: TRANSESOPHAGEAL ECHOCARDIOGRAM (TEE);  Surgeon: Jerline Pain, MD;  Location: [facility] ENDOSCOPY;  Service: Cardiovascular;  Laterality: N/A;  . TOE AMPUTATION          Home Medications    Prior to Admission medications   Medication Sig Start Date End Date Taking? Authorizing Provider  apixaban (ELIQUIS) 5 MG TABS tablet Take 1 tablet (5 mg total) by mouth 2 (two) times daily. 08/28/15  Yes Lelon Perla, MD  carvedilol (COREG) 25 MG tablet Take 1 tablet (25 mg total) by mouth 2 (two) times daily. 02/18/16 03/08/18 Yes Lelon Perla, MD  dofetilide (TIKOSYN) 500 MCG capsule Take 1 capsule (500 mcg total) by mouth 2 (two) times daily. 04/28/16  Yes Sherran Needs, NP  dorzolamide (TRUSOPT) 2 % ophthalmic solution Place 1 drop into both eyes 2 (two) times daily.   Yes [provider]  furosemide (LASIX) 40 MG tablet Take 1 tablet (40 mg total) by mouth daily as needed. Take one tablet as needed if you notice weight gain of >3 lbs in 24-48 hours and also notify your cardiologist or primary doctor. Patient taking differently: Take 40 mg by mouth daily as needed for fluid (if you notice weight gain of >3 lbs in 24-48 hours and also notify your cardiologist or primary doctor.).  04/18/16  Yes Theodis Blaze, MD  latanoprost (XALATAN) 0.005 %  ophthalmic solution Place 1 drop into both eyes at bedtime.   Yes [provider]  losartan (COZAAR) 25 MG tablet Take 12.5 mg by mouth daily.    Yes [provider]  Multiple Vitamins-Minerals (MULTIVITAMIN WITH MINERALS) tablet Take 1 tablet by mouth daily.   Yes [provider]  potassium chloride (K-DUR,KLOR-CON) 10 MEQ tablet Take 1 tablet (10 mEq total) by mouth daily. 04/29/16   Yes Sherran Needs, NP  spironolactone (ALDACTONE) 25 MG tablet Take 0.5 tablets (12.5 mg total) by mouth daily. 04/28/16  Yes Sherran Needs, NP  dofetilide (TIKOSYN) 500 MCG capsule Take 1 capsule (500 mcg total) by mouth 2 (two) times daily. Patient not taking: Reported on 03/08/2018 04/28/16   Sherran Needs, NP    Family History Family History  Problem Relation Age of Onset  . Heart disease Unknown        No family history    Social History Social History   Tobacco Use  . Smoking status: Former Research scientist (life sciences)  . Smokeless tobacco: Never Used  Substance Use Topics  . Alcohol use: Yes    Alcohol/week: 0.0 standard drinks    Comment: Occasional  . Drug use: No     Allergies   Pradaxa [dabigatran etexilate mesylate]   Review of Systems Review of Systems  Constitutional: Negative for chills, diaphoresis and fever.  Respiratory: Positive for cough and shortness of breath.   Cardiovascular: Positive for leg swelling. Negative for chest pain.  Gastrointestinal: Negative for abdominal pain, diarrhea, nausea and vomiting.  Neurological: Negative for syncope.  All other systems reviewed and are negative.    Physical Exam Updated Vital Signs BP 111/85   Pulse (!) 59   Temp 98.7 F (37.1 C)   Resp 18   Ht 5\' 11"  (1.803 m)   Wt 116.6 kg   SpO2 95%   BMI 35.84 kg/m   Physical Exam  Constitutional: He appears well-developed and well-nourished. No distress.  HENT:  Head: Normocephalic and atraumatic.  Eyes: Conjunctivae are normal.  Neck: Neck supple.  Cardiovascular: Normal rate, regular rhythm, normal heart sounds and intact distal pulses.  Pulmonary/Chest: He has decreased breath sounds in the right middle field and the right lower field.  Conversational dyspnea.  Abdominal: Soft. There is no tenderness. There is no guarding.  Musculoskeletal: He exhibits edema. He exhibits no tenderness.  Bilateral lower extremity pitting edema.  No tenderness, increased warmth,  or erythema.  Lymphadenopathy:    He has no cervical adenopathy.  Neurological: He is alert.  Skin: Skin is warm and dry. He is not diaphoretic.  Psychiatric: He has a normal mood and affect. His behavior is normal.  Nursing note and vitals reviewed.    ED Treatments / Results  Labs (all labs ordered are listed, but only abnormal results are displayed) Labs Reviewed  BASIC METABOLIC PANEL - Abnormal; Notable for the following components:      Result Value   Glucose, Bld 122 (*)    Creatinine, Ser 1.37 (*)    GFR calc non Af Amer 48 (*)    GFR calc Af Amer 56 (*)    All other components within normal limits  HEPATIC FUNCTION PANEL - Abnormal; Notable for the following components:   Total Bilirubin 3.0 (*)    Bilirubin, Direct 0.5 (*)    Indirect Bilirubin 2.5 (*)    All other components within normal limits  TROPONIN I - Abnormal; Notable for the following components:   Troponin  I 0.10 (*)    All other components within normal limits  CBC  BRAIN NATRIURETIC PEPTIDE  TROPONIN I  TROPONIN I  TROPONIN I  I-STAT TROPONIN, ED    EKG EKG Interpretation  Date/Time:  Tuesday March 08 2018 11:15:09 EDT Ventricular Rate:  66 PR Interval:  236 QRS Duration: 90 QT Interval:  502 QTC Calculation: 526 R Axis:   -153 Text Interpretation:  Sinus rhythm with 1st degree A-V block Right superior axis deviation Right ventricular hypertrophy Septal infarct , age undetermined Prolonged QT Abnormal ECG No significant change since last tracing Confirmed by Orlie Dakin [phone]) on 03/08/2018 4:34:43 PM   Radiology Dg Chest 2 View  Result Date: 03/08/2018 CLINICAL DATA:  Shortness of breath and weakness. EXAM: CHEST - 2 VIEW COMPARISON:  04/17/2016 FINDINGS: Enlarged cardiac silhouette. Calcific atherosclerotic disease and tortuosity of the aorta. No evidence of pneumothorax. Moderate to large right pleural effusion. Airspace disease in the right lower lobe cannot be excluded. Osseous  structures are without acute abnormality. Soft tissues are grossly normal. IMPRESSION: Moderate to large right pleural effusion. The density in the superior segment of the right lower lobe may represent airspace disease or potentially pulmonary nodule. Enlarged cardiac silhouette. Calcific atherosclerotic disease and tortuosity of the aorta. Electronically Signed   By: Fidela Salisbury M.D.   On: 03/08/2018 12:32   Ct Angio Chest Pe W And/or Wo Contrast  Result Date: 03/08/2018 CLINICAL DATA:  Shortness of breath. EXAM: CT ANGIOGRAPHY CHEST WITH CONTRAST TECHNIQUE: Multidetector CT imaging of the chest was performed using the standard protocol during bolus administration of intravenous contrast. Multiplanar CT image reconstructions and MIPs were obtained to evaluate the vascular anatomy. CONTRAST:  80 mL ISOVUE-370 IOPAMIDOL (ISOVUE-370) INJECTION 76% COMPARISON:  Radiographs of same day.  CT scan of April 12, 2016. FINDINGS: Cardiovascular: Atherosclerosis of thoracic aorta is noted without aneurysm formation. Mild cardiomegaly is noted. No pericardial effusion is noted. Mediastinum/Nodes: No enlarged mediastinal, hilar, or axillary lymph nodes. Thyroid gland, trachea, and esophagus demonstrate no significant findings. Lungs/Pleura: No pneumothorax is noted. Large right pleural effusion is noted with complete atelectasis of the right lower lobe and mild atelectasis of the right upper and middle lobes. Left lung is clear. Upper Abdomen: Stable left hepatic cyst is noted. No other abnormality seen in visualized portion of upper abdomen. Musculoskeletal: No significant osseous abnormality is noted. 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 Review of the MIP images confirms the above findings. IMPRESSION: Large right pleural effusion is noted with complete atelectasis of the right lower lobe and mild subsegmental atelectasis of  the right upper and middle lobes. 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 Aortic Atherosclerosis (ICD10-I70.0). Electronically Signed   By: Marijo Conception, M.D.   On: 03/08/2018 16:00    Procedures Procedures (including critical care time)  Medications Ordered in ED Medications  furosemide (LASIX) injection 40 mg (has no administration in time range)  iopamidol (ISOVUE-370) 76 % injection 100 mL (80 mLs Intravenous Contrast Given 03/08/18 1527)     Initial Impression / Assessment and Plan / ED Course  I have reviewed the triage vital signs and the nursing notes.  Pertinent labs & imaging results that were available during my care of the patient were reviewed by me and considered in my medical decision making (see chart for details).  Clinical Course as of  Mar 08 1636  Tue Mar 08, 2018  1530 Spoke with April, [place] resident. Agrees to evaluate patient for admission.    [SJ]    Clinical Course User Index [SJ] Jahira Swiss C, PA-C   Patient presents with shortness of breath over several months.  Large right pleural effusion noted on chest x-ray and confirmed on CT.  Patient asymptomatic at rest, but conversationally and exertionally dyspneic.  Admission for further management.  Findings and plan of care discussed with Orlie Dakin, MD. Dr. Winfred Leeds personally evaluated and examined this patient.  Vitals:   03/08/18 1448 03/08/18 1540 03/08/18 1545 03/08/18 1600  BP: 104/80 112/85  109/83  Pulse: (!) 56  (!) 56   Resp: (!) 22 18 (!) 21 19  Temp:      SpO2: 93% 94% 96%   Weight:      Height:         Final Clinical Impressions(s) / ED Diagnoses   Final diagnoses:  Pleural effusion  Shortness of breath    ED Discharge Orders    None       Layla Maw 03/08/18 1639    Orlie Dakin, MD 03/08/18 2251

## 2018-03-08 NOTE — ED Notes (Signed)
Patient transported to CT 

## 2018-03-09 ENCOUNTER — Inpatient Hospital Stay (HOSPITAL_COMMUNITY): Payer: No Typology Code available for payment source

## 2018-03-09 ENCOUNTER — Other Ambulatory Visit (HOSPITAL_COMMUNITY): Payer: Non-veteran care

## 2018-03-09 DIAGNOSIS — I5043 Acute on chronic combined systolic (congestive) and diastolic (congestive) heart failure: Secondary | ICD-10-CM

## 2018-03-09 DIAGNOSIS — I4891 Unspecified atrial fibrillation: Secondary | ICD-10-CM

## 2018-03-09 LAB — CBC
HEMATOCRIT: 44.1 % (ref 39.0–52.0)
Hemoglobin: 13.7 g/dL (ref 13.0–17.0)
MCH: 26.7 pg (ref 26.0–34.0)
MCHC: 31.1 g/dL (ref 30.0–36.0)
MCV: 85.8 fL (ref 80.0–100.0)
Platelets: 199 10*3/uL (ref 150–400)
RBC: 5.14 MIL/uL (ref 4.22–5.81)
RDW: 13.2 % (ref 11.5–15.5)
WBC: 9.2 10*3/uL (ref 4.0–10.5)
nRBC: 0 % (ref 0.0–0.2)

## 2018-03-09 LAB — BASIC METABOLIC PANEL
Anion gap: 11 (ref 5–15)
BUN: 19 mg/dL (ref 8–23)
CALCIUM: 9.1 mg/dL (ref 8.9–10.3)
CHLORIDE: 101 mmol/L (ref 98–111)
CO2: 24 mmol/L (ref 22–32)
CREATININE: 1.19 mg/dL (ref 0.61–1.24)
GFR calc Af Amer: >60 mL/min (ref >60)
GFR calc non Af Amer: 57 mL/min — ABNORMAL LOW (ref >60)
Glucose, Bld: 89 mg/dL (ref 70–99)
Potassium: 3.8 mmol/L (ref 3.5–5.1)
SODIUM: 136 mmol/L (ref 135–145)

## 2018-03-09 LAB — C-REACTIVE PROTEIN: CRP: <0.8 mg/dL (ref <1.0)

## 2018-03-09 LAB — TROPONIN I: TROPONIN I: 0.12 ng/mL — AB (ref <0.03)

## 2018-03-09 LAB — HIV ANTIBODY (ROUTINE TESTING W REFLEX): HIV SCREEN 4TH GENERATION: NONREACTIVE

## 2018-03-09 LAB — BILIRUBIN, FRACTIONATED(TOT/DIR/INDIR)
BILIRUBIN INDIRECT: 2.3 mg/dL — AB (ref 0.3–0.9)
Bilirubin, Direct: 0.5 mg/dL — ABNORMAL HIGH (ref 0.0–0.2)
Total Bilirubin: 2.8 mg/dL — ABNORMAL HIGH (ref 0.3–1.2)

## 2018-03-09 LAB — TSH: TSH: 4.431 u[IU]/mL (ref 0.350–4.500)

## 2018-03-09 MED ORDER — FUROSEMIDE 10 MG/ML IJ SOLN: 40.0000 mg | Freq: Once | INTRAMUSCULAR | Status: DC

## 2018-03-09 MED ORDER — FUROSEMIDE 10 MG/ML IJ SOLN
40.0000 mg | Freq: Once | INTRAMUSCULAR | Status: AC
  Administered 2018-03-09: 40 mg via INTRAVENOUS
  Filled 2018-03-09: qty 4

## 2018-03-09 MED ORDER — FUROSEMIDE 10 MG/ML IJ SOLN: 40.0000 mg | Freq: Two times a day (BID) | INTRAMUSCULAR | Status: DC

## 2018-03-09 MED ORDER — POTASSIUM CHLORIDE CRYS ER 20 MEQ PO TBCR
40.0000 meq | EXTENDED_RELEASE_TABLET | Freq: Once | ORAL | Status: AC
  Administered 2018-03-09: 40 meq via ORAL
  Filled 2018-03-09: qty 2

## 2018-03-09 MED ORDER — DM-GUAIFENESIN ER 30-600 MG PO TB12
1.0000 | ORAL_TABLET | Freq: Two times a day (BID) | ORAL | Status: DC
  Administered 2018-03-09 – 2018-03-10 (×4): 1 via ORAL
  Filled 2018-03-09 (×4): qty 1

## 2018-03-09 NOTE — Progress Notes (Signed)
Paged MD on call that [facility] faxed patient results from yesterday.Placed on the patient chart. MD aware.   Tanija Germani, RN

## 2018-03-09 NOTE — Consult Note (Addendum)
Cardiology Consultation:   Patient ID: Christopher Benton; [id number]; 27-Jan-1941   Admit date: 03/08/2018 Date of Consult: 03/09/2018  Primary Care Provider: System, Pcp Not In Primary Cardiologist: No primary care provider on file. Primary Electrophysiologist:  None   Patient Profile:   Christopher Benton is a 77 y.o. male with a PMH of CAD on CT scans, chronic systolic CHF, atrial fibrillation/flutter on tikosyn, HTN, who is being seen today for the evaluation of CHF and atrial fibrillation at the request of Dr. Gwendlyn Deutscher.  History of Present Illness:   Christopher Benton reports progressively worsening DOE for the past 6 months, acutely worsening over the past 2-3 weeks. Prior to this he was able to walk 5 miles without difficulty but has been limited to 0.25 miles in recent weeks. He denies having chest pain. He has had some LE edema but denies weight gain. He increased his lasix to 20mg  BID to try to manage his symptoms at home. He has had some dietary indiscretions and reports eating more than the recommended amount of salt. He states his symptoms felt similar to his admission for CHF exacerbation in 2017, prompting him to present to the ED for further evaluation.   He was last hospitalized in 2017 for new onset atrial fibrillation and was noted to have a large pleural effusion at that time requiring thoracentesis. He was started on tikosyn for rhythm control and has done fairly well since that time. He reports following with cardiology at the [facility], [facility], PA-C, and was last seen 03/08/18 at which time he reported DOE and fatigue with minimal activity. Last echo 10/2017 with EF 35-40% (previously 40-45% 05/2017), G3DD, LVH, mild-mod TR, and biatrial enlargement - noted to be concerning for possible cardiac amyloid. He had a CXR 03/08/18 which showed a moderate right pleural effusion and he was recommended to present to [facility] ED for further management.   Hospital course: VSS. Labs notable for  electrolytes wnl, Cr 1.37>1.19, CBC wnl, bilirubin elevated, AST/ALT wnl, Trop 0.1>0.11>0.12, BNP 716, TSH wnl, CRP wnl. EKG with sinus rhythm with 1st degree AV block, low voltage, no STE/D, no TWI, QTc 505. CXR with moderate-large R pleural effusion; calcific atherosclerotic disease and tortuosity of the aorta. CTA Chest without PE but confirms large right pleural effusion, R lobe atelectasis, and aortic atherosclerosis. He was given IV lasix 40mg  x1 dose yesterday with -1.4L in the past 24 hours. Weight 251>246.9lbs. Patient was admitted to medicine. Cardiology asked to evaluate for CHF.    Past Medical History:  Diagnosis Date  . Atrial fibrillation ([place])   . CAD (coronary artery disease)   . Cardiomyopathy ([place])   . Chronic systolic CHF (congestive heart failure) ([place]) 04/12/2016  . Dyspnea   . Dysrhythmia Atrial flutter  . Hypertension     Past Surgical History:  Procedure Laterality Date  . CARDIOVERSION N/A 09/04/2015   Procedure: CARDIOVERSION;  Surgeon: Skeet Latch, MD;  Location: [place];  Service: Cardiovascular;  Laterality: N/A;  . TEE WITHOUT CARDIOVERSION N/A 09/04/2015   Procedure: TRANSESOPHAGEAL ECHOCARDIOGRAM (TEE);  Surgeon: Skeet Latch, MD;  Location: [place];  Service: Cardiovascular;  Laterality: N/A;  . TEE WITHOUT CARDIOVERSION N/A 04/15/2016   Procedure: TRANSESOPHAGEAL ECHOCARDIOGRAM (TEE);  Surgeon: Jerline Pain, MD;  Location: [facility] ENDOSCOPY;  Service: Cardiovascular;  Laterality: N/A;  . TOE AMPUTATION       Home Medications:  Prior to Admission medications   Medication Sig Start Date End Date Taking? Authorizing Provider  apixaban (  ELIQUIS) 5 MG TABS tablet Take 1 tablet (5 mg total) by mouth 2 (two) times daily. 08/28/15  Yes Lelon Perla, MD  carvedilol (COREG) 25 MG tablet Take 1 tablet (25 mg total) by mouth 2 (two) times daily. 02/18/16 03/08/18 Yes Lelon Perla, MD  dofetilide (TIKOSYN) 500 MCG capsule Take 1 capsule (500 mcg  total) by mouth 2 (two) times daily. 04/28/16  Yes Sherran Needs, NP  dorzolamide (TRUSOPT) 2 % ophthalmic solution Place 1 drop into both eyes 2 (two) times daily.   Yes [provider]  furosemide (LASIX) 40 MG tablet Take 1 tablet (40 mg total) by mouth daily as needed. Take one tablet as needed if you notice weight gain of >3 lbs in 24-48 hours and also notify your cardiologist or primary doctor. Patient taking differently: Take 40 mg by mouth daily as needed for fluid (if you notice weight gain of >3 lbs in 24-48 hours and also notify your cardiologist or primary doctor.).  04/18/16  Yes Theodis Blaze, MD  latanoprost (XALATAN) 0.005 % ophthalmic solution Place 1 drop into both eyes at bedtime.   Yes [provider]  losartan (COZAAR) 25 MG tablet Take 12.5 mg by mouth daily.    Yes [provider]  Multiple Vitamins-Minerals (MULTIVITAMIN WITH MINERALS) tablet Take 1 tablet by mouth daily.   Yes [provider]  potassium chloride (K-DUR,KLOR-CON) 10 MEQ tablet Take 1 tablet (10 mEq total) by mouth daily. 04/29/16  Yes Sherran Needs, NP  spironolactone (ALDACTONE) 25 MG tablet Take 0.5 tablets (12.5 mg total) by mouth daily. 04/28/16  Yes Sherran Needs, NP  dofetilide (TIKOSYN) 500 MCG capsule Take 1 capsule (500 mcg total) by mouth 2 (two) times daily. Patient not taking: Reported on 03/08/2018 04/28/16   Sherran Needs, NP    Inpatient Medications: Scheduled Meds: . apixaban  5 mg Oral BID  . carvedilol  25 mg Oral BID  . dextromethorphan-guaiFENesin  1 tablet Oral BID  . dofetilide  500 mcg Oral BID  . dorzolamide  1 drop Both Eyes BID  . latanoprost  1 drop Both Eyes QHS  . losartan  12.5 mg Oral Daily  . spironolactone  12.5 mg Oral Daily   Continuous Infusions:  PRN Meds: acetaminophen **OR** acetaminophen, polyethylene glycol  Allergies:    Allergies  Allergen Reactions  . Pradaxa [Dabigatran Etexilate Mesylate] Nausea Only      Social History:   Social History   Socioeconomic History  . Marital status: Married    Spouse name: Not on file  . Number of children: 2  . Years of education: Not on file  . Highest education level: Not on file  Occupational History  . Not on file  Social Needs  . Financial resource strain: Not on file  . Food insecurity:    Worry: Not on file    Inability: Not on file  . Transportation needs:    Medical: Not on file    Non-medical: Not on file  Tobacco Use  . Smoking status: Former Research scientist (life sciences)  . Smokeless tobacco: Never Used  Substance and Sexual Activity  . Alcohol use: Yes    Alcohol/week: 0.0 standard drinks    Comment: Occasional  . Drug use: No  . Sexual activity: Not on file  Lifestyle  . Physical activity:    Days per week: Not on file    Minutes per session: Not on file  . Stress: Not on file  Relationships  . Social connections:    Talks on phone: Not on file    Gets together: Not on file    Attends religious service: Not on file    Active member of club or organization: Not on file    Attends meetings of clubs or organizations: Not on file    Relationship status: Not on file  . Intimate partner violence:    Fear of current or ex partner: Not on file    Emotionally abused: Not on file    Physically abused: Not on file    Forced sexual activity: Not on file  Other Topics Concern  . Not on file  Social History Narrative  . Not on file    Family History:    Family History  Problem Relation Age of Onset  . Heart disease Unknown        No family history     ROS:  Please see the history of present illness.   All other ROS reviewed and negative.     Physical Exam/Data:   Vitals:   03/09/18 0040 03/09/18 0545 03/09/18 0854 03/09/18 1121  BP: 103/66 98/63 105/71 100/68  Pulse: 63 66 63 68  Resp: 18 18  18   Temp: 97.8 F (36.6 C) 98.3 F (36.8 C)  98.2 F (36.8 C)  TempSrc: Oral Oral  Oral  SpO2: 96% 93% 93% 95%  Weight:  112 kg     Height:        Intake/Output Summary (Last 24 hours) at 03/09/2018 1401 Last data filed at 03/09/2018 1314 Gross per 24 hour  Intake 720 ml  Output 2025 ml  Net -1305 ml   Filed Weights   03/08/18 1148 03/08/18 1702 03/09/18 0545  Weight: 116.6 kg 113.9 kg 112 kg   Body mass index is 33.96 kg/m.  General:  Well nourished, well developed, laying in bed in no acute distress HEENT: sclera anicteric  Neck: no JVD appreciated Vascular: No carotid bruits; distal pulses 2+ bilaterally Cardiac:  normal S1, S2; RRR; no murmurs, rubs, or gallops Lungs:  clear to auscultation bilaterally with the exception of diminished breaths sounds at right lung base; no wheezes/rales/rhonchi Abd: NABS, soft, nontender, no hepatomegaly Ext: 1+ edema bilaterally Musculoskeletal:  No deformities, BUE and BLE strength normal and equal Skin: warm and dry  Neuro:  CNs 2-12 intact, no focal abnormalities noted Psych:  Normal affect   EKG:  The EKG was personally reviewed and demonstrates:  sinus rhythm with 1st degree AV block, low voltage, no STE/D, no TWI, QTc 505 Telemetry:  Telemetry was personally reviewed and demonstrates:  Sinus rhythm with 1st degree AV block, PAC  Relevant CV Studies: OSH Echocardiogram 10/2017: reviewed in paper chart - EF 35-40% - G3DD - Moderate concentric LVH - Mild-mod TR - Biatrial enlargement - Bullseye pattern on strain imaging in conjunction with moderate LVH, biatrial enlargement, and G3DD raises the possibility of cardiac amyloid  Laboratory Data:  Chemistry Recent Labs  Lab 03/08/18 1156 03/09/18 0411  NA 135 136  K 4.7 3.8  CL 100 101  CO2 22 24  GLUCOSE 122* 89  BUN 19 19  CREATININE 1.37* 1.19  CALCIUM 9.6 9.1  GFRNONAA 48* 57*  GFRAA 56* >60  ANIONGAP 13 11    Recent Labs  Lab 03/08/18 1311 03/09/18 0411  PROT 7.3  --   ALBUMIN 4.1  --   AST 24  --   ALT 17  --   ALKPHOS 75  --  BILITOT 3.0* 2.8*   Hematology Recent Labs  Lab  03/08/18 1156 03/09/18 0411  WBC 10.1 9.2  RBC 5.27 5.14  HGB 14.7 13.7  HCT 46.1 44.1  MCV 87.5 85.8  MCH 27.9 26.7  MCHC 31.9 31.1  RDW 13.4 13.2  PLT 199 199   Cardiac Enzymes Recent Labs  Lab 03/08/18 1311 03/08/18 1712 03/08/18 2142 03/09/18 0411  TROPONINI 0.10* 0.11* 0.11* 0.12*    Recent Labs  Lab 03/08/18 1200  TROPIPOC 0.07    BNP Recent Labs  Lab 03/08/18 1454  BNP 716.6*    DDimer No results for input(s): DDIMER in the last 168 hours.  Radiology/Studies:  Dg Chest 2 View  Result Date: 03/09/2018 CLINICAL DATA:  Shortness of breath.  Atrial fibrillation. EXAM: CHEST - 2 VIEW COMPARISON:  April 17, 2016 FINDINGS: There is a sizable pleural effusion on the right. There is consolidation throughout much of the right middle and lower lobe. The left lung is clear. Heart is upper normal in size with pulmonary vascularity normal. No adenopathy. There is aortic atherosclerosis. There is degenerative change in the thoracic spine. IMPRESSION: Consolidation throughout the right middle and lower lobes with right pleural effusion. It should be noted that a lesion involving the right bronchus intermedius could account for these findings. Given this appearance, correlation with contrast enhanced chest CT may well be advisable. Left lung clear. Heart upper normal in size. There is aortic atherosclerosis. Aortic Atherosclerosis (ICD10-I70.0). Electronically Signed   By: Lowella Grip III M.D.   On: 03/09/2018 11:31   Dg Chest 2 View  Result Date: 03/08/2018 CLINICAL DATA:  Shortness of breath and weakness. EXAM: CHEST - 2 VIEW COMPARISON:  04/17/2016 FINDINGS: Enlarged cardiac silhouette. Calcific atherosclerotic disease and tortuosity of the aorta. No evidence of pneumothorax. Moderate to large right pleural effusion. Airspace disease in the right lower lobe cannot be excluded. Osseous structures are without acute abnormality. Soft tissues are grossly normal. IMPRESSION:  Moderate to large right pleural effusion. The density in the superior segment of the right lower lobe may represent airspace disease or potentially pulmonary nodule. Enlarged cardiac silhouette. Calcific atherosclerotic disease and tortuosity of the aorta. Electronically Signed   By: Fidela Salisbury M.D.   On: 03/08/2018 12:32   Ct Angio Chest Pe W And/or Wo Contrast  Result Date: 03/08/2018 CLINICAL DATA:  Shortness of breath. EXAM: CT ANGIOGRAPHY CHEST WITH CONTRAST TECHNIQUE: Multidetector CT imaging of the chest was performed using the standard protocol during bolus administration of intravenous contrast. Multiplanar CT image reconstructions and MIPs were obtained to evaluate the vascular anatomy. CONTRAST:  80 mL ISOVUE-370 IOPAMIDOL (ISOVUE-370) INJECTION 76% COMPARISON:  Radiographs of same day.  CT scan of April 12, 2016. FINDINGS: Cardiovascular: Atherosclerosis of thoracic aorta is noted without aneurysm formation. Mild cardiomegaly is noted. No pericardial effusion is noted. Mediastinum/Nodes: No enlarged mediastinal, hilar, or axillary lymph nodes. Thyroid gland, trachea, and esophagus demonstrate no significant findings. Lungs/Pleura: No pneumothorax is noted. Large right pleural effusion is noted with complete atelectasis of the right lower lobe and mild atelectasis of the right upper and middle lobes. Left lung is clear. Upper Abdomen: Stable left hepatic cyst is noted. No other abnormality seen in visualized portion of upper abdomen. Musculoskeletal: No significant osseous abnormality is noted. 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 Review of the MIP images confirms the above findings. IMPRESSION: Large right pleural effusion is noted with complete  atelectasis of the right lower lobe and mild subsegmental atelectasis of the right upper and middle lobes. 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 Aortic Atherosclerosis (ICD10-I70.0). Electronically Signed   By: Marijo Conception, M.D.   On: 03/08/2018 16:00    Assessment and Plan:   1. Acute on chronic combined CHF and recurrent large right pleural effusion: patient presented with progressive DOE x 6 months, acutely worsening in the past 2 weeks. CTA Chest without PE but shows large right pleural effusion. BNP 700s, previously in the 300s in 2017 during admission for CHF exacerbation, at which time he had a large pleural effusion reuqiring thoracentesis. Echo at the [facility] 10/2017 with EF 35-40% and G3DD, also question of cardiac amyloid mentioned given bulleye pattern on strain imaging, moderate LVH, biatrial enlargement, and G3DD. He was given IV lasix 40mg  x1 dose yesterday with -1.4L in the past 24 hours. Weight 251>246.9lbs.  - Suspect he will likely benefit from a thoracentesis again - Continue IV lasix 40mg  daily for now given good response - Continue carvedilol, losartan, and spironolactone. Could consider transition to entresto going forward - Continue to monitor strict I&Os and daily weights - Will discuss with Dr. Harrington Challenger whether amyloid work-up should be pursued given recent echo  2. Elevated troponin: Trop trend flat at 0.1>0.11>0.12. He has known CAD noted on prior CT scans and again this admission. He has not had any complaints of chest pain. Trop trend not consistent with ACS and more likely demand ischemia in the setting of acute on chronic CHF.  - No further ischemic work-up at this time - Not on ASA given need for anticoagulation  3. Atrial fibrillation/flutter: Maintaining sinus rhythm on Tikosyn. On eliquis 5mg  BID for stroke prevention given CHA2DS2-VASc Score of 5 (CHF, HTN, Vasc, Age>75). QTc borderline on EKGs this admission.  - Check Mg level with PM BMET - goal to maintain K>4, Mg >2 - replete as needed - Okay to continue tikosyn for now - Daily EKG for QTc  monitoring - Continue eliquis for stroke prevention  4. HTN: BP stable - Continue current regimen    For questions or updates, please contact [place] Please consult [email or web address] for contact info under Cardiology/STEMI.   Signed, Abigail Butts, PA-C  03/09/2018 2:01 PM [phone]  Patient seen and examined   I agree with findings as noted by K Kroeger Pt is a 77 yo with istory of CAD on CTscans, systolic CHF, atrial fibrillation/flutter (on Tikosyn), HTN Presented to [facility] with worsening SOB and LE edema over the past few wks The patient admits that during this time he was not compliant with diet   Ate hot dogs, bologna, etc as wife was out of town  The pt is followd at [place] for cardiac care ([place])   Seen about every 3 months   Last seen on 03/08/18 when he was told to go to [facility] due to volume overload    Myovue in Aug 2017 Normal perfusion.  LVEF mildly depressed. Last echo at [facility] clinic June 2019 Moderate LVH   LVEf 35 to 41%  Gr III diastolic dysfunction    ? Possible amyloid  Currently the pt is breathing some better   ANkles are down since starting IV lasix  Neck   JVP is increased Lungs Decreased BS at R base\ Cardiac RRR   No S3    No signif murmurs Ext  Tr  Edema  EKG  SR 65  First degree AV block  Low voltage in limb leads   1  CHF   Agree with IV lasix   Watch K Would recomm Repeat echo here to compare images to 2017   (LV thickness, LA size)    Suspicious for amyloid with low voltage on EKG  2   Atrial fibrillation   Controlled on Tikosyn  Watch electrolytes      3   CAD   No symtpoms to sugg angina   Never had cath, just myovue  4  HTN   Follow  Dorris Carnes

## 2018-03-09 NOTE — Progress Notes (Signed)
Patient ambulated with NT while O2 saturation was checked. Patient was maintaining O2 between 93-95%. After some time he was SOB and O2 dropped to 90 % on RA. After he sat down oxygen went back to 95 % per NT report.  Natalya Domzalski, RN

## 2018-03-09 NOTE — Progress Notes (Cosign Needed)
Family Medicine Teaching Service Daily Progress Note Intern Pager: [phone]  Patient name: Christopher Benton Medical record number: [id number] Date of birth: 05-Jan-1941 Age: 77 y.o. Gender: male  Primary Care Provider: System, Pcp Not In Consultants: Cardiology Code Status: Full  Pt Overview and Major Events to Date:  Patient overall remained clinically stable. His troponin increased from 0.1 to 0.11 overnight and the cardiology fellow was paged, see further details below. Pt denies chest pain, SOB, n/v/d since admission.  Assessment and Plan: Christopher Benton is a 77 y.o. male presenting with a 6 month history of progressive shortness of breath in the setting of a large R. Pleural effusion admtitted for concerns of a HFrEF exacerbation. 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  Shortness of Breath with Large Right-sided Pleural Effusion: Likely secondary to HFrEF exacerbation. O2 sats 91-96 and pt remains ORA. Physical exam is significant for hypervolemia in LE, R>L. Lung fields improved without end expiratory wheezes but right lung sounds remained diminished relative to left. Pt is net negative 1.5L since admission with weight 111.9 kg, down from 116.6 kg on admission s/p 1 dose of 40mg  IV lasix. Repeat chest xray without radiographic improvement. Received records from [place]. Recent echo from 10/2017 showing EF 35-40%, G3DD. Echo no longer needed. - F/U cards recs, appreciate their assistance - D/C echo - Monitor on telemetry - Strict I/Os and daily weights - Continue 40mg  IV lasix daily.   Troponinemia:  Serum troponin 0.1-->0.11-->0.11-->0.12. Pt denies chest pain, n/v/d. Admission EKG without ischemic changes. Repeat EKG still without ischemic changes, unchanged from admission though QTc improved from 526 to 502. Given clinical picture, most likely demand ischemia in the setting of HFrEF and possible exacerbation. Pt with known  CAD.  -CTM clinical symptoms of ischemia -Cards consulted, appreciate recs  Hyperbilirubinemia, chronic: Tbili on admission 3.0 with direct bili 0.5. Repeat bili 2.8/0.5. Appears to be near baseline. Physical exam without evidence of jaundice or hepatomegaly. No abdominal distention and LFTs WNLs. Likely Rosanna Randy given rise in indirect.  -no workup indicated as inpatient -Will CTM for now  HFrEF: Likely the etiology of his shortness of breath. Last echo was 10/2017 ago with LVEF 35-40% and G3DD.  -40 mg IV Lasix as per above -Continue home meds Coreg 25 BID, Losartan 12.5mg  daily, and sprinolactone 12.5mg  daily.   Afib, chronic and stable: Pt remains asymptomatic. Repeat EKG without afib, normal sinus rhthym.  -Continue home dose of 561mcg Tikosyn BID and Eliquis 25mg  daily -Appreciate recs from cards  Hypokalemia: Potassium 4.7 on admission now 3.8 after ongoing diuresis -Will replete pending repeat pm BMP  FEN/GI: heart healthy Prophylaxis: Eliquis   Disposition:  pending clinical improvement, likely home  Subjective:  Pt reports he's doing somewhat better. Says he is less short of breath adjusting himself in bed. He is tolerating PO well and voiding appropriately. He has not been able to walk yet but is able to sit on the side of the bed w/o difficulty. Reports he has ongoing coughing/mucous production which is chronic, denies headache, chest pain, n/v/d.   Objective: Temp:  [97.7 F (36.5 C)-98.7 F (37.1 C)] 98.3 F (36.8 C) (10/09 0545) Pulse Rate:  [56-86] 66 (10/09 0545) Resp:  [17-22] 18 (10/09 0545) BP: (98-113)/(63-90) 98/63 (10/09 0545) SpO2:  [91 %-96 %] 93 % (10/09 0545) Weight:  [009 kg-116.6 kg] 112 kg (10/09 0545) Physical Exam: General: Well appearing, sitting up on the side of  the bed. No distress Cardiovascular: Distant heart sounds, normal S1/S2. JVD improved but present, well perfused extremities Respiratory: CTAB but diminished breath sounds on the right  lung. No crackles or wheezes.  Abdomen: Soft, nontender, nondistended. No hepatosplenomegaly. Extremities: 2+ pitting edema to the knee on the right, likely dependent/positional. Left with pitting edema to the mid shin, nonpitting to knee, much improved from admission.   Laboratory: Recent Labs  Lab 03/08/18 1156 03/09/18 0411  WBC 10.1 9.2  HGB 14.7 13.7  HCT 46.1 44.1  PLT 199 199   Recent Labs  Lab 03/08/18 1156 03/08/18 1311 03/09/18 0411  NA 135  --  136  K 4.7  --  3.8  CL 100  --  101  CO2 22  --  24  BUN 19  --  19  CREATININE 1.37*  --  1.19  CALCIUM 9.6  --  9.1  PROT  --  7.3  --   BILITOT  --  3.0* 2.8*  ALKPHOS  --  75  --   ALT  --  17  --   AST  --  24  --   GLUCOSE 122*  --  89   Troponin 0.1-->0.11-->0.11-->0.12 Tbili 2.8, Direct Bili 0.5 TSH: 4.431  Imaging/Diagnostic Tests: Chest Xray:  IMPRESSION: Consolidation throughout the right middle and lower lobes with right pleural effusion. It should be noted that a lesion involving the right bronchus intermedius could account for these findings. Given this appearance, correlation with contrast enhanced chest CT may well be advisable.  Left lung clear. Heart upper normal in size. There is aortic Atherosclerosis.  EKG: Sinus rhythm, 1st degree AV block, no ST elevations/depressions.   Echo: Pending  Terance Hart, April A, Medical Student 03/09/2018, 8:12 AM FPTS Intern pager: [phone], text pages welcome ----------------------------------------------------------------------------------------------------- Upper Level Addendum: I have seen and evaluated this patient along with April Peterson MS-3 and reviewed the above note, making necessary revisions in brown.  Guadalupe Dawn MD PGY-2 Family Medicine Resident

## 2018-03-09 NOTE — Progress Notes (Signed)
Received records from [place]. Patient had recent echo from 11/15/2017. Showed EF 35-40% and G3DD. Records can be found in his paper chart. Will get these scanned in. Cancelling echo due to recency of [facility] echo.  Guadalupe Dawn MD PGY-2 Family Medicine Resident

## 2018-03-09 NOTE — Discharge Instructions (Addendum)
It has been a pleasure taking care of you! You were admitted due to a heart failure. We have treated you with diuretics. With that your symptoms improved to the point we think it is safe to let you go home and follow up with your regular doctor. We also evaluated you for possible amyloidosis. Your doctor will help you follow up the results as an outpatient   There could be some changes made to your home medications during this hospitalization. Please, make sure to read the directions before you take them. The names and directions on how to take these medications are found on this discharge paper under medication section.  Please follow up at your primary care doctor's office. Please call and make a hospital follow up appointment to be seen within the next 1 week.   Information on my medicine - ELIQUIS (apixaban)  Why was Eliquis prescribed for you? Eliquis was prescribed for you to reduce the risk of a blood clot forming that can cause a stroke if you have a medical condition called atrial fibrillation (a type of irregular heartbeat).  What do You need to know about Eliquis ? Take your Eliquis TWICE DAILY - one tablet in the morning and one tablet in the evening with or without food. If you have difficulty swallowing the tablet whole please discuss with your pharmacist how to take the medication safely.  Take Eliquis exactly as prescribed by your doctor and DO NOT stop taking Eliquis without talking to the doctor who prescribed the medication.  Stopping may increase your risk of developing a stroke.  Refill your prescription before you run out.  After discharge, you should have regular check-up appointments with your healthcare provider that is prescribing your Eliquis.  In the future your dose may need to be changed if your kidney function or weight changes by a significant amount or as you get older.  What do you do if you miss a dose? If you miss a dose, take it as soon as you  remember on the same day and resume taking twice daily.  Do not take more than one dose of ELIQUIS at the same time to make up a missed dose.  Important Safety Information A possible side effect of Eliquis is bleeding. You should call your healthcare provider right away if you experience any of the following: ? Bleeding from an injury or your nose that does not stop. ? Unusual colored urine (red or dark brown) or unusual colored stools (red or black). ? Unusual bruising for unknown reasons. ? A serious fall or if you hit your head (even if there is no bleeding).  Some medicines may interact with Eliquis and might increase your risk of bleeding or clotting while on Eliquis. To help avoid this, consult your healthcare provider or pharmacist prior to using any new prescription or non-prescription medications, including herbals, vitamins, non-steroidal anti-inflammatory drugs (NSAIDs) and supplements.  This website has more information on Eliquis (apixaban): http://[email or web address].com/eliquis/home

## 2018-03-10 ENCOUNTER — Inpatient Hospital Stay (HOSPITAL_COMMUNITY): Payer: No Typology Code available for payment source

## 2018-03-10 DIAGNOSIS — I5023 Acute on chronic systolic (congestive) heart failure: Secondary | ICD-10-CM

## 2018-03-10 DIAGNOSIS — I5030 Unspecified diastolic (congestive) heart failure: Secondary | ICD-10-CM

## 2018-03-10 DIAGNOSIS — R748 Abnormal levels of other serum enzymes: Secondary | ICD-10-CM

## 2018-03-10 LAB — ECHOCARDIOGRAM COMPLETE
HEIGHTINCHES: 71.5 in
Weight: 3928 oz

## 2018-03-10 LAB — BASIC METABOLIC PANEL
ANION GAP: 8 (ref 5–15)
BUN: 16 mg/dL (ref 8–23)
CO2: 25 mmol/L (ref 22–32)
Calcium: 9.5 mg/dL (ref 8.9–10.3)
Chloride: 103 mmol/L (ref 98–111)
Creatinine, Ser: 1.15 mg/dL (ref 0.61–1.24)
GFR, EST NON AFRICAN AMERICAN: 60 mL/min — AB (ref >60)
Glucose, Bld: 92 mg/dL (ref 70–99)
Potassium: 4 mmol/L (ref 3.5–5.1)
SODIUM: 136 mmol/L (ref 135–145)

## 2018-03-10 LAB — MAGNESIUM: MAGNESIUM: 2 mg/dL (ref 1.7–2.4)

## 2018-03-10 LAB — ANA: Anti Nuclear Antibody(ANA): NEGATIVE

## 2018-03-10 MED ORDER — CARVEDILOL 12.5 MG PO TABS
12.5000 mg | ORAL_TABLET | Freq: Two times a day (BID) | ORAL | Status: DC
  Administered 2018-03-10: 12.5 mg via ORAL
  Filled 2018-03-10: qty 1

## 2018-03-10 MED ORDER — SPIRONOLACTONE 25 MG PO TABS: 12.5000 mg | ORAL_TABLET | Freq: Every day | ORAL | 0 refills | Status: DC

## 2018-03-10 MED ORDER — POTASSIUM CHLORIDE CRYS ER 20 MEQ PO TBCR: 20.0000 meq | EXTENDED_RELEASE_TABLET | Freq: Every day | ORAL | Status: DC

## 2018-03-10 MED ORDER — CARVEDILOL 12.5 MG PO TABS: 12.5000 mg | ORAL_TABLET | Freq: Two times a day (BID) | ORAL | 0 refills | Status: DC

## 2018-03-10 MED ORDER — FUROSEMIDE 10 MG/ML IJ SOLN: 40.0000 mg | Freq: Two times a day (BID) | INTRAMUSCULAR | Status: DC

## 2018-03-10 MED ORDER — TECHNETIUM TC 99M PYROPHOSPHATE
21.0000 | Freq: Once | INTRAVENOUS | Status: AC
  Administered 2018-03-10: 21 via INTRAVENOUS

## 2018-03-10 MED ORDER — MAGNESIUM OXIDE 400 (241.3 MG) MG PO TABS: 400.0000 mg | ORAL_TABLET | Freq: Once | ORAL | Status: DC

## 2018-03-10 MED ORDER — FUROSEMIDE 40 MG PO TABS: 80.0000 mg | ORAL_TABLET | Freq: Every day | ORAL | 0 refills | Status: DC | PRN

## 2018-03-10 MED ORDER — PROCHLORPERAZINE MALEATE 5 MG PO TABS
5.0000 mg | ORAL_TABLET | Freq: Four times a day (QID) | ORAL | Status: DC | PRN
  Filled 2018-03-10 (×2): qty 1

## 2018-03-10 MED ORDER — POTASSIUM CHLORIDE CRYS ER 20 MEQ PO TBCR
40.0000 meq | EXTENDED_RELEASE_TABLET | Freq: Once | ORAL | Status: AC
  Administered 2018-03-10: 40 meq via ORAL
  Filled 2018-03-10: qty 2

## 2018-03-10 MED ORDER — FUROSEMIDE 10 MG/ML IJ SOLN: 40.0000 mg | Freq: Once | INTRAMUSCULAR | Status: DC

## 2018-03-10 NOTE — Discharge Summary (Signed)
[facility] Discharge Summary  Patient name: Christopher Benton Medical record number: [id number] Date of birth: 1940/10/21 Age: 76 y.o. Gender: male Date of Admission: 03/08/2018  Date of Discharge: 03/10/2018 Admitting Physician: Kinnie Feil, MD  Primary Care Provider: System, Pcp Not In Consultants: Cardiology  Indication for Hospitalization: HFrEF Exacerbation  Discharge Diagnoses/Problem List:  Acute on Chronic HFrEF Afib/flutter, chronic and stable HTN, chronic Hyperbilirubinemia secondary to [facility] Syndrome  [place] Ischemia  Disposition: home  Discharge Condition: clinically stable and improved  Discharge Exam:  General: Well appearing, laying in bed with wife at bedside Cardiovascular: Normal S1/S2, distant heart sounds, no murmurs rubs or gallops Respiratory: CTAB, breath sounds diminished in right base. No crackles or wheezes. No signs of respiratory distress. Abdomen: Soft, nondistended, nontender to palpation. No guarding.  Extremities: Able to move all extremities w/o difficulty Neuro: Alert and oriented, able to respond to commands. No gross focal deficits  Brief Hospital Course:  Rakin Lemelle Ledbetteris a 77 y.o.malewho presented to the ED after a 6 month history of progressive SOB in the setting of acute on chronic HFrEF found to have a large R. Pleural effusion on imaging. He was found to be hypervolemic on exam with 2+nonpitting edema to his bilateral knees and diminished lung sounds bilaterally R>L. Cardiology was consulted who provided recommendations for his afib/flutter which was managed with Eliquis 5mg  BID and Tikosyn 564mcg daily (home dose). His electrolytes were closely monitored and repleted as needed. His BP was managed with his home doses of Coreg, Spirinolactone, and Losartan. He was diuresed with 40mg  IV lasix daily with appropriate urinary output and return to a baseline weight based on patient report. An echocardiogram  was performed which showed severe concentric hypertrophy with concerns for amyloid inflitrative disease which was also concerning on his previous echo performed at the [place] (see media tab for records). A pyrophosphate scan was performed for further workup the possible cardiac amyloidosis.   Of note, the patient was found to have an incidental indirect hyperbilirubinemia on admission which appears to be chronic. He was never jaundice or clinically symptomatic. The elevated bilirubin is likely secondary to [place] and unlikely requires additional workup.   After medical management of Mr. [name] HFrEF, his clinical status improved, he was maintained on room air and was able to ambulate without difficulty. On discharge he was tolerating PO liquids and solids and reported an overall improvement in his respiratory status. He was found suitable for discharge to home by the primary team.   Issues for Follow Up:  1. 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 Follow up with PCP regarding home BP management and fluid control. Sent home on increased lasix dose of 80mg  as needed.  3. Follow up with cardiology for Tikosyn maangement  Significant Procedures: Transthoracic echocardiogram  Significant Labs and Imaging:  Recent Labs  Lab 03/08/18 1156 03/09/18 0411  WBC 10.1 9.2  HGB 14.7 13.7  HCT 46.1 44.1  PLT 199 199   Recent Labs  Lab 03/08/18 1156 03/08/18 1311 03/09/18 0411 03/10/18 0425  NA 135  --  136 136  K 4.7  --  3.8 4.0  CL 100  --  101 103  CO2 22  --  24 25  GLUCOSE 122*  --  89 92  BUN 19  --  19 16  CREATININE 1.37*  --  1.19 1.15  CALCIUM 9.6  --  9.1 9.5  MG  --   --   --  2.0  ALKPHOS  --  75  --   --   AST  --  24  --   --   ALT  --  17  --   --   ALBUMIN  --  4.1  --   --    EKG: sinus rhythm with 1st degree AV block, low voltage, no STE/D, no TWI, QTc 505  Echocardiogram Study Conclusions - Left ventricle: The cavity size was normal.  There was severe   concentric hypertrophy. 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   Consider PYP scan. Systolic function was mildly reduced. The   estimated ejection fraction was in the range of 45% to 50%. There   is hypokinesis of the basalinferoseptal myocardium. Features are   consistent with a pseudonormal left ventricular filling pattern,   with concomitant abnormal relaxation and increased filling   pressure (grade 2 diastolic dysfunction). - Aortic valve: Trileaflet; moderately thickened, moderately   calcified leaflets. There was trivial regurgitation. - Aorta: Ascending aortic diameter: 38 mm (S). - Ascending aorta: The ascending aorta was mildly dilated. - Left atrium: The atrium was moderately to severely dilated. - Right atrium: The atrium was severely dilated. - Tricuspid valve: There was mild regurgitation. - Pulmonary arteries: PA peak pressure: 40 mm Hg (S).   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   carvedilol 12.5 MG tablet Commonly known as:  COREG Take 1 tablet (12.5 mg total) by mouth 2 (two) times daily. What changed:    medication strength  how much to take   dofetilide 500 MCG capsule Commonly known as:  TIKOSYN Take 1 capsule (500 mcg total) by mouth 2 (two) times daily. What changed:  Another medication with the same name was removed. Continue taking this medication, and follow the directions you see here.   dorzolamide 2 % ophthalmic solution Commonly known as:  TRUSOPT Place 1 drop into both eyes 2 (two) times daily.   furosemide 40 MG tablet Commonly known as:  LASIX Take 2 tablets (80 mg  total) by mouth daily as needed for fluid (for notice weight gain of >3 lbs in 24-48 hours and notify your doctor.). What changed:    how much to take  reasons to take this  additional instructions   latanoprost 0.005 % ophthalmic solution Commonly known as:  XALATAN Place 1 drop into both eyes at bedtime.   losartan 25 MG tablet Commonly known as:  COZAAR Take 12.5 mg by mouth daily.   multivitamin with minerals tablet Take 1 tablet by mouth daily.   potassium chloride 10 MEQ tablet Commonly known as:  K-DUR,KLOR-CON Take 1 tablet (10 mEq total) by mouth daily.   spironolactone 25 MG tablet Commonly known as:  ALDACTONE Take 0.5 tablets (12.5 mg total) by mouth daily. Start taking on:  03/11/2018       Discharge Instructions: Please refer to Patient Instructions section of EMR for full details.  Patient was counseled important signs and symptoms that should prompt return to medical care, changes in medications, dietary instructions, activity restrictions, and follow up appointments.   Follow-Up Appointments: Follow-up Information  Lelon Perla, MD. Call.   Specialty:  Cardiology Why:  Please call and make a hospital follow up appointment to be seen in 1 week Contact information: [place] [id number] [place], April A, Medical Student 03/10/2018, 2:41 PM  FPTS Upper-Level Resident Addendum  I have independently interviewed and examined the patient. I have discussed the above with the original author and agree with their documentation. Please see also any attending notes.   Bufford Lope, DO PGY-3, [facility] 03/10/2018 8:00 PM  [place] Service pager: [phone] (text pages welcome through [facility])

## 2018-03-10 NOTE — Progress Notes (Signed)
Family Medicine Teaching Service Daily Progress Note Intern Pager: [phone]  Patient name: Christopher Benton Medical record number: [id number] Date of birth: 12-Dec-1940 Age: 77 y.o. Gender: male  Primary Care Provider: System, Pcp Not In Consultants: Cardiology Code Status: Full  Pt Overview and Major Events to Date:  Pt overall stable, no acute events overnight.  Assessment and Plan: Bryam Taborda Benton a 77 y.o.malepresenting with a 6 month history of progressive shortness of breath in the setting of a large R. Pleural effusion admtitted for a HFrEF exacerbation. 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  Acute on Chronic HFrEF, improving. Pt remains hypervolemic on exam. O2 sats low of 90%, able to ambulate with appropriate oxygenation. Echo at [facility] 10/2017 with EF 35-40% and G3DD, also question of cardiac amyloid mentioned given bulleye pattern on strain imaging, moderate LVH, biatrial enlargement, and G3DD. Pt is appropriately responding to IV lasix, net negative 2L since admission, with 450cc over the last 24 hr. Wt 111.4 kg, 116.6 kg on admission (down 11.5lbs).  -Will increase to 40mg  IV lasix BID -Repeat echo pending per cards -Strict I/Os and daily weights -Will await cards recs regarding amyloid work-up  Afib/flutter, chronic and stable: Pt continues to maintain sinus rhythm. CHA2DS2-VASc score of 5. Denies any clinical symptoms of heart palpitations. K 4.0, Mg 2.0 on 10/10 -Check Mg level with PM BMP -Maintain K>4, Mg >2 - replete as needed -Replete K -Daily EKG to monitor for QTc per cards -Continue Eliquis 5mg  BID -Continue Tikosyn 562mcg daily  HTN: Pt has had lower SBPs in the 90s. He is currently on Coreg, spironolactone, losartan, and Lasix -holding am dose of Losartan -CTM vitals   Hyperbilirubinemia, chronic: Likely gilbert which is benign. Pt remains without clinical symptoms, no jaundice and  abdominal exam is unremarkable.  -no workup at this time -will CTM  Troponinemia, resolved: Trop trend flat at 0.1>0.11>0.12 with known CAD thought pt has remained asymptomatic without chest pain n/v/d. Likely secondary to demand ischemia in the setting of HFrEF exacerbation -will CTM for clinical signs of ischemia  FEN/GI:heart healthy Prophylaxis:Eliquis  Disposition: pending clinical improvement, likely home  Subjective:  Christopher Benton reports he is doing much better. Says SOB is improved. He was able to ambulate without difficulty and says he/s feeling closer to his baseline. He is tolerating PO liquids and solids well and voiding appropriately though he hasn't had a BM since admission and does report some abdominal gas with minimal pain. He has been taking Mirilax which helps. He also reports his congestion has improved a lot with the Mucinesx.   Objective: Temp:  [97.9 F (36.6 C)-98.6 F (37 C)] 98.6 F (37 C) (10/10 0759) Pulse Rate:  [60-68] 66 (10/10 0759) Resp:  [18-24] 20 (10/10 0759) BP: (83-105)/(53-71) 96/53 (10/10 0759) SpO2:  [91 %-95 %] 94 % (10/10 0759) Weight:  [111.4 kg] 111.4 kg (10/10 0610) Physical Exam: General: Well appearing, sitting up in bed eating breakfast Cardiovascular: Normal S1/S2, distant heart sounds, no murmurs rubs or gallops Respiratory: CTAB, diminished breath sounds in right base, improved from admission. No crackles or wheezes. No signs of respiratory distress. Abdomen: Soft, nondistended, nontender to palpation. No guarding.  Extremities: Able to move all extremities w/o difficulty Neuro: Alert and oriented, able to respond to commands. No gross focal deficits  Laboratory: Recent Labs  Lab 03/08/18 1156 03/09/18 0411  WBC 10.1 9.2  HGB 14.7 13.7  HCT 46.1 44.1  PLT 199 199   Recent Labs  Lab 03/08/18 1156 03/08/18 1311 03/09/18 0411 03/10/18 0425  NA 135  --  136 136  K 4.7  --  3.8 4.0  CL 100  --  101 103  CO2 22  --   24 25  BUN 19  --  19 16  CREATININE 1.37*  --  1.19 1.15  CALCIUM 9.6  --  9.1 9.5  PROT  --  7.3  --   --   BILITOT  --  3.0* 2.8*  --   ALKPHOS  --  75  --   --   ALT  --  17  --   --   AST  --  24  --   --   GLUCOSE 122*  --  89 92     Imaging/Diagnostic Tests: Echo: Pending  Terance Hart, April A, Medical Student 03/10/2018, 8:05 AM  FPTS Intern pager: [phone], text pages welcome  FPTS Upper-Level Resident Addendum  I have independently interviewed and examined the patient. I have discussed the above with the original author and agree with their documentation. My edits for correction/addition/clarification are in blue. Please see also any attending notes.   Bufford Lope, DO PGY-3, [facility] 03/10/2018 1:16 PM  FPTS Service pager: [phone] (text pages welcome through [facility])

## 2018-03-10 NOTE — Plan of Care (Signed)
  Problem: Education: Goal: Ability to verbalize understanding of medication therapies will improve Outcome: Progressing   Problem: Education: Goal: Ability to demonstrate management of disease process will improve Outcome: Progressing   Problem: Activity: Goal: Capacity to carry out activities will improve Outcome: Progressing   Problem: Nutrition: Goal: Adequate nutrition will be maintained Outcome: Progressing

## 2018-03-10 NOTE — Progress Notes (Signed)
Reviewed echo   Severe LVH  RV thick  LVEF is mildly depresses    Atria are severely dllated  Gr II diastolic dysfunction  Susp for amyloid  Recomm Will give 1 dose lasix today with K and Mg supplement Pt should have a pyrophosphate scan   If cannot be done today will arrange for outpt If feeling OK can go home today (if nuclear cannot be done even)   Will f/u as outpt,.  Dorris Carnes

## 2018-03-10 NOTE — Progress Notes (Addendum)
Progress Note  Patient Name: Christopher Benton Date of Encounter: 03/10/2018  Primary Cardiologist: Kirk Ruths, MD / [facility]  Subjective   Patient is breathing better   Able to walk hall yesteerday   Could not do that before   Inpatient Medications    Scheduled Meds: . apixaban  5 mg Oral BID  . carvedilol  25 mg Oral BID  . dextromethorphan-guaiFENesin  1 tablet Oral BID  . dofetilide  500 mcg Oral BID  . dorzolamide  1 drop Both Eyes BID  . latanoprost  1 drop Both Eyes QHS  . losartan  12.5 mg Oral Daily  . spironolactone  12.5 mg Oral Daily   Continuous Infusions:  PRN Meds: acetaminophen **OR** acetaminophen, polyethylene glycol   Vital Signs    Vitals:   03/10/18 0028 03/10/18 0610 03/10/18 0656 03/10/18 0759  BP: 101/67 (!) 83/59 101/71 (!) 96/53  Pulse: 60 67 66 66  Resp: 19 19  20   Temp: 97.9 F (36.6 C) 98.2 F (36.8 C)  98.6 F (37 C)  TempSrc: Oral Oral  Oral  SpO2: 95% 92% 93% 94%  Weight:  111.4 kg    Height:        Intake/Output Summary (Last 24 hours) at 03/10/2018 0801 Last data filed at 03/10/2018 0615 Gross per 24 hour  Intake 960 ml  Output 1419 ml  Net -459 ml   Filed Weights   03/08/18 1702 03/09/18 0545 03/10/18 0610  Weight: 113.9 kg 112 kg 111.4 kg    Telemetry    SR   Rates 40s to 60s   Personally Reviewed  ECG      Physical Exam   GEN: No acute distress.   Neck: No JVD Cardiac: RRR, no murmurs, rubs, or gallops.   Tr LE edema   Respiratory: Decreased BS at R base   Better than yesterday   . GI: Soft, nontender, non-distended  MS: No edema; No deformity. Neuro:  Nonfocal  Psych: Normal affect   Labs    Chemistry Recent Labs  Lab 03/08/18 1156 03/08/18 1311 03/09/18 0411 03/10/18 0425  NA 135  --  136 136  K 4.7  --  3.8 4.0  CL 100  --  101 103  CO2 22  --  24 25  GLUCOSE 122*  --  89 92  BUN 19  --  19 16  CREATININE 1.37*  --  1.19 1.15  CALCIUM 9.6  --  9.1 9.5  PROT  --  7.3  --    --   ALBUMIN  --  4.1  --   --   AST  --  24  --   --   ALT  --  17  --   --   ALKPHOS  --  75  --   --   BILITOT  --  3.0* 2.8*  --   GFRNONAA 48*  --  57* 60*  GFRAA 56*  --  >60 >60  ANIONGAP 13  --  11 8     Hematology Recent Labs  Lab 03/08/18 1156 03/09/18 0411  WBC 10.1 9.2  RBC 5.27 5.14  HGB 14.7 13.7  HCT 46.1 44.1  MCV 87.5 85.8  MCH 27.9 26.7  MCHC 31.9 31.1  RDW 13.4 13.2  PLT 199 199    Cardiac Enzymes Recent Labs  Lab 03/08/18 1311 03/08/18 1712 03/08/18 2142 03/09/18 0411  TROPONINI 0.10* 0.11* 0.11* 0.12*    Recent Labs  Lab 03/08/18 1200  TROPIPOC 0.07     BNP Recent Labs  Lab 03/08/18 1454  BNP 716.6*     DDimer No results for input(s): DDIMER in the last 168 hours.   Radiology    Dg Chest 2 View  Result Date: 03/09/2018 CLINICAL DATA:  Shortness of breath.  Atrial fibrillation. EXAM: CHEST - 2 VIEW COMPARISON:  April 17, 2016 FINDINGS: There is a sizable pleural effusion on the right. There is consolidation throughout much of the right middle and lower lobe. The left lung is clear. Heart is upper normal in size with pulmonary vascularity normal. No adenopathy. There is aortic atherosclerosis. There is degenerative change in the thoracic spine. IMPRESSION: Consolidation throughout the right middle and lower lobes with right pleural effusion. It should be noted that a lesion involving the right bronchus intermedius could account for these findings. Given this appearance, correlation with contrast enhanced chest CT may well be advisable. Left lung clear. Heart upper normal in size. There is aortic atherosclerosis. Aortic Atherosclerosis (ICD10-I70.0). Electronically Signed   By: Lowella Grip III M.D.   On: 03/09/2018 11:31   Dg Chest 2 View  Result Date: 03/08/2018 CLINICAL DATA:  Shortness of breath and weakness. EXAM: CHEST - 2 VIEW COMPARISON:  04/17/2016 FINDINGS: Enlarged cardiac silhouette. Calcific atherosclerotic disease and  tortuosity of the aorta. No evidence of pneumothorax. Moderate to large right pleural effusion. Airspace disease in the right lower lobe cannot be excluded. Osseous structures are without acute abnormality. Soft tissues are grossly normal. IMPRESSION: Moderate to large right pleural effusion. The density in the superior segment of the right lower lobe may represent airspace disease or potentially pulmonary nodule. Enlarged cardiac silhouette. Calcific atherosclerotic disease and tortuosity of the aorta. Electronically Signed   By: Fidela Salisbury M.D.   On: 03/08/2018 12:32   Ct Angio Chest Pe W And/or Wo Contrast  Result Date: 03/08/2018 CLINICAL DATA:  Shortness of breath. EXAM: CT ANGIOGRAPHY CHEST WITH CONTRAST TECHNIQUE: Multidetector CT imaging of the chest was performed using the standard protocol during bolus administration of intravenous contrast. Multiplanar CT image reconstructions and MIPs were obtained to evaluate the vascular anatomy. CONTRAST:  80 mL ISOVUE-370 IOPAMIDOL (ISOVUE-370) INJECTION 76% COMPARISON:  Radiographs of same day.  CT scan of April 12, 2016. FINDINGS: Cardiovascular: Atherosclerosis of thoracic aorta is noted without aneurysm formation. Mild cardiomegaly is noted. No pericardial effusion is noted. Mediastinum/Nodes: No enlarged mediastinal, hilar, or axillary lymph nodes. Thyroid gland, trachea, and esophagus demonstrate no significant findings. Lungs/Pleura: No pneumothorax is noted. Large right pleural effusion is noted with complete atelectasis of the right lower lobe and mild atelectasis of the right upper and middle lobes. Left lung is clear. Upper Abdomen: Stable left hepatic cyst is noted. No other abnormality seen in visualized portion of upper abdomen. Musculoskeletal: No significant osseous abnormality is noted. 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 Review of the MIP images  confirms the above findings. IMPRESSION: Large right pleural effusion is noted with complete atelectasis of the right lower lobe and mild subsegmental atelectasis of the right upper and middle lobes. 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 Aortic Atherosclerosis (ICD10-I70.0). Electronically Signed   By: Marijo Conception, M.D.   On: 03/08/2018 16:00    Cardiac Studies   Echo just finished   Patient Profile     77 y.o. male  with a PMH of CAD on CT scans, chronic systolic CHF, atrial fibrillation/flutter on tikosyn, HTN, who is being seen today for the evaluation of CHF and atrial fibrillation at the request of Dr. Gwendlyn Deutscher.   Assessment & Plan    1  Acute on chronic systolicCHF   Improved since admit, even from yesteraday PM Most likely exacerbation due to poor diet choices    Pt knows    Echo has been done   I will review and compare to previous echo  Suspicous for amyloid with low EKG voltage and LVH Keep on same meds   Followed at the [facility]  2  Troponin   Trivial elevation   No symptom to sugg ischemia  3   Atrial fib   On Tikosyn   Remains in SR   HR low at time (middle of night)  4  HTN   Follow  Cut back on coreg with marginal BP and lower HR to 12.5 bid  For questions or updates, please contact [facility] Please consult [email or web address] for contact info under        Signed, Dorris Carnes, MD  03/10/2018, 8:01 AM

## 2018-03-10 NOTE — Progress Notes (Signed)
  Echocardiogram 2D Echocardiogram has been performed.  Christopher Benton 03/10/2018, 10:29 AM

## 2018-03-14 IMAGING — US US THORACENTESIS ASP PLEURAL SPACE W/IMG GUIDE
1 series · 4 of 4 positions shown · non-contrast
Comparison: none

INDICATION: Shortness of breath. Right-sided pleural effusion. Request
thoracentesis.

[Series 1: us thoracentesis asp pleural space w/img guide · 0.30mm/px · 4 of 4 slices shown]
[im 1/4]
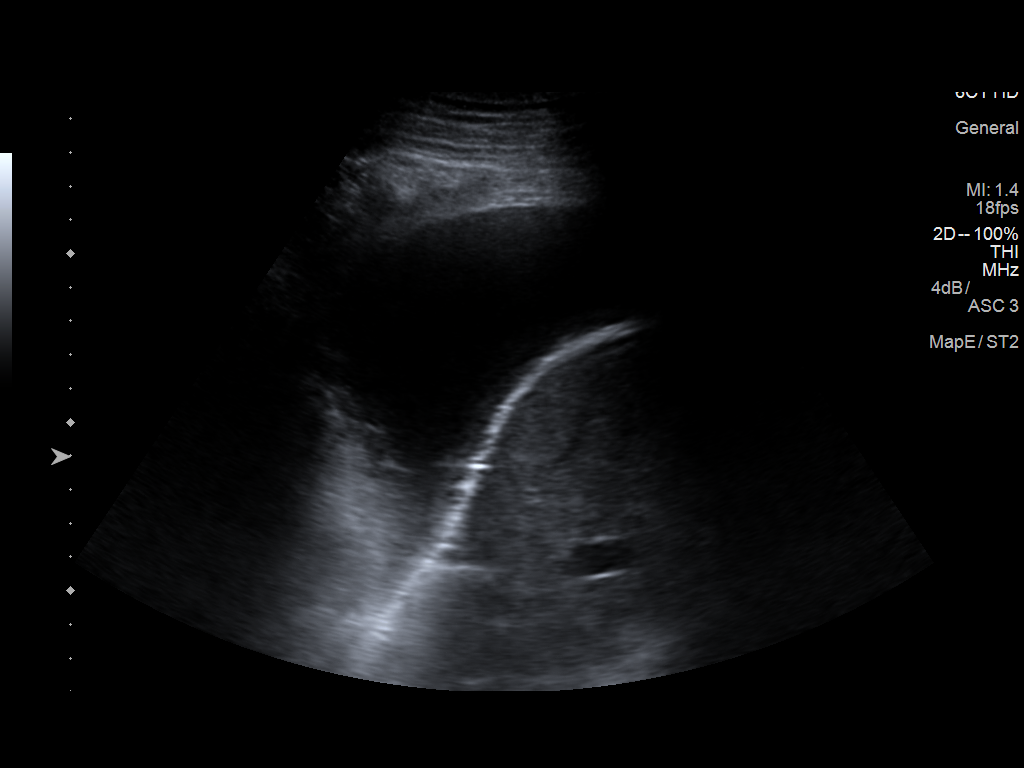
[im 2/4]
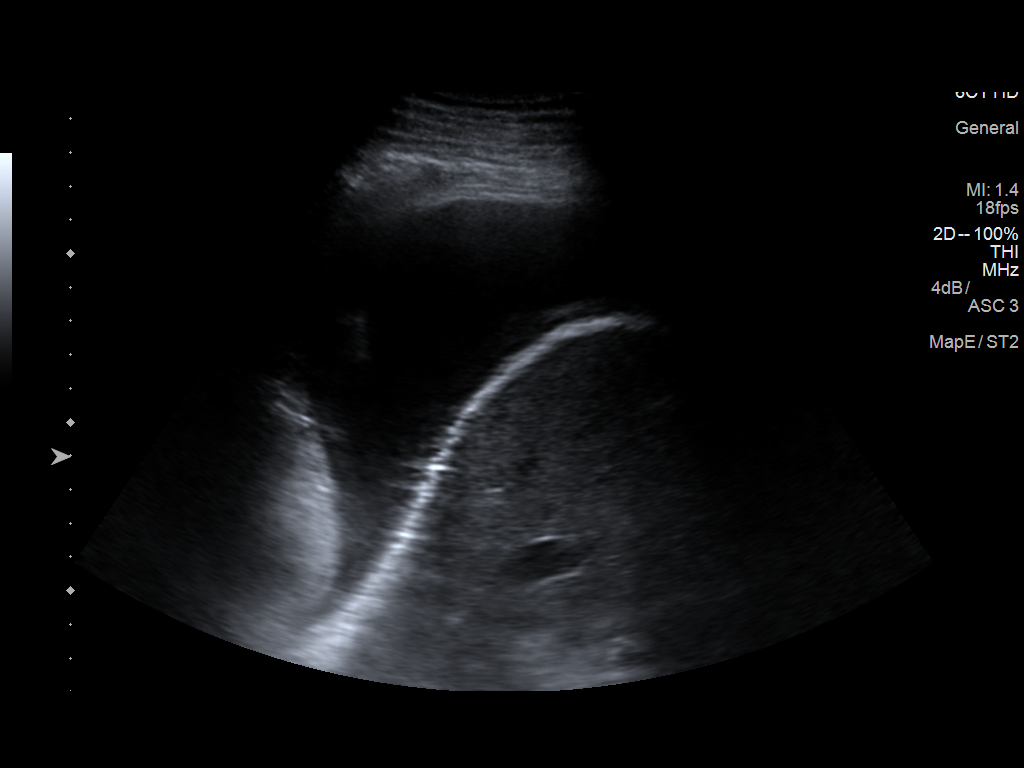
[im 3/4]
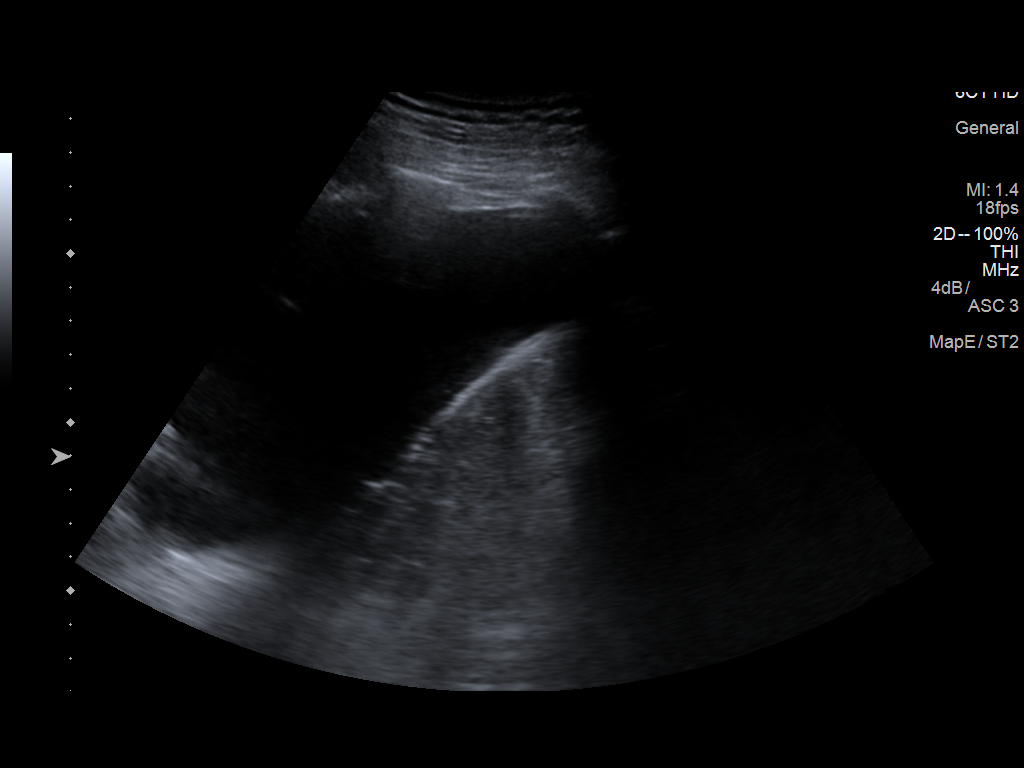
[im 4/4]
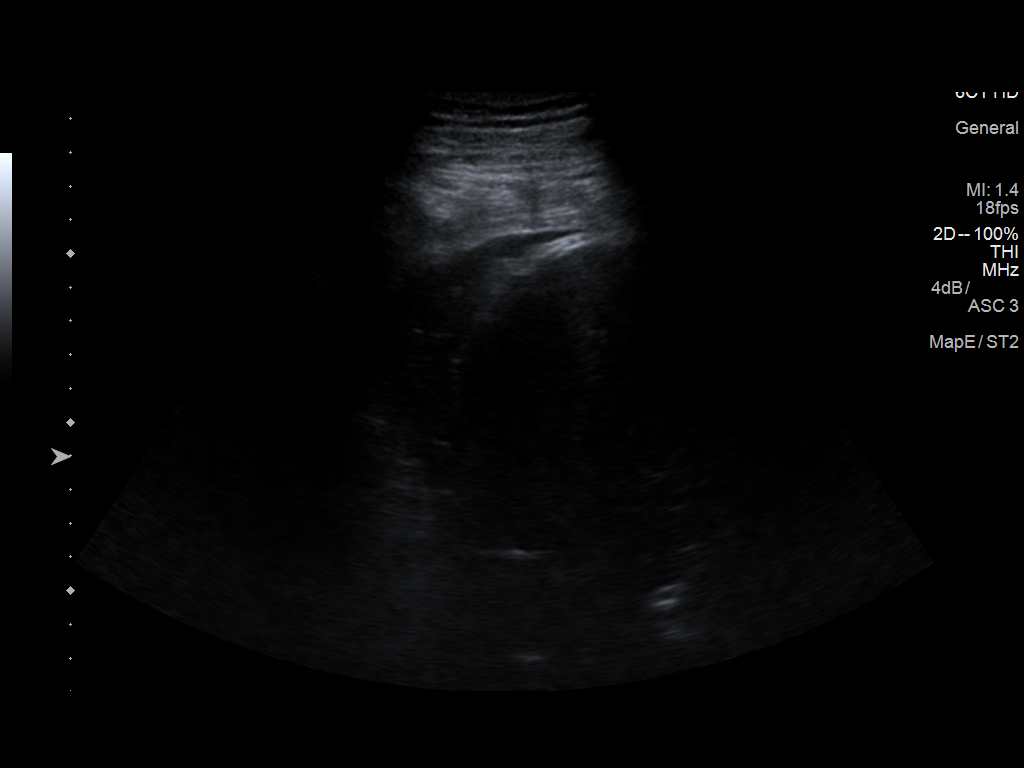

[4 of 4 positions shown; findings below may reference images not displayed]

EXAM:
ULTRASOUND GUIDED RIGHT THORACENTESIS

MEDICATIONS:
None.

COMPLICATIONS:
None immediate. Postprocedural chest x-ray negative for
pneumothorax.

PROCEDURE:
An ultrasound guided thoracentesis was thoroughly discussed with the
patient and questions answered. The benefits, risks, alternatives
and complications were also discussed. The patient understands and
wishes to proceed with the procedure. Written consent was obtained.

Ultrasound was performed to localize and mark an adequate pocket of
fluid in the right chest. The area was then prepped and draped in
the normal sterile fashion. 1% Lidocaine was used for local
anesthesia. Under ultrasound guidance a Safe-T-Centesis catheter was
introduced. Thoracentesis was performed. The catheter was removed
and a dressing applied.
FINDINGS: A total of approximately 1.3 L of hazy, yellow fluid was removed.
Samples were sent to the laboratory as requested by the clinical
team.
IMPRESSION: Successful ultrasound guided right thoracentesis yielding 1.3 L of
pleural fluid.

## 2018-03-22 NOTE — Progress Notes (Addendum)
HPI: FU atrial fibrillation/atrial flutter. Also with previous CT showing coronary calcification. First diagnosed with atrial arrhythmias 2/17. Had TEE/DCCV. Had stress test 8/17 at [facility] that showed EF 41 and no ischemia. Pt admitted 11/17 with dyspnea and right pleural effusion. Had thoracentesis and cytology negative; diuresed. TEE 11/17 showed EF 35-40, biatrial enlargement and moderate TR. Patient admitted to [facility] October 2019 with progressive dyspnea.  He was found to have a large right pleural effusion.  Patient was diuresed. Echocardiogram October 2019 showed ejection fraction 45 to 50%; severe left ventricular hypertrophy and amyloid should be considered; moderate diastolic dysfunction; mildly dilated ascending aorta, biatrial enlargement and mild tricuspid regurgitation.  CTA October 2019 showed large right pleural effusion.  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  Since last seen,  patient has dyspnea with more vigorous activities but not routine activities.  There is no orthopnea, PND or pedal edema.  He is not having chest pain.  He is on Lasix as needed but has not needed this since he was hospitalized.  Current Outpatient Medications  Medication Sig Dispense Refill  . apixaban (ELIQUIS) 5 MG TABS tablet Take 1 tablet (5 mg total) by mouth 2 (two) times daily. 60 tablet 6  . carvedilol (COREG) 12.5 MG tablet Take 1 tablet (12.5 mg total) by mouth 2 (two) times daily. 60 tablet 0  . dofetilide (TIKOSYN) 500 MCG capsule Take 1 capsule (500 mcg total) by mouth 2 (two) times daily. 60 capsule 3  . dorzolamide (TRUSOPT) 2 % ophthalmic solution Place 1 drop into both eyes 2 (two) times daily.    . furosemide (LASIX) 40 MG tablet Take 2 tablets (80 mg total) by mouth daily as needed for fluid (for notice weight gain of >3 lbs in 24-48 hours and notify your doctor.). 30 tablet 0  . latanoprost (XALATAN) 0.005 % ophthalmic solution Place 1 drop  into both eyes at bedtime.    Marland Kitchen losartan (COZAAR) 25 MG tablet Take 12.5 mg by mouth daily.     . Multiple Vitamins-Minerals (MULTIVITAMIN WITH MINERALS) tablet Take 1 tablet by mouth daily.    . potassium chloride (K-DUR,KLOR-CON) 10 MEQ tablet Take 1 tablet (10 mEq total) by mouth daily. 30 tablet 6  . spironolactone (ALDACTONE) 25 MG tablet Take 0.5 tablets (12.5 mg total) by mouth daily. 30 tablet 0   No current facility-administered medications for this visit.      Past Medical History:  Diagnosis Date  . Atrial fibrillation ([place])   . CAD (coronary artery disease)   . Cardiomyopathy ([place])   . Chronic systolic CHF (congestive heart failure) ([place]) 04/12/2016  . Dyspnea   . Dysrhythmia Atrial flutter  . Hypertension     Past Surgical History:  Procedure Laterality Date  . CARDIOVERSION N/A 09/04/2015   Procedure: CARDIOVERSION;  Surgeon: Skeet Latch, MD;  Location: [place];  Service: Cardiovascular;  Laterality: N/A;  . TEE WITHOUT CARDIOVERSION N/A 09/04/2015   Procedure: TRANSESOPHAGEAL ECHOCARDIOGRAM (TEE);  Surgeon: Skeet Latch, MD;  Location: [place];  Service: Cardiovascular;  Laterality: N/A;  . TEE WITHOUT CARDIOVERSION N/A 04/15/2016   Procedure: TRANSESOPHAGEAL ECHOCARDIOGRAM (TEE);  Surgeon: Jerline Pain, MD;  Location: [facility] ENDOSCOPY;  Service: Cardiovascular;  Laterality: N/A;  . TOE AMPUTATION      Social History   Socioeconomic History  . Marital status: Married    Spouse name: Not on file  . Number of children: 2  . Years of education:  Not on file  . Highest education level: Not on file  Occupational History  . Not on file  Social Needs  . Financial resource strain: Not on file  . Food insecurity:    Worry: Not on file    Inability: Not on file  . Transportation needs:    Medical: Not on file    Non-medical: Not on file  Tobacco Use  . Smoking status: Former Research scientist (life sciences)  . Smokeless tobacco: Never Used  Substance and Sexual Activity  .  Alcohol use: Yes    Alcohol/week: 0.0 standard drinks    Comment: Occasional  . Drug use: No  . Sexual activity: Not on file  Lifestyle  . Physical activity:    Days per week: Not on file    Minutes per session: Not on file  . Stress: Not on file  Relationships  . Social connections:    Talks on phone: Not on file    Gets together: Not on file    Attends religious service: Not on file    Active member of club or organization: Not on file    Attends meetings of clubs or organizations: Not on file    Relationship status: Not on file  . Intimate partner violence:    Fear of current or ex partner: Not on file    Emotionally abused: Not on file    Physically abused: Not on file    Forced sexual activity: Not on file  Other Topics Concern  . Not on file  Social History Narrative  . Not on file    Family History  Problem Relation Age of Onset  . Heart disease Unknown        No family history    ROS: no fevers or chills, productive cough, hemoptysis, dysphasia, odynophagia, melena, hematochezia, dysuria, hematuria, rash, seizure activity, orthopnea, PND, pedal edema, claudication. Remaining systems are negative.  Physical Exam: Well-developed well-nourished in no acute distress.  Skin is warm and dry.  HEENT is normal.  Neck is supple.  Chest - diminished BS RLL Cardiovascular exam is regular rate and rhythm.  Abdominal exam nontender or distended. No masses palpated. Extremities show no edema. 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  I will refer to amyloid clinic.  We will check myeloma panel.  2 chronic combined systolic/diastolic congestive heart failure-patient appears to be euvolemic this morning.  Continue present dose of diuretic.  We discussed the importance of fluid restriction and low-sodium diet.  3 paroxysmal atrial fibrillation-he is in sinus rhythm today on examination.  We will continue with  beta-blocker for rate control if atrial fibrillation recurs. Continue tikosyn. Continue apixaban.  4 hypertension-patient's blood pressure is controlled this morning.  Continue present medications and follow.  5 coronary artery disease-based on previous CT showing coronary calcification.  We will continue statin.  No aspirin given need for anticoagulation.  6 mildly reduced LV function-continue ARB and coreg.  Kirk Ruths, MD

## 2018-03-23 ENCOUNTER — Encounter: Payer: Self-pay | Admitting: Cardiology

## 2018-03-23 ENCOUNTER — Ambulatory Visit (INDEPENDENT_AMBULATORY_CARE_PROVIDER_SITE_OTHER): Payer: No Typology Code available for payment source | Admitting: Cardiology

## 2018-03-23 ENCOUNTER — Other Ambulatory Visit: Payer: Self-pay | Admitting: *Deleted

## 2018-03-23 VITALS — BP 100/68 | HR 60 | Ht 71.5 in | Wt 246.1 lb

## 2018-03-23 DIAGNOSIS — E859 Amyloidosis, unspecified: Secondary | ICD-10-CM

## 2018-03-23 DIAGNOSIS — I480 Paroxysmal atrial fibrillation: Secondary | ICD-10-CM

## 2018-03-23 DIAGNOSIS — I10 Essential (primary) hypertension: Secondary | ICD-10-CM

## 2018-03-23 DIAGNOSIS — E854 Organ-limited amyloidosis: Secondary | ICD-10-CM | POA: Diagnosis not present

## 2018-03-23 DIAGNOSIS — I43 Cardiomyopathy in diseases classified elsewhere: Principal | ICD-10-CM

## 2018-03-23 NOTE — Progress Notes (Signed)
MYLOMA PANEL

## 2018-03-23 NOTE — Patient Instructions (Signed)
Medication Instructions:   NO CHANGE  Follow-Up:  Your physician recommends that you schedule a follow-up appointment in: [place] TO DR DAN BENSIMHON

## 2018-04-19 ENCOUNTER — Telehealth (HOSPITAL_COMMUNITY): Payer: Self-pay | Admitting: Vascular Surgery

## 2018-04-19 NOTE — Telephone Encounter (Signed)
Left pt message to make NEW CHF APPT next ava

## 2018-05-31 ENCOUNTER — Encounter: Payer: Self-pay | Admitting: Cardiology

## 2018-05-31 ENCOUNTER — Inpatient Hospital Stay (HOSPITAL_COMMUNITY)
Admission: EM | Admit: 2018-05-31 | Discharge: 2018-06-07 | DRG: 291 | Disposition: A | Discharge status: Home / Self Care | Payer: No Typology Code available for payment source | Attending: Cardiology | Admitting: Cardiology

## 2018-05-31 ENCOUNTER — Inpatient Hospital Stay
Admission: AD | Admit: 2018-05-31 | Payer: No Typology Code available for payment source | Source: Ambulatory Visit | Admitting: Cardiology

## 2018-05-31 ENCOUNTER — Encounter (INDEPENDENT_AMBULATORY_CARE_PROVIDER_SITE_OTHER): Payer: Self-pay

## 2018-05-31 ENCOUNTER — Ambulatory Visit (INDEPENDENT_AMBULATORY_CARE_PROVIDER_SITE_OTHER): Payer: No Typology Code available for payment source | Admitting: Cardiology

## 2018-05-31 ENCOUNTER — Other Ambulatory Visit: Payer: Self-pay | Admitting: Cardiology

## 2018-05-31 ENCOUNTER — Telehealth: Payer: Self-pay | Admitting: *Deleted

## 2018-05-31 ENCOUNTER — Encounter (HOSPITAL_COMMUNITY): Payer: Self-pay

## 2018-05-31 ENCOUNTER — Emergency Department (HOSPITAL_COMMUNITY): Payer: No Typology Code available for payment source

## 2018-05-31 VITALS — BP 130/82 | Ht 71.5 in | Wt 253.2 lb

## 2018-05-31 DIAGNOSIS — N179 Acute kidney failure, unspecified: Secondary | ICD-10-CM | POA: Diagnosis present

## 2018-05-31 DIAGNOSIS — I43 Cardiomyopathy in diseases classified elsewhere: Secondary | ICD-10-CM | POA: Diagnosis present

## 2018-05-31 DIAGNOSIS — I480 Paroxysmal atrial fibrillation: Secondary | ICD-10-CM | POA: Diagnosis present

## 2018-05-31 DIAGNOSIS — E859 Amyloidosis, unspecified: Secondary | ICD-10-CM

## 2018-05-31 DIAGNOSIS — I5043 Acute on chronic combined systolic (congestive) and diastolic (congestive) heart failure: Secondary | ICD-10-CM | POA: Diagnosis present

## 2018-05-31 DIAGNOSIS — J90 Pleural effusion, not elsewhere classified: Secondary | ICD-10-CM | POA: Diagnosis not present

## 2018-05-31 DIAGNOSIS — N183 Chronic kidney disease, stage 3 (moderate): Secondary | ICD-10-CM | POA: Diagnosis present

## 2018-05-31 DIAGNOSIS — Z87891 Personal history of nicotine dependence: Secondary | ICD-10-CM | POA: Diagnosis not present

## 2018-05-31 DIAGNOSIS — I4819 Other persistent atrial fibrillation: Secondary | ICD-10-CM | POA: Diagnosis present

## 2018-05-31 DIAGNOSIS — Z79899 Other long term (current) drug therapy: Secondary | ICD-10-CM | POA: Diagnosis not present

## 2018-05-31 DIAGNOSIS — I484 Atypical atrial flutter: Secondary | ICD-10-CM | POA: Diagnosis present

## 2018-05-31 DIAGNOSIS — I10 Essential (primary) hypertension: Secondary | ICD-10-CM

## 2018-05-31 DIAGNOSIS — Z7901 Long term (current) use of anticoagulants: Secondary | ICD-10-CM | POA: Diagnosis not present

## 2018-05-31 DIAGNOSIS — I2510 Atherosclerotic heart disease of native coronary artery without angina pectoris: Secondary | ICD-10-CM | POA: Diagnosis present

## 2018-05-31 DIAGNOSIS — E8582 Wild-type transthyretin-related (ATTR) amyloidosis: Secondary | ICD-10-CM | POA: Diagnosis present

## 2018-05-31 DIAGNOSIS — I130 Hypertensive heart and chronic kidney disease with heart failure and stage 1 through stage 4 chronic kidney disease, or unspecified chronic kidney disease: Secondary | ICD-10-CM | POA: Diagnosis present

## 2018-05-31 DIAGNOSIS — I5033 Acute on chronic diastolic (congestive) heart failure: Secondary | ICD-10-CM | POA: Diagnosis not present

## 2018-05-31 DIAGNOSIS — I5023 Acute on chronic systolic (congestive) heart failure: Secondary | ICD-10-CM

## 2018-05-31 DIAGNOSIS — Z89429 Acquired absence of other toe(s), unspecified side: Secondary | ICD-10-CM | POA: Diagnosis not present

## 2018-05-31 DIAGNOSIS — Z9889 Other specified postprocedural states: Secondary | ICD-10-CM

## 2018-05-31 DIAGNOSIS — J811 Chronic pulmonary edema: Secondary | ICD-10-CM | POA: Diagnosis present

## 2018-05-31 DIAGNOSIS — J918 Pleural effusion in other conditions classified elsewhere: Secondary | ICD-10-CM | POA: Diagnosis present

## 2018-05-31 DIAGNOSIS — Z89421 Acquired absence of other right toe(s): Secondary | ICD-10-CM | POA: Diagnosis not present

## 2018-05-31 DIAGNOSIS — E854 Organ-limited amyloidosis: Secondary | ICD-10-CM | POA: Diagnosis present

## 2018-05-31 DIAGNOSIS — I509 Heart failure, unspecified: Secondary | ICD-10-CM

## 2018-05-31 DIAGNOSIS — M19072 Primary osteoarthritis, left ankle and foot: Secondary | ICD-10-CM | POA: Diagnosis not present

## 2018-05-31 DIAGNOSIS — Z888 Allergy status to other drugs, medicaments and biological substances status: Secondary | ICD-10-CM

## 2018-05-31 HISTORY — DX: Unspecified osteoarthritis, unspecified site: M19.90

## 2018-05-31 HISTORY — DX: Inflammatory liver disease, unspecified: K75.9

## 2018-05-31 LAB — COMPREHENSIVE METABOLIC PANEL
ALT: 15 U/L (ref 0–44)
AST: 27 U/L (ref 15–41)
Albumin: 4.7 g/dL (ref 3.5–5.0)
Alkaline Phosphatase: 72 U/L (ref 38–126)
Anion gap: 11 (ref 5–15)
BUN: 15 mg/dL (ref 8–23)
CALCIUM: 9.6 mg/dL (ref 8.9–10.3)
CO2: 24 mmol/L (ref 22–32)
Chloride: 100 mmol/L (ref 98–111)
Creatinine, Ser: 1.13 mg/dL (ref 0.61–1.24)
GFR calc Af Amer: >60 mL/min (ref >60)
GFR calc non Af Amer: >60 mL/min (ref >60)
Glucose, Bld: 113 mg/dL — ABNORMAL HIGH (ref 70–99)
Potassium: 4.5 mmol/L (ref 3.5–5.1)
Sodium: 135 mmol/L (ref 135–145)
Total Bilirubin: 3.9 mg/dL — ABNORMAL HIGH (ref 0.3–1.2)
Total Protein: 8.1 g/dL (ref 6.5–8.1)

## 2018-05-31 LAB — CBC WITH DIFFERENTIAL/PLATELET
Abs Immature Granulocytes: 0.06 10*3/uL (ref 0.00–0.07)
Basophils Absolute: 0.1 10*3/uL (ref 0.0–0.1)
Basophils Relative: 1 %
EOS PCT: 1 %
Eosinophils Absolute: 0.1 10*3/uL (ref 0.0–0.5)
HCT: 51 % (ref 39.0–52.0)
HEMOGLOBIN: 16.1 g/dL (ref 13.0–17.0)
Immature Granulocytes: 1 %
Lymphocytes Relative: 22 %
Lymphs Abs: 2.1 10*3/uL (ref 0.7–4.0)
MCH: 28.3 pg (ref 26.0–34.0)
MCHC: 31.6 g/dL (ref 30.0–36.0)
MCV: 89.8 fL (ref 80.0–100.0)
Monocytes Absolute: 0.9 10*3/uL (ref 0.1–1.0)
Monocytes Relative: 10 %
Neutro Abs: 6.4 10*3/uL (ref 1.7–7.7)
Neutrophils Relative %: 65 %
Platelets: 202 10*3/uL (ref 150–400)
RBC: 5.68 MIL/uL (ref 4.22–5.81)
RDW: 13.4 % (ref 11.5–15.5)
WBC: 9.7 10*3/uL (ref 4.0–10.5)
nRBC: 0 % (ref 0.0–0.2)

## 2018-05-31 LAB — BRAIN NATRIURETIC PEPTIDE: B Natriuretic Peptide: 502.8 pg/mL — ABNORMAL HIGH (ref 0.0–100.0)

## 2018-05-31 LAB — TROPONIN I: Troponin I: 0.09 ng/mL (ref <0.03)

## 2018-05-31 MED ORDER — NITROGLYCERIN 0.4 MG SL SUBL: 0.4000 mg | SUBLINGUAL_TABLET | SUBLINGUAL | Status: DC | PRN

## 2018-05-31 MED ORDER — ONDANSETRON HCL 4 MG/2ML IJ SOLN
4.0000 mg | Freq: Four times a day (QID) | INTRAMUSCULAR | Status: DC | PRN
  Administered 2018-06-01: 4 mg via INTRAVENOUS
  Filled 2018-05-31: qty 2

## 2018-05-31 MED ORDER — POTASSIUM CHLORIDE CRYS ER 10 MEQ PO TBCR
10.0000 meq | EXTENDED_RELEASE_TABLET | Freq: Two times a day (BID) | ORAL | Status: DC
  Administered 2018-06-01 – 2018-06-07 (×13): 10 meq via ORAL
  Filled 2018-05-31 (×14): qty 1

## 2018-05-31 MED ORDER — FUROSEMIDE 10 MG/ML IJ SOLN
80.0000 mg | Freq: Two times a day (BID) | INTRAMUSCULAR | Status: DC
  Administered 2018-05-31 – 2018-06-02 (×4): 80 mg via INTRAVENOUS
  Filled 2018-05-31 (×4): qty 8

## 2018-05-31 MED ORDER — SODIUM CHLORIDE 0.9 % IV SOLN: 250.0000 mL | INTRAVENOUS | Status: DC | PRN

## 2018-05-31 MED ORDER — SODIUM CHLORIDE 0.9% FLUSH
3.0000 mL | Freq: Two times a day (BID) | INTRAVENOUS | Status: DC
  Administered 2018-05-31 – 2018-06-07 (×14): 3 mL via INTRAVENOUS

## 2018-05-31 MED ORDER — ZOLPIDEM TARTRATE 5 MG PO TABS: 5.0000 mg | ORAL_TABLET | Freq: Every evening | ORAL | Status: DC | PRN

## 2018-05-31 MED ORDER — APIXABAN 5 MG PO TABS
5.0000 mg | ORAL_TABLET | Freq: Two times a day (BID) | ORAL | Status: DC
  Administered 2018-05-31: 5 mg via ORAL
  Filled 2018-05-31 (×2): qty 1

## 2018-05-31 MED ORDER — DORZOLAMIDE HCL 2 % OP SOLN
1.0000 [drp] | Freq: Two times a day (BID) | OPHTHALMIC | Status: DC
  Administered 2018-05-31 – 2018-06-07 (×14): 1 [drp] via OPHTHALMIC
  Filled 2018-05-31: qty 10

## 2018-05-31 MED ORDER — LATANOPROST 0.005 % OP SOLN
1.0000 [drp] | Freq: Every day | OPHTHALMIC | Status: DC
  Administered 2018-05-31 – 2018-06-06 (×7): 1 [drp] via OPHTHALMIC
  Filled 2018-05-31: qty 2.5

## 2018-05-31 MED ORDER — ACETAMINOPHEN 325 MG PO TABS
650.0000 mg | ORAL_TABLET | ORAL | Status: DC | PRN
  Administered 2018-06-01 – 2018-06-03 (×2): 650 mg via ORAL
  Filled 2018-05-31 (×2): qty 2

## 2018-05-31 MED ORDER — CARVEDILOL 12.5 MG PO TABS
12.5000 mg | ORAL_TABLET | Freq: Two times a day (BID) | ORAL | Status: DC
  Administered 2018-06-01 – 2018-06-04 (×6): 12.5 mg via ORAL
  Filled 2018-05-31 (×10): qty 1

## 2018-05-31 MED ORDER — LOSARTAN POTASSIUM 25 MG PO TABS
12.5000 mg | ORAL_TABLET | Freq: Every day | ORAL | Status: DC
  Filled 2018-05-31 (×2): qty 0.5

## 2018-05-31 MED ORDER — FUROSEMIDE 10 MG/ML IJ SOLN
80.0000 mg | Freq: Once | INTRAMUSCULAR | Status: AC
  Administered 2018-05-31: 80 mg via INTRAVENOUS
  Filled 2018-05-31: qty 8

## 2018-05-31 MED ORDER — TAFAMIDIS 61 MG PO CAPS
61.0000 mg | ORAL_CAPSULE | Freq: Every day | ORAL | Status: DC
  Administered 2018-06-01 – 2018-06-07 (×7): 61 mg via ORAL
  Filled 2018-05-31: qty 1

## 2018-05-31 MED ORDER — ASPIRIN EC 81 MG PO TBEC: 81.0000 mg | DELAYED_RELEASE_TABLET | Freq: Every day | ORAL | Status: DC

## 2018-05-31 MED ORDER — DOFETILIDE 500 MCG PO CAPS
500.0000 ug | ORAL_CAPSULE | Freq: Two times a day (BID) | ORAL | Status: DC
  Administered 2018-05-31 – 2018-06-04 (×8): 500 ug via ORAL
  Filled 2018-05-31 (×4): qty 1
  Filled 2018-05-31 (×3): qty 2
  Filled 2018-05-31 (×2): qty 1

## 2018-05-31 MED ORDER — SPIRONOLACTONE 12.5 MG HALF TABLET
12.5000 mg | ORAL_TABLET | Freq: Every day | ORAL | Status: DC
  Administered 2018-06-01 – 2018-06-07 (×7): 12.5 mg via ORAL
  Filled 2018-05-31 (×7): qty 1

## 2018-05-31 MED ORDER — ALPRAZOLAM 0.25 MG PO TABS: 0.2500 mg | ORAL_TABLET | Freq: Two times a day (BID) | ORAL | Status: DC | PRN

## 2018-05-31 NOTE — ED Notes (Signed)
Pt refused the Losartan stating that he no longer takes it anymore.

## 2018-05-31 NOTE — Assessment & Plan Note (Signed)
Controlled.  

## 2018-05-31 NOTE — ED Notes (Signed)
Family updated that [facility] was coming to talk to them.

## 2018-05-31 NOTE — ED Notes (Signed)
Date and time results received: 05/31/18 2:45 PM  (use smartphrase ".now" to insert current time)  Test: Troponin Critical Value: 0.09  Name of Provider Notified: [place]  Orders Received? Or Actions Taken?: None at this time

## 2018-05-31 NOTE — H&P (Addendum)
Cardiology Admission History and Physical:   Patient ID: Christopher Benton MRN: [id number]; DOB: 1940-07-22   Admission date: (Not on file)  Primary Care Provider: [name], MD Primary Cardiologist: Christopher Ruths, MD  Primary Electrophysiologist:  None   Chief Complaint:  SOB  Patient Profile:   Christopher Benton is a 77 y.o. male with cardiomyopathy seen in the office with increased SOB.  History of Present Illness:   Christopher Benton is a 77 year old male followed by Christopher Benton with a history of congestive heart failure, recurrent right pleural effusion, PAF, and suspected amyloidosis.  Echocardiogram in the past that showed preserved LV function with severe LVH.  He has been seen at the [place] and started on Tamfamidiis 61 mg daily.  He has had prior right pleural effusion that was tapped in 2017 and again in October 2019. He was admitted and diuresed in October. He is in the office today after he called this morning complaining of increasing shortness of breath over the last couple weeks.  He is also had increasing edema.  His weight is up 7 pounds.  He was encouraged to go to the emergency room when he called but he wanted to come to the office.  He does have orthopnea.   Past Medical History:  Diagnosis Date  . Atrial fibrillation ([place])   . CAD (coronary artery disease)   . Cardiomyopathy ([place])   . Chronic systolic CHF (congestive heart failure) ([place]) 04/12/2016  . Dyspnea   . Dysrhythmia Atrial flutter  . Hypertension     Past Surgical History:  Procedure Laterality Date  . CARDIOVERSION N/A 09/04/2015   Procedure: CARDIOVERSION;  Surgeon: Christopher Latch, MD;  Location: [place];  Service: Cardiovascular;  Laterality: N/A;  . TEE WITHOUT CARDIOVERSION N/A 09/04/2015   Procedure: TRANSESOPHAGEAL ECHOCARDIOGRAM (TEE);  Surgeon: Christopher Latch, MD;  Location: [place];  Service: Cardiovascular;  Laterality: N/A;  . TEE WITHOUT CARDIOVERSION N/A 04/15/2016   Procedure: TRANSESOPHAGEAL ECHOCARDIOGRAM (TEE);  Surgeon: Christopher Pain, MD;  Location[facility] ENDOSCOPY;  Service: Cardiovascular;  Laterality: N/A;  . TOE AMPUTATION       Medications Prior to Admission: Prior to Admission medications   Medication Sig Start Date End Date Taking? Authorizing Provider  apixaban (ELIQUIS) 5 MG TABS tablet Take 1 tablet (5 mg total) by mouth 2 (two) times daily. 08/28/15   Christopher Perla, MD  carvedilol (COREG) 12.5 MG tablet Take 1 tablet (12.5 mg total) by mouth 2 (two) times daily. 03/10/18   Christopher Lope, DO  dofetilide (TIKOSYN) 500 MCG capsule Take 1 capsule (500 mcg total) by mouth 2 (two) times daily. 04/28/16   Christopher Needs, NP  dorzolamide (TRUSOPT) 2 % ophthalmic solution Place 1 drop into both eyes 2 (two) times daily.    [provider]  furosemide (LASIX) 40 MG tablet Take 2 tablets (80 mg total) by mouth daily as needed for fluid (for notice weight gain of >3 lbs in 24-48 hours and notify your doctor.). 03/10/18   Christopher Lope, DO  latanoprost (XALATAN) 0.005 % ophthalmic solution Place 1 drop into both eyes at bedtime.    [provider]  losartan (COZAAR) 25 MG tablet Take 12.5 mg by mouth daily.     [provider]  Multiple Vitamins-Minerals (MULTIVITAMIN WITH MINERALS) tablet Take 1 tablet by mouth daily.    [provider]  potassium chloride (K-DUR,KLOR-CON) 10 MEQ tablet Take 1 tablet (10 mEq total) by mouth daily. 04/29/16  Christopher Needs, NP  spironolactone (ALDACTONE) 25 MG tablet Take 0.5 tablets (12.5 mg total) by mouth daily. 03/11/18   Christopher Eva J, DO  Tafamidis 61 MG CAPS Take 1 capsule by mouth daily.    [provider]     Allergies:    Allergies  Allergen Reactions  . Pradaxa [Dabigatran Etexilate Mesylate] Nausea Only    Social History:   Social History   Socioeconomic History  . Marital status: Married    Spouse name: Not on file  . Number of children: 2  . Years of  education: Not on file  . Highest education level: Not on file  Occupational History  . Not on file  Social [place]  . Financial resource strain: Not on file  . Food insecurity:    Worry: Not on file    Inability: Not on file  . Transportation [place]:    Medical: Not on file    Non-medical: Not on file  Tobacco Use  . Smoking status: Former Research scientist (life sciences)  . Smokeless tobacco: Never Used  Substance and Sexual Activity  . Alcohol use: Yes    Alcohol/week: 0.0 standard drinks    Comment: Occasional  . Drug use: No  . Sexual activity: Not on file  Lifestyle  . Physical activity:    Days per week: Not on file    Minutes per session: Not on file  . Stress: Not on file  Relationships  . Social connections:    Talks on phone: Not on file    Gets together: Not on file    Attends religious service: Not on file    Active member of club or organization: Not on file    Attends meetings of clubs or organizations: Not on file    Relationship status: Not on file  . Intimate partner violence:    Fear of current or ex partner: Not on file    Emotionally abused: Not on file    Physically abused: Not on file    Forced sexual activity: Not on file  Other Topics Concern  . Not on file  Social History Narrative  . Not on file    Family History:   The patient's family history includes Heart disease in his unknown relative.    ROS:  Please see the history of present illness.  All other ROS reviewed and negative.     Physical Exam/Data:   Blood pressure 130/82, height 5' 11.5" (1.816 m), weight 253 lb 3.2 oz (114.9 kg).  General appearance: alert, cooperative, no distress and morbidly obese Neck: no carotid bruit and JVD noted Lungs: decreased breath sounds 2/3 up on Rt with dullness to percusion Heart: regular rate and rhythm Abdomen: obes, non tender Extremities: 1+ LE edema Skin: warm and dry Neurologic: Grossly normal  EKG:  The ECG that was done 05/31/18-NSR, SB,  HR 57, QTc  494  Relevant CV Studies: Echo 03/10/18- Study Conclusions  - Left ventricle: The cavity size was normal. There was severe concentric hypertrophy. 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 Consider PYP scan. Systolic function was mildly reduced. The estimated ejection fraction was in the range of 45% to 50%. There is hypokinesis of the basalinferoseptal myocardium. Features are consistent with a pseudonormal left ventricular filling pattern, with concomitant abnormal relaxation and increased filling pressure (grade 2 diastolic dysfunction). - Aortic valve: Trileaflet; moderately thickened, moderately calcified leaflets. There was trivial regurgitation. - Aorta: Ascending aortic diameter: 38 mm (S). - Ascending aorta: The ascending aorta was mildly dilated. -  Left atrium: The atrium was moderately to severely dilated. - Right atrium: The atrium was severely dilated. - Tricuspid valve: There was mild regurgitation. - Pulmonary arteries: PA peak pressure: 40 mm Hg (S).  Laboratory Data:  ChemistryNo results for input(s): NA, K, CL, CO2, GLUCOSE, BUN, CREATININE, CALCIUM, GFRNONAA, GFRAA, ANIONGAP in the last 168 hours.  No results for input(s): PROT, ALBUMIN, AST, ALT, ALKPHOS, BILITOT in the last 168 hours. HematologyNo results for input(s): WBC, RBC, HGB, HCT, MCV, MCH, MCHC, RDW, PLT in the last 168 hours. Cardiac EnzymesNo results for input(s): TROPONINI in the last 168 hours. No results for input(s): TROPIPOC in the last 168 hours.  BNPNo results for input(s): BNP, PROBNP in the last 168 hours.  DDimer No results for input(s): DDIMER in the last 168 hours.  Radiology/Studies:  No results found.  Assessment and Plan:   Acute on chronic systolic CHF (congestive heart failure) (HCC) Pt in the office today with increased SOB and 7 lb weight gain. Recurrent rt pleural effusion on exam.  Pleural effusion on right Noted 2017 and Oct 2019 (diuresed off in  2019). 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  We feel it would be best to admit him to the hospital for IV diuresis.  I have ordered Lasix 80 mg IV twice daily and increased his potassium to 10 mEq twice daily.  We will continue his medication for amyloidosis from the [place].  He will be seen by the heart failure service for further recommendations.  Severity of Illness: The appropriate patient status for this patient is INPATIENT. Inpatient status is judged to be reasonable and necessary in order to provide the required intensity of service to ensure the patient's safety. The patient's presenting symptoms, physical exam findings, and initial radiographic and laboratory data in the context of their chronic comorbidities is felt to place them at high risk for further clinical deterioration. Furthermore, it is not anticipated that the patient will be medically stable for discharge from the hospital within 2 midnights of admission. The following factors support the patient status of inpatient.   " The patient's presenting symptoms include SOB. " The worrisome physical exam findings include Rt pleural effusion. " The initial radiographic and laboratory data are worrisome because of none. " The chronic co-morbidities include PAF.   * I certify that at the point of admission it is my clinical judgment that the patient will require inpatient hospital care spanning beyond 2 midnights from the point of admission due to high intensity of service, high risk for further deterioration and high frequency of surveillance required.*    For questions or updates, please contact [place] Please consult [email or web address] for contact info under        Signed, Kerin Ransom, PA-C  05/31/2018 12:46 PM   As  above, patient seen and examined.  Briefly patient is a 77 year old male with past medical history of chronic combined systolic/diastolic congestive heart failure-paroxysmal atrial fibrillation, amyloidosis, hypertension admitted with acute on chronic combined systolic/diastolic congestive heart failure.  Last echocardiogram October 2019 showed ejection fraction 45 to 50%, severe left ventricular hypertrophy, moderate diastolic dysfunction, biatrial enlargement and amyloid should be considered.  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  Also with history of right pleural effusion.  Patient seen in the office in October  and referred to Dr. Haroldine Laws for amyloid therapy.  However he was seen at the [place] and apparently had blood work consistent with amyloidosis by his report.  Results are not available.  He was started on tafamidis.  Patient states that for the past week he has had progressive dyspnea on exertion, orthopnea and bilateral lower extremity edema.  He denies chest Benton, productive cough or fever/chills.  On examination he is markedly volume overloaded with 3+ lower extremity edema, diminished breath sounds at bases.  Electrocardiogram shows sinus bradycardia and prolonged QT interval. 1 acute on chronic combined systolic/diastolic congestive heart failure-patient is markedly volume overloaded.  We will admit for IV diuresis.  Follow renal function closely.  Continue low-sodium diet and fluid restriction.  2 amyloidosis-we will obtain records from the [place].  Continue tafamidis.  3 paroxysmal atrial fibrillation-patient remains in sinus rhythm.  Plan to continue Tikosyn and apixaban.  Check electrolytes.  QT is prolonged.  We will need to follow and adjust Tikosyn dose if necessary.  4 hypertension-blood pressure is controlled.  Continue present medications and follow.  5 right pleural effusion-patient may require thoracentesis pending results of follow-up chest x-ray and results of  diuresis.  Would hold apixaban for now.  I have asked the heart failure team to follow while in-house.  Christopher Ruths, MD

## 2018-05-31 NOTE — Patient Instructions (Signed)
PATIENT BEING ADMITTED TO ED

## 2018-05-31 NOTE — Assessment & Plan Note (Signed)
Pt in the office today with increased SOB and 7 lb weight gain. Recurrent rt pleural effusion on exam.

## 2018-05-31 NOTE — ED Provider Notes (Addendum)
[facility] DEPT Provider Note   CSN: [id number] Arrival date & time: 05/31/18  1241     History   Chief Complaint Chief Complaint  Patient presents with  . Shortness of Breath    HPI Christopher Benton is a 77 y.o. male.   Shortness of Breath  This is a recurrent problem. The average episode lasts 2 days. The problem occurs continuously.The problem has been gradually worsening. Associated symptoms include cough and leg swelling. Pertinent negatives include no fever and no chest pain. He has tried nothing for the symptoms. The treatment provided no relief. He has had prior hospitalizations. He has had prior ED visits. Associated medical issues include heart failure.    Past Medical History:  Diagnosis Date  . Atrial fibrillation ([place])   . CAD (coronary artery disease)   . Cardiomyopathy ([place])   . Chronic systolic CHF (congestive heart failure) ([place]) 04/12/2016  . Dyspnea   . Dysrhythmia Atrial flutter  . Hypertension     Patient Active Problem List   Diagnosis Date Noted  . 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 Pulmonary edema 05/31/2018  . Acute on chronic diastolic CHF (congestive heart failure) ([place]) 05/31/2018  . Acute on chronic systolic CHF (congestive heart failure) ([place])   . Shortness of breath 03/08/2018  . AKI (acute kidney injury) ([place])   . Elevated troponin   . Pleural effusion on right 04/12/2016  . Acute on chronic systolic congestive heart failure ([place]) 04/12/2016  . CAD (coronary artery disease) 11/27/2015  . PAF (paroxysmal atrial fibrillation) ([place]) 09/25/2015  . Cardiomyopathy ([place]) 09/25/2015  . Typical atrial flutter ([place])   . Atypical atrial flutter ([place]) 08/28/2015  . Essential hypertension 08/28/2015    Past Surgical History:  Procedure Laterality Date  . CARDIOVERSION N/A 09/04/2015   Procedure: CARDIOVERSION;  Surgeon: Skeet Latch, MD;  Location: [facility];  Service: Cardiovascular;  Laterality: N/A;  .  TEE WITHOUT CARDIOVERSION N/A 09/04/2015   Procedure: TRANSESOPHAGEAL ECHOCARDIOGRAM (TEE);  Surgeon: Skeet Latch, MD;  Location: [facility];  Service: Cardiovascular;  Laterality: N/A;  . TEE WITHOUT CARDIOVERSION N/A 04/15/2016   Procedure: TRANSESOPHAGEAL ECHOCARDIOGRAM (TEE);  Surgeon: Jerline Pain, MD;  Location[facility] ENDOSCOPY;  Service: Cardiovascular;  Laterality: N/A;  . TOE AMPUTATION          Home Medications    Prior to Admission medications   Medication Sig Start Date End Date Taking? Authorizing Provider  apixaban (ELIQUIS) 5 MG TABS tablet Take 1 tablet (5 mg total) by mouth 2 (two) times daily. 08/28/15  Yes Lelon Perla, MD  carvedilol (COREG) 12.5 MG tablet Take 1 tablet (12.5 mg total) by mouth 2 (two) times daily. 03/10/18  Yes Bufford Lope, DO  dofetilide (TIKOSYN) 500 MCG capsule Take 1 capsule (500 mcg total) by mouth 2 (two) times daily. 04/28/16  Yes Sherran Needs, NP  dorzolamide (TRUSOPT) 2 % ophthalmic solution Place 1 drop into both eyes 2 (two) times daily.   Yes [provider]  furosemide (LASIX) 40 MG tablet Take 2 tablets (80 mg total) by mouth daily as needed for fluid (for notice weight gain of >3 lbs in 24-48 hours and notify your doctor.). 03/10/18  Yes Orson Eva J, DO  latanoprost (XALATAN) 0.005 % ophthalmic solution Place 1 drop into both eyes at bedtime.   Yes [provider]  Multiple Vitamins-Minerals (MULTIVITAMIN WITH MINERALS) tablet Take 1 tablet by mouth daily.   Yes [provider]  potassium chloride (  K-DUR,KLOR-CON) 10 MEQ tablet Take 1 tablet (10 mEq total) by mouth daily. 04/29/16  Yes Sherran Needs, NP  spironolactone (ALDACTONE) 25 MG tablet Take 0.5 tablets (12.5 mg total) by mouth daily. 03/11/18  Yes Orson Eva J, DO  Tafamidis 61 MG CAPS Take 61 mg by mouth daily.    Yes [provider]    Family History Family History  Problem Relation Age of Onset  . Heart disease Other         No family history    Social History Social History   Tobacco Use  . Smoking status: Former Research scientist (life sciences)  . Smokeless tobacco: Never Used  Substance Use Topics  . Alcohol use: Yes    Alcohol/week: 0.0 standard drinks    Comment: Occasional  . Drug use: No     Allergies   Pradaxa [dabigatran etexilate mesylate]   Review of Systems Review of Systems  Constitutional: Negative for fever.  Respiratory: Positive for cough and shortness of breath.   Cardiovascular: Positive for leg swelling. Negative for chest pain.  All other systems reviewed and are negative.    Physical Exam Updated Vital Signs BP 104/67   Pulse 68   Temp 98.1 F (36.7 C)   Resp (!) 21   SpO2 94%   Physical Exam Vitals signs and nursing note reviewed.  Constitutional:      Appearance: He is well-developed.  HENT:     Head: Normocephalic and atraumatic.  Eyes:     Pupils: Pupils are equal, round, and reactive to light.  Neck:     Musculoskeletal: Normal range of motion.  Cardiovascular:     Rate and Rhythm: Normal rate.  Pulmonary:     Effort: Pulmonary effort is normal. No respiratory distress.     Breath sounds: Examination of the right-upper field reveals decreased breath sounds and rales. Examination of the left-upper field reveals wheezing and rales. Examination of the right-middle field reveals decreased breath sounds. Examination of the right-lower field reveals decreased breath sounds. Examination of the left-lower field reveals wheezing and rales. Decreased breath sounds, wheezing and rales present.  Abdominal:     General: There is no distension.     Palpations: Abdomen is soft.  Musculoskeletal: Normal range of motion.     Left lower leg: Edema present.  Skin:    General: Skin is warm and dry.  Neurological:     General: No focal deficit present.     Mental Status: He is alert.      ED Treatments / Results  Labs (all labs ordered are listed, but only abnormal results are  displayed) Labs Reviewed  COMPREHENSIVE METABOLIC PANEL - Abnormal; Notable for the following components:      Result Value   Glucose, Bld 113 (*)    Total Bilirubin 3.9 (*)    All other components within normal limits  TROPONIN I - Abnormal; Notable for the following components:   Troponin I 0.09 (*)    All other components within normal limits  BRAIN NATRIURETIC PEPTIDE - Abnormal; Notable for the following components:   B Natriuretic Peptide 502.8 (*)    All other components within normal limits  BASIC METABOLIC PANEL - Abnormal; Notable for the following components:   Glucose, Bld 139 (*)    Creatinine, Ser 1.26 (*)    GFR calc non Af Amer 55 (*)    All other components within normal limits  CBC WITH DIFFERENTIAL/PLATELET  BRAIN NATRIURETIC PEPTIDE  CBC WITH DIFFERENTIAL/PLATELET  COMPREHENSIVE METABOLIC PANEL  MAGNESIUM  TSH    EKG EKG Interpretation  Date/Time:  Tuesday May 31 2018 13:04:46 EST Ventricular Rate:  64 PR Interval:    QRS Duration: 104 QT Interval:  506 QTC Calculation: 523 R Axis:   -156 Text Interpretation:  Sinus rhythm Prolonged PR interval Anterior infarct, old Prolonged QT interval longer QT No acute changes Confirmed by Merrily Pew [id number]) on 06/01/2018 8:45:40 AM   Radiology Dg Chest 2 View  Result Date: 05/31/2018 CLINICAL DATA:  Shortness of breath EXAM: CHEST - 2 VIEW COMPARISON:  March 08, 2018 chest radiograph and chest CT FINDINGS: There is a sizable pleural effusion on the right. There is compressive atelectasis and questionable consolidation in much of the right middle and lower lobes. There is slight left base atelectasis. Left lung otherwise is clear. Heart size and pulmonary vascularity are normal. No adenopathy. There is aortic atherosclerosis. There is degenerative change in the thoracic spine. IMPRESSION: Sizable right pleural effusion. Compressive atelectasis right middle and lower lobes with questionable consolidation in  these areas. Left lung clear. Except for slight left base atelectasis. Heart size normal.  There is aortic atherosclerosis. Aortic Atherosclerosis (ICD10-I70.0). Electronically Signed   By: Lowella Grip III M.D.   On: 05/31/2018 14:08    Procedures Procedures (including critical care time).  CRITICAL CARE Performed by: Merrily Pew Total critical care time: 35 minutes Critical care time was exclusive of separately billable procedures and treating other patients. Critical care was necessary to Benton or prevent imminent or life-threatening deterioration. Critical care was time spent personally by me on the following activities: development of treatment plan with patient and/or surrogate as well as nursing, discussions with consultants, evaluation of patient's response to treatment, examination of patient, obtaining history from patient or surrogate, ordering and performing treatments and interventions, ordering and review of laboratory studies, ordering and review of radiographic studies, pulse oximetry and re-evaluation of patient's condition.   Medications Ordered in ED Medications  acetaminophen (TYLENOL) tablet 650 mg (has no administration in time range)  ALPRAZolam (XANAX) tablet 0.25 mg (has no administration in time range)  ondansetron (ZOFRAN) injection 4 mg (has no administration in time range)  zolpidem (AMBIEN) tablet 5 mg (has no administration in time range)  0.9 %  sodium chloride infusion (has no administration in time range)  sodium chloride flush (NS) 0.9 % injection 3 mL (3 mLs Intravenous Given 05/31/18 2230)  aspirin EC tablet 81 mg (has no administration in time range)  nitroGLYCERIN (NITROSTAT) SL tablet 0.4 mg (has no administration in time range)  apixaban (ELIQUIS) tablet 5 mg (5 mg Oral Given 05/31/18 2310)  carvedilol (COREG) tablet 12.5 mg (12.5 mg Oral Given 06/01/18 0830)  dofetilide (TIKOSYN) capsule 500 mcg (500 mcg Oral Given 06/01/18 0830)  furosemide  (LASIX) injection 80 mg (80 mg Intravenous Given 06/01/18 0835)  latanoprost (XALATAN) 0.005 % ophthalmic solution 1 drop (1 drop Both Eyes Given 05/31/18 2307)  losartan (COZAAR) tablet 12.5 mg (12.5 mg Oral Not Given 05/31/18 2040)  potassium chloride (K-DUR,KLOR-CON) CR tablet 10 mEq (0 mEq Oral Hold 05/31/18 2230)  spironolactone (ALDACTONE) tablet 12.5 mg (has no administration in time range)  Tafamidis CAPS 61 mg (has no administration in time range)  dorzolamide (TRUSOPT) 2 % ophthalmic solution 1 drop (1 drop Both Eyes Given 05/31/18 2330)  furosemide (LASIX) injection 80 mg (80 mg Intravenous Given 05/31/18 1355)     Initial Impression / Assessment and Plan / ED Course  I have  reviewed the triage vital signs and the nursing notes.  Pertinent labs & imaging results that were available during my care of the patient were reviewed by me and considered in my medical decision making (see chart for details).   Seen in office. EMS called and told specifically to take to [facility] for admission, however apparently they decided [facility] was a better place for the patient?  Either way, recurrent R Pleural effusion with pulmonary edema/fluid overload. On oxygen, Will start diuresis. Admission orders placed.   Final Clinical Impressions(s) / ED Diagnoses   Final diagnoses:  Acute on chronic congestive heart failure, unspecified heart failure type [facility])    ED Discharge Orders    None       Mabel Unrein, Corene Cornea, MD 06/01/18 4239    Merrily Pew, MD 07/21/18 0100

## 2018-05-31 NOTE — Assessment & Plan Note (Signed)
On Eliquis and Tikosyn

## 2018-05-31 NOTE — ED Triage Notes (Signed)
Pt presents with c/o shortness of breath from [facility]. Pt reports the shortness of breath started 2 days ago, hx of CHF. Pt denies any chest pain at this time.

## 2018-05-31 NOTE — Progress Notes (Signed)
05/31/2018 Christopher Benton   Dec 18, 1940  [id number]  Primary Physician [name], MD Primary Cardiologist: Dr Stanford Breed  HPI: The patient is a 77 year old male followed by Dr. 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  Echocardiogram in the past that showed preserved LV function with severe LVH.  He has been seen at the [place] and started on Tamfamidiis 61 mg daily.  He has had prior right pleural effusion that was tapped in 2017 and again in October 2019. He was admitted and diuresed in October. He is in the office today after he called this morning complaining of increasing shortness of breath over the last couple weeks.  He is also had increasing edema.  His weight is up 7 pounds.  He was encouraged to go to the emergency room when he called but he wanted to come to the office.  He does have orthopnea.   Current Outpatient Medications  Medication Sig Dispense Refill  . apixaban (ELIQUIS) 5 MG TABS tablet Take 1 tablet (5 mg total) by mouth 2 (two) times daily. 60 tablet 6  . carvedilol (COREG) 12.5 MG tablet Take 1 tablet (12.5 mg total) by mouth 2 (two) times daily. 60 tablet 0  . dofetilide (TIKOSYN) 500 MCG capsule Take 1 capsule (500 mcg total) by mouth 2 (two) times daily. 60 capsule 3  . dorzolamide (TRUSOPT) 2 % ophthalmic solution Place 1 drop into both eyes 2 (two) times daily.    . furosemide (LASIX) 40 MG tablet Take 2 tablets (80 mg total) by mouth daily as needed for fluid (for notice weight gain of >3 lbs in 24-48 hours and notify your doctor.). 30 tablet 0  . latanoprost (XALATAN) 0.005 % ophthalmic solution Place 1 drop into both eyes at bedtime.    Marland Kitchen losartan (COZAAR) 25 MG tablet Take 12.5 mg by mouth daily.     . Multiple Vitamins-Minerals (MULTIVITAMIN WITH MINERALS) tablet Take 1 tablet by mouth daily.    . potassium chloride (K-DUR,KLOR-CON) 10 MEQ tablet Take 1 tablet (10 mEq total) by  mouth daily. 30 tablet 6  . spironolactone (ALDACTONE) 25 MG tablet Take 0.5 tablets (12.5 mg total) by mouth daily. 30 tablet 0   No current facility-administered medications for this visit.     Allergies  Allergen Reactions  . Pradaxa [Dabigatran Etexilate Mesylate] Nausea Only    Past Medical History:  Diagnosis Date  . Atrial fibrillation ([place])   . CAD (coronary artery disease)   . Cardiomyopathy ([place])   . Chronic systolic CHF (congestive heart failure) ([place]) 04/12/2016  . Dyspnea   . Dysrhythmia Atrial flutter  . Hypertension     Social History   Socioeconomic History  . Marital status: Married    Spouse name: Not on file  . Number of children: 2  . Years of education: Not on file  . Highest education level: Not on file  Occupational History  . Not on file  Social Needs  . Financial resource strain: Not on file  . Food insecurity:    Worry: Not on file    Inability: Not on file  . Transportation needs:    Medical: Not on file    Non-medical: Not on file  Tobacco Use  . Smoking status: Former Research scientist (life sciences)  . Smokeless tobacco: Never Used  Substance and Sexual Activity  . Alcohol use: Yes    Alcohol/week: 0.0 standard drinks    Comment: Occasional  .  Drug use: No  . Sexual activity: Not on file  Lifestyle  . Physical activity:    Days per week: Not on file    Minutes per session: Not on file  . Stress: Not on file  Relationships  . Social connections:    Talks on phone: Not on file    Gets together: Not on file    Attends religious service: Not on file    Active member of club or organization: Not on file    Attends meetings of clubs or organizations: Not on file    Relationship status: Not on file  . Intimate partner violence:    Fear of current or ex partner: Not on file    Emotionally abused: Not on file    Physically abused: Not on file    Forced sexual activity: Not on file  Other Topics Concern  . Not on file  Social History Narrative  . Not on  file     Family History  Problem Relation Age of Onset  . Heart disease Unknown        No family history     Review of Systems: General: negative for chills, fever, night sweats or weight changes.  Cardiovascular: negative for chest pain, palpitations, paroxysmal nocturnal dyspnea  Dermatological: negative for rash Respiratory: negative for cough or wheezing Urologic: negative for hematuria Abdominal: negative for nausea, vomiting, diarrhea, bright red blood per rectum, melena, or hematemesis Neurologic: negative for visual changes, syncope, or dizziness All other systems reviewed and are otherwise negative except as noted above.    Blood pressure 130/82, height 5' 11.5" (1.816 m), weight 253 lb 3.2 oz (114.9 kg).  General appearance: alert, cooperative, no distress and morbidly obese Neck: no carotid bruit and JVD noted Lungs: decreased breath sounds 2/3 up on Rt with dullness to percusion Heart: regular rate and rhythm Abdomen: obes, non tender Extremities: 1+ LE edema Skin: warm and dry Neurologic: Grossly normal  Echo 03/10/18- Study Conclusions  - Left ventricle: The cavity size was normal. There was severe   concentric hypertrophy. 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   Consider PYP scan. Systolic function was mildly reduced. The   estimated ejection fraction was in the range of 45% to 50%. There   is hypokinesis of the basalinferoseptal myocardium. Features are   consistent with a pseudonormal left ventricular filling pattern,   with concomitant abnormal relaxation and increased filling   pressure (grade 2 diastolic dysfunction). - Aortic valve: Trileaflet; moderately thickened, moderately   calcified leaflets. There was trivial regurgitation. - Aorta: Ascending aortic diameter: 38 mm (S). - Ascending aorta: The ascending aorta was mildly dilated. - Left atrium: The atrium was moderately to severely dilated. - Right atrium: The atrium was severely dilated. -  Tricuspid valve: There was mild regurgitation. - Pulmonary arteries: PA peak pressure: 40 mm Hg (S).   ASSESSMENT AND PLAN:   Acute on chronic systolic CHF (congestive heart failure) (HCC) Pt in the office today with increased SOB and 7 lb weight gain. Recurrent rt pleural effusion on exam.  Pleural effusion on right Noted 2017 and Oct 2019 (diuresed off in 2019). 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   Kerin Ransom PA-C 05/31/2018 11:01 AM

## 2018-05-31 NOTE — Progress Notes (Signed)
Attempted to complete nursing admission history. Pt refused to answer questions. He states he wants to wait until he gets to [facility] to answer them. Lucius Conn BSN, RN-BC Admissions RN 05/31/2018 3:51 PM

## 2018-05-31 NOTE — ED Notes (Signed)
Patient transported to radiology

## 2018-05-31 NOTE — Assessment & Plan Note (Signed)
Noted 2017 and Oct 2019 (diuresed off in 2019). Recurrent on exam

## 2018-05-31 NOTE — ED Notes (Signed)
Called pharmacy for 2nd time about patient's ordered medications. Awaiting for medications to be sent by pharmacy.

## 2018-05-31 NOTE — Telephone Encounter (Signed)
Spoke with pt wife, for the last week the patient has had increase SOB and swelling in his feet and ankles. He gets SOB dressing or taking a shower. Last night he was not able to rest due to SOB he had to prop up on several pillows. His weight is stable. He has taken the prn lasix daily for the last 3 weeks with no change. Appointment scheduled for 10 am this morning with luke kilroy pa.

## 2018-05-31 NOTE — Assessment & Plan Note (Signed)
Severe LVH on echo with EF 45-50%

## 2018-05-31 NOTE — ED Triage Notes (Signed)
Pt sent from [facility]. Pt complains of SOB since last night. Pt has hx of CHF. Pt denies chest pain.   O2 92% on RA, 96% on 2L [place] BP 132/60 HR 70 NSR RR 22

## 2018-06-01 ENCOUNTER — Encounter (HOSPITAL_COMMUNITY): Payer: Self-pay | Admitting: General Practice

## 2018-06-01 ENCOUNTER — Other Ambulatory Visit: Payer: Self-pay

## 2018-06-01 DIAGNOSIS — I5033 Acute on chronic diastolic (congestive) heart failure: Secondary | ICD-10-CM

## 2018-06-01 DIAGNOSIS — E854 Organ-limited amyloidosis: Secondary | ICD-10-CM

## 2018-06-01 DIAGNOSIS — I43 Cardiomyopathy in diseases classified elsewhere: Secondary | ICD-10-CM

## 2018-06-01 LAB — BASIC METABOLIC PANEL
Anion gap: 12 (ref 5–15)
BUN: 20 mg/dL (ref 8–23)
CO2: 24 mmol/L (ref 22–32)
Calcium: 9.4 mg/dL (ref 8.9–10.3)
Chloride: 101 mmol/L (ref 98–111)
Creatinine, Ser: 1.26 mg/dL — ABNORMAL HIGH (ref 0.61–1.24)
GFR calc Af Amer: >60 mL/min (ref >60)
GFR calc non Af Amer: 55 mL/min — ABNORMAL LOW (ref >60)
Glucose, Bld: 139 mg/dL — ABNORMAL HIGH (ref 70–99)
Potassium: 3.8 mmol/L (ref 3.5–5.1)
Sodium: 137 mmol/L (ref 135–145)

## 2018-06-01 MED ORDER — BENZONATATE 100 MG PO CAPS
100.0000 mg | ORAL_CAPSULE | Freq: Three times a day (TID) | ORAL | Status: DC | PRN
  Administered 2018-06-02: 100 mg via ORAL
  Filled 2018-06-01 (×2): qty 1

## 2018-06-01 NOTE — Progress Notes (Signed)
Patient arrived to unit in NAD. Patient nauseated from ambulance ride, 4mg  Zofran IV given. VS stable and wife at bedside.

## 2018-06-01 NOTE — ED Notes (Signed)
ED TO INPATIENT HANDOFF REPORT  Name/Age/Gender Christopher Benton 78 y.o. male  Code Status    Code Status Orders  (From admission, onward)         Start     Ordered   05/31/18 1831  Full code  Continuous     05/31/18 1831        Code Status History    Date Active Date Inactive Code Status Order ID Comments User Context   03/08/2018 1658 03/11/2018 0108 Full Code [id number]  Bufford Lope, DO Inpatient   04/15/2016 1201 04/18/2016 1624 Full Code [id number]  Patsey Berthold, NP Inpatient   04/12/2016 1755 04/15/2016 1201 Full Code [id number]  Mendel Corning, MD Inpatient      Home/SNF/Other Home  Chief Complaint short of breath   Level of Care/Admitting Diagnosis ED Disposition    ED Disposition Condition [place]: [place] [100100]  Level of Care: Medical Telemetry [104]  Diagnosis: Acute on chronic diastolic CHF (congestive heart failure) [facility]) [[id number]]  Admitting Physician: Palmer, [place]  Attending Physician: Lelon Perla [[id number]]  Estimated length of stay: past midnight tomorrow  Certification:: I certify this patient will need inpatient services for at least 2 midnights  PT Class (Do Not Modify): Inpatient [101]  PT Acc Code (Do Not Modify): Private [1]       Medical History Past Medical History:  Diagnosis Date  . Atrial fibrillation ([place])   . CAD (coronary artery disease)   . Cardiomyopathy ([place])   . Chronic systolic CHF (congestive heart failure) ([place]) 04/12/2016  . Dyspnea   . Dysrhythmia Atrial flutter  . Hypertension     Allergies Allergies  Allergen Reactions  . Pradaxa [Dabigatran Etexilate Mesylate] Nausea Only    IV Location/Drains/Wounds Patient Lines/Drains/Airways Status   Active Line/Drains/Airways    Name:   Placement date:   Placement time:   Site:   Days:   Peripheral IV 05/31/18 Left Antecubital   05/31/18    1240    Antecubital   1          Labs/Imaging Results  for orders placed or performed during the hospital encounter of 05/31/18 (from the past 48 hour(s))  CBC with Differential     Status: None   Collection Time: 05/31/18  1:33 PM  Result Value Ref Range   WBC 9.7 4.0 - 10.5 K/uL   RBC 5.68 4.22 - 5.81 MIL/uL   Hemoglobin 16.1 13.0 - 17.0 g/dL   HCT 51.0 39.0 - 52.0 %   MCV 89.8 80.0 - 100.0 fL   MCH 28.3 26.0 - 34.0 pg   MCHC 31.6 30.0 - 36.0 g/dL   RDW 13.4 11.5 - 15.5 %   Platelets 202 150 - 400 K/uL   nRBC 0.0 0.0 - 0.2 %   Neutrophils Relative % 65 %   Neutro Abs 6.4 1.7 - 7.7 K/uL   Lymphocytes Relative 22 %   Lymphs Abs 2.1 0.7 - 4.0 K/uL   Monocytes Relative 10 %   Monocytes Absolute 0.9 0.1 - 1.0 K/uL   Eosinophils Relative 1 %   Eosinophils Absolute 0.1 0.0 - 0.5 K/uL   Basophils Relative 1 %   Basophils Absolute 0.1 0.0 - 0.1 K/uL   Immature Granulocytes 1 %   Abs Immature Granulocytes 0.06 0.00 - 0.07 K/uL    Comment: Performed at [facility], [place] [address] [place], [place] [address]  Comprehensive  metabolic panel     Status: Abnormal   Collection Time: 05/31/18  1:33 PM  Result Value Ref Range   Sodium 135 135 - 145 mmol/L   Potassium 4.5 3.5 - 5.1 mmol/L   Chloride 100 98 - 111 mmol/L   CO2 24 22 - 32 mmol/L   Glucose, Bld 113 (H) 70 - 99 mg/dL   BUN 15 8 - 23 mg/dL   Creatinine, Ser 1.13 0.61 - 1.24 mg/dL   Calcium 9.6 8.9 - 10.3 mg/dL   Total Protein 8.1 6.5 - 8.1 g/dL   Albumin 4.7 3.5 - 5.0 g/dL   AST 27 15 - 41 U/L   ALT 15 0 - 44 U/L   Alkaline Phosphatase 72 38 - 126 U/L   Total Bilirubin 3.9 (H) 0.3 - 1.2 mg/dL   GFR calc non Af Amer >60 >60 mL/min   GFR calc Af Amer >60 >60 mL/min   Anion gap 11 5 - 15    Comment: Performed at [facility], [place] [address], [place], [place] [address]  Troponin I - ONCE - STAT     Status: Abnormal   Collection Time: 05/31/18  1:33 PM  Result Value Ref Range   Troponin I 0.09 (HH) <0.03 ng/mL    Comment: CRITICAL RESULT  CALLED TO, READ BACK BY AND VERIFIED WITH: S.LEONARD AT 1442 ON 05/31/18 BY N.THOMPSON Performed at [facility], [place] [address], [place], [place] [address]   Brain natriuretic peptide     Status: Abnormal   Collection Time: 05/31/18  1:33 PM  Result Value Ref Range   B Natriuretic Peptide 502.8 (H) 0.0 - 100.0 pg/mL    Comment: Performed at [facility], [place] [address] [place], [place] [address]  Basic metabolic panel     Status: Abnormal   Collection Time: 06/01/18  1:20 AM  Result Value Ref Range   Sodium 137 135 - 145 mmol/L   Potassium 3.8 3.5 - 5.1 mmol/L   Chloride 101 98 - 111 mmol/L   CO2 24 22 - 32 mmol/L   Glucose, Bld 139 (H) 70 - 99 mg/dL   BUN 20 8 - 23 mg/dL   Creatinine, Ser 1.26 (H) 0.61 - 1.24 mg/dL   Calcium 9.4 8.9 - 10.3 mg/dL   GFR calc non Af Amer 55 (L) >60 mL/min   GFR calc Af Amer >60 >60 mL/min   Anion gap 12 5 - 15    Comment: Performed at [facility], [place] [address], [place], [place] [address]   Dg Chest 2 View  Result Date: 05/31/2018 CLINICAL DATA:  Shortness of breath EXAM: CHEST - 2 VIEW COMPARISON:  March 08, 2018 chest radiograph and chest CT FINDINGS: There is a sizable pleural effusion on the right. There is compressive atelectasis and questionable consolidation in much of the right middle and lower lobes. There is slight left base atelectasis. Left lung otherwise is clear. Heart size and pulmonary vascularity are normal. No adenopathy. There is aortic atherosclerosis. There is degenerative change in the thoracic spine. IMPRESSION: Sizable right pleural effusion. Compressive atelectasis right middle and lower lobes with questionable consolidation in these areas. Left lung clear. Except for slight left base atelectasis. Heart size normal.  There is aortic atherosclerosis. Aortic Atherosclerosis (ICD10-I70.0). Electronically Signed   By: Lowella Grip III M.D.   On: 05/31/2018 14:08   EKG  Interpretation  Date/Time:  Tuesday May 31 2018 21:48:12 EST Ventricular Rate:  65 PR Interval:    QRS Duration: 99 QT Interval:  494 QTC Calculation: 514 R Axis:   175 Text Interpretation:  Sinus arrhythmia Prolonged PR interval Probable right ventricular hypertrophy Prolonged QT interval No significant change since last tracing Confirmed by Orlie Dakin [phone]) on 06/01/2018 11:04:36 AM   Pending Labs Unresulted Labs (From admission, onward)    Start     Ordered   06/01/18 1443  Basic metabolic panel  Daily,   R     05/31/18 1831   05/31/18 1831  Brain natriuretic peptide  Once,   R     05/31/18 1831   05/31/18 1831  CBC WITH DIFFERENTIAL  Once,   R     05/31/18 1831   05/31/18 1831  Comprehensive metabolic panel  Once,   R     05/31/18 1831   05/31/18 1831  Magnesium  Once,   R     05/31/18 1831   05/31/18 1831  TSH  Once,   R     05/31/18 1831          Vitals/Pain Today's Vitals   06/01/18 0615 06/01/18 0721 06/01/18 0730 06/01/18 0815  BP: 109/79  105/72 104/67  Pulse:    68  Resp:   (!) 21   Temp:      SpO2:    94%  PainSc:  0-No pain      Isolation Precautions No active isolations  Medications Medications  acetaminophen (TYLENOL) tablet 650 mg (has no administration in time range)  ALPRAZolam (XANAX) tablet 0.25 mg (has no administration in time range)  ondansetron (ZOFRAN) injection 4 mg (has no administration in time range)  zolpidem (AMBIEN) tablet 5 mg (has no administration in time range)  0.9 %  sodium chloride infusion (has no administration in time range)  sodium chloride flush (NS) 0.9 % injection 3 mL (3 mLs Intravenous Given 05/31/18 2230)  nitroGLYCERIN (NITROSTAT) SL tablet 0.4 mg (has no administration in time range)  carvedilol (COREG) tablet 12.5 mg (12.5 mg Oral Given 06/01/18 0830)  dofetilide (TIKOSYN) capsule 500 mcg (500 mcg Oral Given 06/01/18 0830)  furosemide (LASIX) injection 80 mg (80 mg Intravenous Given 06/01/18 0835)   latanoprost (XALATAN) 0.005 % ophthalmic solution 1 drop (1 drop Both Eyes Given 05/31/18 2307)  potassium chloride (K-DUR,KLOR-CON) CR tablet 10 mEq (10 mEq Oral Given 06/01/18 1117)  spironolactone (ALDACTONE) tablet 12.5 mg (12.5 mg Oral Given 06/01/18 1117)  Tafamidis CAPS 61 mg (has no administration in time range)  dorzolamide (TRUSOPT) 2 % ophthalmic solution 1 drop (1 drop Both Eyes Given 06/01/18 1120)  benzonatate (TESSALON) capsule 100 mg (has no administration in time range)  furosemide (LASIX) injection 80 mg (80 mg Intravenous Given 05/31/18 1355)    Mobility walks

## 2018-06-01 NOTE — Progress Notes (Signed)
Progress Note  Patient Name: Christopher Benton Date of Encounter: 06/01/2018  Primary Cardiologist: Kirk Ruths, MD  Subjective   Has had brisk urine output. Has thick clear phlegm, difficult to cough up. Breathing somewhat improved. Many questions re: diet, lifestyle recommendations given volume overload, reviewed at length today.  Inpatient Medications    Scheduled Meds: . apixaban  5 mg Oral BID  . aspirin EC  81 mg Oral Daily  . carvedilol  12.5 mg Oral BID WC  . dofetilide  500 mcg Oral BID  . dorzolamide  1 drop Both Eyes BID  . furosemide  80 mg Intravenous BID  . latanoprost  1 drop Both Eyes QHS  . losartan  12.5 mg Oral Daily  . potassium chloride  10 mEq Oral BID  . sodium chloride flush  3 mL Intravenous Q12H  . spironolactone  12.5 mg Oral Daily  . Tafamidis  61 mg Oral Daily   Continuous Infusions: . sodium chloride     PRN Meds: sodium chloride, acetaminophen, ALPRAZolam, nitroGLYCERIN, ondansetron (ZOFRAN) IV, zolpidem   Vital Signs    Vitals:   06/01/18 0530 06/01/18 0615 06/01/18 0730 06/01/18 0815  BP: 107/72 109/79 105/72 104/67  Pulse: 68   68  Resp: 17  (!) 21   Temp:      SpO2: 93%   94%    Intake/Output Summary (Last 24 hours) at 06/01/2018 1042 Last data filed at 06/01/2018 0341 Gross per 24 hour  Intake -  Output 3600 ml  Net -3600 ml   There were no vitals filed for this visit.  Telemetry    Sinus rhythm/sinus arrhythmia- Personally Reviewed  ECG    NSR/SB - Personally Reviewed  Physical Exam   GEN: No acute distress.   Neck: JVD to mid neck at 90 degrees. Cardiac: RRR, no murmurs, rubs, or gallops.  Respiratory: fine crackles at left base, diminished breath sounds on right mid to base area. GI: Soft, nontender, non-distended  MS: bilateral 1-2+ LE edema; No deformity. Neuro:  Nonfocal  Psych: Normal affect   Labs    Chemistry Recent Labs  Lab 05/31/18 1333 06/01/18 0120  NA 135 137  K 4.5 3.8  CL 100 101    CO2 24 24  GLUCOSE 113* 139*  BUN 15 20  CREATININE 1.13 1.26*  CALCIUM 9.6 9.4  PROT 8.1  --   ALBUMIN 4.7  --   AST 27  --   ALT 15  --   ALKPHOS 72  --   BILITOT 3.9*  --   GFRNONAA >60 55*  GFRAA >60 >60  ANIONGAP 11 12     Hematology Recent Labs  Lab 05/31/18 1333  WBC 9.7  RBC 5.68  HGB 16.1  HCT 51.0  MCV 89.8  MCH 28.3  MCHC 31.6  RDW 13.4  PLT 202    Cardiac Enzymes Recent Labs  Lab 05/31/18 1333  TROPONINI 0.09*   No results for input(s): TROPIPOC in the last 168 hours.   BNP Recent Labs  Lab 05/31/18 1333  BNP 502.8*     DDimer No results for input(s): DDIMER in the last 168 hours.   Radiology    Dg Chest 2 View  Result Date: 05/31/2018 CLINICAL DATA:  Shortness of breath EXAM: CHEST - 2 VIEW COMPARISON:  March 08, 2018 chest radiograph and chest CT FINDINGS: There is a sizable pleural effusion on the right. There is compressive atelectasis and questionable consolidation in much of the right  middle and lower lobes. There is slight left base atelectasis. Left lung otherwise is clear. Heart size and pulmonary vascularity are normal. No adenopathy. There is aortic atherosclerosis. There is degenerative change in the thoracic spine. IMPRESSION: Sizable right pleural effusion. Compressive atelectasis right middle and lower lobes with questionable consolidation in these areas. Left lung clear. Except for slight left base atelectasis. Heart size normal.  There is aortic atherosclerosis. Aortic Atherosclerosis (ICD10-I70.0). Electronically Signed   By: Lowella Grip III M.D.   On: 05/31/2018 14:08    Cardiac Studies   Echo 03/10/18- Study Conclusions  - Left ventricle: The cavity size was normal. There was severe concentric hypertrophy. 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 Consider PYP scan. Systolic function was mildly reduced. The estimated ejection fraction was in the range of 45% to 50%. There is hypokinesis of the  basalinferoseptal myocardium. Features are consistent with a pseudonormal left ventricular filling pattern, with concomitant abnormal relaxation and increased filling pressure (grade 2 diastolic dysfunction). - Aortic valve: Trileaflet; moderately thickened, moderately calcified leaflets. There was trivial regurgitation. - Aorta: Ascending aortic diameter: 38 mm (S). - Ascending aorta: The ascending aorta was mildly dilated. - Left atrium: The atrium was moderately to severely dilated. - Right atrium: The atrium was severely dilated. - Tricuspid valve: There was mild regurgitation. - Pulmonary arteries: PA peak pressure: 40 mm Hg (S).   Patient Profile     78 y.o. 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 -spent extensive time on nutrition counseling, as sodium consumption at home has been problematic -heart failure education reviewed, re: weights, med adherence, low sodium diet (discussed extensively), activity, fluid restriction.  -right pleural effusion with left fine crackles as well. Saturating well on room air. No acute indication for thoracentesis. Continue diuresis -has had nearly 4 liters out, but still with elevated JVD and volume overload. Cr within recent range. Continue diuresis. -K 3.8 today, receiving supplementation -continue carvedilol -continue spironolactone -reports he is no longer on losartan, discontinued -continue tafamadis -gave tessalon for thick mucus, no infectious symptoms  Paroxysmal atrial fibrillation -continue dofetilide. QT difficult to assess on most recent ECG, repeating. -Per notes plan was to hold apixaban in case of need for  thoracentesis. Received dose last evening, will hold for now though no urgent indication for thoracentesis on exam. -is on anticoagulation, does not appear that he was on aspirin as an outpatient. Has a mildly elevated troponin, similar to chronic elevation. Will stop aspirin.  Awaiting transfer to main [facility] for further evaluation.  TIME SPENT WITH PATIENT: >35 minutes of direct patient care. More than 50% of that time was spent on coordination of care and counseling regarding heart failure counseling and education.  Buford Dresser, MD, PhD [facility]   For questions or updates, please contact [place] Please consult [email or web address] for contact info under     Signed, Buford Dresser, MD  06/01/2018, 10:42 AM

## 2018-06-02 ENCOUNTER — Inpatient Hospital Stay (HOSPITAL_COMMUNITY): Payer: No Typology Code available for payment source

## 2018-06-02 ENCOUNTER — Encounter: Payer: Self-pay | Admitting: Physician Assistant

## 2018-06-02 DIAGNOSIS — E8582 Wild-type transthyretin-related (ATTR) amyloidosis: Secondary | ICD-10-CM

## 2018-06-02 DIAGNOSIS — N179 Acute kidney failure, unspecified: Secondary | ICD-10-CM

## 2018-06-02 HISTORY — PX: IR THORACENTESIS ASP PLEURAL SPACE W/IMG GUIDE: IMG5380

## 2018-06-02 LAB — BASIC METABOLIC PANEL
Anion gap: 10 (ref 5–15)
BUN: 19 mg/dL (ref 8–23)
CO2: 27 mmol/L (ref 22–32)
Calcium: 9.4 mg/dL (ref 8.9–10.3)
Chloride: 99 mmol/L (ref 98–111)
Creatinine, Ser: 1.59 mg/dL — ABNORMAL HIGH (ref 0.61–1.24)
GFR calc Af Amer: 48 mL/min — ABNORMAL LOW (ref >60)
GFR calc non Af Amer: 41 mL/min — ABNORMAL LOW (ref >60)
Glucose, Bld: 102 mg/dL — ABNORMAL HIGH (ref 70–99)
Potassium: 3.9 mmol/L (ref 3.5–5.1)
Sodium: 136 mmol/L (ref 135–145)

## 2018-06-02 LAB — BODY FLUID CELL COUNT WITH DIFFERENTIAL
Lymphs, Fluid: 59 %
Monocyte-Macrophage-Serous Fluid: 31 % — ABNORMAL LOW (ref 50–90)
Neutrophil Count, Fluid: 10 % (ref 0–25)
Total Nucleated Cell Count, Fluid: 33 cu mm (ref 0–1000)

## 2018-06-02 LAB — HEPATIC FUNCTION PANEL
ALT: 14 U/L (ref 0–44)
AST: 25 U/L (ref 15–41)
Albumin: 4.5 g/dL (ref 3.5–5.0)
Alkaline Phosphatase: 72 U/L (ref 38–126)
Bilirubin, Direct: 0.7 mg/dL — ABNORMAL HIGH (ref 0.0–0.2)
Indirect Bilirubin: 2.7 mg/dL — ABNORMAL HIGH (ref 0.3–0.9)
Total Bilirubin: 3.4 mg/dL — ABNORMAL HIGH (ref 0.3–1.2)
Total Protein: 7.8 g/dL (ref 6.5–8.1)

## 2018-06-02 LAB — PROTEIN, PLEURAL OR PERITONEAL FLUID: Total protein, fluid: 3.7 g/dL

## 2018-06-02 LAB — LACTATE DEHYDROGENASE: LDH: 180 U/L (ref 98–192)

## 2018-06-02 LAB — GRAM STAIN

## 2018-06-02 LAB — ALBUMIN, PLEURAL OR PERITONEAL FLUID: Albumin, Fluid: 2.6 g/dL

## 2018-06-02 MED ORDER — POTASSIUM CHLORIDE CRYS ER 20 MEQ PO TBCR
20.0000 meq | EXTENDED_RELEASE_TABLET | Freq: Once | ORAL | Status: AC
  Administered 2018-06-02: 20 meq via ORAL
  Filled 2018-06-02: qty 2

## 2018-06-02 MED ORDER — LIDOCAINE HCL 1 % IJ SOLN
INTRAMUSCULAR | Status: DC | PRN
  Administered 2018-06-02: 10 mL

## 2018-06-02 MED ORDER — GUAIFENESIN-DM 100-10 MG/5ML PO SYRP
10.0000 mL | ORAL_SOLUTION | Freq: Four times a day (QID) | ORAL | Status: DC | PRN
  Administered 2018-06-02 – 2018-06-06 (×12): 10 mL via ORAL
  Filled 2018-06-02 (×12): qty 10

## 2018-06-02 MED ORDER — DOCUSATE SODIUM 100 MG PO CAPS
100.0000 mg | ORAL_CAPSULE | Freq: Two times a day (BID) | ORAL | Status: DC | PRN
  Administered 2018-06-02 – 2018-06-03 (×2): 100 mg via ORAL
  Filled 2018-06-02 (×2): qty 1

## 2018-06-02 MED ORDER — FUROSEMIDE 10 MG/ML IJ SOLN
80.0000 mg | Freq: Two times a day (BID) | INTRAMUSCULAR | Status: DC
  Administered 2018-06-02 – 2018-06-06 (×9): 80 mg via INTRAVENOUS
  Filled 2018-06-02 (×10): qty 8

## 2018-06-02 MED ORDER — LIDOCAINE HCL 1 % IJ SOLN
INTRAMUSCULAR | Status: AC
  Administered 2018-06-02: 17:00:00
  Filled 2018-06-02: qty 20

## 2018-06-02 MED ORDER — MAGNESIUM HYDROXIDE 400 MG/5ML PO SUSP: 30.0000 mL | Freq: Every evening | ORAL | Status: DC | PRN

## 2018-06-02 NOTE — Progress Notes (Signed)
Pharmacist Rod Holler) examined Tafamidis capsules and states pt can now take them as ordered.

## 2018-06-02 NOTE — Progress Notes (Signed)
Pt states he has "bad cough." RN gave pt tessalon. Pt coughed up large amounts of red/orange tint mucous. RN notified MD. MD states to continue to monitor pt.

## 2018-06-02 NOTE — Progress Notes (Signed)
Pt's wife refuses to send Tafamidis CAPS to pharmacy. RN educated pt and his wife. RN spoke with pharmacy. Pharmacist states she will come up to evaluate medication. Pharmacist states pt can keep medication at bedside; however, pt's wife cannot give it to him as this is the RN's responsibility. RN informed pt and wife of this. Pt and wife verbalize understanding. RN made charge nurse aware.

## 2018-06-02 NOTE — Consult Note (Addendum)
Advanced Heart Failure Team Consult Note   Primary Physician: [name], MD PCP-Cardiologist:  Kirk Ruths, MD  [facility]: Filbert Berthold PA-C   Reason for Consultation: Heart Failure   HPI:    Christopher Benton is seen today for evaluation of heart failure at the request of Dr Stanford Breed.  Mr Matsumura is a 78 year old with history of PAF , TTR amyloidosis (tamfamidis), severe LVH, and chronic systolic heart failure. In August 2017 he had stress test at the [facility] that showed EF 41% and no ischemia.   In February 2017 he had new onset A fib and underwent TEE/DC-CV. He was hospitalized in November 2017 with increased dyspnea, A flutter, and pleural effusion. He was placed on tikosyn with conversion to NSR.    Readmitted October 2019 with increased dyspnea and pleural effusion. PYP was suggestive TTR. He was diuresed with IV lasix and transitioned to lasix 80 mg as needed for 3 pound weight gain in 24-48 hours. He says he has been taking 40 mg po lasix daily.  Started taking tafimidis 2 weeks ago.   Admitted from cardiology office on 12/31 with increased shortness of breath and leg edema. He had 7 pounds weight. CXR showed right pleural effusion.  Pertinent admission labs included: K 4.5, creatinine 1.13, BNP 502, Hgb 16. He has been diuresing with 80 mg IV lasix twice a day. Weight has gone down from 253 in the office and today he is down to 241 pounds.  Creatinine trending up from 1.1>1.5.   Remains SOB with exertion.    PYP 03/11/2018  Visual and quantitative assessment (grade 3, H/CL equal 3.4) are strongly suggestive of transthyretin amyloidosis.  Echo 03/2018   Left ventricle: The cavity size was normal. There was severe   concentric hypertrophy. 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   Consider PYP scan. Systolic function was mildly reduced. The   estimated ejection fraction was in the range of 45% to 50%. There   is hypokinesis of the basalinferoseptal myocardium.  Features are   consistent with a pseudonormal left ventricular filling pattern,   with concomitant abnormal relaxation and increased filling   pressure (grade 2 diastolic dysfunction). - Aortic valve: Trileaflet; moderately thickened, moderately   calcified leaflets. There was trivial regurgitation. - Aorta: Ascending aortic diameter: 38 mm (S). - Ascending aorta: The ascending aorta was mildly dilated. - Left atrium: The atrium was moderately to severely dilated. - Right atrium: The atrium was severely dilated. - Tricuspid valve: There was mild regurgitation. - Pulmonary arteries: PA peak pressure: 40 mm Hg (S).  Review of Systems: [y] = yes, [ ]  = no   General: Weight gain [Y ]; Weight loss [ ] ; Anorexia [ ] ; Fatigue [ Y]; Fever [ ] ; Chills [ ] ; Weakness [ ]   Cardiac: Chest pain/pressure [ ] ; Resting SOB [ ] ; Exertional SOB [ Y]; Orthopnea [ Y]; Pedal Edema [ ] ; Palpitations [ ] ; Syncope [ ] ; Presyncope [ ] ; Paroxysmal nocturnal dyspnea[ ]   Pulmonary: Cough [ ] ; Wheezing[ ] ; Hemoptysis[ ] ; Sputum [ ] ; Snoring [ ]   GI: Vomiting[ ] ; Dysphagia[ ] ; Melena[ ] ; Hematochezia [ ] ; Heartburn[ ] ; Abdominal pain [ ] ; Constipation [ ] ; Diarrhea [ ] ; BRBPR [ ]   GU: Hematuria[ ] ; Dysuria [ ] ; Nocturia[ ]   Vascular: Pain in legs with walking [ ] ; Pain in feet with lying flat [ ] ; Non-healing sores [ ] ; Stroke [ ] ; TIA [ ] ; Slurred speech [ ] ;  Neuro: Headaches[ ] ; Vertigo[ ] ;  Seizures[ ] ; Paresthesias[ ] ;Blurred vision [ ] ; Diplopia [ ] ; Vision changes [ ]   Ortho/Skin: Arthritis [ ] ; Joint pain [Y]; Muscle pain [ ] ; Joint swelling [ ] ; Back Pain [Y ]; Rash [ ]   Psych: Depression[ ] ; Anxiety[ ]   Heme: Bleeding problems [ ] ; Clotting disorders [ ] ; Anemia [ ]   Endocrine: Diabetes [ ] ; Thyroid dysfunction[ ]   Home Medications Prior to Admission medications   Medication Sig Start Date End Date Taking? Authorizing Provider  apixaban (ELIQUIS) 5 MG TABS tablet Take 1 tablet (5 mg total) by mouth 2 (two)  times daily. 08/28/15  Yes Lelon Perla, MD  carvedilol (COREG) 12.5 MG tablet Take 1 tablet (12.5 mg total) by mouth 2 (two) times daily. 03/10/18  Yes Bufford Lope, DO  dofetilide (TIKOSYN) 500 MCG capsule Take 1 capsule (500 mcg total) by mouth 2 (two) times daily. 04/28/16  Yes Sherran Needs, NP  dorzolamide (TRUSOPT) 2 % ophthalmic solution Place 1 drop into both eyes 2 (two) times daily.   Yes [provider]  furosemide (LASIX) 40 MG tablet Take 2 tablets (80 mg total) by mouth daily as needed for fluid (for notice weight gain of >3 lbs in 24-48 hours and notify your doctor.). 03/10/18  Yes Orson Eva J, DO  latanoprost (XALATAN) 0.005 % ophthalmic solution Place 1 drop into both eyes at bedtime.   Yes [provider]  Multiple Vitamins-Minerals (MULTIVITAMIN WITH MINERALS) tablet Take 1 tablet by mouth daily.   Yes [provider]  potassium chloride (K-DUR,KLOR-CON) 10 MEQ tablet Take 1 tablet (10 mEq total) by mouth daily. 04/29/16  Yes Sherran Needs, NP  spironolactone (ALDACTONE) 25 MG tablet Take 0.5 tablets (12.5 mg total) by mouth daily. 03/11/18  Yes Orson Eva J, DO  Tafamidis 61 MG CAPS Take 61 mg by mouth daily.    Yes [provider]    Past Medical History: Past Medical History:  Diagnosis Date  . Arthritis    "left foot" (06/01/2018)  . Atrial fibrillation ([place])   . CAD (coronary artery disease)   . Cardiomyopathy ([place])   . Chronic systolic CHF (congestive heart failure) ([place]) 04/12/2016  . Dyspnea   . Dysrhythmia Atrial flutter  . 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 Hypertension     Past Surgical History: Past Surgical History:  Procedure Laterality Date  . CARDIOVERSION N/A 09/04/2015   Procedure: CARDIOVERSION;  Surgeon: Skeet Latch, MD;  Location: [place];  Service: Cardiovascular;  Laterality: N/A;  . TEE WITHOUT CARDIOVERSION N/A 09/04/2015   Procedure:  TRANSESOPHAGEAL ECHOCARDIOGRAM (TEE);  Surgeon: Skeet Latch, MD;  Location: [place];  Service: Cardiovascular;  Laterality: N/A;  . TEE WITHOUT CARDIOVERSION N/A 04/15/2016   Procedure: TRANSESOPHAGEAL ECHOCARDIOGRAM (TEE);  Surgeon: Jerline Pain, MD;  Location: [facility] ENDOSCOPY;  Service: Cardiovascular;  Laterality: N/A;  . TOE AMPUTATION Right    4th digit    Family History: Family History  Problem Relation Age of Onset  . Heart disease Other        No family history    Social History: Social History   Socioeconomic History  . Marital status: Married    Spouse name: Not on file  . Number of children: 2  . Years of education: Not on file  . Highest education level: Not on file  Occupational History  . Not on file  Social Needs  . Financial resource strain: Not on  file  . Food insecurity:    Worry: Not on file    Inability: Not on file  . Transportation needs:    Medical: Not on file    Non-medical: Not on file  Tobacco Use  . Smoking status: Former Smoker    Packs/day: 0.12    Types: Cigarettes    Last attempt to quit: 1990    Years since quitting: 30.0  . Smokeless tobacco: Never Used  . Tobacco comment: 06/01/2018 "smoked off and on for 30 years; don't know how many years I actually smoked"  Substance and Sexual Activity  . Alcohol use: Not Currently    Comment: 06/01/2018 "nothing in the last 10 years or more"  . Drug use: Never  . Sexual activity: Not on file  Lifestyle  . Physical activity:    Days per week: Not on file    Minutes per session: Not on file  . Stress: Not on file  Relationships  . Social connections:    Talks on phone: Not on file    Gets together: Not on file    Attends religious service: Not on file    Active member of club or organization: Not on file    Attends meetings of clubs or organizations: Not on file    Relationship status: Not on file  Other Topics Concern  . Not on file  Social History Narrative  . Not on file     Allergies:  Allergies  Allergen Reactions  . Pradaxa [Dabigatran Etexilate Mesylate] Nausea Only    Objective:    Vital Signs:   Temp:  [97.5 F (36.4 C)-98.2 F (36.8 C)] 97.8 F (36.6 C) (01/02 0338) Pulse Rate:  [55-86] 64 (01/02 0810) Resp:  [18-19] 18 (01/02 0338) BP: (84-117)/(54-78) 117/62 (01/02 0810) SpO2:  [90 %-94 %] 90 % (01/02 0338) Weight:  [109.6 kg-110 kg] 109.6 kg (01/02 0146) Last BM Date: 05/31/19  Weight change: Filed Weights   06/01/18 1639 06/02/18 0146  Weight: 110 kg 109.6 kg    Intake/Output:   Intake/Output Summary (Last 24 hours) at 06/02/2018 0814 Last data filed at 06/01/2018 2139 Gross per 24 hour  Intake 480 ml  Output 600 ml  Net -120 ml      Physical Exam    General:  Sitting on the side of the bed.  No resp difficulty HEENT: normal Neck: supple. JVP 9-10 . Carotids 2+ bilat; no bruits. No lymphadenopathy or thyromegaly appreciated. Cor: PMI nondisplaced. Regular rate & rhythm. No rubs, gallops or murmurs. Lungs: clear Decreased 1/2 up on right. On room air.  Abdomen: soft, nontender, nondistended. No hepatosplenomegaly. No bruits or masses. Good bowel sounds. Extremities: no cyanosis, clubbing, rash, R and LLE trace edema Neuro: alert & orientedx3, cranial nerves grossly intact. moves all 4 extremities w/o difficulty. Affect pleasant   Telemetry   SR 60-70s   EKG   SR PR 238 QT 472 QTc 494   Labs   Basic Metabolic Panel: Recent Labs  Lab 05/31/18 1333 06/01/18 0120 06/02/18 0402  NA 135 137 136  K 4.5 3.8 3.9  CL 100 101 99  CO2 24 24 27   GLUCOSE 113* 139* 102*  BUN 15 20 19   CREATININE 1.13 1.26* 1.59*  CALCIUM 9.6 9.4 9.4    Liver Function Tests: Recent Labs  Lab 05/31/18 1333  AST 27  ALT 15  ALKPHOS 72  BILITOT 3.9*  PROT 8.1  ALBUMIN 4.7   No results for input(s): LIPASE, AMYLASE in  the last 168 hours. No results for input(s): AMMONIA in the last 168 hours.  CBC: Recent Labs  Lab  05/31/18 1333  WBC 9.7  NEUTROABS 6.4  HGB 16.1  HCT 51.0  MCV 89.8  PLT 202    Cardiac Enzymes: Recent Labs  Lab 05/31/18 1333  TROPONINI 0.09*    BNP: BNP (last 3 results) Recent Labs    03/08/18 1454 05/31/18 1333  BNP 716.6* 502.8*    ProBNP (last 3 results) No results for input(s): PROBNP in the last 8760 hours.   CBG: No results for input(s): GLUCAP in the last 168 hours.  Coagulation Studies: No results for input(s): LABPROT, INR in the last 72 hours.   Imaging    No results found.   Medications:     Current Medications: . carvedilol  12.5 mg Oral BID WC  . dofetilide  500 mcg Oral BID  . dorzolamide  1 drop Both Eyes BID  . furosemide  80 mg Intravenous BID  . latanoprost  1 drop Both Eyes QHS  . potassium chloride  10 mEq Oral BID  . sodium chloride flush  3 mL Intravenous Q12H  . spironolactone  12.5 mg Oral Daily  . Tafamidis  61 mg Oral Daily     Infusions: . sodium chloride         Patient Profile  Mr Moncus is a 78 year old with history of PAF , TTR amyloidosis (tamfamidis), severe LVH, and chronic systolic heart failure. In August 2017 he had stress test at the [facility] that showed EF 41% and no ischemia.   Admitted with increased dyspnea and volume overload.    Assessment/Plan   1. 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 Continue tafamidis 61 mg daily.  He has been diuresing with IV lasix. Weight has gone done but still with some volume overload.  Renal function is getting worse so I will hold lasix. Repeat 2VCXR. May benefit from [place] to further evaluate hemodynamics.  I briefly discussed RHC.  - Continue coreg 12.5 mg twice a day - Continue spiro 12.5 mg daily.  - He refuses to take losartan.   2. Right Pleural Effusion  Recurrent thoracentesis. Had thoracentesis in 2017.  On admit he has moderate-large pleural effusion.  Repeat 2 view cxr. May need  thoracentesis. Holding eliquis.   3. PAF Maintaining NSR.  Continue tikosyn at current dose. If creatinine worsens will need reduce the dose.  Eliquis on hold. Will need to restart if he doesn't need thoracentesis.    4. AKI  Creatinine baseline 1.1  Creatinine trending up from 1.1>1.56.  5. Amyloidosis TTR, on tafamidis daily.    Medication concerns reviewed with patient and pharmacy team. Barriers identified: na/  Length of Stay: 2  Darrick Grinder, NP  06/02/2018, 8:14 AM  Advanced Heart Failure Team Pager [phone] (M-F; 7a - 4p)  Please contact [facility] for night-coverage after hours (4p -7a ) and weekends on [email or web address]  CXR with large right pleural effusion. Consult IR for thoracentesis. ELiquis has been on hold.  Amy Clegg   Patient seen with NP, agree with the above note.    He has diuresed some in the hospital so far but creatinine up to 1.59.  He has a large right pleural effusion.  He remains in NSR.   On exam, JVP 16 cm.  Decreased breath sounds on right.  Regular S1S2.  2+ edema 1/2 to  knees bilaterally.   1. 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  Echo (10/19) with severe LVH, EF 45-50%.  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  He is on tafamidis (x about 2 wks).  He has had progressive dyspnea and volume overload, admitted from Dr. [name] office.  He has diuresed some here in the hospital but creatinine has trended up to 1.59.  On exam, he remains significantly volume overloaded.  - Given marked volume overload, I am going to continue IV Lasix 80 mg bid for now, hopefully will fall in renal venous pressure creatinine will come down.   - If renal function worsens, he may need RHC.  - Continue tafamidis.  - Continue current Coreg and spironolactone.  - No ACEI/ARB/ARNI with AKI.  2. Pleural effusion: Large, on right.  Had thoracentesis on right in 2017 as well.  Suspect transudative from CHF.  - He will have IR-guided thoracentesis  today, apixaban held.  - Send fluid for cytology, culture, LDH, protein.  3. Atrial fibrillation: Paroxysmal.  He is in NSR today.  QTc acceptable on ECG on Tikosyn.  - Continue Tikosyn.  - Eliquis held for thoracentesis, restart afterwards.  4. AKI: Creatinine up to 1.59 with diuresis. 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  This may make volume difficult to manage.  Will continue diuretics for now and hope that creatinine will trend down as renal venous pressure drops.   Loralie Champagne 06/02/2018 2:59 PM

## 2018-06-02 NOTE — Progress Notes (Signed)
Pt returned from IR on 8L oxygen. RN placed pt on 2L nasal cannula; pt SpO2= 94% on this.

## 2018-06-02 NOTE — Plan of Care (Signed)
  Problem: Education: Goal: Ability to demonstrate management of disease process will improve Outcome: Progressing Goal: Ability to verbalize understanding of medication therapies will improve Outcome: Progressing   

## 2018-06-02 NOTE — Procedures (Signed)
PROCEDURE SUMMARY:  Successful image-guided right thoracentesis. Yielded 2.1 liters of clear yellow fluid. Procedure was stopped due to patient complaints of chest pain and coughing - O2 noted to be 88%, placed on 4L O2 initially via [place] without improvement, increased to 6 L O2 and simple mask applied which improved O2 to 95%. Patient transported back to floor in stable condition and resolution of earlier complaints.  EBL: Zero No immediate complications.  Specimen was sent for labs. Post procedure CXR shows no pneumothorax.  Please see imaging section of Epic for full dictation.  Joaquim Nam PA-C 06/02/2018 4:19 PM

## 2018-06-02 NOTE — Plan of Care (Signed)
  Problem: Education: Goal: Knowledge of General Education information will improve Description Including pain rating scale, medication(s)/side effects and non-pharmacologic comfort measures Outcome: Progressing   Problem: Education: Goal: Ability to demonstrate management of disease process will improve Outcome: Progressing Goal: Ability to verbalize understanding of medication therapies will improve Outcome: Progressing   Problem: Activity: Goal: Capacity to carry out activities will improve Outcome: Progressing   Problem: Cardiac: Goal: Ability to achieve and maintain adequate cardiopulmonary perfusion will improve Outcome: Progressing

## 2018-06-03 ENCOUNTER — Inpatient Hospital Stay (HOSPITAL_COMMUNITY): Payer: No Typology Code available for payment source

## 2018-06-03 DIAGNOSIS — E8582 Wild-type transthyretin-related (ATTR) amyloidosis: Secondary | ICD-10-CM

## 2018-06-03 DIAGNOSIS — N179 Acute kidney failure, unspecified: Secondary | ICD-10-CM

## 2018-06-03 LAB — CBC
HCT: 45.9 % (ref 39.0–52.0)
Hemoglobin: 14.5 g/dL (ref 13.0–17.0)
MCH: 27.4 pg (ref 26.0–34.0)
MCHC: 31.6 g/dL (ref 30.0–36.0)
MCV: 86.6 fL (ref 80.0–100.0)
Platelets: 188 10*3/uL (ref 150–400)
RBC: 5.3 MIL/uL (ref 4.22–5.81)
RDW: 13.1 % (ref 11.5–15.5)
WBC: 9.5 10*3/uL (ref 4.0–10.5)
nRBC: 0 % (ref 0.0–0.2)

## 2018-06-03 LAB — BASIC METABOLIC PANEL
ANION GAP: 12 (ref 5–15)
BUN: 23 mg/dL (ref 8–23)
CO2: 23 mmol/L (ref 22–32)
Calcium: 9.2 mg/dL (ref 8.9–10.3)
Chloride: 101 mmol/L (ref 98–111)
Creatinine, Ser: 1.33 mg/dL — ABNORMAL HIGH (ref 0.61–1.24)
GFR calc Af Amer: 59 mL/min — ABNORMAL LOW (ref >60)
GFR calc non Af Amer: 51 mL/min — ABNORMAL LOW (ref >60)
Glucose, Bld: 112 mg/dL — ABNORMAL HIGH (ref 70–99)
Potassium: 4.3 mmol/L (ref 3.5–5.1)
Sodium: 136 mmol/L (ref 135–145)

## 2018-06-03 LAB — MAGNESIUM: Magnesium: 1.9 mg/dL (ref 1.7–2.4)

## 2018-06-03 MED ORDER — METOLAZONE 2.5 MG PO TABS
2.5000 mg | ORAL_TABLET | Freq: Once | ORAL | Status: AC
  Administered 2018-06-03: 2.5 mg via ORAL
  Filled 2018-06-03: qty 1

## 2018-06-03 MED ORDER — MAGNESIUM SULFATE IN D5W 1-5 GM/100ML-% IV SOLN
1.0000 g | Freq: Once | INTRAVENOUS | Status: AC
  Administered 2018-06-03: 1 g via INTRAVENOUS
  Filled 2018-06-03: qty 100

## 2018-06-03 MED ORDER — APIXABAN 5 MG PO TABS
5.0000 mg | ORAL_TABLET | Freq: Two times a day (BID) | ORAL | Status: DC
  Administered 2018-06-03 – 2018-06-07 (×8): 5 mg via ORAL
  Filled 2018-06-03 (×8): qty 1

## 2018-06-03 NOTE — Plan of Care (Signed)
  Problem: Education: Goal: Knowledge of General Education information will improve Description: Including pain rating scale, medication(s)/side effects and non-pharmacologic comfort measures Outcome: Progressing   Problem: Education: Goal: Ability to demonstrate management of disease process will improve Outcome: Progressing   

## 2018-06-03 NOTE — Care Management Important Message (Signed)
Important Message  Patient Details  Name: Christopher Benton MRN: [id number] Date of Birth: [birth year]   Medicare Important Message Given:  Yes    [name] [place] 06/03/2018, 4:46 PM

## 2018-06-03 NOTE — Progress Notes (Addendum)
Advanced Heart Failure Rounding Note  PCP-Cardiologist: Kirk Ruths, MD   Subjective:    Had IR guided thoracentesis yesterday with 2 L clear yellow fluid off. Diuresed with 80 mg IV lasix BID with sluggish UOP. Only net negative 845 mls. Weight down 10 lbs. Creatinine improving 1.59 > 1.33 today. SBP 90-110s  SOB much improved after thoracentesis. Denies CP. Has a cough and had some rust colored sputum overnight. Eliquis still on hold. CBC pending.   Objective:   Weight Range: 104.9 kg Body mass index is 32.26 kg/m.   Vital Signs:   Temp:  [98.4 F (36.9 C)-99.2 F (37.3 C)] 98.4 F (36.9 C) (01/03 0500) Pulse Rate:  [62-68] 66 (01/03 0500) Resp:  [18-20] 18 (01/03 0500) BP: (98-118)/(61-83) 99/76 (01/03 0500) SpO2:  [92 %-97 %] 96 % (01/03 0500) Weight:  [104.9 kg] 104.9 kg (01/03 0456) Last BM Date: 05/30/18  Weight change: Filed Weights   06/01/18 1639 06/02/18 0146 06/03/18 0456  Weight: 110 kg 109.6 kg 104.9 kg    Intake/Output:   Intake/Output Summary (Last 24 hours) at 06/03/2018 0803 Last data filed at 06/03/2018 0200 Gross per 24 hour  Intake 480 ml  Output 1325 ml  Net -845 ml      Physical Exam    General:  No resp difficulty. HEENT: Normal Neck: Supple. JVP to jaw. Carotids 2+ bilat; no bruits. No lymphadenopathy or thyromegaly appreciated. Cor: PMI nondisplaced. Regular rate & rhythm. No rubs, gallops or murmurs. Lungs: slightly diminished RLL. Abdomen: Soft, nontender, nondistended. No hepatosplenomegaly. No bruits or masses. Good bowel sounds. Extremities: No cyanosis, clubbing, rash, BLE 1+ edema Neuro: Alert & orientedx3, cranial nerves grossly intact. moves all 4 extremities w/o difficulty. Affect pleasant   Telemetry   NSR 60s with sinus arrhythmia. Personally reviewed.   EKG    NSR 62 bpm with 1st degree AV block and prolonged QTc 501 ms. Personally reviewed.   Labs    CBC Recent Labs    05/31/18 1333  WBC 9.7    NEUTROABS 6.4  HGB 16.1  HCT 51.0  MCV 89.8  PLT 009   Basic Metabolic Panel Recent Labs    06/02/18 0402 06/03/18 0519  NA 136 136  K 3.9 4.3  CL 99 101  CO2 27 23  GLUCOSE 102* 112*  BUN 19 23  CREATININE 1.59* 1.33*  CALCIUM 9.4 9.2  MG  --  1.9   Liver Function Tests Recent Labs    05/31/18 1333 06/02/18 1809  AST 27 25  ALT 15 14  ALKPHOS 72 72  BILITOT 3.9* 3.4*  PROT 8.1 7.8  ALBUMIN 4.7 4.5   No results for input(s): LIPASE, AMYLASE in the last 72 hours. Cardiac Enzymes Recent Labs    05/31/18 1333  TROPONINI 0.09*    BNP: BNP (last 3 results) Recent Labs    03/08/18 1454 05/31/18 1333  BNP 716.6* 502.8*    ProBNP (last 3 results) No results for input(s): PROBNP in the last 8760 hours.   D-Dimer No results for input(s): DDIMER in the last 72 hours. Hemoglobin A1C No results for input(s): HGBA1C in the last 72 hours. Fasting Lipid Panel No results for input(s): CHOL, HDL, LDLCALC, TRIG, CHOLHDL, LDLDIRECT in the last 72 hours. Thyroid Function Tests No results for input(s): TSH, T4TOTAL, T3FREE, THYROIDAB in the last 72 hours.  Invalid input(s): FREET3  Other results:   Imaging    Dg Chest 2 View  Result Date: 06/02/2018 CLINICAL DATA:  Pleural effusion. EXAM: CHEST - 2 VIEW COMPARISON:  05/31/2018 FINDINGS: The right heart border remains obscured with unchanged appearance of the visualized cardiomediastinal contours. A large right pleural effusion is unchanged with with associated right middle and lower lobe atelectasis or consolidation. There is a persistent small left pleural effusion with minimal left basilar opacity likely reflecting atelectasis. No pneumothorax is identified. No acute osseous abnormality is seen. IMPRESSION: Unchanged large right and small left pleural effusions with right basilar atelectasis or consolidation. Electronically Signed   By: Logan Bores M.D.   On: 06/02/2018 10:58   Dg Chest Port 1 View  Result  Date: 06/02/2018 CLINICAL DATA:  Thoracentesis EXAM: PORTABLE CHEST 1 VIEW COMPARISON:  06/02/2018 FINDINGS: Significantly decreased right pleural effusion with no evident re-expansion edema or pneumothorax. Comparatively clear left chest. Chronic cardiomegaly. IMPRESSION: No evident complication after right thoracentesis. Small to moderate residual fluid remains. Electronically Signed   By: Monte Fantasia M.D.   On: 06/02/2018 15:52   Ir Thoracentesis Asp Pleural Space W/img Guide  Result Date: 06/02/2018 Lynnea Ferrier     06/02/2018  4:21 PM PROCEDURE SUMMARY: Successful image-guided right thoracentesis. Yielded 2.1 liters of clear yellow fluid. Procedure was stopped due to patient complaints of chest pain and coughing - O2 noted to be 88%, placed on 4L O2 initially via [place] without improvement, increased to 6 L O2 and simple mask applied which improved O2 to 95%. Patient transported back to floor in stable condition and resolution of earlier complaints. EBL: Zero No immediate complications. Specimen was sent for labs. Post procedure CXR shows no pneumothorax. Please see imaging section of Epic for full dictation. Joaquim Nam PA-C 06/02/2018 4:19 PM      Medications:     Scheduled Medications: . carvedilol  12.5 mg Oral BID WC  . dofetilide  500 mcg Oral BID  . dorzolamide  1 drop Both Eyes BID  . furosemide  80 mg Intravenous BID  . latanoprost  1 drop Both Eyes QHS  . potassium chloride  10 mEq Oral BID  . sodium chloride flush  3 mL Intravenous Q12H  . spironolactone  12.5 mg Oral Daily  . Tafamidis  61 mg Oral Daily     Infusions: . sodium chloride       PRN Medications:  sodium chloride, acetaminophen, ALPRAZolam, benzonatate, docusate sodium, guaiFENesin-dextromethorphan, lidocaine, magnesium hydroxide, nitroGLYCERIN, ondansetron (ZOFRAN) IV, zolpidem    Patient Profile   Christopher Benton is a 78 year old with history of PAF , TTR amyloidosis (tamfamidis),  severe LVH, and chronic systolic heart failure. In August 2017 he had stress test at the [facility] that showed EF 41% and no ischemia.   Admitted with increased dyspnea and volume overload.   Assessment/Plan   1. 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 Continue tafamidis 61 mg daily.  - Volume remains elevated. - Continue 80 mg IV lasix BID. Creatinine improving with diuresis.  - May benefit from [place] to further evaluate hemodynamics. - Continue coreg 12.5 mg twice a day - Continue spiro 12.5 mg daily.  - He refuses to take losartan.  - Add TED hose.  2. Right Pleural Effusion  - Recurrent pleural effusion. Had thoracentesis in 2017.  - S/p thoracentesis 1/2 with 2 L clear yellow fluid. Eliquis still on hold.  - Culture NGTD, LDH 180, protein 3.7  3. PAF - Maintaining NSR.  - Continue tikosyn at current  dose. If creatinine worsens will need reduce the dose.  - Eliquis still on hold for thoracentesis. Now having hemoptysis.   4. AKI  Creatinine baseline 1.1  - Improved with diuresis. Creatinine 1.33 today. Monitor closely.   5. Amyloidosis - TTR, on tafamidis daily.  No change  6. Hemoptysis - Check CBC. Likely due to fluid overload. Eliquis has been held for several doses. Will plan to resume if stable.   Medication concerns reviewed with patient and pharmacy team. Barriers identified: none   Length of Stay: [place], NP  06/03/2018, 8:03 AM  Advanced Heart Failure Team Pager [phone] (M-F; 7a - 4p)  Please contact [facility] for night-coverage after hours (4p -7a ) and weekends on [email or web address]  Patient seen with NP, agree with the above note.    He had thoracentesis yesterday with 2 L off, transudative by Light's criteria.  Weight is down.  Creatinine lower on IV Lasix.   On exam, JVP still 14-16 cm.  Decreased BS on right.  1+ edema 1/2 to knees bilaterally.  Regular S1S2.   He  remains volume overloaded but creatinine lower today with diuresis.   - Continue to push diuresis.  Will give Lasix 80 mg IV bid today with a dose of metolazone 2.5 x 1. I told him that I expect he will need a couple more days of diuresis before discharge.   Loralie Champagne 06/03/2018 10:47 AM

## 2018-06-04 LAB — CBC
HEMATOCRIT: 43.9 % (ref 39.0–52.0)
Hemoglobin: 14.3 g/dL (ref 13.0–17.0)
MCH: 28.4 pg (ref 26.0–34.0)
MCHC: 32.6 g/dL (ref 30.0–36.0)
MCV: 87.3 fL (ref 80.0–100.0)
Platelets: 178 10*3/uL (ref 150–400)
RBC: 5.03 MIL/uL (ref 4.22–5.81)
RDW: 13.1 % (ref 11.5–15.5)
WBC: 9.2 10*3/uL (ref 4.0–10.5)
nRBC: 0 % (ref 0.0–0.2)

## 2018-06-04 LAB — BASIC METABOLIC PANEL
Anion gap: 10 (ref 5–15)
BUN: 25 mg/dL — ABNORMAL HIGH (ref 8–23)
CHLORIDE: 97 mmol/L — AB (ref 98–111)
CO2: 28 mmol/L (ref 22–32)
Calcium: 9.4 mg/dL (ref 8.9–10.3)
Creatinine, Ser: 1.48 mg/dL — ABNORMAL HIGH (ref 0.61–1.24)
GFR calc Af Amer: 52 mL/min — ABNORMAL LOW (ref >60)
GFR calc non Af Amer: 45 mL/min — ABNORMAL LOW (ref >60)
Glucose, Bld: 100 mg/dL — ABNORMAL HIGH (ref 70–99)
Potassium: 3.6 mmol/L (ref 3.5–5.1)
Sodium: 135 mmol/L (ref 135–145)

## 2018-06-04 LAB — MAGNESIUM: MAGNESIUM: 2.1 mg/dL (ref 1.7–2.4)

## 2018-06-04 MED ORDER — DOFETILIDE 250 MCG PO CAPS
250.0000 ug | ORAL_CAPSULE | Freq: Two times a day (BID) | ORAL | Status: DC
  Administered 2018-06-05 (×2): 250 ug via ORAL
  Filled 2018-06-04 (×3): qty 1

## 2018-06-04 MED ORDER — METOLAZONE 5 MG PO TABS
5.0000 mg | ORAL_TABLET | Freq: Once | ORAL | Status: AC
  Administered 2018-06-04: 5 mg via ORAL
  Filled 2018-06-04: qty 1

## 2018-06-04 MED ORDER — POTASSIUM CHLORIDE CRYS ER 20 MEQ PO TBCR
40.0000 meq | EXTENDED_RELEASE_TABLET | Freq: Once | ORAL | Status: AC
  Administered 2018-06-04: 40 meq via ORAL
  Filled 2018-06-04: qty 4

## 2018-06-04 NOTE — Progress Notes (Signed)
Patient ID: Christopher Benton, male   DOB: 1941/02/08, 78 y.o.   MRN: [id number]     Advanced Heart Failure Rounding Note  PCP-Cardiologist: Christopher Ruths, MD   Subjective:    Had IR guided thoracentesis 1/2 with 2 L clear yellow fluid off, transudative.   Good UOP yesterday with addition of metolazone.  Creatinine 1.33 => 1.48.    Breathing is improving.   QTc prolonged to 538 on ECG today.    Objective:   Weight Range: 104.4 kg Body mass index is 32.11 kg/m.   Vital Signs:   Temp:  [98 F (36.7 C)-98.7 F (37.1 C)] 98 F (36.7 C) (01/04 0527) Pulse Rate:  [58-64] 64 (01/04 0910) Resp:  [16-18] 18 (01/04 0527) BP: (90-101)/(59-75) 101/64 (01/04 0910) SpO2:  [93 %] 93 % (01/04 0527) Weight:  [104.4 kg] 104.4 kg (01/04 0527) Last BM Date: 05/31/18  Weight change: Filed Weights   06/02/18 0146 06/03/18 0456 06/04/18 0527  Weight: 109.6 kg 104.9 kg 104.4 kg    Intake/Output:   Intake/Output Summary (Last 24 hours) at 06/04/2018 1201 Last data filed at 06/04/2018 0951 Gross per 24 hour  Intake 1080 ml  Output 2225 ml  Net -1145 ml      Physical Exam    General: NAD Neck: JVP 16 cm, no thyromegaly or thyroid nodule.  Lungs: Clear to auscultation bilaterally with normal respiratory effort. CV: Nondisplaced PMI.  Heart regular S1/S2, no S3/S4, no murmur.  1+ ankle edema.   Abdomen: Soft, nontender, no hepatosplenomegaly, no distention.  Skin: Intact without lesions or rashes.  Neurologic: Alert and oriented x 3.  Psych: Normal affect. Extremities: No clubbing or cyanosis.  HEENT: Normal.    Telemetry   NSR 60s. Personally reviewed.   EKG    NSR with 1st degree AV block and prolonged QTc 538 msec. Personally reviewed.   Labs    CBC Recent Labs    06/03/18 0824 06/04/18 0450  WBC 9.5 9.2  HGB 14.5 14.3  HCT 45.9 43.9  MCV 86.6 87.3  PLT 188 601   Basic Metabolic Panel Recent Labs    06/03/18 0519 06/04/18 0450  NA 136 135  K 4.3 3.6  CL  101 97*  CO2 23 28  GLUCOSE 112* 100*  BUN 23 25*  CREATININE 1.33* 1.48*  CALCIUM 9.2 9.4  MG 1.9 2.1   Liver Function Tests Recent Labs    06/02/18 1809  AST 25  ALT 14  ALKPHOS 72  BILITOT 3.4*  PROT 7.8  ALBUMIN 4.5   No results for input(s): LIPASE, AMYLASE in the last 72 hours. Cardiac Enzymes No results for input(s): CKTOTAL, CKMB, CKMBINDEX, TROPONINI in the last 72 hours.  BNP: BNP (last 3 results) Recent Labs    03/08/18 1454 05/31/18 1333  BNP 716.6* 502.8*    ProBNP (last 3 results) No results for input(s): PROBNP in the last 8760 hours.   D-Dimer No results for input(s): DDIMER in the last 72 hours. Hemoglobin A1C No results for input(s): HGBA1C in the last 72 hours. Fasting Lipid Panel No results for input(s): CHOL, HDL, LDLCALC, TRIG, CHOLHDL, LDLDIRECT in the last 72 hours. Thyroid Function Tests No results for input(s): TSH, T4TOTAL, T3FREE, THYROIDAB in the last 72 hours.  Invalid input(s): FREET3  Other results:   Imaging    No results found.   Medications:     Scheduled Medications: . apixaban  5 mg Oral BID  . carvedilol  12.5 mg Oral BID  WC  . dofetilide  250 mcg Oral BID  . dorzolamide  1 drop Both Eyes BID  . furosemide  80 mg Intravenous BID  . latanoprost  1 drop Both Eyes QHS  . potassium chloride  10 mEq Oral BID  . potassium chloride  40 mEq Oral Once  . sodium chloride flush  3 mL Intravenous Q12H  . spironolactone  12.5 mg Oral Daily  . Tafamidis  61 mg Oral Daily    Infusions: . sodium chloride      PRN Medications: sodium chloride, acetaminophen, ALPRAZolam, benzonatate, docusate sodium, guaiFENesin-dextromethorphan, lidocaine, magnesium hydroxide, nitroGLYCERIN, ondansetron (ZOFRAN) IV, zolpidem    Patient Profile   Christopher Benton is a 78 year old with history of PAF , TTR amyloidosis (tamfamidis), severe LVH, and chronic systolic heart failure. In August 2017 he had stress test at the [facility] that showed  EF 41% and no ischemia.   Admitted with increased dyspnea and volume overload.   Assessment/Plan   1. 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  Echo (10/19) with severe LVH, EF 45-50%.  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  He is on tafamidis (x about 2 wks).  He has had progressive dyspnea and volume overload, admitted from Dr. Jacalyn Benton office.  He has diuresed some in hospital but still volume overloaded.  Creatinine remaining fairly stable, 1.48 today.   - Continue Lasix 80 mg IV bid and will give a dose of metolazone 5 mg x 1 today.  Replace K.    - If renal function worsens, he may need RHC.  - Continue tafamidis.  - Continue current Coreg and spironolactone.  - No ACEI/ARB/ARNI with AKI.  2. Pleural effusion: Had thoracentesis on right in 2017.  Thoracentesis 1/2 with 2 L off.  Transudative from CHF.  3. Atrial fibrillation: Paroxysmal.  He is in NSR today.  QTc 538 today, was borderline yesterday at 502 msec.  - Decrease Tikosyn to 250 mg bid.  - Continue Eliquis 5 mg bid.  4. 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  This may make volume difficult to manage.  Will continue diuretics for now and hope that creatinine will trend down as renal venous pressure drops. Creatinine mildly higher at 1.48 today.   Christopher Benton 06/04/2018 12:01 PM

## 2018-06-04 NOTE — Progress Notes (Signed)
EKG was done to calculate QTc prior to Tikosyn 250 mcg capsule administration. QTc result of: 502.  Per medication parameters, hold dose and [facility] MD paged.  No new orders at this time.  EKG placed in pts chart.

## 2018-06-05 LAB — CBC
HCT: 47.6 % (ref 39.0–52.0)
Hemoglobin: 15.3 g/dL (ref 13.0–17.0)
MCH: 27.6 pg (ref 26.0–34.0)
MCHC: 32.1 g/dL (ref 30.0–36.0)
MCV: 85.9 fL (ref 80.0–100.0)
Platelets: 192 10*3/uL (ref 150–400)
RBC: 5.54 MIL/uL (ref 4.22–5.81)
RDW: 12.9 % (ref 11.5–15.5)
WBC: 10.2 10*3/uL (ref 4.0–10.5)
nRBC: 0 % (ref 0.0–0.2)

## 2018-06-05 LAB — MAGNESIUM: MAGNESIUM: 2.1 mg/dL (ref 1.7–2.4)

## 2018-06-05 LAB — BASIC METABOLIC PANEL
Anion gap: 13 (ref 5–15)
BUN: 27 mg/dL — ABNORMAL HIGH (ref 8–23)
CO2: 27 mmol/L (ref 22–32)
Calcium: 9.6 mg/dL (ref 8.9–10.3)
Chloride: 93 mmol/L — ABNORMAL LOW (ref 98–111)
Creatinine, Ser: 1.59 mg/dL — ABNORMAL HIGH (ref 0.61–1.24)
GFR calc Af Amer: 48 mL/min — ABNORMAL LOW (ref >60)
GFR calc non Af Amer: 41 mL/min — ABNORMAL LOW (ref >60)
GLUCOSE: 95 mg/dL (ref 70–99)
Potassium: 3.6 mmol/L (ref 3.5–5.1)
Sodium: 133 mmol/L — ABNORMAL LOW (ref 135–145)

## 2018-06-05 MED ORDER — METOLAZONE 5 MG PO TABS
5.0000 mg | ORAL_TABLET | Freq: Once | ORAL | Status: AC
  Administered 2018-06-05: 5 mg via ORAL
  Filled 2018-06-05: qty 1

## 2018-06-05 MED ORDER — POTASSIUM CHLORIDE CRYS ER 20 MEQ PO TBCR
40.0000 meq | EXTENDED_RELEASE_TABLET | Freq: Once | ORAL | Status: AC
  Administered 2018-06-05: 40 meq via ORAL
  Filled 2018-06-05: qty 2

## 2018-06-05 MED ORDER — POLYETHYLENE GLYCOL 3350 17 G PO PACK
17.0000 g | PACK | Freq: Every day | ORAL | Status: DC
  Filled 2018-06-05 (×3): qty 1

## 2018-06-05 MED ORDER — CARVEDILOL 6.25 MG PO TABS: 6.2500 mg | ORAL_TABLET | Freq: Two times a day (BID) | ORAL | Status: DC

## 2018-06-05 MED ORDER — CARVEDILOL 3.125 MG PO TABS
3.1250 mg | ORAL_TABLET | Freq: Two times a day (BID) | ORAL | Status: DC
  Administered 2018-06-05 – 2018-06-07 (×4): 3.125 mg via ORAL
  Filled 2018-06-05 (×5): qty 1

## 2018-06-05 NOTE — Progress Notes (Signed)
Patient ID: Christopher Benton, male   DOB: 1940/11/25, 78 y.o.   MRN: [id number] Patient ID: Christopher Benton, male   DOB: [birth year], 78 y.o.   MRN: [id number]     Advanced Heart Failure Rounding Note  PCP-Cardiologist: Christopher Ruths, MD   Subjective:    Had IR guided thoracentesis 1/2 with 2 L clear yellow fluid off, transudative.   Good UOP yesterday with addition of metolazone.  Creatinine 1.33 => 1.48 => 1.59.    Breathing is improving.   QTc decreased at 481 msec on ECG today after decreasing Tikosyn.    Objective:   Weight Range: 101 kg Body mass index is 31.05 kg/m.   Vital Signs:   Temp:  [98 F (36.7 C)-98.2 F (36.8 C)] 98.1 F (36.7 C) (01/05 0601) Pulse Rate:  [54-67] 54 (01/05 0957) Resp:  [16-20] 20 (01/05 0601) BP: (95-107)/(62-74) 107/73 (01/05 0957) SpO2:  [93 %-94 %] 93 % (01/05 0601) Weight:  [101 kg] 101 kg (01/05 0601) Last BM Date: 05/31/18  Weight change: Filed Weights   06/03/18 0456 06/04/18 0527 06/05/18 0601  Weight: 104.9 kg 104.4 kg 101 kg    Intake/Output:   Intake/Output Summary (Last 24 hours) at 06/05/2018 1033 Last data filed at 06/05/2018 0936 Gross per 24 hour  Intake 803 ml  Output 3700 ml  Net -2897 ml      Physical Exam    General: NAD Neck: JVP 14-16, no thyromegaly or thyroid nodule.  Lungs: Clear to auscultation bilaterally with normal respiratory effort. CV: Nondisplaced PMI.  Heart regular S1/S2, no S3/S4, no murmur.  Trace ankle edema.   Abdomen: Soft, nontender, no hepatosplenomegaly, no distention.  Skin: Intact without lesions or rashes.  Neurologic: Alert and oriented x 3.  Psych: Normal affect. Extremities: No clubbing or cyanosis.  HEENT: Normal.    Telemetry   NSR 60s. Personally reviewed.   EKG    NSR with 1st degree AV block and prolonged QTc 481 msec. Personally reviewed.   Labs    CBC Recent Labs    06/04/18 0450 06/05/18 0424  WBC 9.2 10.2  HGB 14.3 15.3  HCT 43.9 47.6  MCV 87.3 85.9   PLT 178 322   Basic Metabolic Panel Recent Labs    06/04/18 0450 06/05/18 0424  NA 135 133*  K 3.6 3.6  CL 97* 93*  CO2 28 27  GLUCOSE 100* 95  BUN 25* 27*  CREATININE 1.48* 1.59*  CALCIUM 9.4 9.6  MG 2.1 2.1   Liver Function Tests Recent Labs    06/02/18 1809  AST 25  ALT 14  ALKPHOS 72  BILITOT 3.4*  PROT 7.8  ALBUMIN 4.5   No results for input(s): LIPASE, AMYLASE in the last 72 hours. Cardiac Enzymes No results for input(s): CKTOTAL, CKMB, CKMBINDEX, TROPONINI in the last 72 hours.  BNP: BNP (last 3 results) Recent Labs    03/08/18 1454 05/31/18 1333  BNP 716.6* 502.8*    ProBNP (last 3 results) No results for input(s): PROBNP in the last 8760 hours.   D-Dimer No results for input(s): DDIMER in the last 72 hours. Hemoglobin A1C No results for input(s): HGBA1C in the last 72 hours. Fasting Lipid Panel No results for input(s): CHOL, HDL, LDLCALC, TRIG, CHOLHDL, LDLDIRECT in the last 72 hours. Thyroid Function Tests No results for input(s): TSH, T4TOTAL, T3FREE, THYROIDAB in the last 72 hours.  Invalid input(s): FREET3  Other results:   Imaging    No results found.   Medications:  Scheduled Medications: . apixaban  5 mg Oral BID  . carvedilol  3.125 mg Oral BID WC  . dofetilide  250 mcg Oral BID  . dorzolamide  1 drop Both Eyes BID  . furosemide  80 mg Intravenous BID  . latanoprost  1 drop Both Eyes QHS  . metolazone  5 mg Oral Once  . potassium chloride  10 mEq Oral BID  . potassium chloride  40 mEq Oral Once  . sodium chloride flush  3 mL Intravenous Q12H  . spironolactone  12.5 mg Oral Daily  . Tafamidis  61 mg Oral Daily    Infusions: . sodium chloride      PRN Medications: sodium chloride, acetaminophen, ALPRAZolam, benzonatate, docusate sodium, guaiFENesin-dextromethorphan, lidocaine, magnesium hydroxide, nitroGLYCERIN, ondansetron (ZOFRAN) IV, zolpidem    Patient Profile   Christopher Benton is a 78 year old with  history of PAF , TTR amyloidosis (tamfamidis), severe LVH, and chronic systolic heart failure. In August 2017 he had stress test at the [facility] that showed EF 41% and no ischemia.   Admitted with increased dyspnea and volume overload.   Assessment/Plan   1. 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  Echo (10/19) with severe LVH, EF 45-50%.  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  He is on tafamidis (x about 2 wks).  He has had progressive dyspnea and volume overload, admitted from Christopher Benton office.  He is diuresing with IV Lasix and had a dose of metolazone yesterday.  Creatinine remaining fairly stable, 1.59 today.   - Continue Lasix 80 mg IV bid and will give a dose of metolazone 5 mg x 1 today.  Replace K.    - If renal function worsens, he may need RHC.  - Continue tafamidis.  - Continue current Coreg and spironolactone.  - No ACEI/ARB/ARNI with AKI.  - I think that he would be a good candidate for Cardiomems in the future.  We discussed this today, can discuss further as outpatient.  2. Pleural effusion: Had thoracentesis on right in 2017.  Thoracentesis 1/2 with 2 L off.  Transudative from CHF.  3. Atrial fibrillation: Paroxysmal.  He is in NSR today.  QTc 481 today after decreasing Tikosyn yesterday.  - Continue Tikosyn at lower dose 250 mg bid. Replace K and Mg.  - Continue Eliquis 5 mg bid.  4. 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  This may make volume difficult to manage.  Will continue diuretics for now.  Creatinine mildly higher at 1.59 today.   Loralie Champagne 06/05/2018 10:33 AM

## 2018-06-05 NOTE — Plan of Care (Signed)
Patient is extremely knowledgeable of his medication regimen. He is current taking  [place] and is well informed about the perimeters surrounding it's administration. Oncoming RN was made aware of patient's QTC measurement and that the medication was held by previous RN per MD order.

## 2018-06-06 ENCOUNTER — Other Ambulatory Visit: Payer: Self-pay

## 2018-06-06 DIAGNOSIS — I4819 Other persistent atrial fibrillation: Secondary | ICD-10-CM

## 2018-06-06 LAB — BASIC METABOLIC PANEL
Anion gap: 12 (ref 5–15)
BUN: 24 mg/dL — ABNORMAL HIGH (ref 8–23)
CHLORIDE: 95 mmol/L — AB (ref 98–111)
CO2: 27 mmol/L (ref 22–32)
CREATININE: 1.45 mg/dL — AB (ref 0.61–1.24)
Calcium: 9.7 mg/dL (ref 8.9–10.3)
GFR calc Af Amer: 53 mL/min — ABNORMAL LOW (ref >60)
GFR calc non Af Amer: 46 mL/min — ABNORMAL LOW (ref >60)
Glucose, Bld: 94 mg/dL (ref 70–99)
Potassium: 3.5 mmol/L (ref 3.5–5.1)
Sodium: 134 mmol/L — ABNORMAL LOW (ref 135–145)

## 2018-06-06 LAB — CBC
HCT: 46.9 % (ref 39.0–52.0)
Hemoglobin: 15.4 g/dL (ref 13.0–17.0)
MCH: 28.3 pg (ref 26.0–34.0)
MCHC: 32.8 g/dL (ref 30.0–36.0)
MCV: 86.1 fL (ref 80.0–100.0)
Platelets: 209 10*3/uL (ref 150–400)
RBC: 5.45 MIL/uL (ref 4.22–5.81)
RDW: 12.8 % (ref 11.5–15.5)
WBC: 11.3 10*3/uL — ABNORMAL HIGH (ref 4.0–10.5)
nRBC: 0 % (ref 0.0–0.2)

## 2018-06-06 LAB — MAGNESIUM: Magnesium: 1.9 mg/dL (ref 1.7–2.4)

## 2018-06-06 MED ORDER — DOFETILIDE 125 MCG PO CAPS
125.0000 ug | ORAL_CAPSULE | Freq: Two times a day (BID) | ORAL | Status: DC
  Administered 2018-06-06 – 2018-06-07 (×3): 125 ug via ORAL
  Filled 2018-06-06 (×3): qty 1

## 2018-06-06 MED ORDER — MAGNESIUM SULFATE IN D5W 1-5 GM/100ML-% IV SOLN
1.0000 g | Freq: Once | INTRAVENOUS | Status: AC
  Administered 2018-06-06: 1 g via INTRAVENOUS
  Filled 2018-06-06: qty 100

## 2018-06-06 MED ORDER — BISACODYL 5 MG PO TBEC: 5.0000 mg | DELAYED_RELEASE_TABLET | Freq: Every day | ORAL | Status: DC | PRN

## 2018-06-06 MED ORDER — METOLAZONE 5 MG PO TABS: 5.0000 mg | ORAL_TABLET | Freq: Once | ORAL | Status: DC

## 2018-06-06 MED ORDER — BISACODYL 5 MG PO TBEC
5.0000 mg | DELAYED_RELEASE_TABLET | Freq: Once | ORAL | Status: AC
  Administered 2018-06-06: 5 mg via ORAL
  Filled 2018-06-06: qty 1

## 2018-06-06 MED ORDER — POTASSIUM CHLORIDE CRYS ER 20 MEQ PO TBCR
60.0000 meq | EXTENDED_RELEASE_TABLET | ORAL | Status: AC
  Administered 2018-06-06 (×2): 60 meq via ORAL
  Filled 2018-06-06 (×2): qty 3

## 2018-06-06 MED ORDER — METOLAZONE 5 MG PO TABS
5.0000 mg | ORAL_TABLET | Freq: Once | ORAL | Status: AC
  Administered 2018-06-06: 5 mg via ORAL
  Filled 2018-06-06: qty 1

## 2018-06-06 NOTE — Discharge Summary (Signed)
Advanced Heart Failure Discharge Note  Discharge Summary   Patient ID: Christopher Benton MRN: [id number], DOB/AGE: 1940/08/31 78 y.o. Admit date: 05/31/2018 D/C date:    06/07/18    Primary Discharge Diagnoses:  1. 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  2. Pleural effusion:  3. Atrial fibrillation: Paroxysmal. 4. AKI 5. Prolonged QTc 6. Cardiac amyloidosis  Hospital Course:   TED GOODNER is a 78 y.o. male with history of PAF , TTR amyloidosis (tamfamidis), severe LVH, and chronic systolic heart failure. In August 2017 he had stress test at the [facility] that showed EF 41% and no ischemia.  Admitted with increased dyspnea and volume overload.Problem based hospital course as below.   1. 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 Echo (10/19) with severe LVH, EF 45-50%. 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 He is on tafamidis (x about 2 wks). He has had progressive dyspnea and volume overload, admitted from Dr. [name] office. Diuresed very well on IV lasix and metolazone with over 45 lbs off overall. - Transitioned to torsemide for home. - Being considered for Cardiomems as outpatient. 2. Pleural effusion with history of thoracentesis on right in 2017. - Pt underwent Thoracentesis 06/02/18 with 2 L off. Transudative from CHF 3. Atrial fibrillation: Paroxysmal - Remains in NSR. QTc 513 06/06/2018 after decreasing Tikosyn 06/04/17. Discussed with EP who agreed with decrease, and thought 250 would be appropriate dose for now. 4. 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 This may make volume difficult to manage chronically. 5. Prolonged QTc - Tikosyn cut back due to prolonged QTc. EP consulted with recommendations appreciated. On day of discharge, QTc calculated at 480 msec. Discharge plan for Tikoysn 125 mg BID.  6. Cardiac amyloidosis - On tafamidis as above.  He was examined pm of 06/07/2018 after  repeat EKG and determined stable for discharge home with very close follow up as below.   Discharge Weight Range: 215.5 lbs Discharge Vitals: Blood pressure 99/72, pulse 70, temperature 98.4 F (36.9 C), temperature source Oral, resp. rate 20, height 5\' 11"  (1.803 m), weight 97.8 kg, SpO2 93 %.  Labs: Lab Results  Component Value Date   WBC 10.3 06/07/2018   HGB 16.5 06/07/2018   HCT 49.3 06/07/2018   MCV 86.3 06/07/2018   PLT 232 06/07/2018    Recent Labs  Lab 06/02/18 1809  06/07/18 0444  NA  --    < > 136  K  --    < > 3.9  CL  --    < > 93*  CO2  --    < > 29  BUN  --    < > 29*  CREATININE  --    < > 1.78*  CALCIUM  --    < > 9.6  PROT 7.8  --   --   BILITOT 3.4*  --   --   ALKPHOS 72  --   --   ALT 14  --   --   AST 25  --   --   GLUCOSE  --    < > 92   < > = values in this interval not displayed.   Lab Results  Component Value Date   CHOL 88 (L) 12/25/2015   HDL 28 (L) 12/25/2015   LDLCALC 31 12/25/2015   TRIG 147 12/25/2015   BNP (last 3 results) Recent Labs    03/08/18 1454 05/31/18 1333  BNP 716.6* 502.8*  ProBNP (last 3 results) No results for input(s): PROBNP in the last 8760 hours.   Diagnostic Studies/Procedures   No results found.  Discharge Medications   Allergies as of 06/07/2018      Reactions   Pradaxa [dabigatran Etexilate Mesylate] Nausea Only      Medication List    STOP taking these medications   furosemide 40 MG tablet Commonly known as:  LASIX     TAKE these medications   apixaban 5 MG Tabs tablet Commonly known as:  ELIQUIS Take 1 tablet (5 mg total) by mouth 2 (two) times daily.   carvedilol 3.125 MG tablet Commonly known as:  COREG Take 1 tablet (3.125 mg total) by mouth 2 (two) times daily with a meal. What changed:    medication strength  how much to take  when to take this   dofetilide 125 MCG capsule Commonly known as:  TIKOSYN Take 1 capsule (125 mcg total) by mouth 2 (two) times daily. What  changed:    medication strength  how much to take   dorzolamide 2 % ophthalmic solution Commonly known as:  TRUSOPT Place 1 drop into both eyes 2 (two) times daily.   latanoprost 0.005 % ophthalmic solution Commonly known as:  XALATAN Place 1 drop into both eyes at bedtime.   multivitamin with minerals tablet Take 1 tablet by mouth daily.   potassium chloride 10 MEQ tablet Commonly known as:  K-DUR,KLOR-CON Take 2 tablets (20 mEq total) by mouth 2 (two) times daily. What changed:    how much to take  when to take this   spironolactone 25 MG tablet Commonly known as:  ALDACTONE Take 0.5 tablets (12.5 mg total) by mouth daily.   Tafamidis 61 MG Caps Take 61 mg by mouth daily.   torsemide 20 MG tablet Commonly known as:  DEMADEX Take 3 tablets (60 mg total) by mouth daily. Start taking on:  June 08, 2018      Disposition   The patient will be discharged in stable condition to home.  Discharge Instructions    Diet - low sodium heart healthy   Complete by:  As directed    Increase activity slowly   Complete by:  As directed      Follow-up Information    [place]. Go on 06/16/2018.   Specialty:  Cardiology Why:  at 2:30pm in the [facility].  Please bring all medicatiosn to appt.  Gate code is [phone] for January. Contact information: [address] [id number] [place] Follow up.   Specialty:  Cardiology Why:  06/28/2018 @ 10:00AM, follow up on Tikosyn adjustement/medicine Contact information: [address] [id number] [place]            Duration of Discharge Encounter: Greater than 35 minutes   Signed, Shirley Friar, PA-C 06/07/2018, 2:04 PM

## 2018-06-06 NOTE — Consult Note (Addendum)
Cardiology Consultation:   Patient ID: ABRAN Christopher Benton MRN: [id number]; DOB: [birth year]  Admit date: 05/31/2018 Date of Consult: 06/06/2018  Primary Care Provider: [name], MD Primary Cardiologist: Kirk Ruths, MD and [place] Primary Electrophysiologist:  Dr. Lovena Le (last in 2017)   Patient Profile:   Christopher Benton is a 78 y.o. male with a hx of NICM, TTR amyloidosis (tamfamidis), severe LVH, and chronic systolic heart failure, persistent AFib/flutter (atypical) who is being seen today for the evaluation of Tikosyn therapy at the request of Dr. Aundra Dubin.  AFib/flutter hx dates back to 2017 AAD Hx: Tikosyn, started 04/2016  History of Present Illness:   [name] was admitted from the office with acute on chronic combined CHF, AHF team is managing.  He is cumulatively fluid neg -10,474ml, though still felt to have excess volume.  He underwent R thoracentesis removing 2.1L of fluid, f/u CXR follow day though noted recurrance. During his stay he has been observed to have some QT prolongation prompting reduction of his dose from 543mcg to 245mcg that did improve his QT though this am again felt to have some prolongation and reduced further to 150mcg, EP is asked to provide guidance of his AAD.  LABS (most recent) K+ 3.5 Mag 1.9 BUN/Creat 24/1.45 WBC 11.3 H/H 15/46 Plts 209  He is feeling quite well this AM, feels ready to go home.  He has not had any CP or palpitations, came with swelling and increasing SOB, no reports of any dizzy spells, near syncope or syncope.  He does not report any symptoms of AF that he is aware of.    Past Medical History:  Diagnosis Date  . Arthritis    "left foot" (06/01/2018)  . Atrial fibrillation ([place])   . CAD (coronary artery disease)   . Cardiomyopathy ([place])   . Chronic systolic CHF (congestive heart failure) ([place]) 04/12/2016  . Dyspnea   . Dysrhythmia Atrial flutter  . 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 Hypertension     Past Surgical History:  Procedure Laterality Date  . CARDIOVERSION N/A 09/04/2015   Procedure: CARDIOVERSION;  Surgeon: Skeet Latch, MD;  Location: [place];  Service: Cardiovascular;  Laterality: N/A;  . IR THORACENTESIS ASP PLEURAL SPACE W/IMG GUIDE  06/02/2018  . TEE WITHOUT CARDIOVERSION N/A 09/04/2015   Procedure: TRANSESOPHAGEAL ECHOCARDIOGRAM (TEE);  Surgeon: Skeet Latch, MD;  Location: [facility];  Service: Cardiovascular;  Laterality: N/A;  . TEE WITHOUT CARDIOVERSION N/A 04/15/2016   Procedure: TRANSESOPHAGEAL ECHOCARDIOGRAM (TEE);  Surgeon: Jerline Pain, MD;  [facility] ENDOSCOPY;  Service: Cardiovascular;  Laterality: N/A;  . TOE AMPUTATION Right    4th digit     Home Medications:  Prior to Admission medications   Medication Sig Start Date End Date Taking? Authorizing Provider  apixaban (ELIQUIS) 5 MG TABS tablet Take 1 tablet (5 mg total) by mouth 2 (two) times daily. 08/28/15  Yes Lelon Perla, MD  carvedilol (COREG) 12.5 MG tablet Take 1 tablet (12.5 mg total) by mouth 2 (two) times daily. 03/10/18  Yes Bufford Lope, DO  dofetilide (TIKOSYN) 500 MCG capsule Take 1 capsule (500 mcg total) by mouth 2 (two) times daily. 04/28/16  Yes Sherran Needs, NP  dorzolamide (TRUSOPT) 2 % ophthalmic solution Place 1 drop into both eyes 2 (two) times daily.   Yes [provider]  furosemide (LASIX) 40 MG tablet Take 2 tablets (80 mg total) by mouth daily as  needed for fluid (for notice weight gain of >3 lbs in 24-48 hours and notify your doctor.). 03/10/18  Yes Orson Eva J, DO  latanoprost (XALATAN) 0.005 % ophthalmic solution Place 1 drop into both eyes at bedtime.   Yes [provider]  Multiple Vitamins-Minerals (MULTIVITAMIN WITH MINERALS) tablet Take 1 tablet by mouth daily.   Yes [provider]  potassium chloride (K-DUR,KLOR-CON) 10 MEQ tablet Take 1 tablet (10 mEq total) by mouth daily. 04/29/16   Yes Sherran Needs, NP  spironolactone (ALDACTONE) 25 MG tablet Take 0.5 tablets (12.5 mg total) by mouth daily. 03/11/18  Yes Orson Eva J, DO  Tafamidis 61 MG CAPS Take 61 mg by mouth daily.    Yes [provider]    Inpatient Medications: Scheduled Meds: . apixaban  5 mg Oral BID  . carvedilol  3.125 mg Oral BID WC  . dofetilide  125 mcg Oral BID  . dorzolamide  1 drop Both Eyes BID  . furosemide  80 mg Intravenous BID  . latanoprost  1 drop Both Eyes QHS  . polyethylene glycol  17 g Oral Daily  . potassium chloride  10 mEq Oral BID  . potassium chloride  60 mEq Oral Q4H  . sodium chloride flush  3 mL Intravenous Q12H  . spironolactone  12.5 mg Oral Daily  . Tafamidis  61 mg Oral Daily   Continuous Infusions: . sodium chloride    . magnesium sulfate 1 - 4 g bolus IVPB     PRN Meds: sodium chloride, acetaminophen, ALPRAZolam, benzonatate, docusate sodium, guaiFENesin-dextromethorphan, lidocaine, magnesium hydroxide, nitroGLYCERIN, ondansetron (ZOFRAN) IV, zolpidem  Allergies:    Allergies  Allergen Reactions  . Pradaxa [Dabigatran Etexilate Mesylate] Nausea Only    Social History:   Social History   Socioeconomic History  . Marital status: Married    Spouse name: Not on file  . Number of children: 2  . Years of education: Not on file  . Highest education level: Not on file  Occupational History  . Not on file  Social Needs  . Financial resource strain: Not on file  . Food insecurity:    Worry: Not on file    Inability: Not on file  . Transportation needs:    Medical: Not on file    Non-medical: Not on file  Tobacco Use  . Smoking status: Former Smoker    Packs/day: 0.12    Types: Cigarettes    Last attempt to quit: 1990    Years since quitting: 30.0  . Smokeless tobacco: Never Used  . Tobacco comment: 06/01/2018 "smoked off and on for 30 years; don't know how many years I actually smoked"  Substance and Sexual Activity  . Alcohol use: Not  Currently    Comment: 06/01/2018 "nothing in the last 10 years or more"  . Drug use: Never  . Sexual activity: Not on file  Lifestyle  . Physical activity:    Days per week: Not on file    Minutes per session: Not on file  . Stress: Not on file  Relationships  . Social connections:    Talks on phone: Not on file    Gets together: Not on file    Attends religious service: Not on file    Active member of club or organization: Not on file    Attends meetings of clubs or organizations: Not on file    Relationship status: Not on file  . Intimate partner violence:    Fear of  current or ex partner: Not on file    Emotionally abused: Not on file    Physically abused: Not on file    Forced sexual activity: Not on file  Other Topics Concern  . Not on file  Social History Narrative  . Not on file    Family History:   Family History  Problem Relation Age of Onset  . Heart disease Other        No family history     ROS:  Please see the history of present illness.  All other ROS reviewed and negative.     Physical Exam/Data:   Vitals:   06/05/18 2110 06/06/18 0613 06/06/18 0622 06/06/18 0829  BP: 102/65 102/76  102/77  Pulse: 66 63  69  Resp: 18 18    Temp: 98.6 F (37 C) 98.4 F (36.9 C)    TempSrc: Oral Oral    SpO2: 95% 94%    Weight:   99.4 kg   Height:        Intake/Output Summary (Last 24 hours) at 06/06/2018 1001 Last data filed at 06/06/2018 0723 Gross per 24 hour  Intake 240 ml  Output 3150 ml  Net -2910 ml   Filed Weights   06/04/18 0527 06/05/18 0601 06/06/18 0622  Weight: 104.4 kg 101 kg 99.4 kg   Body mass index is 30.57 kg/m.  General:  Well nourished, well developed, in no acute distress HEENT: normal Lymph: no adenopathy Neck: + JVD Endocrine:  No thryomegaly Vascular: No carotid bruits Cardiac:  RRR; no murmurs, gallops or rubs Lungs:  CTA b/l, no wheezing, rhonchi or rales  Abd: soft, nontender Ext: no edema Musculoskeletal:  No deformities,  age appropriate atrophy Skin: warm and dry  Neuro:  No gross focal abnormalities noted Psych:  Normal affect   EKG:  The EKG was personally reviewed and demonstrates:   05/31/18 SR 64bpm, measured QT 410ms, QTc 496 (on 554mcg BID) 1/2: SR 66bpm, measured QT 480-500, QTc  1/3: SR 62, measured QT 424ms, QTc 473ms 1/4: SB 56, measured QT 52ms, QTc 561ms  (PM dose tikosyn held) 1/5: SR 61bpm, measured QT 476ms, dose down-titrated to 227mcg 1/6: measured QT 480-567ms, QTc 488-565ms (machine read 534ms/QTc 548ms >> dose adjusted down for today 1108mcg BID)  Telemetry:  Telemetry was personally reviewed and demonstrates:   SR, 60's generally No arrhythmias, ectopy  Relevant CV Studies:  PYP 03/11/2018  Visual and quantitative assessment (grade 3, H/CL equal 3.4) are strongly suggestive of transthyretin amyloidosis.  03/10/18 TTE Study Conclusions - Left ventricle: The cavity size was normal. There was severe   concentric hypertrophy. 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   Consider PYP scan. Systolic function was mildly reduced. The   estimated ejection fraction was in the range of 45% to 50%. There   is hypokinesis of the basalinferoseptal myocardium. Features are   consistent with a pseudonormal left ventricular filling pattern,   with concomitant abnormal relaxation and increased filling   pressure (grade 2 diastolic dysfunction). - Aortic valve: Trileaflet; moderately thickened, moderately   calcified leaflets. There was trivial regurgitation. - Aorta: Ascending aortic diameter: 38 mm (S). - Ascending aorta: The ascending aorta was mildly dilated. - Left atrium: The atrium was moderately to severely dilated. - Right atrium: The atrium was severely dilated. - Tricuspid valve: There was mild regurgitation. - Pulmonary arteries: PA peak pressure: 40 mm Hg (S). Impressions: - Unable to view prior images to compare.  [facility] 41% and  no ischemia.   Laboratory Data:  Chemistry Recent Labs  Lab 06/04/18 0450 06/05/18 0424 06/06/18 0537  NA 135 133* 134*  K 3.6 3.6 3.5  CL 97* 93* 95*  CO2 28 27 27   GLUCOSE 100* 95 94  BUN 25* 27* 24*  CREATININE 1.48* 1.59* 1.45*  CALCIUM 9.4 9.6 9.7  GFRNONAA 45* 41* 46*  GFRAA 52* 48* 53*  ANIONGAP 10 13 12     Recent Labs  Lab 05/31/18 1333 06/02/18 1809  PROT 8.1 7.8  ALBUMIN 4.7 4.5  AST 27 25  ALT 15 14  ALKPHOS 72 72  BILITOT 3.9* 3.4*   Hematology Recent Labs  Lab 06/04/18 0450 06/05/18 0424 06/06/18 0537  WBC 9.2 10.2 11.3*  RBC 5.03 5.54 5.45  HGB 14.3 15.3 15.4  HCT 43.9 47.6 46.9  MCV 87.3 85.9 86.1  MCH 28.4 27.6 28.3  MCHC 32.6 32.1 32.8  RDW 13.1 12.9 12.8  PLT 178 192 209   Cardiac Enzymes Recent Labs  Lab 05/31/18 1333  TROPONINI 0.09*   No results for input(s): TROPIPOC in the last 168 hours.  BNP Recent Labs  Lab 05/31/18 1333  BNP 502.8*    DDimer No results for input(s): DDIMER in the last 168 hours.  Radiology/Studies:   Dg Chest 2 View Result Date: 06/03/2018 CLINICAL DATA:  Prior thoracentesis.  Shortness of breath. EXAM: CHEST - 2 VIEW COMPARISON:  Prior exams of 06/02/2018 FINDINGS: Mediastinum is stable. Stable cardiomegaly. Recurrence of right-sided pleural effusion noted. Pleural effusion appears prominent. Small left pleural effusion. No pneumothorax. Degenerative changes thoracic spine. Undigested pill fragment noted over the stomach. IMPRESSION: 1. Recurrence of right-sided pleural effusion. Pleural effusion appears prominent. 2.  Small left pleural effusion. Electronically Signed   By: Marcello Moores  Register   On: 06/03/2018 09:42    Ir Thoracentesis Asp Pleural Space W/img Guide Result Date: 06/02/2018 Lynnea Ferrier     06/02/2018  4:21 PM PROCEDURE SUMMARY: Successful image-guided right thoracentesis. Yielded 2.1 liters of clear yellow fluid. Procedure was stopped due to patient complaints of chest pain and  coughing - O2 noted to be 88%, placed on 4L O2 initially via [place] without improvement, increased to 6 L O2 and simple mask applied which improved O2 to 95%. Patient transported back to floor in stable condition and resolution of earlier complaints. EBL: Zero No immediate complications. Specimen was sent for labs. Post procedure CXR shows no pneumothorax. Please see imaging section of Epic for full dictation. Joaquim Nam PA-C 06/02/2018 4:19 PM    Assessment and Plan:   1. Persistent AFib/atypical AFlutter     CHA2DS2Vasc is 3, on Eliquis, appropriately dosed  AAD Tikosyn, has done well for the last couple years His QT has lengthened slightly this hospital stay, his creat also higher then his baseline TODAY's clearance is still 60 using today's recorded weight, yesterday  Yesterday's at his highest Creat clearance was 56  I think his QT is borderline, would repeat a 3 hour EKG after the 113mcg dose, this dose though may not hold SR, his next/last AAD option would be amiodarone if needed LA by echo in Oct measured 85mm, described as mod-severely dilated Keep K+ better then 4.0 (being replaced), and mag >2.0 Med list reviewed, no other QT prolonging or any contraindicated meds noted (I have discontinued PRN Zofran, he has not gotten any) Dr. Lovena Le will see       For questions or  updates, please contact [place] Please consult [email or web address] for contact info under     Signed, Baldwin [place], PA-C  06/06/2018 10:01 AM  EP attending  Patient seen and examined.  Agree with the findings as noted above.  The patient has developed prolongation of the QT interval on his chronic dose of dofetilide.  He has some renal insufficiency.  His dose of dofetilide has been reduced from 500 mcg twice daily to 250 mcg twice daily and then down to 125 mcg twice daily.  His QT interval improved minimally with the initial reduction but has improved nicely at the lower dose. He has not yet reverted  back to atrial fib and hopefully he will maintain NSR. I would recommend 2 more doses of dofetilide at the lowest 125 mcg twice daily dose. Ok to DC home tomorrow afternoon if his QT interval is stable. He will need cardiology followup.  Mikle Bosworth.D.

## 2018-06-06 NOTE — Progress Notes (Addendum)
Patient ID: Christopher Benton, male   DOB: 29-Mar-1941, 78 y.o.   MRN: [id number]     Advanced Heart Failure Rounding Note  PCP-Cardiologist: Kirk Ruths, MD   Subjective:    Had IR guided thoracentesis 06/02/18 with 2 L clear yellow fluid off, transudative.   Negative 2.7 L with metolazone.  Creatinine 1.33 => 1.48 => 1.59. Pending today.   Feeling OK today. Denies SOB. Ankles no longer swollen. Denies dizziness or lightheadedness.   QTc 481 -> 513 msec today on decreased Tikosyn.   Objective:   Weight Range: 99.4 kg Body mass index is 30.57 kg/m.   Vital Signs:   Temp:  [98.2 F (36.8 C)-98.6 F (37 C)] 98.4 F (36.9 C) (01/06 0613) Pulse Rate:  [54-70] 63 (01/06 0613) Resp:  [18] 18 (01/06 0613) BP: (102-112)/(65-82) 102/76 (01/06 0613) SpO2:  [94 %-95 %] 94 % (01/06 0613) Weight:  [99.4 kg] 99.4 kg (01/06 0622) Last BM Date: 06/05/18  Weight change: Filed Weights   06/04/18 0527 06/05/18 0601 06/06/18 0622  Weight: 104.4 kg 101 kg 99.4 kg    Intake/Output:   Intake/Output Summary (Last 24 hours) at 06/06/2018 0801 Last data filed at 06/06/2018 0622 Gross per 24 hour  Intake 440 ml  Output 3150 ml  Net -2710 ml    Physical Exam    General: NAD HEENT: Normal Neck: Supple. JVP at least 10-11 cm. Carotids 2+ bilat; no bruits. No thyromegaly or nodule noted. Cor: PMI nondisplaced. RRR, No M/G/R noted Lungs: CTAB, normal effort. Abdomen: Soft, non-tender, non-distended, no HSM. No bruits or masses. +BS  Extremities: No cyanosis, clubbing, or rash. R and LLE no edema.  Neuro: Alert & orientedx3, cranial nerves grossly intact. moves all 4 extremities w/o difficulty. Affect pleasant   Telemetry   NSR 60-70s, personally reviewed.   EKG    NSR 62 bpm with 1st degree AV block and prolonged QTc 513 msec. Personally reviewed.   Labs    CBC Recent Labs    06/05/18 0424 06/06/18 0537  WBC 10.2 11.3*  HGB 15.3 15.4  HCT 47.6 46.9  MCV 85.9 86.1  PLT 192  073   Basic Metabolic Panel Recent Labs    06/04/18 0450 06/05/18 0424  NA 135 133*  K 3.6 3.6  CL 97* 93*  CO2 28 27  GLUCOSE 100* 95  BUN 25* 27*  CREATININE 1.48* 1.59*  CALCIUM 9.4 9.6  MG 2.1 2.1   Liver Function Tests No results for input(s): AST, ALT, ALKPHOS, BILITOT, PROT, ALBUMIN in the last 72 hours. No results for input(s): LIPASE, AMYLASE in the last 72 hours. Cardiac Enzymes No results for input(s): CKTOTAL, CKMB, CKMBINDEX, TROPONINI in the last 72 hours.  BNP: BNP (last 3 results) Recent Labs    03/08/18 1454 05/31/18 1333  BNP 716.6* 502.8*    ProBNP (last 3 results) No results for input(s): PROBNP in the last 8760 hours.   D-Dimer No results for input(s): DDIMER in the last 72 hours. Hemoglobin A1C No results for input(s): HGBA1C in the last 72 hours. Fasting Lipid Panel No results for input(s): CHOL, HDL, LDLCALC, TRIG, CHOLHDL, LDLDIRECT in the last 72 hours. Thyroid Function Tests No results for input(s): TSH, T4TOTAL, T3FREE, THYROIDAB in the last 72 hours.  Invalid input(s): FREET3  Other results:   Imaging    No results found.   Medications:     Scheduled Medications: . apixaban  5 mg Oral BID  . carvedilol  3.125 mg Oral  BID WC  . dofetilide  250 mcg Oral BID  . dorzolamide  1 drop Both Eyes BID  . furosemide  80 mg Intravenous BID  . latanoprost  1 drop Both Eyes QHS  . polyethylene glycol  17 g Oral Daily  . potassium chloride  10 mEq Oral BID  . sodium chloride flush  3 mL Intravenous Q12H  . spironolactone  12.5 mg Oral Daily  . Tafamidis  61 mg Oral Daily    Infusions: . sodium chloride      PRN Medications: sodium chloride, acetaminophen, ALPRAZolam, benzonatate, docusate sodium, guaiFENesin-dextromethorphan, lidocaine, magnesium hydroxide, nitroGLYCERIN, ondansetron (ZOFRAN) IV, zolpidem    Patient Profile   Christopher Benton is a 78 year old with history of PAF , TTR amyloidosis (tamfamidis), severe LVH,  and chronic systolic heart failure. In August 2017 he had stress test at the [facility] that showed EF 41% and no ischemia.   Admitted with increased dyspnea and volume overload.   Assessment/Plan   1. 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  Echo (10/19) with severe LVH, EF 45-50%.  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  He is on tafamidis (x about 2 wks).  He has had progressive dyspnea and volume overload, admitted from Dr. [name] office.  Diuresed very well on IV lasix and metolazone. Creatinine pending today.  - Continue Lasix 80 mg IV BID. Will discuss metolazone with MD (waiting on Cr).  - If renal function worsens, he may need RHC. Discussed today.  - Continue tafamidis.  - Continue current Coreg and spironolactone.  - No ACEI/ARB/ARNI with AKI.  - Being considered for Cardiomems as outpatient.  2. Pleural effusion:  - Had thoracentesis on right in 2017.  Thoracentesis 06/02/18 with 2 L off.  Transudative from CHF.  3. Atrial fibrillation: Paroxysmal. - Remains in NSR. QTc 513 today after decreasing Tikosyn 06/04/17. Discussed with EP who agree this may be too long. They will consult today and discuss further.  - Continue Tikosyn at lower dose 250 mg bid. Replace K and Mg.  - Continue Eliquis 5 mg bid.  4. 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  This may make volume difficult to manage.  Will continue diuretics for now.  - Creatinine pending today.   Christopher Friar, Christopher Benton  06/06/2018 8:01 AM  Advanced Heart Failure Team Pager [phone] (M-F; 7a - 4p)  Please contact [facility] for night-coverage after hours (4p -7a ) and weekends on [email or web address]  Patient seen with PA, agree with the above note.  Creatinine down a little to 1.45.  He diuresed well again yesterday.  On exam, still with volume overload but improving.   Will give dose of metolazone 5 mg po x 1 again today with IV Lasix.  Hopefully to po tomorrow.    Borderline QTc on Tikosyn 250 mcg bid, will ask EP to see and comment on whether or not we should decrease the dose again.   Replace K and Mg.    Christopher Benton 06/06/2018 11:27 AM

## 2018-06-07 LAB — CBC
HCT: 49.3 % (ref 39.0–52.0)
HEMOGLOBIN: 16.5 g/dL (ref 13.0–17.0)
MCH: 28.9 pg (ref 26.0–34.0)
MCHC: 33.5 g/dL (ref 30.0–36.0)
MCV: 86.3 fL (ref 80.0–100.0)
Platelets: 232 10*3/uL (ref 150–400)
RBC: 5.71 MIL/uL (ref 4.22–5.81)
RDW: 12.5 % (ref 11.5–15.5)
WBC: 10.3 10*3/uL (ref 4.0–10.5)
nRBC: 0 % (ref 0.0–0.2)

## 2018-06-07 LAB — BASIC METABOLIC PANEL
Anion gap: 14 (ref 5–15)
BUN: 29 mg/dL — ABNORMAL HIGH (ref 8–23)
CHLORIDE: 93 mmol/L — AB (ref 98–111)
CO2: 29 mmol/L (ref 22–32)
Calcium: 9.6 mg/dL (ref 8.9–10.3)
Creatinine, Ser: 1.78 mg/dL — ABNORMAL HIGH (ref 0.61–1.24)
GFR calc Af Amer: 42 mL/min — ABNORMAL LOW (ref >60)
GFR calc non Af Amer: 36 mL/min — ABNORMAL LOW (ref >60)
Glucose, Bld: 92 mg/dL (ref 70–99)
Potassium: 3.9 mmol/L (ref 3.5–5.1)
Sodium: 136 mmol/L (ref 135–145)

## 2018-06-07 LAB — CULTURE, BODY FLUID W GRAM STAIN -BOTTLE: Culture: NO GROWTH

## 2018-06-07 LAB — MAGNESIUM: Magnesium: 2.2 mg/dL (ref 1.7–2.4)

## 2018-06-07 MED ORDER — TORSEMIDE 20 MG PO TABS: 60.0000 mg | ORAL_TABLET | Freq: Every day | ORAL | Status: DC

## 2018-06-07 MED ORDER — DOFETILIDE 125 MCG PO CAPS: 125.0000 ug | ORAL_CAPSULE | Freq: Two times a day (BID) | ORAL | 6 refills | Status: DC

## 2018-06-07 MED ORDER — CARVEDILOL 3.125 MG PO TABS: 3.1250 mg | ORAL_TABLET | Freq: Two times a day (BID) | ORAL | 6 refills | Status: DC

## 2018-06-07 MED ORDER — POTASSIUM CHLORIDE CRYS ER 10 MEQ PO TBCR: 20.0000 meq | EXTENDED_RELEASE_TABLET | Freq: Two times a day (BID) | ORAL | 6 refills | Status: DC

## 2018-06-07 MED ORDER — TORSEMIDE 20 MG PO TABS: 60.0000 mg | ORAL_TABLET | Freq: Every day | ORAL | 6 refills | Status: DC

## 2018-06-07 MED ORDER — FUROSEMIDE 40 MG PO TABS: 40.0000 mg | ORAL_TABLET | Freq: Two times a day (BID) | ORAL | Status: DC

## 2018-06-07 NOTE — Progress Notes (Signed)
I completed a follow up visit with the patient and his wife. I provide spiritual support through pastoral presence, words of encouragement, and by leading in prayer.    06/07/18 1100  Clinical Encounter Type  Visited With Patient and family together  Visit Type Follow-up;Spiritual support  Stress Factors  Patient Stress Factors None identified  Family Stress Factors None identified    Chaplain Dr Redgie Grayer

## 2018-06-07 NOTE — Care Management (Signed)
1434 06-07-18 Patient has printed Rx's to take to a local pharmacy to get medications filled. Unit Secretary faxed d/c summary and medications to the [facility]. No further needs from CM at this time. Bethena Roys, RN,BSN Case Manager [phone]

## 2018-06-07 NOTE — Progress Notes (Addendum)
Patient ID: Christopher Benton, male   DOB: January 31, 1941, 78 y.o.   MRN: [id number]     Advanced Heart Failure Rounding Note  PCP-Cardiologist: Kirk Ruths, MD   Subjective:    Had IR guided thoracentesis 06/02/18 with 2 L clear yellow fluid off, transudative.   Cr 1.45 -> 1.78. Negative 2.5 L and down 38 lbs total (Was 253 at [facility] 12/31).   Feeling good today. Wants to go home. Denies SOB, lightheadedness or dizziness.    QTc 488 today on Tikosyn 125 mg BID. (Received dose of 125 am of 06/06/18, EP evaluated)  Objective:   Weight Range: 97.8 kg Body mass index is 30.06 kg/m.   Vital Signs:   Temp:  [98.1 F (36.7 C)-98.4 F (36.9 C)] 98.4 F (36.9 C) (01/07 0457) Pulse Rate:  [52-70] 69 (01/07 0457) Resp:  [18-20] 20 (01/06 2111) BP: (92-131)/(75-91) 112/91 (01/07 0457) SpO2:  [94 %-97 %] 94 % (01/07 0457) Weight:  [97.8 kg] 97.8 kg (01/07 0457) Last BM Date: 06/06/18  Weight change: Filed Weights   06/05/18 0601 06/06/18 0622 06/07/18 0457  Weight: 101 kg 99.4 kg 97.8 kg    Intake/Output:   Intake/Output Summary (Last 24 hours) at 06/07/2018 0759 Last data filed at 06/07/2018 0530 Gross per 24 hour  Intake 936.41 ml  Output 3200 ml  Net -2263.59 ml    Physical Exam    General: NAD. No resp difficulty. HEENT: Normal Neck: Supple. JVP 6-7 cm. Carotids 2+ bilat; no bruits. No thyromegaly or nodule noted. Cor: PMI nondisplaced. RRR, No M/G/R noted Lungs: CTAB, normal effort. Abdomen: Soft, non-tender, non-distended, no HSM. No bruits or masses. +BS  Extremities: No cyanosis, clubbing, or rash. R and LLE no edema.  Neuro: Alert & orientedx3, cranial nerves grossly intact. moves all 4 extremities w/o difficulty. Affect pleasant   Telemetry   NSR 60-70s, personally reviewed.   EKG    NSR 67 bpm with 1st degree AV block, Qtc 488 msec, personally reviewed.   Labs    CBC Recent Labs    06/06/18 0537 06/07/18 0444  WBC 11.3* 10.3  HGB 15.4 16.5  HCT 46.9  49.3  MCV 86.1 86.3  PLT 209 330   Basic Metabolic Panel Recent Labs    06/06/18 0537 06/07/18 0444  NA 134* 136  K 3.5 3.9  CL 95* 93*  CO2 27 29  GLUCOSE 94 92  BUN 24* 29*  CREATININE 1.45* 1.78*  CALCIUM 9.7 9.6  MG 1.9 2.2   Liver Function Tests No results for input(s): AST, ALT, ALKPHOS, BILITOT, PROT, ALBUMIN in the last 72 hours. No results for input(s): LIPASE, AMYLASE in the last 72 hours. Cardiac Enzymes No results for input(s): CKTOTAL, CKMB, CKMBINDEX, TROPONINI in the last 72 hours.  BNP: BNP (last 3 results) Recent Labs    03/08/18 1454 05/31/18 1333  BNP 716.6* 502.8*    ProBNP (last 3 results) No results for input(s): PROBNP in the last 8760 hours.   D-Dimer No results for input(s): DDIMER in the last 72 hours. Hemoglobin A1C No results for input(s): HGBA1C in the last 72 hours. Fasting Lipid Panel No results for input(s): CHOL, HDL, LDLCALC, TRIG, CHOLHDL, LDLDIRECT in the last 72 hours. Thyroid Function Tests No results for input(s): TSH, T4TOTAL, T3FREE, THYROIDAB in the last 72 hours.  Invalid input(s): FREET3  Other results:   Imaging    No results found.   Medications:     Scheduled Medications: . apixaban  5 mg  Oral BID  . carvedilol  3.125 mg Oral BID WC  . dofetilide  125 mcg Oral BID  . dorzolamide  1 drop Both Eyes BID  . furosemide  80 mg Intravenous BID  . latanoprost  1 drop Both Eyes QHS  . polyethylene glycol  17 g Oral Daily  . potassium chloride  10 mEq Oral BID  . sodium chloride flush  3 mL Intravenous Q12H  . spironolactone  12.5 mg Oral Daily  . Tafamidis  61 mg Oral Daily    Infusions: . sodium chloride      PRN Medications: sodium chloride, acetaminophen, ALPRAZolam, benzonatate, bisacodyl, docusate sodium, guaiFENesin-dextromethorphan, lidocaine, magnesium hydroxide, nitroGLYCERIN, zolpidem    Patient Profile   Christopher Benton is a 78 year old with history of PAF , TTR amyloidosis  (tamfamidis), severe LVH, and chronic systolic heart failure. In August 2017 he had stress test at the [facility] that showed EF 41% and no ischemia.   Admitted with increased dyspnea and volume overload.   Assessment/Plan   1. 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  Echo (10/19) with severe LVH, EF 45-50%.  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  He is on tafamidis (x about 2 wks).  He has had progressive dyspnea and volume overload, admitted from Dr. [name] office.  Diuresed very well on IV lasix and metolazone. Creatinine up to 1.7 with continued diuresis - Stop IV lasix. He was taking 40 mg lasix daily, with an additional as needed prior to admission.  - Will transition to torsemide 60 mg daily starting tomorrow.  - Continue tafamidis.  - Continue current Coreg and spironolactone.  - No ACEI/ARB/ARNI with AKI.  - Being considered for Cardiomems as outpatient.  2. Pleural effusion:  - Had thoracentesis on right in 2017.  Thoracentesis 06/02/18 with 2 L off.  Transudative from CHF. No change.  3. Atrial fibrillation: Paroxysmal. - Remains in NSR. QTc 488 msec.  - EP evaluated and agreed with decreased Tikosyn dose of 125 mg BID. Will repeat ECG 3 hours post Tikosyn. Appreciate input.  - Continue Eliquis 5 mg bid.  4. 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  This may make volume difficult to manage.  Will continue diuretics for now.  - Creatinine up to 1.7. to po diuretics as above.    Shirley Friar, PA-C  06/07/2018 7:59 AM  Advanced Heart Failure Team Pager [phone] (M-F; 7a - 4p)  Please contact [facility] for night-coverage after hours (4p -7a ) and weekends on [email or web address]  Patient seen with PA, agree with the above note.   Weight down again, creatinine up to 1.7.  Will stop IV Lasix today.  Volume status much improved.   QTc borderline today, most recently 504 msec.  Tikosyn dose decreased to 125 mcg bid.   Will check to make sure EP is ok with continuing this dose.   Meds for home: torsemide 60 daily, KCl 20 bid, tafamidis, Coreg 3.125 mg bid, spironolactone 12.5 daily, apixaban 5 bid, dofetilide 125 mcg bid.  Followup in CHF clinic, will consider Cardiomems use.   Loralie Champagne 06/07/2018 1:34 PM

## 2018-06-07 NOTE — Care Management Important Message (Signed)
Important Message  Patient Details  Name: Christopher Benton MRN: [id number] Date of Birth: May 12, 1941   Medicare Important Message Given:  Yes    [name] P [place] 06/07/2018, 2:20 PM

## 2018-06-07 NOTE — Progress Notes (Signed)
Patient was discharge home with wife at bedside. IV peripheral removed clean dry and intact, pressure and dressing applied.  Living better with HF packet  given and medication information with teach back. Patients questions and concerns were addressed and answered. Patient and wife denies any further questions. Prescriptions given per MD.  Patient belongings with patient: glasses,phone, recorder and wallet.  Nurse tech transported patient in wheelchair. CCMD called.

## 2018-06-07 NOTE — Progress Notes (Addendum)
Progress Note  Patient Name: Christopher Benton Date of Encounter: 06/07/2018  Primary Cardiologist: Kirk Ruths, MD   Subjective   Feels well, wants to go home  Inpatient Medications    Scheduled Meds: . apixaban  5 mg Oral BID  . carvedilol  3.125 mg Oral BID WC  . dofetilide  125 mcg Oral BID  . dorzolamide  1 drop Both Eyes BID  . furosemide  40 mg Oral BID  . latanoprost  1 drop Both Eyes QHS  . polyethylene glycol  17 g Oral Daily  . potassium chloride  10 mEq Oral BID  . sodium chloride flush  3 mL Intravenous Q12H  . spironolactone  12.5 mg Oral Daily  . Tafamidis  61 mg Oral Daily   Continuous Infusions: . sodium chloride     PRN Meds: sodium chloride, acetaminophen, ALPRAZolam, benzonatate, bisacodyl, docusate sodium, guaiFENesin-dextromethorphan, lidocaine, magnesium hydroxide, nitroGLYCERIN, zolpidem   Vital Signs    Vitals:   06/06/18 1622 06/06/18 2111 06/07/18 0457 06/07/18 0800  BP: 104/75 131/84 (!) 112/91 99/72  Pulse: (!) 52 70 69 70  Resp:  20    Temp:  98.4 F (36.9 C) 98.4 F (36.9 C)   TempSrc:  Oral Oral   SpO2:  96% 94% 93%  Weight:   97.8 kg   Height:        Intake/Output Summary (Last 24 hours) at 06/07/2018 0826 Last data filed at 06/07/2018 0530 Gross per 24 hour  Intake 936.41 ml  Output 3200 ml  Net -2263.59 ml   Filed Weights   06/05/18 0601 06/06/18 0622 06/07/18 0457  Weight: 101 kg 99.4 kg 97.8 kg    Telemetry    SR - Personally Reviewed  ECG    SR 67bpm, measured QT 434ms, QTc 410ms - Personally Reviewed  Physical Exam   GEN: No acute distress.   Neck: No JVD Cardiac: RRR, no murmurs, rubs, or gallops.  Respiratory: CTA b/l GI: Soft, nontender, non-distended  MS: No edema; No deformity. Neuro:  Nonfocal  Psych: Normal affect   Labs    Chemistry Recent Labs  Lab 05/31/18 1333  06/02/18 1809  06/05/18 0424 06/06/18 0537 06/07/18 0444  NA 135   < >  --    < > 133* 134* 136  K 4.5   < >  --    <  > 3.6 3.5 3.9  CL 100   < >  --    < > 93* 95* 93*  CO2 24   < >  --    < > 27 27 29   GLUCOSE 113*   < >  --    < > 95 94 92  BUN 15   < >  --    < > 27* 24* 29*  CREATININE 1.13   < >  --    < > 1.59* 1.45* 1.78*  CALCIUM 9.6   < >  --    < > 9.6 9.7 9.6  PROT 8.1  --  7.8  --   --   --   --   ALBUMIN 4.7  --  4.5  --   --   --   --   AST 27  --  25  --   --   --   --   ALT 15  --  14  --   --   --   --   ALKPHOS 72  --  72  --   --   --   --  BILITOT 3.9*  --  3.4*  --   --   --   --   GFRNONAA >60   < >  --    < > 41* 46* 36*  GFRAA >60   < >  --    < > 48* 53* 42*  ANIONGAP 11   < >  --    < > 13 12 14    < > = values in this interval not displayed.     Hematology Recent Labs  Lab 06/05/18 0424 06/06/18 0537 06/07/18 0444  WBC 10.2 11.3* 10.3  RBC 5.54 5.45 5.71  HGB 15.3 15.4 16.5  HCT 47.6 46.9 49.3  MCV 85.9 86.1 86.3  MCH 27.6 28.3 28.9  MCHC 32.1 32.8 33.5  RDW 12.9 12.8 12.5  PLT 192 209 232    Cardiac Enzymes Recent Labs  Lab 05/31/18 1333  TROPONINI 0.09*   No results for input(s): TROPIPOC in the last 168 hours.   BNP Recent Labs  Lab 05/31/18 1333  BNP 502.8*     DDimer No results for input(s): DDIMER in the last 168 hours.   Radiology    No results found.  Cardiac Studies   No new studies  Patient Profile     78 y.o. male with a hx of NICM, TTR amyloidosis (tamfamidis), severe LVH, and chronic systolic heart failure, persistent AFib/flutter (atypical), admitted with acute/chronic combined CHF.  EP asked to help with AAD management/Tikosyn with lengthening of his QT noted  Assessment & Plan    1. Persistent AFib/atypical AFlutter     CHA2DS2Vasc is 3, on Eliquis, appropriately dosed  AAD Tikosyn, has done well for the last couple years His QT has lengthened slightly this hospital stay, requiring down-titration of his Tikosyn, his creat also higher then his baseline, though remained adequate for dosing he has gotten here  He is  now on Tikosyn 158mcg dosing, his QTc stable this AM after 2 doses of 173mcg dose.  Dr. Lovena Le has seen and examined the patient discussed with the patient and wife at bedside plans to get 3rd 168mcg dose and if QT remained stable would be OK from our perspective to go home this afternoon.  If 180mcg dose does not hold rhythm for him, his next and only option for AAD would be amiodarone  Lytes are better K+ 3.9, mag 2.2 Telemetry with SR only  I discussed with AHF PA, to have initial fill via [place] to allow time for his out patient pharmacy to fill the new dose. [place] reported the patient's wife made him aware that she has already made the [place] aware and te 184mcg dose is available for the patient and declined [facility] pharmacy fill. The wife tells me the same, she had 139mcg dose available to them and instructed not to take the 564mcg dose any longer.  They are aware.  I will have EP folluw up in place if needed. He will see AHF in 1 week.     For questions or updates, please contact [place] Please consult [email or web address] for contact info under        Signed, Baldwin [place], PA-C  06/07/2018, 8:26 AM    EP Attending  Patient seen and examined. Agree with above. The patient is doing well today. His QTC is around 470-480 on Dofetilide 125 q12. He may continue this dose of medication. He will need an ECG and labs in the next week. Followup with me in 4-6 weeks. He has  not seen an EP MD.  Mikle Bosworth.D.

## 2018-06-08 ENCOUNTER — Encounter (HOSPITAL_COMMUNITY): Payer: Self-pay

## 2018-06-08 NOTE — Progress Notes (Signed)
HPI: FUatrial fibrillation/atrial flutter.Also with previous CT showing coronary calcification. Had stress test 8/17 at [facility] that showed EF 41 and no ischemia. Pt admitted 11/17 with dyspnea and right pleural effusion. Had thoracentesis and cytology negative; diuresed. TEE 11/17 showed EF 35-40, biatrial enlargement and moderate TR. Echocardiogram October 2019 showed ejection fraction 45 to 50%; severe left ventricular hypertrophy and amyloid should be considered; moderate diastolic dysfunction; mildly dilated ascending aorta, biatrial enlargement and mild tricuspid regurgitation.  CTA October 2019 showed large right pleural effusion. 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 Seen at the [facility] and initiated on tafamidis. Admitted with CHF 12/19; had R thoracentesis. Diuresed with improvement. Tikosyn dose reduced due to worsening renal insuff.  Since last seen,patient has some weakness but denies syncope, chest pain, dyspnea or pedal edema.  Current Outpatient Medications  Medication Sig Dispense Refill  . apixaban (ELIQUIS) 5 MG TABS tablet Take 1 tablet (5 mg total) by mouth 2 (two) times daily. 60 tablet 6  . carvedilol (COREG) 3.125 MG tablet Take 1 tablet (3.125 mg total) by mouth 2 (two) times daily with a meal. 60 tablet 6  . dofetilide (TIKOSYN) 125 MCG capsule Take 1 capsule (125 mcg total) by mouth 2 (two) times daily. 60 capsule 6  . dorzolamide (TRUSOPT) 2 % ophthalmic solution Place 1 drop into both eyes 2 (two) times daily.    Marland Kitchen latanoprost (XALATAN) 0.005 % ophthalmic solution Place 1 drop into both eyes at bedtime.    . Multiple Vitamins-Minerals (MULTIVITAMIN WITH MINERALS) tablet Take 1 tablet by mouth daily.    . potassium chloride (K-DUR,KLOR-CON) 10 MEQ tablet Take 2 tablets (20 mEq total) by mouth 2 (two) times daily. 120 tablet 6  . spironolactone (ALDACTONE) 25 MG tablet Take 0.5 tablets (12.5 mg total) by mouth daily. 30 tablet 0  . Tafamidis  61 MG CAPS Take 61 mg by mouth daily.     Marland Kitchen torsemide (DEMADEX) 20 MG tablet Take 3 tablets (60 mg total) by mouth daily. 90 tablet 6   No current facility-administered medications for this visit.      Past Medical History:  Diagnosis Date  . Arthritis    "left foot" (06/01/2018)  . Atrial fibrillation ([place])   . CAD (coronary artery disease)   . Cardiomyopathy ([place])   . Chronic systolic CHF (congestive heart failure) ([place]) 04/12/2016  . Dyspnea   . Dysrhythmia Atrial flutter  . 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 Hypertension     Past Surgical History:  Procedure Laterality Date  . CARDIOVERSION N/A 09/04/2015   Procedure: CARDIOVERSION;  Surgeon: Skeet Latch, MD;  Location: [place];  Service: Cardiovascular;  Laterality: N/A;  . IR THORACENTESIS ASP PLEURAL SPACE W/IMG GUIDE  06/02/2018  . TEE WITHOUT CARDIOVERSION N/A 09/04/2015   Procedure: TRANSESOPHAGEAL ECHOCARDIOGRAM (TEE);  Surgeon: Skeet Latch, MD;  Location: [place];  Service: Cardiovascular;  Laterality: N/A;  . TEE WITHOUT CARDIOVERSION N/A 04/15/2016   Procedure: TRANSESOPHAGEAL ECHOCARDIOGRAM (TEE);  Surgeon: Jerline Pain, MD;  Location[facility] ENDOSCOPY;  Service: Cardiovascular;  Laterality: N/A;  . TOE AMPUTATION Right    4th digit    Social History   Socioeconomic History  . Marital status: Married    Spouse name: Not on file  . Number of children: 2  . Years of education: Not on file  . Highest education level: Not on file  Occupational History  .  Not on file  Social Needs  . Financial resource strain: Not on file  . Food insecurity:    Worry: Not on file    Inability: Not on file  . Transportation needs:    Medical: Not on file    Non-medical: Not on file  Tobacco Use  . Smoking status: Former Smoker    Packs/day: 0.12    Types: Cigarettes    Last attempt to quit: 1990    Years since quitting: 30.0  . Smokeless tobacco: Never Used  .  Tobacco comment: 06/01/2018 "smoked off and on for 30 years; don't know how many years I actually smoked"  Substance and Sexual Activity  . Alcohol use: Not Currently    Comment: 06/01/2018 "nothing in the last 10 years or more"  . Drug use: Never  . Sexual activity: Not on file  Lifestyle  . Physical activity:    Days per week: Not on file    Minutes per session: Not on file  . Stress: Not on file  Relationships  . Social connections:    Talks on phone: Not on file    Gets together: Not on file    Attends religious service: Not on file    Active member of club or organization: Not on file    Attends meetings of clubs or organizations: Not on file    Relationship status: Not on file  . Intimate partner violence:    Fear of current or ex partner: Not on file    Emotionally abused: Not on file    Physically abused: Not on file    Forced sexual activity: Not on file  Other Topics Concern  . Not on file  Social History Narrative  . Not on file    Family History  Problem Relation Age of Onset  . Heart disease Other        No family history    ROS: no fevers or chills, productive cough, hemoptysis, dysphasia, odynophagia, melena, hematochezia, dysuria, hematuria, rash, seizure activity, orthopnea, PND, pedal edema, claudication. Remaining systems are negative.  Physical Exam: Well-developed well-nourished in no acute distress.  Skin is warm and dry.  HEENT is normal.  Neck is supple.  Chest with diminished breath sounds at bases Cardiovascular exam is regular rate and rhythm.  Abdominal exam nontender or distended. No masses palpated. Extremities show no edema. neuro grossly intact  ECG-sinus rhythm at a rate of 77.  QTc 0.477 personally reviewed  A/P  1 chronic combined systolic/diastolic congestive heart failure-patient is markedly improved.  He may be over diuresed as he has lost an additional 7 pounds in the past week since discharge from the hospital.  He also  describes some weakness.  I will decrease Demadex to 40 mg daily.  He will take an additional 20 mg for weight gain of 2 to 3 pounds over 215 which was his discharge weight. Check potassium and renal function.  Continue fluid restriction and low-sodium diet.  2 amyloidosis-continue tafamidis.   3 paroxysmal atrial fibrillation-renal function was worse during recent hospitalization and tikosyn reduced.  QT is unchanged compared to recent hospitalization.  We will continue with present dose.  Continue apixaban.  Continue beta-blocker.  He will also be followed by Dr. Lovena Le.  4 hypertension-patient's blood pressure is controlled.  Continue present medications and follow.  5 coronary artery disease-based on previous calcium noted on CT scan.  Continue statin.  No aspirin given need for anticoagulation.  6 cardiomyopathy-continue beta-blocker; blood pressure  borderline.  Will not add ARB.  7 chronic stage III kidney disease-need to follow renal function closely given need for continued aggressive diuresis.  Kirk Ruths, MD

## 2018-06-09 ENCOUNTER — Telehealth: Payer: Self-pay | Admitting: Cardiology

## 2018-06-09 ENCOUNTER — Encounter: Payer: Self-pay | Admitting: *Deleted

## 2018-06-09 ENCOUNTER — Telehealth: Payer: Self-pay

## 2018-06-09 NOTE — Telephone Encounter (Signed)
[place] for letter Kirk Ruths

## 2018-06-09 NOTE — Telephone Encounter (Signed)
Patient aware letter generated and placed at the front desk for pick up.

## 2018-06-09 NOTE — Telephone Encounter (Signed)
No action needed.  Letter written by Dr. Stanford Breed.

## 2018-06-09 NOTE — Telephone Encounter (Signed)
Returned call and spoke with pt wife. The pt was d/c from the Hospital on 06/07/18 and scheduled for f/u with Dr.Crenshaw on 06/15/18. The pt is needing a letter to remain out of work atleast until his appt on 06/15/18. The pt works [employer] and remains sedentary during his shift. Adv pt that that I will fwd the rqst to Dr.Crenshaw and call back with his response.

## 2018-06-09 NOTE — Telephone Encounter (Signed)
-----   Message from Vanice Sarah, [place] sent at 06/08/2018  1:58 PM EST ----- Regarding: Back to work note Pt called stating that he would like a back to work note asap. Pt was recently in the hospital and Dr. Lovena Le has seen pt numerous times. Pt is in the process of getting established with CHF clinic. Can you and Dr. Lovena Le address this?  Thanks,  Dyke Maes, CMA

## 2018-06-09 NOTE — Telephone Encounter (Signed)
New Message   Patient calling in to obtain a return to work letter if possible.

## 2018-06-15 ENCOUNTER — Ambulatory Visit: Payer: Non-veteran care | Admitting: Cardiology

## 2018-06-15 ENCOUNTER — Ambulatory Visit (INDEPENDENT_AMBULATORY_CARE_PROVIDER_SITE_OTHER): Payer: No Typology Code available for payment source | Admitting: Cardiology

## 2018-06-15 ENCOUNTER — Encounter: Payer: Self-pay | Admitting: Cardiology

## 2018-06-15 VITALS — BP 110/70 | HR 78 | Ht 71.0 in | Wt 217.0 lb

## 2018-06-15 DIAGNOSIS — I480 Paroxysmal atrial fibrillation: Secondary | ICD-10-CM

## 2018-06-15 DIAGNOSIS — I5042 Chronic combined systolic (congestive) and diastolic (congestive) heart failure: Secondary | ICD-10-CM

## 2018-06-15 DIAGNOSIS — I43 Cardiomyopathy in diseases classified elsewhere: Secondary | ICD-10-CM

## 2018-06-15 DIAGNOSIS — I2510 Atherosclerotic heart disease of native coronary artery without angina pectoris: Secondary | ICD-10-CM | POA: Diagnosis not present

## 2018-06-15 DIAGNOSIS — E854 Organ-limited amyloidosis: Secondary | ICD-10-CM

## 2018-06-15 DIAGNOSIS — I10 Essential (primary) hypertension: Secondary | ICD-10-CM | POA: Diagnosis not present

## 2018-06-15 MED ORDER — TORSEMIDE 20 MG PO TABS: ORAL_TABLET | ORAL | 3 refills | Status: DC

## 2018-06-15 NOTE — Addendum Note (Signed)
Addended by: Patterson Hammersmith A on: 06/15/2018 08:48 AM   Modules accepted: Orders

## 2018-06-15 NOTE — Patient Instructions (Signed)
Medication Instructions:   TAKE 40 MG OF DEMADEX ONCE DAILY AND AN EXTRA 20 MG AS NEEDED FOR WEIGHT OF 217 LB  Labwork:  Your physician recommends that you HAVE LAB WORK TODAY  Follow-Up:  Your physician recommends that you schedule a follow-up appointment in: [place]

## 2018-06-16 ENCOUNTER — Telehealth: Payer: Self-pay | Admitting: Cardiology

## 2018-06-16 ENCOUNTER — Ambulatory Visit (HOSPITAL_COMMUNITY)
Admit: 2018-06-16 | Discharge: 2018-06-16 | Disposition: A | Discharge status: Home / Self Care | Payer: Medicare Other | Attending: Internal Medicine | Admitting: Internal Medicine

## 2018-06-16 ENCOUNTER — Encounter (HOSPITAL_COMMUNITY): Payer: Self-pay

## 2018-06-16 DIAGNOSIS — I5042 Chronic combined systolic (congestive) and diastolic (congestive) heart failure: Secondary | ICD-10-CM

## 2018-06-16 LAB — BASIC METABOLIC PANEL
BUN/Creatinine Ratio: 33 — ABNORMAL HIGH (ref 10–24)
BUN: 70 mg/dL — ABNORMAL HIGH (ref 8–27)
CO2: 24 mmol/L (ref 20–29)
Calcium: 10.8 mg/dL — ABNORMAL HIGH (ref 8.6–10.2)
Chloride: 87 mmol/L — ABNORMAL LOW (ref 96–106)
Creatinine, Ser: 2.14 mg/dL — ABNORMAL HIGH (ref 0.76–1.27)
GFR calc Af Amer: 33 mL/min/{1.73_m2} — ABNORMAL LOW (ref >59)
GFR, EST NON AFRICAN AMERICAN: 29 mL/min/{1.73_m2} — AB (ref >59)
Glucose: 192 mg/dL — ABNORMAL HIGH (ref 65–99)
Potassium: 4 mmol/L (ref 3.5–5.2)
Sodium: 132 mmol/L — ABNORMAL LOW (ref 134–144)

## 2018-06-16 MED ORDER — TORSEMIDE 20 MG PO TABS: ORAL_TABLET | ORAL | 3 refills | Status: DC

## 2018-06-16 MED ORDER — POTASSIUM CHLORIDE CRYS ER 10 MEQ PO TBCR: 20.0000 meq | EXTENDED_RELEASE_TABLET | Freq: Two times a day (BID) | ORAL | 6 refills | Status: DC

## 2018-06-16 NOTE — Telephone Encounter (Addendum)
Spoke with pt wife, Aware of dr [name] recommendations. He is seeing the heart failure clinic today and will forward to them. Lab orders mailed to the pt    ----- Message from Lelon Perla, MD sent at 06/16/2018  7:27 AM EST ----- Hold Demadex, K-Dur, spironolactone for 48 hours.  Then resume Demadex at 40 mg daily and K-Dur 20 mEq daily.  Continue to hold spironolactone.  Bmet 1 week. Kirk Ruths, MD

## 2018-06-28 ENCOUNTER — Ambulatory Visit (HOSPITAL_COMMUNITY): Payer: No Typology Code available for payment source | Admitting: Nurse Practitioner

## 2018-06-28 ENCOUNTER — Encounter (HOSPITAL_COMMUNITY): Payer: Self-pay | Admitting: Nurse Practitioner

## 2018-06-28 ENCOUNTER — Ambulatory Visit (HOSPITAL_COMMUNITY)
Admission: RE | Admit: 2018-06-28 | Discharge: 2018-06-28 | Disposition: A | Discharge status: Home / Self Care | Payer: No Typology Code available for payment source | Source: Ambulatory Visit | Attending: Nurse Practitioner | Admitting: Nurse Practitioner

## 2018-06-28 VITALS — BP 112/64 | HR 72 | Ht 71.0 in | Wt 219.0 lb

## 2018-06-28 DIAGNOSIS — I480 Paroxysmal atrial fibrillation: Secondary | ICD-10-CM

## 2018-06-28 DIAGNOSIS — I4819 Other persistent atrial fibrillation: Secondary | ICD-10-CM | POA: Diagnosis not present

## 2018-06-28 DIAGNOSIS — Z87891 Personal history of nicotine dependence: Secondary | ICD-10-CM | POA: Insufficient documentation

## 2018-06-28 DIAGNOSIS — Z888 Allergy status to other drugs, medicaments and biological substances status: Secondary | ICD-10-CM | POA: Diagnosis not present

## 2018-06-28 DIAGNOSIS — I5022 Chronic systolic (congestive) heart failure: Secondary | ICD-10-CM | POA: Diagnosis not present

## 2018-06-28 DIAGNOSIS — I4892 Unspecified atrial flutter: Secondary | ICD-10-CM | POA: Diagnosis not present

## 2018-06-28 DIAGNOSIS — I110 Hypertensive heart disease with heart failure: Secondary | ICD-10-CM | POA: Diagnosis not present

## 2018-06-28 DIAGNOSIS — I2510 Atherosclerotic heart disease of native coronary artery without angina pectoris: Secondary | ICD-10-CM | POA: Diagnosis not present

## 2018-06-28 DIAGNOSIS — I4891 Unspecified atrial fibrillation: Secondary | ICD-10-CM | POA: Diagnosis present

## 2018-06-28 DIAGNOSIS — Z79899 Other long term (current) drug therapy: Secondary | ICD-10-CM | POA: Insufficient documentation

## 2018-06-28 DIAGNOSIS — I429 Cardiomyopathy, unspecified: Secondary | ICD-10-CM | POA: Diagnosis not present

## 2018-06-28 DIAGNOSIS — Z7901 Long term (current) use of anticoagulants: Secondary | ICD-10-CM | POA: Diagnosis not present

## 2018-06-28 DIAGNOSIS — M1990 Unspecified osteoarthritis, unspecified site: Secondary | ICD-10-CM | POA: Diagnosis not present

## 2018-06-28 LAB — BASIC METABOLIC PANEL
Anion gap: 11 (ref 5–15)
BUN: 27 mg/dL — ABNORMAL HIGH (ref 8–23)
CO2: 22 mmol/L (ref 22–32)
Calcium: 9.6 mg/dL (ref 8.9–10.3)
Chloride: 103 mmol/L (ref 98–111)
Creatinine, Ser: 1.36 mg/dL — ABNORMAL HIGH (ref 0.61–1.24)
GFR calc Af Amer: 58 mL/min — ABNORMAL LOW (ref >60)
GFR calc non Af Amer: 50 mL/min — ABNORMAL LOW (ref >60)
Glucose, Bld: 113 mg/dL — ABNORMAL HIGH (ref 70–99)
POTASSIUM: 3.9 mmol/L (ref 3.5–5.1)
Sodium: 136 mmol/L (ref 135–145)

## 2018-06-28 LAB — MAGNESIUM: Magnesium: 2.3 mg/dL (ref 1.7–2.4)

## 2018-06-28 NOTE — Progress Notes (Signed)
Primary Care Physician: [facility] Cardiologist: Dr. Farrel Gordon is a 78 y.o. male   that was hospitalized 04/12/16 thru 04/18/16 with EF of 45%, hypertension who presented from home to [place] EDwith shortness of breath which has been progressively worsening over the past one month. He went to [place] 03/22/16 and had chest x-ray which showed new moderate right-sided pleural effusion with right middle and lower lobe collapse / consolidation. He was treated with Lasix and antibiotics. He felt better for at least 2 weeks. Then over the last 1 week, he again felt short of breath with exertion, 2 pillow orthopnea and swelling in his feet and legs in the last 1 week. He underwent u/s guided thoracentesis with 1.3L removed. Initially diuresed with IV lasix and transitioned to po, now uses as needed.  EF of 35-40% thought to be exacerbated by atrial arrhythmia and decision was made to load on tikosyn and was d/c in SR.   F/U 06/28/18. He was recently admitted for acute on chronic systolic heart failure on 05/31/18. During his admission, his QT interval lengthened and his Tikosyn dose was decreased from 500 to 125 mcg. He was diuresed and lost 38 lbs of fluid. He reports that his energy level and SOB have greatly improved. No lower extremity edema.   Today, he denies symptoms of palpitations, chest pain, shortness of breath, orthopnea, PND, lower extremity edema, dizziness, presyncope, syncope, or neurologic sequela. The patient is tolerating medications without difficulties and is otherwise without complaint today.   Past Medical History:  Diagnosis Date  . Arthritis    "left foot" (06/01/2018)  . Atrial fibrillation ([place])   . CAD (coronary artery disease)   . Cardiomyopathy ([place])   . Chronic systolic CHF (congestive heart failure) ([place]) 04/12/2016  . Dyspnea   . Dysrhythmia Atrial flutter  . Hepatitis 1960   "? kind; went to hospital; got alot of shots" (06/01/2018)   . Hypertension    Past Surgical History:  Procedure Laterality Date  . CARDIOVERSION N/A 09/04/2015   Procedure: CARDIOVERSION;  Surgeon: Skeet Latch, MD;  Location: [place];  Service: Cardiovascular;  Laterality: N/A;  . IR THORACENTESIS ASP PLEURAL SPACE W/IMG GUIDE  06/02/2018  . TEE WITHOUT CARDIOVERSION N/A 09/04/2015   Procedure: TRANSESOPHAGEAL ECHOCARDIOGRAM (TEE);  Surgeon: Skeet Latch, MD;  Location: [place];  Service: Cardiovascular;  Laterality: N/A;  . TEE WITHOUT CARDIOVERSION N/A 04/15/2016   Procedure: TRANSESOPHAGEAL ECHOCARDIOGRAM (TEE);  Surgeon: Jerline Pain, MD;  Location: [facility] ENDOSCOPY;  Service: Cardiovascular;  Laterality: N/A;  . TOE AMPUTATION Right    4th digit    Current Outpatient Medications  Medication Sig Dispense Refill  . apixaban (ELIQUIS) 5 MG TABS tablet Take 1 tablet (5 mg total) by mouth 2 (two) times daily. 60 tablet 6  . carvedilol (COREG) 3.125 MG tablet Take 1 tablet (3.125 mg total) by mouth 2 (two) times daily with a meal. 60 tablet 6  . dofetilide (TIKOSYN) 125 MCG capsule Take 1 capsule (125 mcg total) by mouth 2 (two) times daily. 60 capsule 6  . dorzolamide (TRUSOPT) 2 % ophthalmic solution Place 1 drop into both eyes 2 (two) times daily.    Marland Kitchen latanoprost (XALATAN) 0.005 % ophthalmic solution Place 1 drop into both eyes at bedtime.    . Multiple Vitamins-Minerals (MULTIVITAMIN WITH MINERALS) tablet Take 1 tablet by mouth daily.    . potassium chloride (K-DUR,KLOR-CON) 10 MEQ tablet Take 2 tablets (20 mEq  total) by mouth 2 (two) times daily. 06-16-18 hold for 48 hours 120 tablet 6  . Tafamidis 61 MG CAPS Take 61 mg by mouth daily.     Marland Kitchen torsemide (DEMADEX) 20 MG tablet 2 TABLETS BY MOUTH ONCE DAILY AND TAKE AN EXTRA 20 MG As needed when weight = 217 lb 06-16-18 hold for 48 hours 270 tablet 3   No current facility-administered medications for this encounter.     Allergies  Allergen Reactions  . Pradaxa [Dabigatran Etexilate  Mesylate] Nausea Only    Social History   Socioeconomic History  . Marital status: Married    Spouse name: Not on file  . Number of children: 2  . Years of education: Not on file  . Highest education level: Not on file  Occupational History  . Not on file  Social Needs  . Financial resource strain: Not on file  . Food insecurity:    Worry: Not on file    Inability: Not on file  . Transportation needs:    Medical: Not on file    Non-medical: Not on file  Tobacco Use  . Smoking status: Former Smoker    Packs/day: 0.12    Types: Cigarettes    Last attempt to quit: 1990    Years since quitting: 30.0  . Smokeless tobacco: Never Used  . Tobacco comment: 06/01/2018 "smoked off and on for 30 years; don't know how many years I actually smoked"  Substance and Sexual Activity  . Alcohol use: Not Currently    Comment: 06/01/2018 "nothing in the last 10 years or more"  . Drug use: Never  . Sexual activity: Not on file  Lifestyle  . Physical activity:    Days per week: Not on file    Minutes per session: Not on file  . Stress: Not on file  Relationships  . Social connections:    Talks on phone: Not on file    Gets together: Not on file    Attends religious service: Not on file    Active member of club or organization: Not on file    Attends meetings of clubs or organizations: Not on file    Relationship status: Not on file  . Intimate partner violence:    Fear of current or ex partner: Not on file    Emotionally abused: Not on file    Physically abused: Not on file    Forced sexual activity: Not on file  Other Topics Concern  . Not on file  Social History Narrative  . Not on file    Family History  Problem Relation Age of Onset  . Heart disease Other        No family history    ROS- All systems are reviewed and negative except as per the HPI above  Physical Exam: Vitals:   06/28/18 0842  BP: 112/64  Pulse: 72  Weight: 99.3 kg  Height: 5\' 11"  (1.803 m)    GEN-  The patient is well appearing, alert and oriented x 3 today.   HEENT-head normocephalic, atraumatic, sclera clear, conjunctiva pink, hearing intact, trachea midline. Lungs- Clear to ausculation bilaterally, normal work of breathing Heart- Regular rate and rhythm, no murmurs, rubs or gallops  GI- soft, NT, ND, + BS Extremities- no clubbing, cyanosis, or edema MS- no significant deformity or atrophy Skin- no rash or lesion Psych- euthymic mood, full affect Neuro- strength and sensation are intact   EKG-SR with first degree AV block, HR 72, PAC, PR  242, QRS 98, QTc 462 Epic records reviewed  Assessment and Plan: 1. Persistent afib/atypical flutter Recently decreased Tikosyn to 125 mcg BID 2/2 QT prolongation. Appears to be maintaining SR. Continue Eliquis 5 mg BID Continue Coreg 3.125 mg BID Check Bmet/mag today  This patients CHA2DS2-VASc Score and unadjusted Ischemic Stroke Rate (% per year) is equal to 7.2 % stroke rate/year from a score of 5  Above score calculated as 1 point each if present [CHF, HTN, DM, Vascular=MI/PAD/Aortic Plaque, Age if 65-74, or Male] Above score calculated as 2 points each if present [Age > 75, or Stroke/TIA/TE]   2. CHF Weight appears stable after recent hospitalization. No signs of overt fluid overload. Encouraged salt avoidence. Continue carvedilol Spironolactone on hold per Dr Stanford Breed. Recheck Bmet today.   3. CAD No anginal symptoms. Continue present therapy and risk factor modification.  4. HTN Stable, no changes today.  Follow up with Dr Lovena Le in 4 weeks. Follow up with Dr Stanford Breed as scheduled.  [facility] [address] [place], [place] [address] [phone]

## 2018-07-01 ENCOUNTER — Encounter (HOSPITAL_COMMUNITY): Payer: Self-pay | Admitting: Physician Assistant

## 2018-07-05 ENCOUNTER — Encounter (HOSPITAL_COMMUNITY): Payer: No Typology Code available for payment source

## 2018-07-20 ENCOUNTER — Encounter (HOSPITAL_COMMUNITY): Payer: No Typology Code available for payment source

## 2018-07-26 ENCOUNTER — Encounter: Payer: Self-pay | Admitting: Internal Medicine

## 2018-07-26 ENCOUNTER — Ambulatory Visit (INDEPENDENT_AMBULATORY_CARE_PROVIDER_SITE_OTHER): Payer: No Typology Code available for payment source | Admitting: Internal Medicine

## 2018-07-26 VITALS — BP 110/70 | HR 70 | Ht 71.0 in | Wt 238.4 lb

## 2018-07-26 DIAGNOSIS — I484 Atypical atrial flutter: Secondary | ICD-10-CM | POA: Diagnosis not present

## 2018-07-26 DIAGNOSIS — I2510 Atherosclerotic heart disease of native coronary artery without angina pectoris: Secondary | ICD-10-CM | POA: Diagnosis not present

## 2018-07-26 DIAGNOSIS — I5042 Chronic combined systolic (congestive) and diastolic (congestive) heart failure: Secondary | ICD-10-CM

## 2018-07-26 DIAGNOSIS — I480 Paroxysmal atrial fibrillation: Secondary | ICD-10-CM

## 2018-07-26 DIAGNOSIS — I10 Essential (primary) hypertension: Secondary | ICD-10-CM

## 2018-07-26 NOTE — Progress Notes (Signed)
HPI Christopher Benton returns today for followup. I had seen him back in the hospital when his dose of dofetilide was downtitrated. He has done well in the interim with no recurrent symptomatic atrial fib. He has started back exercising. He denies chest pain, sob, or edema. No symptomatic atrial fib.  Allergies  Allergen Reactions  . Pradaxa [Dabigatran Etexilate Mesylate] Nausea Only     Current Outpatient Medications  Medication Sig Dispense Refill  . apixaban (ELIQUIS) 5 MG TABS tablet Take 1 tablet (5 mg total) by mouth 2 (two) times daily. 60 tablet 6  . carvedilol (COREG) 3.125 MG tablet Take 1 tablet (3.125 mg total) by mouth 2 (two) times daily with a meal. 60 tablet 6  . dofetilide (TIKOSYN) 125 MCG capsule Take 1 capsule (125 mcg total) by mouth 2 (two) times daily. 60 capsule 6  . dorzolamide (TRUSOPT) 2 % ophthalmic solution Place 1 drop into both eyes 2 (two) times daily.    Marland Kitchen latanoprost (XALATAN) 0.005 % ophthalmic solution Place 1 drop into both eyes at bedtime.    . Multiple Vitamins-Minerals (MULTIVITAMIN WITH MINERALS) tablet Take 1 tablet by mouth daily.    . potassium chloride (K-DUR,KLOR-CON) 10 MEQ tablet Take 2 tablets (20 mEq total) by mouth 2 (two) times daily. 06-16-18 hold for 48 hours 120 tablet 6  . Tafamidis 61 MG CAPS Take 61 mg by mouth daily.     Marland Kitchen torsemide (DEMADEX) 20 MG tablet 2 TABLETS BY MOUTH ONCE DAILY AND TAKE AN EXTRA 20 MG As needed when weight = 217 lb 06-16-18 hold for 48 hours 270 tablet 3   No current facility-administered medications for this visit.      Past Medical History:  Diagnosis Date  . Arthritis    "left foot" (06/01/2018)  . Atrial fibrillation ([place])   . CAD (coronary artery disease)   . Cardiomyopathy ([place])   . Chronic systolic CHF (congestive heart failure) ([place]) 04/12/2016  . Dyspnea   . Dysrhythmia Atrial flutter  . 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 Hypertension      ROS:   All systems reviewed and negative except as noted in the HPI.   Past Surgical History:  Procedure Laterality Date  . CARDIOVERSION N/A 09/04/2015   Procedure: CARDIOVERSION;  Surgeon: Skeet Latch, MD;  Location: [place];  Service: Cardiovascular;  Laterality: N/A;  . IR THORACENTESIS ASP PLEURAL SPACE W/IMG GUIDE  06/02/2018  . TEE WITHOUT CARDIOVERSION N/A 09/04/2015   Procedure: TRANSESOPHAGEAL ECHOCARDIOGRAM (TEE);  Surgeon: Skeet Latch, MD;  Location: [place];  Service: Cardiovascular;  Laterality: N/A;  . TEE WITHOUT CARDIOVERSION N/A 04/15/2016   Procedure: TRANSESOPHAGEAL ECHOCARDIOGRAM (TEE);  Surgeon: Jerline Pain, MD;  Location: [facility] ENDOSCOPY;  Service: Cardiovascular;  Laterality: N/A;  . TOE AMPUTATION Right    4th digit     Family History  Problem Relation Age of Onset  . Heart disease Other        No family history     Social History   Socioeconomic History  . Marital status: Married    Spouse name: Not on file  . Number of children: 2  . Years of education: Not on file  . Highest education level: Not on file  Occupational History  . Not on file  Social Needs  . Financial resource strain: Not on file  . Food insecurity:    Worry: Not on file  Inability: Not on file  . Transportation needs:    Medical: Not on file    Non-medical: Not on file  Tobacco Use  . Smoking status: Former Smoker    Packs/day: 0.12    Types: Cigarettes    Last attempt to quit: 1990    Years since quitting: 30.1  . Smokeless tobacco: Never Used  . Tobacco comment: 06/01/2018 "smoked off and on for 30 years; don't know how many years I actually smoked"  Substance and Sexual Activity  . Alcohol use: Not Currently    Comment: 06/01/2018 "nothing in the last 10 years or more"  . Drug use: Never  . Sexual activity: Not on file  Lifestyle  . Physical activity:    Days per week: Not on file    Minutes per session: Not on file  . Stress: Not on file   Relationships  . Social connections:    Talks on phone: Not on file    Gets together: Not on file    Attends religious service: Not on file    Active member of club or organization: Not on file    Attends meetings of clubs or organizations: Not on file    Relationship status: Not on file  . Intimate partner violence:    Fear of current or ex partner: Not on file    Emotionally abused: Not on file    Physically abused: Not on file    Forced sexual activity: Not on file  Other Topics Concern  . Not on file  Social History Narrative  . Not on file     BP 110/70   Pulse 70   Ht 5\' 11"  (1.803 m)   Wt 238 lb 6.4 oz (108.1 kg)   SpO2 97%   BMI 33.25 kg/m   Physical Exam:  Well appearing NAD HEENT: Unremarkable Neck:  No JVD, no thyromegally Lymphatics:  No adenopathy Back:  No CVA tenderness Lungs:  Clear HEART:  Regular rate rhythm, no murmurs, no rubs, no clicks Abd:  soft, positive bowel sounds, no organomegally, no rebound, no guarding Ext:  2 plus pulses, no edema, no cyanosis, no clubbing Skin:  No rashes no nodules Neuro:  CN II through XII intact, motor grossly intact  EKG - nsr with PAC's   Assess/Plan: 1. PAF - he is maintaining NSR on low dose dofetilide. He will continue his current meds. 2. Combined systolic/diastolic CHF - he is really class 1 at this point although he is on high dose medical therapy.  3. Amyloidosis - he will continue tafamidis. 4. CAD - he is exercising and denies anginal symptoms.   Christopher Benton.D.

## 2018-07-26 NOTE — Patient Instructions (Signed)

## 2018-09-13 NOTE — Progress Notes (Signed)
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 Had stress test 8/17 at [facility] that showed EF 41 and no ischemia. Pt admitted 11/17 with dyspnea and right pleural effusion. Had thoracentesis and cytology negative; diuresed. TEE 11/17 showed EF 35-40, biatrial enlargement and moderate TR. Echocardiogram October 2019 showed ejection fraction 45 to 50%; severe left ventricular hypertrophy and amyloid should be considered; moderate diastolic dysfunction; mildly dilated ascending aorta, biatrial enlargement and mild tricuspid regurgitation. CTA October 2019 showed large right pleural effusion. 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 Admitted with CHF 12/19; had R thoracentesis. Diuresed with improvement. Tikosyn dose reduced due to worsening renal insuff. Since last seen,pt has dyspnea with more vigorous activites; no pedal edema; no CP or palpitations.  The patient does not have symptoms concerning for COVID-19 infection (fever, chills, cough, or new shortness of breath).    Past Medical History:  Diagnosis Date  . Arthritis    "left foot" (06/01/2018)  . Atrial fibrillation ([place])   . CAD (coronary artery disease)   . Cardiomyopathy ([place])   . Chronic systolic CHF (congestive heart failure) ([place]) 04/12/2016  . Dyspnea   . Dysrhythmia Atrial flutter  . 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 Hypertension    Past Surgical History:  Procedure Laterality Date  .  CARDIOVERSION N/A 09/04/2015   Procedure: CARDIOVERSION;  Surgeon: Skeet Latch, MD;  Location: [place];  Service: Cardiovascular;  Laterality: N/A;  . IR THORACENTESIS ASP PLEURAL SPACE W/IMG GUIDE  06/02/2018  . TEE WITHOUT CARDIOVERSION N/A 09/04/2015   Procedure: TRANSESOPHAGEAL ECHOCARDIOGRAM (TEE);  Surgeon: Skeet Latch, MD;  Location: [place];  Service: Cardiovascular;  Laterality: N/A;  . TEE WITHOUT CARDIOVERSION N/A 04/15/2016   Procedure: TRANSESOPHAGEAL ECHOCARDIOGRAM (TEE);  Surgeon: Jerline Pain, MD;  Location: [facility] ENDOSCOPY;  Service: Cardiovascular;  Laterality: N/A;  . TOE AMPUTATION Right    4th digit     Current Meds  Medication Sig  . apixaban (ELIQUIS) 5 MG TABS tablet Take 1 tablet (5 mg total) by mouth 2 (two) times daily.  . carvedilol (COREG) 3.125 MG tablet Take 1 tablet (3.125 mg total) by mouth 2 (two) times daily with a meal.  . dofetilide (TIKOSYN) 125 MCG capsule Take 1 capsule (125 mcg total) by mouth 2 (two) times daily.  . dorzolamide (TRUSOPT) 2 % ophthalmic solution Place 1 drop into both eyes 2 (two) times daily.  Marland Kitchen latanoprost (XALATAN) 0.005 % ophthalmic solution Place 1 drop into both eyes at bedtime.  . Multiple Vitamins-Minerals (MULTIVITAMIN WITH MINERALS) tablet Take 1 tablet by mouth daily.  . potassium chloride SA (K-DUR,KLOR-CON) 20 MEQ tablet Take 20 mEq by mouth 2 (two) times daily.  . Tafamidis 61 MG CAPS Take 61 mg by mouth daily.   Marland Kitchen torsemide (DEMADEX) 20 MG tablet 2 TABLETS BY MOUTH ONCE DAILY AND TAKE AN EXTRA 20 MG As needed when weight = 217 lb 06-16-18 hold for 48 hours  . [DISCONTINUED] potassium chloride (K-DUR,KLOR-CON) 10 MEQ tablet Take  2 tablets (20 mEq total) by mouth 2 (two) times daily. 06-16-18 hold for 48 hours     Allergies:   Pradaxa [dabigatran etexilate mesylate] and Penicillins   Social History   Tobacco Use  . Smoking status: Former Smoker    Packs/day: 0.12    Types: Cigarettes    Last attempt to  quit: 1990    Years since quitting: 30.3  . Smokeless tobacco: Never Used  . Tobacco comment: 06/01/2018 "smoked off and on for 30 years; don't know how many years I actually smoked"  Substance Use Topics  . Alcohol use: Not Currently    Comment: 06/01/2018 "nothing in the last 10 years or more"  . Drug use: Never     Family Hx: The patient's family history includes Heart disease in an other family member.  ROS:   Please see the history of present illness.    No F/C, productive cough; mild hematocheezia All other systems reviewed and are negative.   Recent Labs: 03/08/2018: TSH 4.431 05/31/2018: B Natriuretic Peptide 502.8 06/02/2018: ALT 14 06/07/2018: Hemoglobin 16.5; Platelets 232 06/28/2018: BUN 27; Creatinine, Ser 1.36; Magnesium 2.3; Potassium 3.9; Sodium 136   Recent Lipid Panel Lab Results  Component Value Date/Time   CHOL 88 (L) 12/25/2015 08:11 AM   TRIG 147 12/25/2015 08:11 AM   HDL 28 (L) 12/25/2015 08:11 AM   CHOLHDL 3.1 12/25/2015 08:11 AM   LDLCALC 31 12/25/2015 08:11 AM    Wt Readings from Last 3 Encounters:  09/14/18 222 lb 9.6 oz (101 kg)  07/26/18 238 lb 6.4 oz (108.1 kg)  06/28/18 219 lb (99.3 kg)     Objective:    Vital Signs:  BP 116/80   Pulse 75   Ht 5' 11.5" (1.816 m)   Wt 222 lb 9.6 oz (101 kg)   BMI 30.61 kg/m    Well nourished, well developed male in no acute distress. Remainder of exam not performed due to telehealth visit (corona virus pandemic).   ASSESSMENT & PLAN:    1. Chronic combined systolic/diastolic CHF-pt appears to be doing well from a volume standpoint. Continue present dose of demadex. Continue fluid restriction and low Na diet. Take additional 20 mg demadex as needed for weight gain of 2-3 lbs. Check bmet.  2. Amyloidosis-continue tafamidis; Pt would like fu in [place]; arrange fu in amyloid clinic. 3. PAF-continue apixaban and beta blocker; continue tikosyn. Check Mg, K and CBC. 4. Hypertension-BP is controlled; continue  present meds and follow. 5. CAD-previous CT showed coronary ca. No ASA given need for apixaban. Add crestor 20 mg daily; lipids and liver six weeks. 6. CM-continue beta blocker. Repeat echo in 3 months when corona virus pandemic passes. 7. Chronic stage 3 kidney disease-check Bmet. 8. Hematochezia-mild by pt report; referred to primary care; will likely need colonoscopy.   COVID-19 Education: The importance of social distancing was discussed today.  Time:   Today, I have spent 25 minutes with the patient with telehealth technology discussing the above problems.     Medication Adjustments/Labs and Tests Ordered: Current medicines are reviewed at length with the patient today.  Concerns regarding medicines are outlined above.   Tests Ordered: No orders of the defined types were placed in this encounter.   Medication Changes: No orders of the defined types were placed in this encounter.   Disposition:  Follow up 3-4 months.  Signed, Kirk Ruths, MD  09/14/2018 8:07 AM    [place]

## 2018-09-14 ENCOUNTER — Encounter: Payer: Self-pay | Admitting: Cardiology

## 2018-09-14 ENCOUNTER — Other Ambulatory Visit: Payer: Self-pay

## 2018-09-14 ENCOUNTER — Telehealth (INDEPENDENT_AMBULATORY_CARE_PROVIDER_SITE_OTHER): Payer: No Typology Code available for payment source | Admitting: Cardiology

## 2018-09-14 ENCOUNTER — Telehealth (HOSPITAL_COMMUNITY): Payer: Self-pay | Admitting: Vascular Surgery

## 2018-09-14 ENCOUNTER — Other Ambulatory Visit: Payer: Self-pay | Admitting: *Deleted

## 2018-09-14 VITALS — BP 116/80 | HR 75 | Ht 71.5 in | Wt 222.6 lb

## 2018-09-14 DIAGNOSIS — I2510 Atherosclerotic heart disease of native coronary artery without angina pectoris: Secondary | ICD-10-CM

## 2018-09-14 DIAGNOSIS — I480 Paroxysmal atrial fibrillation: Secondary | ICD-10-CM | POA: Diagnosis not present

## 2018-09-14 DIAGNOSIS — I5042 Chronic combined systolic (congestive) and diastolic (congestive) heart failure: Secondary | ICD-10-CM

## 2018-09-14 DIAGNOSIS — E859 Amyloidosis, unspecified: Secondary | ICD-10-CM

## 2018-09-14 MED ORDER — ROSUVASTATIN CALCIUM 20 MG PO TABS: 20.0000 mg | ORAL_TABLET | Freq: Every day | ORAL | 3 refills | Status: DC

## 2018-09-14 NOTE — Addendum Note (Signed)
Addended by: Cristopher Estimable on: 09/14/2018 08:51 AM   Modules accepted: Orders

## 2018-09-14 NOTE — Telephone Encounter (Signed)
Crestor rx printed and mailed to patient to take to [place]

## 2018-09-14 NOTE — Patient Instructions (Addendum)
Medication Instructions:  START ROSUVASTATIN 20 MG ONCE DAILY If you need a refill on your cardiac medications before your next appointment, please call your pharmacy.   Lab work: Your physician recommends that you return for lab work in: Christopher Benton If you have labs (blood work) drawn today and your tests are completely normal, you will receive your results only by: Christopher Benton MyChart Message (if you have MyChart) OR . A paper copy in the mail If you have any lab test that is abnormal or we need to change your treatment, we will call you to review the results.  Testing/Procedures: Your physician has requested that you have an echocardiogram. Echocardiography is a painless test that uses sound waves to create images of your heart. It provides your doctor with information about the size and shape of your heart and how well your heart's chambers and valves are working. This procedure takes approximately one hour. There are no restrictions for this procedure.  IN 3 MONTHS  Follow-Up: At [facility], you and your health needs are our priority.  As part of our continuing mission to provide you with exceptional heart care, we have created designated Provider Care Teams.  These Care Teams include your primary Cardiologist (physician) and Advanced Practice Providers (APPs -  Physician Assistants and Nurse Practitioners) who all work together to provide you with the care you need, when you need it. Your physician recommends that you schedule a follow-up appointment in: 3 MONTHS WITH DR [place] IN 4 MONTHS

## 2018-09-14 NOTE — Telephone Encounter (Signed)
Left pt to make 4 month f/u w/ db in Aug.asked pt to call office to make appt

## 2018-10-19 ENCOUNTER — Encounter (HOSPITAL_BASED_OUTPATIENT_CLINIC_OR_DEPARTMENT_OTHER): Payer: Self-pay

## 2018-10-19 ENCOUNTER — Emergency Department (HOSPITAL_BASED_OUTPATIENT_CLINIC_OR_DEPARTMENT_OTHER): Payer: No Typology Code available for payment source

## 2018-10-19 ENCOUNTER — Telehealth: Payer: Self-pay | Admitting: Cardiology

## 2018-10-19 ENCOUNTER — Emergency Department (HOSPITAL_BASED_OUTPATIENT_CLINIC_OR_DEPARTMENT_OTHER)
Admission: EM | Admit: 2018-10-19 | Discharge: 2018-10-19 | Disposition: A | Discharge status: Home / Self Care | Payer: No Typology Code available for payment source | Attending: Emergency Medicine | Admitting: Emergency Medicine

## 2018-10-19 ENCOUNTER — Other Ambulatory Visit: Payer: Self-pay

## 2018-10-19 DIAGNOSIS — I110 Hypertensive heart disease with heart failure: Secondary | ICD-10-CM | POA: Insufficient documentation

## 2018-10-19 DIAGNOSIS — Z87891 Personal history of nicotine dependence: Secondary | ICD-10-CM | POA: Diagnosis not present

## 2018-10-19 DIAGNOSIS — R0602 Shortness of breath: Secondary | ICD-10-CM

## 2018-10-19 DIAGNOSIS — I2510 Atherosclerotic heart disease of native coronary artery without angina pectoris: Secondary | ICD-10-CM | POA: Insufficient documentation

## 2018-10-19 DIAGNOSIS — Z79899 Other long term (current) drug therapy: Secondary | ICD-10-CM | POA: Diagnosis not present

## 2018-10-19 DIAGNOSIS — I509 Heart failure, unspecified: Secondary | ICD-10-CM

## 2018-10-19 DIAGNOSIS — I5043 Acute on chronic combined systolic (congestive) and diastolic (congestive) heart failure: Secondary | ICD-10-CM | POA: Diagnosis not present

## 2018-10-19 DIAGNOSIS — Z7901 Long term (current) use of anticoagulants: Secondary | ICD-10-CM | POA: Insufficient documentation

## 2018-10-19 LAB — CBC WITH DIFFERENTIAL/PLATELET
Abs Immature Granulocytes: 0.04 10*3/uL (ref 0.00–0.07)
Basophils Absolute: 0.1 10*3/uL (ref 0.0–0.1)
Basophils Relative: 1 %
Eosinophils Absolute: 0.3 10*3/uL (ref 0.0–0.5)
Eosinophils Relative: 4 %
HCT: 48.7 % (ref 39.0–52.0)
Hemoglobin: 15.5 g/dL (ref 13.0–17.0)
Immature Granulocytes: 0 %
Lymphocytes Relative: 22 %
Lymphs Abs: 2 10*3/uL (ref 0.7–4.0)
MCH: 27.9 pg (ref 26.0–34.0)
MCHC: 31.8 g/dL (ref 30.0–36.0)
MCV: 87.7 fL (ref 80.0–100.0)
Monocytes Absolute: 1.1 10*3/uL — ABNORMAL HIGH (ref 0.1–1.0)
Monocytes Relative: 12 %
Neutro Abs: 5.7 10*3/uL (ref 1.7–7.7)
Neutrophils Relative %: 61 %
Platelets: 205 10*3/uL (ref 150–400)
RBC: 5.55 MIL/uL (ref 4.22–5.81)
RDW: 13.1 % (ref 11.5–15.5)
Smear Review: NORMAL
WBC: 9.3 10*3/uL (ref 4.0–10.5)
nRBC: 0 % (ref 0.0–0.2)

## 2018-10-19 LAB — COMPREHENSIVE METABOLIC PANEL
ALT: 16 U/L (ref 0–44)
AST: 22 U/L (ref 15–41)
Albumin: 4.5 g/dL (ref 3.5–5.0)
Alkaline Phosphatase: 84 U/L (ref 38–126)
Anion gap: 9 (ref 5–15)
BUN: 24 mg/dL — ABNORMAL HIGH (ref 8–23)
CO2: 22 mmol/L (ref 22–32)
Calcium: 9.3 mg/dL (ref 8.9–10.3)
Chloride: 105 mmol/L (ref 98–111)
Creatinine, Ser: 1.28 mg/dL — ABNORMAL HIGH (ref 0.61–1.24)
GFR calc Af Amer: >60 mL/min (ref >60)
GFR calc non Af Amer: 54 mL/min — ABNORMAL LOW (ref >60)
Glucose, Bld: 123 mg/dL — ABNORMAL HIGH (ref 70–99)
Potassium: 3.9 mmol/L (ref 3.5–5.1)
Sodium: 136 mmol/L (ref 135–145)
Total Bilirubin: 3.4 mg/dL — ABNORMAL HIGH (ref 0.3–1.2)
Total Protein: 8 g/dL (ref 6.5–8.1)

## 2018-10-19 LAB — BRAIN NATRIURETIC PEPTIDE: B Natriuretic Peptide: 581.8 pg/mL — ABNORMAL HIGH (ref 0.0–100.0)

## 2018-10-19 LAB — TROPONIN I: Troponin I: 0.08 ng/mL (ref <0.03)

## 2018-10-19 MED ORDER — FUROSEMIDE 10 MG/ML IJ SOLN
40.0000 mg | Freq: Once | INTRAMUSCULAR | Status: AC
  Administered 2018-10-19: 40 mg via INTRAVENOUS
  Filled 2018-10-19: qty 4

## 2018-10-19 NOTE — ED Notes (Signed)
Ambulated on r/a HR 85-92, SpO2 94-96%, quick steady gait, VS updated once back to room 1.

## 2018-10-19 NOTE — ED Triage Notes (Addendum)
Pt with hx chf, reports increased shortness of breath over past day or two with minimal exertion.. Denies fever, denies cough

## 2018-10-19 NOTE — ED Notes (Signed)
Pt to xray

## 2018-10-19 NOTE — ED Notes (Signed)
Dr Ralene Bathe made aware of Troponin result 0.08

## 2018-10-19 NOTE — Telephone Encounter (Signed)
Patient came in today for lab work but was short of breath so was sent to the [place] ER to be checked.

## 2018-10-19 NOTE — Discharge Instructions (Addendum)
Take three tablets of torsemide daily (60 mg).  Call Dr. [name] office for close follow up in the next 48 hours.

## 2018-10-19 NOTE — Telephone Encounter (Signed)
Patient evaluated in ER.

## 2018-10-19 NOTE — ED Provider Notes (Signed)
[facility] EMERGENCY DEPARTMENT Provider Note   CSN: [id number] Arrival date & time: 10/19/18  1018    History   Chief Complaint Chief Complaint  Patient presents with  . Shortness of Breath    HPI Christopher Benton is a 78 y.o. male.     The history is provided by the patient, the spouse and medical records. No language interpreter was used.  Shortness of Breath   Christopher Benton is a 78 y.o. male who presents to the Emergency Department complaining of sob. He presents to the emergency department accompanied by his wife for evaluation of shortness of breath. He has chronic shortness of breath since being diagnosed with heart failure in 2017. He complains of significant worsening in his symptoms starting this morning. He has shortness of breath at rest as well as on exertion. He has a chronic cough productive of mucus, unchanged from baseline. He also complains of generalized weakness and malaise since last hospitalization in December. No recent medication changes or missed doses of medications. He does not weigh himself at home. He denies any fevers, abdominal pain, nausea, vomiting. He has chronic lower extremity edema, this is currently decreased from his baseline. No known coronavirus exposures. Past Medical History:  Diagnosis Date  . Arthritis    "left foot" (06/01/2018)  . Atrial fibrillation ([place])   . CAD (coronary artery disease)   . Cardiomyopathy ([place])   . Chronic systolic CHF (congestive heart failure) ([place]) 04/12/2016  . Dyspnea   . Dysrhythmia Atrial flutter  . 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 Hypertension     Patient Active Problem List   Diagnosis Date Noted  . Amyloidosis ([place]) 05/31/2018  . Pulmonary edema 05/31/2018  . Acute on chronic diastolic CHF (congestive heart failure) ([place]) 05/31/2018  . Acute on chronic systolic CHF (congestive heart failure) ([place])   . Shortness of breath 03/08/2018  . AKI  (acute kidney injury) ([place])   . Elevated troponin   . Pleural effusion on right 04/12/2016  . Acute on chronic systolic congestive heart failure ([place]) 04/12/2016  . CAD (coronary artery disease) 11/27/2015  . PAF (paroxysmal atrial fibrillation) ([place]) 09/25/2015  . Cardiomyopathy ([place]) 09/25/2015  . Typical atrial flutter ([place])   . Atypical atrial flutter ([place]) 08/28/2015  . Essential hypertension 08/28/2015    Past Surgical History:  Procedure Laterality Date  . CARDIOVERSION N/A 09/04/2015   Procedure: CARDIOVERSION;  Surgeon: Skeet Latch, MD;  Location: [place];  Service: Cardiovascular;  Laterality: N/A;  . IR THORACENTESIS ASP PLEURAL SPACE W/IMG GUIDE  06/02/2018  . TEE WITHOUT CARDIOVERSION N/A 09/04/2015   Procedure: TRANSESOPHAGEAL ECHOCARDIOGRAM (TEE);  Surgeon: Skeet Latch, MD;  Location: [place];  Service: Cardiovascular;  Laterality: N/A;  . TEE WITHOUT CARDIOVERSION N/A 04/15/2016   Procedure: TRANSESOPHAGEAL ECHOCARDIOGRAM (TEE);  Surgeon: Jerline Pain, MD;  Location: [facility] ENDOSCOPY;  Service: Cardiovascular;  Laterality: N/A;  . TOE AMPUTATION Right    4th digit        Home Medications    Prior to Admission medications   Medication Sig Start Date End Date Taking? Authorizing Provider  apixaban (ELIQUIS) 5 MG TABS tablet Take 1 tablet (5 mg total) by mouth 2 (two) times daily. 08/28/15   Lelon Perla, MD  carvedilol (COREG) 3.125 MG tablet Take 1 tablet (3.125 mg total) by mouth 2 (two) times daily with a meal. 06/07/18   Tillery, Satira Mccallum, PA-C  dofetilide (TIKOSYN) 125 MCG capsule Take 1 capsule (125 mcg total) by mouth 2 (two) times daily. 06/07/18   Shirley Friar, PA-C  dorzolamide (TRUSOPT) 2 % ophthalmic solution Place 1 drop into both eyes 2 (two) times daily.    [provider]  latanoprost (XALATAN) 0.005 % ophthalmic solution Place 1 drop into both eyes at bedtime.    [provider]  Multiple  Vitamins-Minerals (MULTIVITAMIN WITH MINERALS) tablet Take 1 tablet by mouth daily.    [provider]  potassium chloride SA (K-DUR,KLOR-CON) 20 MEQ tablet Take 20 mEq by mouth 2 (two) times daily.    [provider]  rosuvastatin (CRESTOR) 20 MG tablet Take 1 tablet (20 mg total) by mouth daily. 09/14/18 12/13/18  Skeet Latch, MD  Tafamidis 61 MG CAPS Take 61 mg by mouth daily.     [provider]  torsemide (DEMADEX) 20 MG tablet 2 TABLETS BY MOUTH ONCE DAILY AND TAKE AN EXTRA 20 MG As needed when weight = 217 lb 06-16-18 hold for 48 hours 06/16/18   Lelon Perla, MD    Family History Family History  Problem Relation Age of Onset  . Heart disease Other        No family history    Social History Social History   Tobacco Use  . Smoking status: Former Smoker    Packs/day: 0.12    Types: Cigarettes    Last attempt to quit: 1990    Years since quitting: 30.4  . Smokeless tobacco: Never Used  . Tobacco comment: 06/01/2018 "smoked off and on for 30 years; don't know how many years I actually smoked"  Substance Use Topics  . Alcohol use: Not Currently    Comment: 06/01/2018 "nothing in the last 10 years or more"  . Drug use: Never     Allergies   Pradaxa [dabigatran etexilate mesylate] and Penicillins   Review of Systems Review of Systems  Respiratory: Positive for shortness of breath.   All other systems reviewed and are negative.    Physical Exam Updated Vital Signs BP 112/82   Pulse 73   Temp 98 F (36.7 C) (Oral)   Resp 20   Ht 5\' 11"  (1.803 m)   Wt 108.1 kg   SpO2 98%   BMI 33.25 kg/m   Physical Exam Vitals signs and nursing note reviewed.  Constitutional:      Appearance: He is well-developed.  HENT:     Head: Normocephalic and atraumatic.  Cardiovascular:     Rate and Rhythm: Normal rate and regular rhythm.     Heart sounds: No murmur.  Pulmonary:     Effort: Pulmonary effort is normal. No respiratory distress.      Breath sounds: Normal breath sounds.  Abdominal:     Palpations: Abdomen is soft.     Tenderness: There is no abdominal tenderness. There is no guarding or rebound.  Musculoskeletal:        General: Swelling present. No tenderness.     Comments: 1+ pitting edema to BLE  Skin:    General: Skin is warm and dry.  Neurological:     Mental Status: He is alert and oriented to person, place, and time.  Psychiatric:        Mood and Affect: Mood normal.        Behavior: Behavior normal.      ED Treatments / Results  Labs (all labs ordered are listed, but only abnormal results are displayed) Labs Reviewed  TROPONIN I - Abnormal; Notable for the following components:      Result Value   Troponin I 0.08 (*)    All other components within normal limits  BRAIN NATRIURETIC PEPTIDE - Abnormal; Notable for the following components:   B Natriuretic Peptide 581.8 (*)    All other components within normal limits  CBC WITH DIFFERENTIAL/PLATELET - Abnormal; Notable for the following components:   Monocytes Absolute 1.1 (*)    All other components within normal limits  COMPREHENSIVE METABOLIC PANEL - Abnormal; Notable for the following components:   Glucose, Bld 123 (*)    BUN 24 (*)    Creatinine, Ser 1.28 (*)    Total Bilirubin 3.4 (*)    GFR calc non Af Amer 54 (*)    All other components within normal limits    EKG EKG Interpretation  Date/Time:  Wednesday Oct 19 2018 10:27:59 EDT Ventricular Rate:  75 PR Interval:    QRS Duration: 100 QT Interval:  434 QTC Calculation: 485 R Axis:   -114 Text Interpretation:  Sinus rhythm Short PR interval Probable left atrial enlargement LAD, consider left anterior fascicular block Anteroseptal infarct, old Baseline wander Confirmed by Quintella Reichert [id number]) on 10/19/2018 10:31:09 AM Also confirmed by Quintella Reichert [phone]), editor Philomena Doheny [phone])  on 10/19/2018 10:46:37 AM   Radiology Dg Chest 2 View  Result Date: 10/19/2018 CLINICAL  DATA:  Shortness of breath.  History of CHF. EXAM: CHEST - 2 VIEW COMPARISON:  06/03/2018 and 06/02/2018 FINDINGS: Persistent densities in the right lower chest compatible with small to moderate sized right pleural effusion. Increased densities in the mid right lung could represent atelectasis. In addition, there may be increased densities or consolidation in the right middle lobe based on the lateral view. Left lung is clear except for blunting at left costophrenic angle. Bridging osteophytes throughout the thoracic spine. Heart size remains upper limits of normal and stable. IMPRESSION: Small to moderate-sized right pleural effusion. Patchy densities in the mid right lung could represent atelectasis but nonspecific. Cannot exclude consolidation or airspace disease in the right middle lobe. Question trace left pleural fluid. Electronically Signed   By: Markus Daft M.D.   On: 10/19/2018 11:45    Procedures Procedures (including critical care time)  Medications Ordered in ED Medications  furosemide (LASIX) injection 40 mg (40 mg Intravenous Given 10/19/18 1224)     Initial Impression / Assessment and Plan / ED Course  I have reviewed the triage vital signs and the nursing notes.  Pertinent labs & imaging results that were available during my care of the patient were reviewed by me and considered in my medical decision making (see chart for details).        Patient with history of CHF here for evaluation of progressive shortness of breath. He is non-toxic appearing on evaluation with no respiratory distress. Chest x-ray is significant for pleural effusion, similar when compared to prior on January 3. Labs are significant for mild elevation in troponin, similar compared to prior as well as elevation and BNP. Patient without active chest pain, EKG similar when compared to priors. He is able to ambulate the department without difficulty. His wife has increased his torsemide to 60 mg daily starting  yesterday. During his ED stay he began to diuresis and he was also given additional dose of Lasix. Discussed with patient findings of acute on chronic congestive heart failure. Weight today is up 20 pounds since hospital discharge. Patient prefers not to be admitted  to the hospital today, plan to discharge home with increase torsemide dosing with close cardiology follow-up. Presentation is not consistent with pneumonia, ACS, PE, covid infection.  KODI STEIL was evaluated in Emergency Department on 10/19/2018 for the symptoms described in the history of present illness. He was evaluated in the context of the global COVID-19 pandemic, which necessitated consideration that the patient might be at risk for infection with the SARS-CoV-2 virus that causes COVID-19. Institutional protocols and algorithms that pertain to the evaluation of patients at risk for COVID-19 are in a state of rapid change based on information released by regulatory bodies including the CDC and federal and state organizations. These policies and algorithms were followed during the patient's care in the ED.   Final Clinical Impressions(s) / ED Diagnoses   Final diagnoses:  Shortness of breath  Acute on chronic congestive heart failure, unspecified heart failure type [facility])    ED Discharge Orders    None       Quintella Reichert, MD 10/19/18 1440

## 2018-10-19 NOTE — ED Notes (Signed)
Attempt at IV access unsuccessful x1, labs obtained.

## 2018-10-20 ENCOUNTER — Encounter: Payer: Self-pay | Admitting: *Deleted

## 2018-10-20 LAB — BASIC METABOLIC PANEL
BUN/Creatinine Ratio: 18 (ref 10–24)
BUN: 23 mg/dL (ref 8–27)
CO2: 22 mmol/L (ref 20–29)
Calcium: 10 mg/dL (ref 8.6–10.2)
Chloride: 102 mmol/L (ref 96–106)
Creatinine, Ser: 1.29 mg/dL — ABNORMAL HIGH (ref 0.76–1.27)
GFR calc Af Amer: 61 mL/min/{1.73_m2} (ref >59)
GFR calc non Af Amer: 53 mL/min/{1.73_m2} — ABNORMAL LOW (ref >59)
Glucose: 117 mg/dL — ABNORMAL HIGH (ref 65–99)
Potassium: 4.3 mmol/L (ref 3.5–5.2)
Sodium: 137 mmol/L (ref 134–144)

## 2018-10-20 LAB — CBC
Hematocrit: 49 % (ref 37.5–51.0)
Hemoglobin: 15.9 g/dL (ref 13.0–17.7)
MCH: 28 pg (ref 26.6–33.0)
MCHC: 32.4 g/dL (ref 31.5–35.7)
MCV: 86 fL (ref 79–97)
Platelets: 208 10*3/uL (ref 150–450)
RBC: 5.68 x10E6/uL (ref 4.14–5.80)
RDW: 12.1 % (ref 11.6–15.4)
WBC: 8.7 10*3/uL (ref 3.4–10.8)

## 2018-10-20 LAB — MAGNESIUM: Magnesium: 2.1 mg/dL (ref 1.6–2.3)

## 2018-10-25 ENCOUNTER — Encounter: Payer: Self-pay | Admitting: Cardiology

## 2018-10-25 NOTE — Telephone Encounter (Signed)
This encounter was created in error - please disregard.

## 2018-10-25 NOTE — Telephone Encounter (Signed)
Follow up:    Patient returning your call back. please call patient.

## 2018-10-25 NOTE — Telephone Encounter (Signed)
New Message          Patient is retuning Christopher Benton's call, would like a call back

## 2018-10-25 NOTE — Telephone Encounter (Signed)
LMTCB

## 2018-11-02 ENCOUNTER — Telehealth: Payer: Self-pay | Admitting: Cardiology

## 2018-11-02 NOTE — Telephone Encounter (Signed)
Spoke with pt wife, aware of dr [name] recommendations.

## 2018-11-02 NOTE — Telephone Encounter (Signed)
Wife of patient called and asked to speak with Hilda Blades.

## 2018-11-02 NOTE — Telephone Encounter (Signed)
Spoke with pt wife, patient was recently in the hospital and his wife is wondering what they need to do next. The patient currently reports he feels well. He does have some swelling in his feet but not as bad as before. He reports his stamina is not where he wants it to be and his SOB comes and goes. His weight on his scale when he left the hospital was 222lb and today he is 223 lb. He reports severe drainage and having to clear his throat a lot, he is not able to get any mucus up and out. He has an echo and follow up in July with dr Stanford Breed and prefer to see him. The wife is aware of when the patient is getting into trouble and knows to call if symptoms change or get worse. Will forward for dr Stanford Breed review per patient request.

## 2018-11-02 NOTE — Telephone Encounter (Signed)
Continue present regimen with additional 20 mg demadex for wt gain of 2-3 lbs in one day; FU as scheduled. Kirk Ruths

## 2018-12-05 ENCOUNTER — Other Ambulatory Visit (HOSPITAL_BASED_OUTPATIENT_CLINIC_OR_DEPARTMENT_OTHER): Payer: No Typology Code available for payment source

## 2018-12-05 NOTE — Progress Notes (Signed)
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 Had stress test 8/17 at [facility] that showed EF 41 and no ischemia. Pt admitted 11/17 with dyspnea and right pleural effusion. Had thoracentesis and cytology negative; diuresed. TEE 11/17 showed EF 35-40, biatrial enlargement and moderate TR. Echocardiogram October 2019 showed ejection fraction 45 to 50%; severe left ventricular hypertrophy and amyloid should be considered; moderate diastolic dysfunction; mildly dilated ascending aorta, biatrial enlargement and mild tricuspid regurgitation. CTA October 2019 showed large right pleural effusion. 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 Follow-up echocardiogram July 2020 showed ejection fraction 35 to 40%, biatrial enlargement.  Since last seen,the past week he has noticed progressive dyspnea on exertion.  No orthopnea or PND.  Mild pedal edema.  No chest pain or syncope.  Current Outpatient Medications  Medication Sig Dispense Refill  . apixaban (ELIQUIS) 5 MG TABS tablet Take 1 tablet (5 mg total) by mouth 2 (two) times daily. 60 tablet 6  . carvedilol (COREG) 3.125 MG tablet Take 1 tablet (3.125 mg total) by mouth 2 (two) times daily with a meal. 60 tablet 6  . dofetilide (TIKOSYN) 125 MCG capsule Take 1 capsule (125 mcg total) by mouth 2 (two) times daily. 60 capsule 6  . dorzolamide (TRUSOPT) 2 % ophthalmic solution Place 1 drop into both eyes 2 (two) times daily.    Marland Kitchen latanoprost (XALATAN) 0.005 % ophthalmic solution Place 1 drop into both eyes at bedtime.    . Multiple Vitamins-Minerals (MULTIVITAMIN WITH MINERALS) tablet Take 1 tablet by mouth daily.    . potassium chloride SA (K-DUR,KLOR-CON) 20 MEQ tablet Take 20 mEq by mouth 2 (two) times daily.    . rosuvastatin (CRESTOR) 20 MG tablet Take 1 tablet (20 mg total) by mouth daily. 90 tablet 3  .  Tafamidis 61 MG CAPS Take 61 mg by mouth daily.     Marland Kitchen torsemide (DEMADEX) 20 MG tablet 2 TABLETS BY MOUTH ONCE DAILY AND TAKE AN EXTRA 20 MG As needed when weight = 217 lb 06-16-18 hold for 48 hours 270 tablet 3   No current facility-administered medications for this visit.      Past Medical History:  Diagnosis Date  . Arthritis    "left foot" (06/01/2018)  . Atrial fibrillation ([place])   . CAD (coronary artery disease)   . Cardiomyopathy ([place])   . Chronic systolic CHF (congestive heart failure) ([place]) 04/12/2016  . Dyspnea   . Dysrhythmia Atrial flutter  . 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 Hypertension     Past Surgical History:  Procedure Laterality Date  . CARDIOVERSION N/A 09/04/2015   Procedure: CARDIOVERSION;  Surgeon: Skeet Latch, MD;  Location: [place];  Service: Cardiovascular;  Laterality: N/A;  . IR THORACENTESIS ASP PLEURAL SPACE W/IMG GUIDE  06/02/2018  . TEE WITHOUT CARDIOVERSION N/A 09/04/2015   Procedure: TRANSESOPHAGEAL ECHOCARDIOGRAM (TEE);  Surgeon: Skeet Latch, MD;  Location: [place];  Service: Cardiovascular;  Laterality: N/A;  . TEE WITHOUT CARDIOVERSION N/A 04/15/2016   Procedure: TRANSESOPHAGEAL ECHOCARDIOGRAM (TEE);  Surgeon: Jerline Pain, MD;  Location: [facility]COPY;  Service: Cardiovascular;  Laterality: N/A;  . TOE AMPUTATION Right    4th digit    Social History   Socioeconomic History  . Marital status: Married    Spouse name: Not on file  . Number of children: 2  .  Years of education: Not on file  . Highest education level: Not on file  Occupational History  . Not on file  Social Needs  . Financial resource strain: Not on file  . Food insecurity    Worry: Not on file    Inability: Not on file  . Transportation needs    Medical: Not on file    Non-medical: Not on file  Tobacco Use  . Smoking status: Former Smoker    Packs/day: 0.12    Types: Cigarettes    Quit date: 1990     Years since quitting: 30.5  . Smokeless tobacco: Never Used  . Tobacco comment: 06/01/2018 "smoked off and on for 30 years; don't know how many years I actually smoked"  Substance and Sexual Activity  . Alcohol use: Not Currently    Comment: 06/01/2018 "nothing in the last 10 years or more"  . Drug use: Never  . Sexual activity: Not on file  Lifestyle  . Physical activity    Days per week: Not on file    Minutes per session: Not on file  . Stress: Not on file  Relationships  . Social Herbalist on phone: Not on file    Gets together: Not on file    Attends religious service: Not on file    Active member of club or organization: Not on file    Attends meetings of clubs or organizations: Not on file    Relationship status: Not on file  . Intimate partner violence    Fear of current or ex partner: Not on file    Emotionally abused: Not on file    Physically abused: Not on file    Forced sexual activity: Not on file  Other Topics Concern  . Not on file  Social History Narrative  . Not on file    Family History  Problem Relation Age of Onset  . Heart disease Other        No family history    ROS: no fevers or chills, productive cough, hemoptysis, dysphasia, odynophagia, melena, hematochezia, dysuria, hematuria, rash, seizure activity, orthopnea, PND, pedal edema, claudication. Remaining systems are negative.  Physical Exam: Well-developed well-nourished in no acute distress.  Skin is warm and dry.  HEENT is normal.  Neck is supple.  Chest is clear to auscultation with normal expansion.  Cardiovascular exam is regular rate and rhythm.  Abdominal exam nontender or distended. No masses palpated. Extremities show no edema. neuro grossly intact  ECG-sinus rhythm with PACs and first-degree AV block, nonspecific ST changes, right axis deviation.  Personally reviewed  A/P  1 chronic combined systolic/diastolic congestive heart failure-Pt has worsening dyspnea on  exertion/volume excess.  He is short of breath in the office today.  I recommended admission for IV Lasix but he declined and does not want to go to the hospital at this point.  His wife was on the phone at the time of our discussion.  If he worsens she stated she would bring him to the hospital.  For now we will increase Demadex to 40 mg twice daily.  He is not compliant with low-sodium diet and we discussed this.  Continue fluid restriction.  We will re-schedule CHF/amyloid clinic which was canceled previously because of COVID.  We will also inquire about home IV Lasix.  We will continue with low-dose beta-blocker.  We will add ARB or Entresto if blood pressure stable and renal function stable following increased diuretic dose.  2  amyloidosis-continue tafamidis; we will arrange follow-up in amyloid clinic.  3 paroxysmal atrial fibrillation-patient remains in sinus rhythm on examination today.  Continue present dose of Tikosyn.  Continue beta-blocker and apixaban.  4 hypertension-patient's blood pressure is controlled.  5 Coronary artery disease-noted by previous CT showing coronary calcification.  Continue statin.  He is not on aspirin given need for apixaban.  6 cardiomyopathy-plan as outlined above.  7 Chronic stage III kidney disease-Check K and renal function.   Kirk Ruths, MD

## 2018-12-06 ENCOUNTER — Ambulatory Visit (HOSPITAL_BASED_OUTPATIENT_CLINIC_OR_DEPARTMENT_OTHER): Payer: No Typology Code available for payment source

## 2018-12-06 ENCOUNTER — Other Ambulatory Visit: Payer: Self-pay

## 2018-12-06 ENCOUNTER — Ambulatory Visit (HOSPITAL_BASED_OUTPATIENT_CLINIC_OR_DEPARTMENT_OTHER)
Admission: RE | Admit: 2018-12-06 | Discharge: 2018-12-06 | Disposition: A | Discharge status: Home / Self Care | Payer: No Typology Code available for payment source | Source: Ambulatory Visit | Attending: Cardiology | Admitting: Cardiology

## 2018-12-06 DIAGNOSIS — I5042 Chronic combined systolic (congestive) and diastolic (congestive) heart failure: Secondary | ICD-10-CM

## 2018-12-06 NOTE — Progress Notes (Signed)
  Echocardiogram 2D Echocardiogram has been performed.  Christopher Benton 12/06/2018, 10:19 AM

## 2018-12-07 ENCOUNTER — Encounter: Payer: Self-pay | Admitting: Cardiology

## 2018-12-07 ENCOUNTER — Telehealth: Payer: Self-pay | Admitting: Cardiology

## 2018-12-07 ENCOUNTER — Ambulatory Visit (INDEPENDENT_AMBULATORY_CARE_PROVIDER_SITE_OTHER): Payer: No Typology Code available for payment source | Admitting: Cardiology

## 2018-12-07 VITALS — BP 110/74 | HR 70 | Ht 71.0 in | Wt 232.8 lb

## 2018-12-07 DIAGNOSIS — I10 Essential (primary) hypertension: Secondary | ICD-10-CM | POA: Diagnosis not present

## 2018-12-07 DIAGNOSIS — I5042 Chronic combined systolic (congestive) and diastolic (congestive) heart failure: Secondary | ICD-10-CM

## 2018-12-07 DIAGNOSIS — E859 Amyloidosis, unspecified: Secondary | ICD-10-CM | POA: Diagnosis not present

## 2018-12-07 DIAGNOSIS — I480 Paroxysmal atrial fibrillation: Secondary | ICD-10-CM | POA: Diagnosis not present

## 2018-12-07 DIAGNOSIS — I2510 Atherosclerotic heart disease of native coronary artery without angina pectoris: Secondary | ICD-10-CM

## 2018-12-07 MED ORDER — TORSEMIDE 20 MG PO TABS: 40.0000 mg | ORAL_TABLET | Freq: Two times a day (BID) | ORAL | 3 refills | Status: DC

## 2018-12-07 NOTE — Telephone Encounter (Signed)
She called wanting to speak to news, she states she needs a callback. She also stated she had information she needs to give the office.

## 2018-12-07 NOTE — Telephone Encounter (Signed)
Pt here for appt with dr Stanford Breed

## 2018-12-07 NOTE — Patient Instructions (Addendum)
Medication Instructions:  INCREASE DEMADEX TO 40 MG TWICE DAILY= 2 OF THE 20 MG TABLETS TWICE DAILY If you need a refill on your cardiac medications before your next appointment, please call your pharmacy.   Lab work: If you have labs (blood work) drawn today and your tests are completely normal, you will receive your results only by: Marland Kitchen MyChart Message (if you have MyChart) OR . A paper copy in the mail If you have any lab test that is abnormal or we need to change your treatment, we will call you to review the results.  Follow-Up: At [facility], you and your health needs are our priority.  As part of our continuing mission to provide you with exceptional heart care, we have created designated Provider Care Teams.  These Care Teams include your primary Cardiologist (physician) and Advanced Practice Providers (APPs -  Physician Assistants and Nurse Practitioners) who all work together to provide you with the care you need, when you need it. Your physician recommends that you schedule a follow-up appointment 12-08-2018 @ 9:30 AM AT [place] AND [facility] AT [place]  Your physician recommends that you schedule a follow-up appointment in: [place]

## 2018-12-08 ENCOUNTER — Encounter (HOSPITAL_COMMUNITY): Payer: Self-pay

## 2018-12-08 ENCOUNTER — Ambulatory Visit (HOSPITAL_COMMUNITY)
Admission: RE | Admit: 2018-12-08 | Discharge: 2018-12-08 | Disposition: A | Discharge status: Home / Self Care | Payer: No Typology Code available for payment source | Source: Ambulatory Visit | Attending: Cardiology | Admitting: Cardiology

## 2018-12-08 ENCOUNTER — Other Ambulatory Visit: Payer: Self-pay

## 2018-12-08 VITALS — BP 110/72 | HR 75 | Wt 229.4 lb

## 2018-12-08 DIAGNOSIS — I5022 Chronic systolic (congestive) heart failure: Secondary | ICD-10-CM | POA: Diagnosis present

## 2018-12-08 DIAGNOSIS — E854 Organ-limited amyloidosis: Secondary | ICD-10-CM

## 2018-12-08 DIAGNOSIS — Z87891 Personal history of nicotine dependence: Secondary | ICD-10-CM | POA: Diagnosis not present

## 2018-12-08 DIAGNOSIS — Z888 Allergy status to other drugs, medicaments and biological substances status: Secondary | ICD-10-CM | POA: Insufficient documentation

## 2018-12-08 DIAGNOSIS — Z79899 Other long term (current) drug therapy: Secondary | ICD-10-CM | POA: Insufficient documentation

## 2018-12-08 DIAGNOSIS — I480 Paroxysmal atrial fibrillation: Secondary | ICD-10-CM | POA: Insufficient documentation

## 2018-12-08 DIAGNOSIS — Z7901 Long term (current) use of anticoagulants: Secondary | ICD-10-CM | POA: Insufficient documentation

## 2018-12-08 DIAGNOSIS — M1990 Unspecified osteoarthritis, unspecified site: Secondary | ICD-10-CM | POA: Diagnosis not present

## 2018-12-08 DIAGNOSIS — R238 Other skin changes: Secondary | ICD-10-CM | POA: Diagnosis not present

## 2018-12-08 DIAGNOSIS — Z880 Allergy status to penicillin: Secondary | ICD-10-CM | POA: Diagnosis not present

## 2018-12-08 DIAGNOSIS — N183 Chronic kidney disease, stage 3 (moderate): Secondary | ICD-10-CM | POA: Insufficient documentation

## 2018-12-08 DIAGNOSIS — I5023 Acute on chronic systolic (congestive) heart failure: Secondary | ICD-10-CM | POA: Diagnosis not present

## 2018-12-08 DIAGNOSIS — I2510 Atherosclerotic heart disease of native coronary artery without angina pectoris: Secondary | ICD-10-CM | POA: Diagnosis not present

## 2018-12-08 DIAGNOSIS — I130 Hypertensive heart and chronic kidney disease with heart failure and stage 1 through stage 4 chronic kidney disease, or unspecified chronic kidney disease: Secondary | ICD-10-CM | POA: Insufficient documentation

## 2018-12-08 DIAGNOSIS — E859 Amyloidosis, unspecified: Secondary | ICD-10-CM

## 2018-12-08 DIAGNOSIS — I43 Cardiomyopathy in diseases classified elsewhere: Secondary | ICD-10-CM

## 2018-12-08 LAB — BASIC METABOLIC PANEL
Anion gap: 11 (ref 5–15)
BUN: 28 mg/dL — ABNORMAL HIGH (ref 8–23)
CO2: 21 mmol/L — ABNORMAL LOW (ref 22–32)
Calcium: 9.5 mg/dL (ref 8.9–10.3)
Chloride: 104 mmol/L (ref 98–111)
Creatinine, Ser: 1.6 mg/dL — ABNORMAL HIGH (ref 0.61–1.24)
GFR calc Af Amer: 47 mL/min — ABNORMAL LOW (ref >60)
GFR calc non Af Amer: 41 mL/min — ABNORMAL LOW (ref >60)
Glucose, Bld: 106 mg/dL — ABNORMAL HIGH (ref 70–99)
Potassium: 4.1 mmol/L (ref 3.5–5.1)
Sodium: 136 mmol/L (ref 135–145)

## 2018-12-08 LAB — CBC
HCT: 50 % (ref 39.0–52.0)
Hemoglobin: 16.2 g/dL (ref 13.0–17.0)
MCH: 28.2 pg (ref 26.0–34.0)
MCHC: 32.4 g/dL (ref 30.0–36.0)
MCV: 87 fL (ref 80.0–100.0)
Platelets: 202 10*3/uL (ref 150–400)
RBC: 5.75 MIL/uL (ref 4.22–5.81)
RDW: 13.5 % (ref 11.5–15.5)
WBC: 8.4 10*3/uL (ref 4.0–10.5)
nRBC: 0 % (ref 0.0–0.2)

## 2018-12-08 LAB — BRAIN NATRIURETIC PEPTIDE: B Natriuretic Peptide: 720.6 pg/mL — ABNORMAL HIGH (ref 0.0–100.0)

## 2018-12-08 MED ORDER — FUROSEMIDE 10 MG/ML IJ SOLN
80.0000 mg | Freq: Once | INTRAMUSCULAR | Status: AC
  Administered 2018-12-08: 80 mg via INTRAVENOUS
  Filled 2018-12-08: qty 8

## 2018-12-08 MED ORDER — POTASSIUM CHLORIDE CRYS ER 20 MEQ PO TBCR
40.0000 meq | EXTENDED_RELEASE_TABLET | Freq: Once | ORAL | Status: AC
  Administered 2018-12-08: 40 meq via ORAL
  Filled 2018-12-08: qty 2

## 2018-12-08 MED ORDER — FUROSEMIDE 10 MG/ML IJ SOLN: 80.0000 mg | Freq: Once | INTRAMUSCULAR | Status: DC

## 2018-12-08 NOTE — Progress Notes (Signed)
IV placed in left AC today. Linked old IV in Left AC by accident to Furosemide. Furosemide was given through a new L AC IV 20g. Flushed with normal saline. Pt tolerated IV placement well.

## 2018-12-08 NOTE — Patient Instructions (Addendum)
Lab work was done today. Will notify you of any abnormal lab work. No news is good news.  You have been given 80mg  of IV lasix and 40 meq of Potassium today.  INCREASE Torsemide to 80mg  daily FOR TWO DAYS ONLY. The RESUME your normal 40mg  two times daily  Please follow up with the [facility] tomorrow.   At the [facility], you and your health needs are our priority. As part of our continuing mission to provide you with exceptional heart care, we have created designated Provider Care Teams. These Care Teams include your primary Cardiologist (physician) and Advanced Practice Providers (APPs- Physician Assistants and Nurse Practitioners) who all work together to provide you with the care you need, when you need it.   You may see any of the following providers on your designated Care Team at your next follow up: Marland Kitchen Dr Glori Bickers . Dr Loralie Champagne . Darrick Grinder, NP

## 2018-12-08 NOTE — Progress Notes (Signed)
ADVANCED HF CLINIC CONSULT NOTE  Referring Physician: Dr Stanford Breed Primary Care: Dr Romero Liner  Primary Cardiologist: Dr Stanford Breed   HPI: [name] is referred to the HF clinic by Dr Stanford Breed for HF consultation.   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 Referred by Dr. Stanford Breed for further management of advanced HF.  He has been struggling with HF for some time. Echo in 2017 EF 35-40%.  PYP scan 2019 Grade III suggestive of transthyretin amyloidosis. Last fall he was started on tafamidis.   He has been followed closely by Dr Stanford Breed and was seen yesterday. At that time he was concerned he may need hospital admit for ADHF however Mr. Whyte refused. He was considering home IV lasix and had him increase torsemide to 40 mg twice a day.   Over the last few weeks he has noticed increased fatigue and shortness of breath with exertion. He has been taking 40 mg torsemide daily and his weight has been 220-223 pounds. He denies presyncope/syncope. His wife has noticed he has been more short of breath and tired. No bleeding issues. He does not follow low sodium diet. Taking all medications. Says he does not pee much when he takes torsemide 40mg .   ECHO 12/07/2018 -EF 35-40% RV normal, LA dilated, RA severely dilated.  ECHO 03/10/2018 EF 45-50% with  suspected amyloidosis.  Grade II DD, Severe hypertrophy ECHO 2017  EF 35-40% RA/LA dilated, Ef 35-40%  Moderate hypertrophy    Stress Test at [facility] EF 41% no ischemia  Review of Systems: [y] = yes, [ ]  = no   General: Weight gain [Y ]; Weight loss [ ] ; Anorexia [ ] ; Fatigue [Y ]; Fever [ ] ; Chills [ ] ; Weakness [Y ]  Cardiac: Chest pain/pressure [ ] ; Resting SOB [ ] ; Exertional SOB [Y ]; Orthopnea [Y ]; Pedal Edema [Y ]; Palpitations [ ] ; Syncope [ ] ; Presyncope [ ] ; Paroxysmal nocturnal dyspnea[ ]   Pulmonary: Cough [ ] ; Wheezing[ ] ; Hemoptysis[ ] ;  Sputum [ ] ; Snoring [ ]   GI: Vomiting[ ] ; Dysphagia[ ] ; Melena[ ] ; Hematochezia [ ] ; Heartburn[ ] ; Abdominal pain [ ] ; Constipation [ ] ; Diarrhea [ ] ; BRBPR [ ]   GU: Hematuria[ ] ; Dysuria [ ] ; Nocturia[ ]   Vascular: Pain in legs with walking [ ] ; Pain in feet with lying flat [ ] ; Non-healing sores [ ] ; Stroke [ ] ; TIA [ ] ; Slurred speech [ ] ;  Neuro: Headaches[ ] ; Vertigo[ ] ; Seizures[ ] ; Paresthesias[ ] ;Blurred vision [ ] ; Diplopia [ ] ; Vision changes [ ]   Ortho/Skin: Arthritis [ ] ; Joint pain [Y ]; Muscle pain [ ] ; Joint swelling [ ] ; Back Pain [Y ]; Rash [ ]   Psych: Depression[ ] ; Anxiety[ ]   Heme: Bleeding problems [ ] ; Clotting disorders [ ] ; Anemia [ ]   Endocrine: Diabetes [ ] ; Thyroid dysfunction[ ]    Past Medical History:  Diagnosis Date   Arthritis    "left foot" (06/01/2018)   Atrial fibrillation (H[facility])    CAD (coronary artery disease)    Cardiomyopathy ([facility])    Chronic systolic CHF (congestive heart failure) ([place]) 04/12/2016   Dyspnea    Dysrhythmia Atrial flutter   Hepatitis 1960   "? kind; went to hospital; got alot of shots" (06/01/2018)   Hypertension     Current Outpatient Medications  Medication Sig Dispense Refill   apixaban (ELIQUIS) 5 MG TABS tablet Take 1 tablet (5 mg  total) by mouth 2 (two) times daily. 60 tablet 6   carvedilol (COREG) 3.125 MG tablet Take 1 tablet (3.125 mg total) by mouth 2 (two) times daily with a meal. 60 tablet 6   dofetilide (TIKOSYN) 125 MCG capsule Take 1 capsule (125 mcg total) by mouth 2 (two) times daily. 60 capsule 6   dorzolamide (TRUSOPT) 2 % ophthalmic solution Place 1 drop into both eyes 2 (two) times daily.     latanoprost (XALATAN) 0.005 % ophthalmic solution Place 1 drop into both eyes at bedtime.     Multiple Vitamins-Minerals (MULTIVITAMIN WITH MINERALS) tablet Take 1 tablet by mouth daily.     potassium chloride SA (K-DUR,KLOR-CON) 20 MEQ tablet Take 20 mEq by mouth 2 (two) times daily.     rosuvastatin  (CRESTOR) 20 MG tablet Take 1 tablet (20 mg total) by mouth daily. 90 tablet 3   Tafamidis 61 MG CAPS Take 61 mg by mouth daily.      torsemide (DEMADEX) 20 MG tablet Take 2 tablets (40 mg total) by mouth 2 (two) times daily. TAKE AN EXTRA 20 MG As needed when weight = 217 lb 06-16-18 hold for 48 hours 360 tablet 3   No current facility-administered medications for this encounter.     Allergies  Allergen Reactions   Pradaxa [Dabigatran Etexilate Mesylate] Nausea Only   Penicillins Rash      Social History   Socioeconomic History   Marital status: Married    Spouse name: Not on file   Number of children: 2   Years of education: Not on file   Highest education level: Not on file  Occupational History   Not on file  Social Needs   Financial resource strain: Not on file   Food insecurity    Worry: Not on file    Inability: Not on file   Transportation needs    Medical: Not on file    Non-medical: Not on file  Tobacco Use   Smoking status: Former Smoker    Packs/day: 0.12    Types: Cigarettes    Quit date: 1990    Years since quitting: 30.5   Smokeless tobacco: Never Used   Tobacco comment: 06/01/2018 "smoked off and on for 30 years; don't know how many years I actually smoked"  Substance and Sexual Activity   Alcohol use: Not Currently    Comment: 06/01/2018 "nothing in the last 10 years or more"   Drug use: Never   Sexual activity: Not on file  Lifestyle   Physical activity    Days per week: Not on file    Minutes per session: Not on file   Stress: Not on file  Relationships   Social connections    Talks on phone: Not on file    Gets together: Not on file    Attends religious service: Not on file    Active member of club or organization: Not on file    Attends meetings of clubs or organizations: Not on file    Relationship status: Not on file   Intimate partner violence    Fear of current or ex partner: Not on file    Emotionally abused: Not  on file    Physically abused: Not on file    Forced sexual activity: Not on file  Other Topics Concern   Not on file  Social History Narrative   Not on file      Family History  Problem Relation Age of Onset   Heart  disease Other        No family history     Vitals:   12/08/18 0956  BP: 110/72  Pulse: 75  SpO2: 95%  Weight: 104.1 kg (229 lb 6.4 oz)   Wt Readings from Last 3 Encounters:  12/08/18 104.1 kg (229 lb 6.4 oz)  12/07/18 105.6 kg (232 lb 12.8 oz)  10/19/18 108.1 kg (238 lb 6.4 oz)    PHYSICAL EXAM: General:  Arrived in a wheel chair. No respiratory difficulty HEENT: normal Neck: supple. ~11-12. Carotids 2+ bilat; no bruits. No lymphadenopathy or thryomegaly appreciated. Cor: PMI nondisplaced. Regular rate & rhythm. No rubs, gallops or murmurs. Lungs: clear decreased LLL Abdomen: soft, nontender, nondistended. No hepatosplenomegaly. No bruits or masses. Good bowel sounds. Extremities: no cyanosis, clubbing, rash, R and LLE 1+ edema. L great toe bulla. L and R pedal pulse. Neuro: alert & oriented x 3, cranial nerves grossly intact. moves all 4 extremities w/o difficulty. Affect pleasant.   ASSESSMENT & PLAN:  1. A/C Systolic HF  ECHO 10/02/2990-EQ 35-40% RV normal, LA dilated, RA severely dilated. NYHA III with functional decline. Discussed hospital admit today with [place] however he declined. ? Low output HF.   Volume overloaded today but not markedly overloaded. Given 80 mg IV lasix + 40 meq K in the clinic > 700 cc urine output.  Increase home torsemide to 80 mg once daily.  - For now continue low dose carvedilol.  - He has been on spiro in the past but later stopped due to CKD.  - Check BMET and BNP today.   2. 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  -Check myeloma panel.  - Check [facility] genetic test today.   3. CKD Stage III Creatinine baseline 1.2-1.4  Check BMET today.    4. L great  toe Bulla noted.   5. PAF  I reviewed EKG from yesterday. In NSR at that time.  On tikosyn  Continue eliquis 5 mg twice a day.     Follow up tomorrow to reassess volume status. He was offered hospital admit however he declined. Dr Haroldine Laws had a lengthy discussion about his prognosis.   Darrick Grinder NP-C  4:38 PM  Patient seen and examined with the above-signed Advanced Practice Provider and/or Housestaff. I personally reviewed laboratory data, imaging studies and relevant notes. I independently examined the patient and formulated the important aspects of the plan. I have edited the note to reflect any of my changes or salient points. I have personally discussed the plan with the patient and/or family.  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 I have reviewed echo and PYP scan and PYP is very strongly positive which is c/w TTR (although some AL amyloid can also have positive PYP). He has had a myeloma panel ordered in 2019 but this has not been completed. He has also not had genetic testing. I spent nearly an hour talking to him and his wife about the various types of cardiac amyloidosis and how progressive they can be.   On exam he has mild to moderate fluid overload with JVP to the jaw He is regular with diminished lung sounds at the bases and has 1-2+ LE edema.   ReDS reading is 44%  We gave him a dose of IV lasix in the clinic and we will increase torsemide to 80 daily and resend his myeloma panel and also send off saliva  for genetic testing for TTR amyloid and possible Val122i mutation.   We will see him back tomorrow and reassess. If no progress he will need to be admitted for further diuresis.   Total time spent 55 minutes. Over half that time spent discussing above.   Glori Bickers, MD  8:32 PM

## 2018-12-09 ENCOUNTER — Encounter (HOSPITAL_COMMUNITY): Payer: Self-pay

## 2018-12-09 ENCOUNTER — Ambulatory Visit (HOSPITAL_COMMUNITY)
Admission: RE | Admit: 2018-12-09 | Discharge: 2018-12-09 | Disposition: A | Discharge status: Home / Self Care | Payer: No Typology Code available for payment source | Source: Ambulatory Visit | Attending: Cardiology | Admitting: Cardiology

## 2018-12-09 VITALS — BP 123/69 | HR 72 | Wt 227.0 lb

## 2018-12-09 DIAGNOSIS — Z880 Allergy status to penicillin: Secondary | ICD-10-CM | POA: Insufficient documentation

## 2018-12-09 DIAGNOSIS — R238 Other skin changes: Secondary | ICD-10-CM

## 2018-12-09 DIAGNOSIS — I2510 Atherosclerotic heart disease of native coronary artery without angina pectoris: Secondary | ICD-10-CM | POA: Diagnosis not present

## 2018-12-09 DIAGNOSIS — I5022 Chronic systolic (congestive) heart failure: Secondary | ICD-10-CM

## 2018-12-09 DIAGNOSIS — Z888 Allergy status to other drugs, medicaments and biological substances status: Secondary | ICD-10-CM | POA: Diagnosis not present

## 2018-12-09 DIAGNOSIS — E859 Amyloidosis, unspecified: Secondary | ICD-10-CM

## 2018-12-09 DIAGNOSIS — Z7901 Long term (current) use of anticoagulants: Secondary | ICD-10-CM | POA: Diagnosis not present

## 2018-12-09 DIAGNOSIS — I429 Cardiomyopathy, unspecified: Secondary | ICD-10-CM | POA: Insufficient documentation

## 2018-12-09 DIAGNOSIS — N183 Chronic kidney disease, stage 3 (moderate): Secondary | ICD-10-CM | POA: Insufficient documentation

## 2018-12-09 DIAGNOSIS — L03031 Cellulitis of right toe: Secondary | ICD-10-CM

## 2018-12-09 DIAGNOSIS — Z79899 Other long term (current) drug therapy: Secondary | ICD-10-CM | POA: Diagnosis not present

## 2018-12-09 DIAGNOSIS — M1990 Unspecified osteoarthritis, unspecified site: Secondary | ICD-10-CM | POA: Insufficient documentation

## 2018-12-09 DIAGNOSIS — Z87891 Personal history of nicotine dependence: Secondary | ICD-10-CM | POA: Insufficient documentation

## 2018-12-09 DIAGNOSIS — I480 Paroxysmal atrial fibrillation: Secondary | ICD-10-CM | POA: Diagnosis not present

## 2018-12-09 DIAGNOSIS — I130 Hypertensive heart and chronic kidney disease with heart failure and stage 1 through stage 4 chronic kidney disease, or unspecified chronic kidney disease: Secondary | ICD-10-CM | POA: Insufficient documentation

## 2018-12-09 MED ORDER — DOXYCYCLINE HYCLATE 50 MG PO CAPS: 100.0000 mg | ORAL_CAPSULE | Freq: Two times a day (BID) | ORAL | 0 refills | Status: AC

## 2018-12-09 NOTE — Progress Notes (Signed)
ReDS Vest - 12/09/18 0959      ReDS Vest   Estimated volume prior to reading  Med    Fitting Posture  Sitting    Height Marker  Tall    Ruler Value  32    Center Strip  Aligned    ReDS Value  42    Anatomical Comments  station D

## 2018-12-09 NOTE — Patient Instructions (Signed)
Labs were done today. We will call you with any ABNORMAL results. No news is good news!  Please begin taking your antibiotic.  We will see you next week for you next appointment! :)

## 2018-12-09 NOTE — Progress Notes (Signed)
ADVANCED HF CLINIC NOTE  Referring Physician: Dr Stanford Breed Primary Care: Dr Romero Liner  Primary Cardiologist: Dr Stanford Breed   HPI: Christopher Benton is referred to the HF clinic by Dr Stanford Breed for HF consultation.   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 Referred by Dr. Stanford Breed for further management of advanced HF.  He has been struggling with HF for some time. Echo in 2017 EF 35-40%.  PYP scan 2019 Grade III suggestive of transthyretin amyloidosis. Last fall he was started on tafamidis.   He has been followed closely by Dr Stanford Breed and was seen yesterday. At that time he was concerned he may need hospital admit for ADHF however Christopher. Benton refused. He was considering home IV lasix and had him increase torsemide to 40 mg twice a day.   Today he returns for follow up with his wife. He was seen in the clinic yesterday and given 80 mg IV lasix. Overall feeling much better. Mild SOB with exertion. Denies PND/Orthopnea. Appetite ok. No fever or chills. He did not weigh today.  Taking all medications  ECHO 12/07/2018 -EF 35-40% RV normal, LA dilated, RA severely dilated.  ECHO 03/10/2018 EF 45-50% with  suspected amyloidosis.  Grade II DD, Severe hypertrophy ECHO 2017  EF 35-40% RA/LA dilated, Ef 35-40%  Moderate hypertrophy    Stress Test at [facility] EF 41% no ischemia    Past Medical History:  Diagnosis Date  . Arthritis    "left foot" (06/01/2018)  . Atrial fibrillation ([place])   . CAD (coronary artery disease)   . Cardiomyopathy ([place])   . Chronic systolic CHF (congestive heart failure) ([place]) 04/12/2016  . Dyspnea   . Dysrhythmia Atrial flutter  . 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 Hypertension     Current Outpatient Medications  Medication Sig Dispense Refill  . apixaban (ELIQUIS) 5 MG TABS tablet Take 1 tablet (5 mg total) by mouth 2 (two) times  daily. 60 tablet 6  . carvedilol (COREG) 3.125 MG tablet Take 1 tablet (3.125 mg total) by mouth 2 (two) times daily with a meal. 60 tablet 6  . dofetilide (TIKOSYN) 125 MCG capsule Take 1 capsule (125 mcg total) by mouth 2 (two) times daily. 60 capsule 6  . dorzolamide (TRUSOPT) 2 % ophthalmic solution Place 1 drop into both eyes 2 (two) times daily.    Marland Kitchen latanoprost (XALATAN) 0.005 % ophthalmic solution Place 1 drop into both eyes at bedtime.    . Multiple Vitamins-Minerals (MULTIVITAMIN WITH MINERALS) tablet Take 1 tablet by mouth daily.    . potassium chloride SA (K-DUR,KLOR-CON) 20 MEQ tablet Take 20 mEq by mouth 2 (two) times daily.    . rosuvastatin (CRESTOR) 20 MG tablet Take 1 tablet (20 mg total) by mouth daily. 90 tablet 3  . Tafamidis 61 MG CAPS Take 61 mg by mouth daily.     Marland Kitchen torsemide (DEMADEX) 20 MG tablet Take 2 tablets (40 mg total) by mouth 2 (two) times daily. TAKE AN EXTRA 20 MG As needed when weight = 217 lb 06-16-18 hold for 48 hours 360 tablet 3   No current facility-administered medications for this encounter.     Allergies  Allergen Reactions  . Pradaxa [Dabigatran Etexilate Mesylate] Nausea Only  . Penicillins Rash      Social History   Socioeconomic History  . Marital status:  Married    Spouse name: Not on file  . Number of children: 2  . Years of education: Not on file  . Highest education level: Not on file  Occupational History  . Not on file  Social Needs  . Financial resource strain: Not on file  . Food insecurity    Worry: Not on file    Inability: Not on file  . Transportation needs    Medical: Not on file    Non-medical: Not on file  Tobacco Use  . Smoking status: Former Smoker    Packs/day: 0.12    Types: Cigarettes    Quit date: 1990    Years since quitting: 30.5  . Smokeless tobacco: Never Used  . Tobacco comment: 06/01/2018 "smoked off and on for 30 years; don't know how many years I actually smoked"  Substance and Sexual Activity   . Alcohol use: Not Currently    Comment: 06/01/2018 "nothing in the last 10 years or more"  . Drug use: Never  . Sexual activity: Not on file  Lifestyle  . Physical activity    Days per week: Not on file    Minutes per session: Not on file  . Stress: Not on file  Relationships  . Social Herbalist on phone: Not on file    Gets together: Not on file    Attends religious service: Not on file    Active member of club or organization: Not on file    Attends meetings of clubs or organizations: Not on file    Relationship status: Not on file  . Intimate partner violence    Fear of current or ex partner: Not on file    Emotionally abused: Not on file    Physically abused: Not on file    Forced sexual activity: Not on file  Other Topics Concern  . Not on file  Social History Narrative  . Not on file      Family History  Problem Relation Age of Onset  . Heart disease Other        No family history   ReDS Vest - 12/09/18 0959      ReDS Vest   Estimated volume prior to reading  Med    Fitting Posture  Sitting    Height Marker  Tall    Ruler Value  32    [place]  Aligned    ReDS Value  42    Anatomical Comments  station D      ReDS Vest - 12/09/18 0959      ReDS Vest   Estimated volume prior to reading  Med    Fitting Posture  Sitting    Height Marker  Tall    Ruler Value  32    [place]  Aligned    ReDS Value  42    Anatomical Comments  station D      Vitals:   12/09/18 0945  BP: 123/69  Pulse: 72  SpO2: 94%  Weight: 103 kg (227 lb)   Wt Readings from Last 3 Encounters:  12/09/18 103 kg (227 lb)  12/08/18 104.1 kg (229 lb 6.4 oz)  12/07/18 105.6 kg (232 lb 12.8 oz)    PHYSICAL EXAM: General:  Well appearing. No resp difficulty HEENT: normal Neck: supple. JVP 7-8 . Carotids 2+ bilat; no bruits. No lymphadenopathy or thryomegaly appreciated. Cor: PMI nondisplaced. Regular rate & rhythm. No rubs, gallops or murmurs. Lungs: clear  Abdomen: soft, nontender,  nondistended. No hepatosplenomegaly. No bruits or masses. Good bowel sounds. Extremities: no cyanosis, clubbing, rash, R and LLE trace edema. R great toe bulla Neuro: alert & orientedx3, cranial nerves grossly intact. moves all 4 extremities w/o difficulty. Affect pleasant   ASSESSMENT & PLAN:  1. Chronic Systolic HF  ECHO 4/0/3474-QV 35-40% RV normal, LA dilated, RA severely dilated. Reds Clip trending down 44>42%.  NYHA III. Volume status improving.  Continue torsemide 80 mg daily for 2 days then back to 40 mg twice a day.   - For now continue low dose carvedilol.  - He has been on spiro in the past but later stopped due to CKD.  -Check BMET today.   2. 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  Myeloma panel pending. Christopher Benton genetic test obtained 7/9.   3. CKD Stage III Creatinine baseline 1.2-1.4  Check BMET   4. R great toe and possible cellulitis I drained bulla with old blood noted. Covered with dry gauze. Place on doxycyline 100 mg twice a day x 7 days.  He was instructed to call PCP if he develops fever or redness.    5. PAF  Regular on exam.  On tikosyn  Continue eliquis 5 mg twice a day.   Follow up on 12/19/18 with Dr Haroldine Laws.    Lucindy Borel NP-C  10:06 AM

## 2018-12-12 LAB — MULTIPLE MYELOMA PANEL, SERUM
Albumin SerPl Elph-Mcnc: 4 g/dL (ref 2.9–4.4)
Albumin/Glob SerPl: 1.3 (ref 0.7–1.7)
Alpha 1: 0.2 g/dL (ref 0.0–0.4)
Alpha2 Glob SerPl Elph-Mcnc: 0.7 g/dL (ref 0.4–1.0)
B-Globulin SerPl Elph-Mcnc: 1 g/dL (ref 0.7–1.3)
Gamma Glob SerPl Elph-Mcnc: 1.3 g/dL (ref 0.4–1.8)
Globulin, Total: 3.2 g/dL (ref 2.2–3.9)
IgA: 283 mg/dL (ref 61–437)
IgG (Immunoglobin G), Serum: 1404 mg/dL (ref 603–1613)
IgM (Immunoglobulin M), Srm: 74 mg/dL (ref 15–143)
Total Protein ELP: 7.2 g/dL (ref 6.0–8.5)

## 2018-12-19 ENCOUNTER — Ambulatory Visit (HOSPITAL_COMMUNITY)
Admission: RE | Admit: 2018-12-19 | Discharge: 2018-12-19 | Disposition: A | Discharge status: Home / Self Care | Payer: No Typology Code available for payment source | Source: Ambulatory Visit | Attending: Internal Medicine | Admitting: Internal Medicine

## 2018-12-19 ENCOUNTER — Encounter (HOSPITAL_COMMUNITY): Payer: Self-pay | Admitting: Internal Medicine

## 2018-12-19 ENCOUNTER — Other Ambulatory Visit: Payer: Self-pay

## 2018-12-19 VITALS — BP 113/60 | HR 78 | Wt 230.2 lb

## 2018-12-19 DIAGNOSIS — R001 Bradycardia, unspecified: Secondary | ICD-10-CM | POA: Insufficient documentation

## 2018-12-19 DIAGNOSIS — Z7901 Long term (current) use of anticoagulants: Secondary | ICD-10-CM | POA: Diagnosis not present

## 2018-12-19 DIAGNOSIS — Z79899 Other long term (current) drug therapy: Secondary | ICD-10-CM | POA: Diagnosis not present

## 2018-12-19 DIAGNOSIS — E854 Organ-limited amyloidosis: Secondary | ICD-10-CM | POA: Diagnosis not present

## 2018-12-19 DIAGNOSIS — N183 Chronic kidney disease, stage 3 (moderate): Secondary | ICD-10-CM | POA: Insufficient documentation

## 2018-12-19 DIAGNOSIS — Z8249 Family history of ischemic heart disease and other diseases of the circulatory system: Secondary | ICD-10-CM | POA: Diagnosis not present

## 2018-12-19 DIAGNOSIS — S90424A Blister (nonthermal), right lesser toe(s), initial encounter: Secondary | ICD-10-CM | POA: Diagnosis not present

## 2018-12-19 DIAGNOSIS — I429 Cardiomyopathy, unspecified: Secondary | ICD-10-CM | POA: Insufficient documentation

## 2018-12-19 DIAGNOSIS — I130 Hypertensive heart and chronic kidney disease with heart failure and stage 1 through stage 4 chronic kidney disease, or unspecified chronic kidney disease: Secondary | ICD-10-CM | POA: Insufficient documentation

## 2018-12-19 DIAGNOSIS — Z87891 Personal history of nicotine dependence: Secondary | ICD-10-CM | POA: Insufficient documentation

## 2018-12-19 DIAGNOSIS — I43 Cardiomyopathy in diseases classified elsewhere: Secondary | ICD-10-CM | POA: Insufficient documentation

## 2018-12-19 DIAGNOSIS — R9431 Abnormal electrocardiogram [ECG] [EKG]: Secondary | ICD-10-CM | POA: Diagnosis not present

## 2018-12-19 DIAGNOSIS — I5022 Chronic systolic (congestive) heart failure: Secondary | ICD-10-CM | POA: Insufficient documentation

## 2018-12-19 DIAGNOSIS — Z888 Allergy status to other drugs, medicaments and biological substances status: Secondary | ICD-10-CM | POA: Diagnosis not present

## 2018-12-19 DIAGNOSIS — I2510 Atherosclerotic heart disease of native coronary artery without angina pectoris: Secondary | ICD-10-CM | POA: Insufficient documentation

## 2018-12-19 DIAGNOSIS — I480 Paroxysmal atrial fibrillation: Secondary | ICD-10-CM | POA: Insufficient documentation

## 2018-12-19 DIAGNOSIS — Z880 Allergy status to penicillin: Secondary | ICD-10-CM | POA: Diagnosis not present

## 2018-12-19 LAB — CBC
HCT: 50.2 % (ref 39.0–52.0)
Hemoglobin: 15.6 g/dL (ref 13.0–17.0)
MCH: 27.5 pg (ref 26.0–34.0)
MCHC: 31.1 g/dL (ref 30.0–36.0)
MCV: 88.4 fL (ref 80.0–100.0)
Platelets: 227 10*3/uL (ref 150–400)
RBC: 5.68 MIL/uL (ref 4.22–5.81)
RDW: 13.9 % (ref 11.5–15.5)
WBC: 9.5 10*3/uL (ref 4.0–10.5)
nRBC: 0 % (ref 0.0–0.2)

## 2018-12-19 LAB — BASIC METABOLIC PANEL
Anion gap: 10 (ref 5–15)
BUN: 34 mg/dL — ABNORMAL HIGH (ref 8–23)
CO2: 21 mmol/L — ABNORMAL LOW (ref 22–32)
Calcium: 9.5 mg/dL (ref 8.9–10.3)
Chloride: 106 mmol/L (ref 98–111)
Creatinine, Ser: 1.5 mg/dL — ABNORMAL HIGH (ref 0.61–1.24)
GFR calc Af Amer: 51 mL/min — ABNORMAL LOW (ref >60)
GFR calc non Af Amer: 44 mL/min — ABNORMAL LOW (ref >60)
Glucose, Bld: 116 mg/dL — ABNORMAL HIGH (ref 70–99)
Potassium: 4.4 mmol/L (ref 3.5–5.1)
Sodium: 137 mmol/L (ref 135–145)

## 2018-12-19 LAB — BRAIN NATRIURETIC PEPTIDE: B Natriuretic Peptide: 811.3 pg/mL — ABNORMAL HIGH (ref 0.0–100.0)

## 2018-12-19 LAB — SEDIMENTATION RATE: Sed Rate: 1 mm/hr (ref 0–16)

## 2018-12-19 NOTE — Patient Instructions (Addendum)
Lab work done today. We will notify you of any abnormal lab work. No news is good news!  EKG done today.  ReDS Vest reading was 39%  You have been referred to Dr. Lattie Corns for further Genetic Evaluation.  Please follow up with the [facility] in 3-4 weeks.  At the [facility], you and your health needs are our priority. As part of our continuing mission to provide you with exceptional heart care, we have created designated Provider Care Teams. These Care Teams include your primary Cardiologist (physician) and Advanced Practice Providers (APPs- Physician Assistants and Nurse Practitioners) who all work together to provide you with the care you need, when you need it.   You may see any of the following providers on your designated Care Team at your next follow up: Marland Kitchen Dr Glori Bickers . Dr Loralie Champagne . Darrick Grinder, NP

## 2018-12-19 NOTE — Progress Notes (Signed)
ADVANCED HF CLINIC NOTE  Referring Physician: Dr Stanford Breed Primary Care: Dr Romero Liner  Primary Cardiologist: Dr Stanford Breed   HPI: Christopher Benton is referred to the HF clinic by Dr Stanford Breed for HF consultation.   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 Referred by Dr. Stanford Breed for further management of advanced HF.  He has been struggling with HF for some time. Echo in 2017 EF 35-40%.  PYP scan 2019 Grade III suggestive of transthyretin amyloidosis. Last fall he was started on tafamidis.   He has been followed closely by Dr Stanford Breed.  We saw him 2 weeks and double torsemide for several days and also lanced a boil on his R big toe.   Today he returns for follow up with his wife. Says his breathing isn't too good and it is made worse with the heat an wearing a mask. Weight stable at 219-220. Now taking torsemide 40 bid. Still with some LE edema. No orthopnea or PND. Last creatinine on 7/9 was 1.6 (baseline 1.3-1.4). Gets fatigued just with ADLs.   ECHO 12/07/2018 -EF 35-40% RV normal, LA dilated, RA severely dilated.  ECHO 03/10/2018 EF 45-50% with  suspected amyloidosis.  Grade II DD, Severe hypertrophy ECHO 2017  EF 35-40% RA/LA dilated, Ef 35-40%  Moderate hypertrophy    Stress Test at [facility] EF 41% no ischemia    Past Medical History:  Diagnosis Date  . Arthritis    "left foot" (06/01/2018)  . Atrial fibrillation ([place])   . CAD (coronary artery disease)   . Cardiomyopathy ([place])   . Chronic systolic CHF (congestive heart failure) ([place]) 04/12/2016  . Dyspnea   . Dysrhythmia Atrial flutter  . 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 Hypertension     Current Outpatient Medications  Medication Sig Dispense Refill  . apixaban (ELIQUIS) 5 MG TABS tablet Take 1 tablet (5 mg total) by mouth 2 (two) times daily. 60 tablet 6  . carvedilol (COREG) 3.125 MG  tablet Take 1 tablet (3.125 mg total) by mouth 2 (two) times daily with a meal. 60 tablet 6  . dofetilide (TIKOSYN) 125 MCG capsule Take 1 capsule (125 mcg total) by mouth 2 (two) times daily. 60 capsule 6  . dorzolamide (TRUSOPT) 2 % ophthalmic solution Place 1 drop into both eyes 2 (two) times daily.    Marland Kitchen latanoprost (XALATAN) 0.005 % ophthalmic solution Place 1 drop into both eyes at bedtime.    . Multiple Vitamins-Minerals (MULTIVITAMIN WITH MINERALS) tablet Take 1 tablet by mouth daily.    . potassium chloride SA (K-DUR,KLOR-CON) 20 MEQ tablet Take 20 mEq by mouth 2 (two) times daily.    . Tafamidis 61 MG CAPS Take 61 mg by mouth daily.     Marland Kitchen torsemide (DEMADEX) 20 MG tablet Take 2 tablets (40 mg total) by mouth 2 (two) times daily. TAKE AN EXTRA 20 MG As needed when weight = 217 lb 06-16-18 hold for 48 hours 360 tablet 3  . rosuvastatin (CRESTOR) 20 MG tablet Take 1 tablet (20 mg total) by mouth daily. 90 tablet 3   No current facility-administered medications for this encounter.     Allergies  Allergen Reactions  . Pradaxa [Dabigatran Etexilate Mesylate] Nausea Only  . Penicillins Rash      Social History   Socioeconomic History  . Marital status: Married  Spouse name: Not on file  . Number of children: 2  . Years of education: Not on file  . Highest education level: Not on file  Occupational History  . Not on file  Social Needs  . Financial resource strain: Not on file  . Food insecurity    Worry: Not on file    Inability: Not on file  . Transportation needs    Medical: Not on file    Non-medical: Not on file  Tobacco Use  . Smoking status: Former Smoker    Packs/day: 0.12    Types: Cigarettes    Quit date: 1990    Years since quitting: 30.5  . Smokeless tobacco: Never Used  . Tobacco comment: 06/01/2018 "smoked off and on for 30 years; don't know how many years I actually smoked"  Substance and Sexual Activity  . Alcohol use: Not Currently    Comment: 06/01/2018  "nothing in the last 10 years or more"  . Drug use: Never  . Sexual activity: Not on file  Lifestyle  . Physical activity    Days per week: Not on file    Minutes per session: Not on file  . Stress: Not on file  Relationships  . Social Herbalist on phone: Not on file    Gets together: Not on file    Attends religious service: Not on file    Active member of club or organization: Not on file    Attends meetings of clubs or organizations: Not on file    Relationship status: Not on file  . Intimate partner violence    Fear of current or ex partner: Not on file    Emotionally abused: Not on file    Physically abused: Not on file    Forced sexual activity: Not on file  Other Topics Concern  . Not on file  Social History Narrative  . Not on file      Family History  Problem Relation Age of Onset  . Heart disease Other        No family history     Vitals:   12/19/18 1426  BP: 113/60  Pulse: 78  SpO2: 94%  Weight: 104.4 kg (230 lb 3.2 oz)   Wt Readings from Last 3 Encounters:  12/19/18 104.4 kg (230 lb 3.2 oz)  12/09/18 103 kg (227 lb)  12/08/18 104.1 kg (229 lb 6.4 oz)    PHYSICAL EXAM: General:  Well appearing. No resp difficulty HEENT: normal Neck: supple. JVP to jaw. Prominent v waves. Carotids 2+ bilat; no bruits. No lymphadenopathy or thryomegaly appreciated. Cor: PMI nondisplaced. Irregular rate & rhythm. No rubs, gallops or murmurs. Lungs: clear Abdomen: soft, nontender, nondistended. No hepatosplenomegaly. No bruits or masses. Good bowel sounds. Extremities: no cyanosis, clubbing, rash, 2+ edema on R tr-1+ on left.  Eschar on R great toe. With fluid filled blister on ball of foot  Neuro: alert & orientedx3, cranial nerves grossly intact. moves all 4 extremities w/o difficulty. Affect pleasant  ECG: Sinus brady at 58 with PACs and 1AVB QTC 465 Personally reviewed   ASSESSMENT & PLAN:  1. Chronic Systolic HF due to TTR cardiac amyloidois -ECHO  12/07/2018-EF 35-40% RV normal, LA dilated, RA severely dilated. - Reds Clip trending down 44>42%>39%  - Persistent NYHA III. Volume status remains elevated but ReDS coming down slowly. Will continue torsemide 40 bid. Adjust as needed based on renal functions - For now continue low dose carvedilol.  - He has  been on spiro in the past but later stopped due to CKD.  - Check BMET today.   2. Cardiac Amyloidosis - Familial TTR - PYP Grade III strongly suggestive of TTR- Started on Tafamidis in 2019.  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 Broadus John for genetic counseling (He has 2 kids and 7 siblings) - We discussed possible gene silencer study at 38 with Dr. Lora Paula and he will let me know   3. CKD Stage III -Creatinine baseline 1.3-1.4  - Most recent creatinine 1.6 on 12/08/18  - Check BMET today  4. R great toe eschar and blister - has f/u at [facility] on Thursday to see Podiatrist  5. PAF  - Irregular on exam but ECG with NSR with PACs - On tikosyn & Eliquis. QTc 49ms - Watch renal function closely and adjust as needed  Total time spent 45 minutes. Over half that time spent discussing above.    Glori Bickers MD  2:54 PM

## 2018-12-19 NOTE — Progress Notes (Signed)
ReDS Vest - 12/19/18 1530      ReDS Vest   Estimated volume prior to reading  Med    Fitting Posture  Sitting    Height Marker  Tall    Ruler Value  [place]  Aligned    ReDS Value  39    Anatomical Comments  Station D

## 2019-01-06 ENCOUNTER — Telehealth: Payer: Self-pay | Admitting: Cardiology

## 2019-01-06 NOTE — Telephone Encounter (Signed)
New Message     Sherell from the [place] would like the office notes from the July appt faxed   Please fax  [phone]

## 2019-01-16 ENCOUNTER — Encounter (HOSPITAL_COMMUNITY): Payer: No Typology Code available for payment source | Admitting: Internal Medicine

## 2019-01-16 ENCOUNTER — Encounter (HOSPITAL_BASED_OUTPATIENT_CLINIC_OR_DEPARTMENT_OTHER): Payer: No Typology Code available for payment source | Attending: Internal Medicine

## 2019-01-16 DIAGNOSIS — I110 Hypertensive heart disease with heart failure: Secondary | ICD-10-CM | POA: Diagnosis not present

## 2019-01-16 DIAGNOSIS — I480 Paroxysmal atrial fibrillation: Secondary | ICD-10-CM | POA: Insufficient documentation

## 2019-01-16 DIAGNOSIS — Z7901 Long term (current) use of anticoagulants: Secondary | ICD-10-CM | POA: Diagnosis not present

## 2019-01-16 DIAGNOSIS — I509 Heart failure, unspecified: Secondary | ICD-10-CM | POA: Diagnosis not present

## 2019-01-16 DIAGNOSIS — L97512 Non-pressure chronic ulcer of other part of right foot with fat layer exposed: Secondary | ICD-10-CM | POA: Insufficient documentation

## 2019-01-16 DIAGNOSIS — G9009 Other idiopathic peripheral autonomic neuropathy: Secondary | ICD-10-CM | POA: Insufficient documentation

## 2019-01-16 DIAGNOSIS — I70235 Atherosclerosis of native arteries of right leg with ulceration of other part of foot: Secondary | ICD-10-CM | POA: Diagnosis present

## 2019-01-16 DIAGNOSIS — Z87891 Personal history of nicotine dependence: Secondary | ICD-10-CM | POA: Diagnosis not present

## 2019-01-29 NOTE — Progress Notes (Addendum)
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 Referred by Dr. Stanford Breed for further management of advanced HF.  He has been struggling with HF for some time. Echo in 2017 EF 35-40%.  PYP scan 2019 Grade III suggestive of transthyretin amyloidosis. In 2019 he was started on tafamidis.   He has been followed closely by Dr Stanford Breed.  We saw him a few weeks ago and ReDS fluid coming down on torsemide 40 bid (39%). No changes made.   Today he returns for follow up with his wife. Says his breathing comes and goes. Gets SOB with minimal activity. Taking torsemide 60 daily (was previously on 40 bid - not sure how it was changed). + LE edema. Occasional orthopnea or PND. BP low at times. Weight up from 220 -> 228. BP at home 110-120. A few months ago SBP was in 90s. Seen at [facility] and getting treated for sore on left foot.   ECHO 12/07/2018 -EF 35-40% RV normal, LA dilated, RA severely dilated.  ECHO 03/10/2018 EF 45-50% with  suspected amyloidosis.  Grade II DD, Severe hypertrophy ECHO 2017  EF 35-40% RA/LA dilated, Ef 35-40%  Moderate hypertrophy    Stress Test at [facility] EF 41% no ischemia    Past Medical History:  Diagnosis Date  . Arthritis    "left foot" (06/01/2018)  . Atrial fibrillation ([place])   . CAD (coronary artery disease)   . Cardiomyopathy ([place])   . Chronic systolic CHF (congestive heart failure) ([place]) 04/12/2016  . Dyspnea   . Dysrhythmia Atrial flutter  . 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 Hypertension     Current Outpatient Medications  Medication Sig Dispense Refill  . apixaban (ELIQUIS) 5 MG TABS tablet Take 1 tablet (5 mg total) by mouth 2 (two) times daily. 60 tablet 6  . carvedilol (COREG) 3.125 MG tablet  Take 1 tablet (3.125 mg total) by mouth 2 (two) times daily with a meal. 60 tablet 6  . dofetilide (TIKOSYN) 125 MCG capsule Take 1 capsule (125 mcg total) by mouth 2 (two) times daily. 60 capsule 6  . dorzolamide (TRUSOPT) 2 % ophthalmic solution Place 1 drop into both eyes 2 (two) times daily.    Marland Kitchen latanoprost (XALATAN) 0.005 % ophthalmic solution Place 1 drop into both eyes at bedtime.    . Multiple Vitamins-Minerals (MULTIVITAMIN WITH MINERALS) tablet Take 1 tablet by mouth daily.    . potassium chloride SA (K-DUR,KLOR-CON) 20 MEQ tablet Take 20 mEq by mouth 2 (two) times daily.    . rosuvastatin (CRESTOR) 20 MG tablet Take 1 tablet (20 mg total) by mouth daily. 90 tablet 3  . Tafamidis 61 MG CAPS Take 61 mg by mouth daily.     Marland Kitchen torsemide (DEMADEX) 20 MG tablet Take 2 tablets (40 mg total) by mouth 2 (two) times daily. TAKE AN EXTRA 20 MG As needed when weight = 217 lb 06-16-18 hold for 48 hours 360 tablet 3   No current facility-administered medications for this encounter.     Allergies  Allergen Reactions  . Pradaxa [Dabigatran Etexilate Mesylate] Nausea Only  . Penicillins Rash      Social History   Socioeconomic History  . Marital status: Married  Spouse name: Not on file  . Number of children: 2  . Years of education: Not on file  . Highest education level: Not on file  Occupational History  . Not on file  Social Needs  . Financial resource strain: Not on file  . Food insecurity    Worry: Not on file    Inability: Not on file  . Transportation needs    Medical: Not on file    Non-medical: Not on file  Tobacco Use  . Smoking status: Former Smoker    Packs/day: 0.12    Types: Cigarettes    Quit date: 1990    Years since quitting: 30.6  . Smokeless tobacco: Never Used  . Tobacco comment: 06/01/2018 "smoked off and on for 30 years; don't know how many years I actually smoked"  Substance and Sexual Activity  . Alcohol use: Not Currently    Comment: 06/01/2018  "nothing in the last 10 years or more"  . Drug use: Never  . Sexual activity: Not on file  Lifestyle  . Physical activity    Days per week: Not on file    Minutes per session: Not on file  . Stress: Not on file  Relationships  . Social Herbalist on phone: Not on file    Gets together: Not on file    Attends religious service: Not on file    Active member of club or organization: Not on file    Attends meetings of clubs or organizations: Not on file    Relationship status: Not on file  . Intimate partner violence    Fear of current or ex partner: Not on file    Emotionally abused: Not on file    Physically abused: Not on file    Forced sexual activity: Not on file  Other Topics Concern  . Not on file  Social History Narrative  . Not on file      Family History  Problem Relation Age of Onset  . Heart disease Other        No family history     Vitals:   01/30/19 1221  BP: 118/64  Pulse: 69  SpO2: 90%  Weight: 104.5 kg (230 lb 6.4 oz)   Wt Readings from Last 3 Encounters:  01/30/19 104.5 kg (230 lb 6.4 oz)  12/19/18 104.4 kg (230 lb 3.2 oz)  12/09/18 103 kg (227 lb)    PHYSICAL EXAM: General:  Sitting up on examining table. No resp difficulty HEENT: normal Neck: supple. JVP to jaw Carotids 2+ bilat; no bruits. No lymphadenopathy or thryomegaly appreciated. Cor: PMI nondisplaced. Irregular rate & rhythm. No rubs, gallops or murmurs. Lungs: clear Abdomen: soft, nontender, nondistended. No hepatosplenomegaly. No bruits or masses. Good bowel sounds. Extremities: no cyanosis, clubbing, rash, 2+ edema In orthopedic shoes Neuro: alert & orientedx3, cranial nerves grossly intact. moves all 4 extremities w/o difficulty. Affect pleasant  ECG: Sinus 66 with PACs and 1AVB QTC 463 Personally reviewed  ReDS 38%  ASSESSMENT & PLAN:  1. Chronic Systolic HF due to TTR cardiac amyloidois -ECHO 12/07/2018-EF 35-40% RV normal, LA dilated, RA severely dilated. -  Weight up 8 pounds and appears more volume overloaded on exam however Reds Clip trending down 44>42%>39%>38% - NYHA III-IIIb symptoms - Will increase torsemide from 60 daily to 60/40 for 2-3 days and see how he responds then go back to 60 daily.  - For now continue low dose carvedilol.  - BP has been unable to  tolerate meds in past. If BP stable can try low-dose Entresto at some point but I suspect he may not tolerate - He has been on spiro in the past but later stopped due to CKD.  - Check BMET today.  - Long discussion about natural history of TTR with him and his wife as well as his son Raden by phone. I discussed that average lifespan is 2.5-3 years from diagnosis without treatment. - Given progressive symptoms have strongly suggested considering enrolling at gene silencer study at [facility] with Dr. Lora Paula. The will discuss - Refer to [facility] for family screening - Repeat echo in 3-6 months  2. Cardiac Amyloidosis - Familial TTR - PYP Grade III strongly suggestive of TTR- Started on Tafamidis in 2019.  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 Broadus John for genetic counseling (He has 2 kids and 7 siblings) - We discussed possible gene silencer study at 49 with Dr. Lora Paula and he will let me know (see discussion above)  3. CKD Stage III - Creatinine baseline 1.3-1.4  - Most recent creatinine 1.5 on 12/19/18  - Check BMET today  4. R great toe eschar and blister - Following with Podiatrist at [facility]  5. PAF  - Irregular on exam but ECG with NSR with PACs - On tikosyn & Eliquis. QTc stable at 463 today - Watch renal function closely and adjust as needed - I d/w Dr. Stanford Breed by phone if creatinine bumping or he develops hypokalemia would favor stopping Tikosyn and switching to [facility] after a washout. He will likely not tolerate AF will with cardiac amyloidosis.    Total time spent 55 minutes. Over half that time spent discussing above.     Glori Bickers MD  6:02 PM

## 2019-01-30 ENCOUNTER — Encounter (HOSPITAL_COMMUNITY): Payer: Self-pay | Admitting: Internal Medicine

## 2019-01-30 ENCOUNTER — Other Ambulatory Visit: Payer: Self-pay

## 2019-01-30 ENCOUNTER — Ambulatory Visit (HOSPITAL_COMMUNITY)
Admission: RE | Admit: 2019-01-30 | Discharge: 2019-01-30 | Disposition: A | Discharge status: Home / Self Care | Payer: No Typology Code available for payment source | Source: Ambulatory Visit | Attending: Internal Medicine | Admitting: Internal Medicine

## 2019-01-30 VITALS — BP 118/64 | HR 69 | Wt 230.4 lb

## 2019-01-30 DIAGNOSIS — I130 Hypertensive heart and chronic kidney disease with heart failure and stage 1 through stage 4 chronic kidney disease, or unspecified chronic kidney disease: Secondary | ICD-10-CM | POA: Insufficient documentation

## 2019-01-30 DIAGNOSIS — I5022 Chronic systolic (congestive) heart failure: Secondary | ICD-10-CM | POA: Diagnosis not present

## 2019-01-30 DIAGNOSIS — M1990 Unspecified osteoarthritis, unspecified site: Secondary | ICD-10-CM | POA: Diagnosis not present

## 2019-01-30 DIAGNOSIS — I480 Paroxysmal atrial fibrillation: Secondary | ICD-10-CM | POA: Diagnosis not present

## 2019-01-30 DIAGNOSIS — Z79899 Other long term (current) drug therapy: Secondary | ICD-10-CM | POA: Insufficient documentation

## 2019-01-30 DIAGNOSIS — N183 Chronic kidney disease, stage 3 (moderate): Secondary | ICD-10-CM | POA: Insufficient documentation

## 2019-01-30 DIAGNOSIS — E854 Organ-limited amyloidosis: Secondary | ICD-10-CM | POA: Diagnosis not present

## 2019-01-30 DIAGNOSIS — Z7901 Long term (current) use of anticoagulants: Secondary | ICD-10-CM | POA: Insufficient documentation

## 2019-01-30 DIAGNOSIS — I5042 Chronic combined systolic (congestive) and diastolic (congestive) heart failure: Secondary | ICD-10-CM | POA: Diagnosis not present

## 2019-01-30 DIAGNOSIS — E8589 Other amyloidosis: Secondary | ICD-10-CM

## 2019-01-30 DIAGNOSIS — I43 Cardiomyopathy in diseases classified elsewhere: Secondary | ICD-10-CM | POA: Diagnosis not present

## 2019-01-30 DIAGNOSIS — I2510 Atherosclerotic heart disease of native coronary artery without angina pectoris: Secondary | ICD-10-CM | POA: Diagnosis not present

## 2019-01-30 DIAGNOSIS — Z87891 Personal history of nicotine dependence: Secondary | ICD-10-CM | POA: Diagnosis not present

## 2019-01-30 DIAGNOSIS — I5032 Chronic diastolic (congestive) heart failure: Secondary | ICD-10-CM | POA: Diagnosis not present

## 2019-01-30 DIAGNOSIS — R0602 Shortness of breath: Secondary | ICD-10-CM | POA: Insufficient documentation

## 2019-01-30 LAB — BASIC METABOLIC PANEL
Anion gap: 12 (ref 5–15)
BUN: 21 mg/dL (ref 8–23)
CO2: 22 mmol/L (ref 22–32)
Calcium: 9.3 mg/dL (ref 8.9–10.3)
Chloride: 102 mmol/L (ref 98–111)
Creatinine, Ser: 1.25 mg/dL — ABNORMAL HIGH (ref 0.61–1.24)
GFR calc Af Amer: >60 mL/min (ref >60)
GFR calc non Af Amer: 55 mL/min — ABNORMAL LOW (ref >60)
Glucose, Bld: 124 mg/dL — ABNORMAL HIGH (ref 70–99)
Potassium: 4.2 mmol/L (ref 3.5–5.1)
Sodium: 136 mmol/L (ref 135–145)

## 2019-01-30 LAB — MAGNESIUM: Magnesium: 2.2 mg/dL (ref 1.7–2.4)

## 2019-01-30 LAB — BRAIN NATRIURETIC PEPTIDE: B Natriuretic Peptide: 706 pg/mL — ABNORMAL HIGH (ref 0.0–100.0)

## 2019-01-30 MED ORDER — TORSEMIDE 20 MG PO TABS: ORAL_TABLET | ORAL | 3 refills | Status: DC

## 2019-01-30 MED ORDER — TORSEMIDE 20 MG PO TABS: 60.0000 mg | ORAL_TABLET | Freq: Every morning | ORAL | 3 refills | Status: DC

## 2019-01-30 MED ORDER — POTASSIUM CHLORIDE CRYS ER 20 MEQ PO TBCR: EXTENDED_RELEASE_TABLET | ORAL | 3 refills | Status: DC

## 2019-01-30 NOTE — Patient Instructions (Addendum)
Lab work done today. We will notify you of any abnormal lab work. No news is good news!  Lab work will need to be done again in 1 week.  EKG done today.   INCREASE Torsemide to 60mg  every morning and 40mg  every evening FOR 2 DAYS ONLY THEN decrease back to 60 mg (3 tabs) every Morning  INCREASE Potassium to 70meq every morning and 104meq every evening FOR 2 DAYS ONLY THEN decrease back to 40 meq (2 tabs) every Morning  Please follow up with the [facility] in 4 weeks.  You have been referred to Dr Broadus John, genetic counselor at [facility], her office will call you for an appointment  At the [facility], you and your health needs are our priority. As part of our continuing mission to provide you with exceptional heart care, we have created designated Provider Care Teams. These Care Teams include your primary Cardiologist (physician) and Advanced Practice Providers (APPs- Physician Assistants and Nurse Practitioners) who all work together to provide you with the care you need, when you need it.   You may see any of the following providers on your designated Care Team at your next follow up: Marland Kitchen Dr Glori Bickers . Dr Loralie Champagne . Darrick Grinder, NP   Please be sure to bring in all your medications bottles to every appointment.

## 2019-02-02 ENCOUNTER — Encounter (HOSPITAL_COMMUNITY): Payer: Self-pay | Admitting: *Deleted

## 2019-02-02 NOTE — Progress Notes (Signed)
Per last OV note, pt's wife called back and they have decided to go ahead with referral to Dr Lora Paula at [facility] for possible gene silencer study.  Pt's demographic and records sent to him at [email or web address]

## 2019-02-03 ENCOUNTER — Telehealth: Payer: Self-pay | Admitting: Cardiology

## 2019-02-03 NOTE — Telephone Encounter (Signed)
Patient's wife wants to come in to appt with him because she has many questions regarding husband's condition.  I have explained our no visitor policy but she is not accepting that so I told her it would have to be approved by Dr Stanford Breed

## 2019-02-07 NOTE — Progress Notes (Signed)
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 Had stress test 8/17 at [facility] that showed EF 41 and no ischemia. Pt admitted 11/17 with dyspnea and right pleural effusion. Had thoracentesis and cytology negative; diuresed. TEE 11/17 showed EF 35-40, biatrial enlargement and moderate TR. Echocardiogram October 2019 showed ejection fraction 45 to 50%; severe left ventricular hypertrophy and amyloid should be considered; moderate diastolic dysfunction; mildly dilated ascending aorta, biatrial enlargement and mild tricuspid regurgitation. CTA October 2019 showed large right pleural effusion. 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 Follow-up echocardiogram July 2020 showed ejection fraction 35 to 40%, biatrial enlargement.  Since last seen,he does have some dyspnea and fatigue with activities.  There is orthopnea.  He denies chest pain but he has had mucus production with cough.  No hemoptysis.  He has had bilateral lower extremity edema and over the past 24 to 36 hours has developed warmth and erythema of left lower extremity.  No fevers or chills.  Current Outpatient Medications  Medication Sig Dispense Refill  . apixaban (ELIQUIS) 5 MG TABS tablet Take 1 tablet (5 mg total) by mouth 2 (two) times daily. 60 tablet 6  . carvedilol (COREG) 3.125 MG tablet Take 1 tablet (3.125 mg total) by mouth 2 (two) times daily with a meal. 60 tablet 6  . dofetilide (TIKOSYN) 125 MCG capsule Take 1 capsule (125 mcg total) by mouth 2 (two) times daily. 60 capsule 6  . dorzolamide (TRUSOPT) 2 % ophthalmic solution Place 1 drop into both eyes 2 (two) times daily.    Marland Kitchen latanoprost (XALATAN) 0.005 % ophthalmic solution Place 1 drop into both eyes at bedtime.    . Multiple Vitamins-Minerals (MULTIVITAMIN WITH MINERALS) tablet Take 1 tablet by mouth daily.    . potassium  chloride SA (K-DUR) 20 MEQ tablet Take 2 tablets (40 mEq total) by mouth every morning AND 1 tablet (20 mEq total) every evening. 270 tablet 3  . rosuvastatin (CRESTOR) 20 MG tablet Take 1 tablet (20 mg total) by mouth daily. 90 tablet 3  . Tafamidis 61 MG CAPS Take 61 mg by mouth daily.     Marland Kitchen torsemide (DEMADEX) 20 MG tablet Take 3 tablets (60 mg total) by mouth every morning. 150 tablet 3   No current facility-administered medications for this visit.      Past Medical History:  Diagnosis Date  . Arthritis    "left foot" (06/01/2018)  . Atrial fibrillation ([place])   . CAD (coronary artery disease)   . Cardiomyopathy ([place])   . Chronic systolic CHF (congestive heart failure) ([place]) 04/12/2016  . Dyspnea   . Dysrhythmia Atrial flutter  . 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 Hypertension     Past Surgical History:  Procedure Laterality Date  . CARDIOVERSION N/A 09/04/2015   Procedure: CARDIOVERSION;  Surgeon: Skeet Latch, MD;  Location: [place];  Service: Cardiovascular;  Laterality: N/A;  . IR THORACENTESIS ASP PLEURAL SPACE W/IMG GUIDE  06/02/2018  . TEE WITHOUT CARDIOVERSION N/A 09/04/2015   Procedure: TRANSESOPHAGEAL ECHOCARDIOGRAM (TEE);  Surgeon: Skeet Latch, MD;  Location: [place];  Service: Cardiovascular;  Laterality: N/A;  . TEE WITHOUT CARDIOVERSION N/A 04/15/2016   Procedure: TRANSESOPHAGEAL ECHOCARDIOGRAM (TEE);  Surgeon: Jerline Pain, MD;  Location: [facility] ENDOSCOPY;  Service: Cardiovascular;  Laterality: N/A;  . TOE AMPUTATION Right    4th digit  Social History   Socioeconomic History  . Marital status: Married    Spouse name: Not on file  . Number of children: 2  . Years of education: Not on file  . Highest education level: Not on file  Occupational History  . Not on file  Social Needs  . Financial resource strain: Not on file  . Food insecurity    Worry: Not on file    Inability: Not on file  .  Transportation needs    Medical: Not on file    Non-medical: Not on file  Tobacco Use  . Smoking status: Former Smoker    Packs/day: 0.12    Types: Cigarettes    Quit date: 1990    Years since quitting: 30.7  . Smokeless tobacco: Never Used  . Tobacco comment: 06/01/2018 "smoked off and on for 30 years; don't know how many years I actually smoked"  Substance and Sexual Activity  . Alcohol use: Not Currently    Comment: 06/01/2018 "nothing in the last 10 years or more"  . Drug use: Never  . Sexual activity: Not on file  Lifestyle  . Physical activity    Days per week: Not on file    Minutes per session: Not on file  . Stress: Not on file  Relationships  . Social Herbalist on phone: Not on file    Gets together: Not on file    Attends religious service: Not on file    Active member of club or organization: Not on file    Attends meetings of clubs or organizations: Not on file    Relationship status: Not on file  . Intimate partner violence    Fear of current or ex partner: Not on file    Emotionally abused: Not on file    Physically abused: Not on file    Forced sexual activity: Not on file  Other Topics Concern  . Not on file  Social History Narrative  . Not on file    Family History  Problem Relation Age of Onset  . Heart disease Other        No family history    ROS: Describes "mucus production" but no fevers or chills, hemoptysis, dysphasia, odynophagia, melena, hematochezia, dysuria, hematuria, rash, seizure activity, orthopnea, PND, claudication. Remaining systems are negative.  Physical Exam: Well-developed chronically ill appearing in no acute distress.  Skin is warm and dry.  HEENT is normal.  Neck is supple.  Chest with diminished BS bases Cardiovascular exam is regular rate and rhythm.  Abdominal exam nontender or distended. No masses palpated. Extremities show 2+ edema left lower extremity.  There is erythema and warmth over the mid tibia.  1+  edema on the right. neuro grossly intact  ECG-sinus rhythm with first-degree AV block, low voltage.  Personally reviewed  A/P  1 acute on chronic combined systolic/diastolic congestive heart failure-patient is volume overloaded.  He also appears to have cellulitis of left lower extremity.  I have discussed the patient with Dr. Tamala Julian of the hospitalist service and Dr. Haroldine Laws.  Patient will be admitted for cellulitis and acute on chronic combined systolic/diastolic congestive heart failure.  Would diurese with Lasix 120 mg IV twice daily.  Follow renal function.  2 probable left lower extremity cellulitis-patient will be admitted by the hospitalist service for IV antibiotics. May need arterial Dopplers of left lower extremity but he does have 2+ dorsalis pedis pulse.  I doubt DVT as patient is on chronic  anticoagulation.  2 amyloidosis- Continue tafamidis; he has been referred to [facility] for study medication; I discussed this with patient and his wife today and they are agreeable to participate.  Dr. Haroldine Laws will make arrangements..  3 cardiomyopathy-we will continue low-dose carvedilol.  Will not add an ARB at this point but can consider if renal function stable and blood pressure will allow during hospitalization.  4 paroxysmal atrial fibrillation-patient remains in sinus rhythm.  We will continue Tikosyn for now.  If his renal function deteriorates with diuresis will transition to amiodarone.  Continue beta-blocker and apixaban.  5 hypertension-patient's blood pressure is controlled.  Continue present medications as outlined.  6 coronary artery disease-diagnosed previously based on CT scan showing coronary calcification.  Continue statin.  No aspirin given need for anticoagulation.  7 chronic stage III kidney disease-check potassium and renal function.  Kirk Ruths, MD

## 2019-02-07 NOTE — Telephone Encounter (Signed)
Okay for patient's wife to come with him during visit. Christopher Benton

## 2019-02-07 NOTE — Telephone Encounter (Signed)
Called patient and LVM advising of okay for patient to come to appointment. Will place in notes.

## 2019-02-07 NOTE — Telephone Encounter (Signed)
Okay for patient to come to appointment tomorrow 09/09. Thanks!

## 2019-02-08 ENCOUNTER — Encounter: Payer: Self-pay | Admitting: Cardiology

## 2019-02-08 ENCOUNTER — Observation Stay (HOSPITAL_COMMUNITY): Payer: No Typology Code available for payment source

## 2019-02-08 ENCOUNTER — Ambulatory Visit: Payer: No Typology Code available for payment source | Admitting: Cardiology

## 2019-02-08 ENCOUNTER — Inpatient Hospital Stay (HOSPITAL_COMMUNITY)
Admission: AD | Admit: 2019-02-08 | Discharge: 2019-02-15 | DRG: 602 | Disposition: A | Discharge status: Home / Self Care | Payer: No Typology Code available for payment source | Source: Ambulatory Visit | Attending: Internal Medicine | Admitting: Internal Medicine

## 2019-02-08 ENCOUNTER — Encounter (HOSPITAL_COMMUNITY): Payer: Self-pay | Admitting: General Practice

## 2019-02-08 ENCOUNTER — Ambulatory Visit (INDEPENDENT_AMBULATORY_CARE_PROVIDER_SITE_OTHER): Payer: No Typology Code available for payment source | Admitting: Cardiology

## 2019-02-08 ENCOUNTER — Other Ambulatory Visit: Payer: Self-pay

## 2019-02-08 VITALS — BP 110/64 | HR 83 | Ht 71.0 in | Wt 229.4 lb

## 2019-02-08 DIAGNOSIS — K219 Gastro-esophageal reflux disease without esophagitis: Secondary | ICD-10-CM | POA: Diagnosis present

## 2019-02-08 DIAGNOSIS — I5043 Acute on chronic combined systolic (congestive) and diastolic (congestive) heart failure: Secondary | ICD-10-CM | POA: Diagnosis present

## 2019-02-08 DIAGNOSIS — L02416 Cutaneous abscess of left lower limb: Secondary | ICD-10-CM

## 2019-02-08 DIAGNOSIS — I484 Atypical atrial flutter: Secondary | ICD-10-CM

## 2019-02-08 DIAGNOSIS — E854 Organ-limited amyloidosis: Secondary | ICD-10-CM

## 2019-02-08 DIAGNOSIS — Z79899 Other long term (current) drug therapy: Secondary | ICD-10-CM

## 2019-02-08 DIAGNOSIS — I5042 Chronic combined systolic (congestive) and diastolic (congestive) heart failure: Secondary | ICD-10-CM

## 2019-02-08 DIAGNOSIS — I10 Essential (primary) hypertension: Secondary | ICD-10-CM | POA: Diagnosis present

## 2019-02-08 DIAGNOSIS — L03116 Cellulitis of left lower limb: Principal | ICD-10-CM

## 2019-02-08 DIAGNOSIS — Z7901 Long term (current) use of anticoagulants: Secondary | ICD-10-CM

## 2019-02-08 DIAGNOSIS — N183 Chronic kidney disease, stage 3 unspecified: Secondary | ICD-10-CM | POA: Diagnosis present

## 2019-02-08 DIAGNOSIS — E669 Obesity, unspecified: Secondary | ICD-10-CM | POA: Diagnosis present

## 2019-02-08 DIAGNOSIS — Z87891 Personal history of nicotine dependence: Secondary | ICD-10-CM

## 2019-02-08 DIAGNOSIS — Z20828 Contact with and (suspected) exposure to other viral communicable diseases: Secondary | ICD-10-CM | POA: Diagnosis present

## 2019-02-08 DIAGNOSIS — I43 Cardiomyopathy in diseases classified elsewhere: Secondary | ICD-10-CM | POA: Diagnosis not present

## 2019-02-08 DIAGNOSIS — I2510 Atherosclerotic heart disease of native coronary artery without angina pectoris: Secondary | ICD-10-CM | POA: Diagnosis present

## 2019-02-08 DIAGNOSIS — Z888 Allergy status to other drugs, medicaments and biological substances status: Secondary | ICD-10-CM

## 2019-02-08 DIAGNOSIS — I480 Paroxysmal atrial fibrillation: Secondary | ICD-10-CM

## 2019-02-08 DIAGNOSIS — Z6832 Body mass index (BMI) 32.0-32.9, adult: Secondary | ICD-10-CM

## 2019-02-08 DIAGNOSIS — L03032 Cellulitis of left toe: Secondary | ICD-10-CM | POA: Diagnosis not present

## 2019-02-08 DIAGNOSIS — E876 Hypokalemia: Secondary | ICD-10-CM | POA: Diagnosis not present

## 2019-02-08 DIAGNOSIS — E859 Amyloidosis, unspecified: Secondary | ICD-10-CM | POA: Diagnosis not present

## 2019-02-08 DIAGNOSIS — R0600 Dyspnea, unspecified: Secondary | ICD-10-CM

## 2019-02-08 DIAGNOSIS — Z880 Allergy status to penicillin: Secondary | ICD-10-CM

## 2019-02-08 DIAGNOSIS — L97519 Non-pressure chronic ulcer of other part of right foot with unspecified severity: Secondary | ICD-10-CM | POA: Diagnosis present

## 2019-02-08 DIAGNOSIS — L0390 Cellulitis, unspecified: Secondary | ICD-10-CM | POA: Diagnosis present

## 2019-02-08 DIAGNOSIS — I130 Hypertensive heart and chronic kidney disease with heart failure and stage 1 through stage 4 chronic kidney disease, or unspecified chronic kidney disease: Secondary | ICD-10-CM | POA: Diagnosis present

## 2019-02-08 DIAGNOSIS — Z89421 Acquired absence of other right toe(s): Secondary | ICD-10-CM

## 2019-02-08 HISTORY — DX: Cellulitis of left lower limb: L03.116

## 2019-02-08 HISTORY — DX: Cutaneous abscess of left lower limb: L02.416

## 2019-02-08 LAB — COMPREHENSIVE METABOLIC PANEL
ALT: 16 U/L (ref 0–44)
AST: 23 U/L (ref 15–41)
Albumin: 3.9 g/dL (ref 3.5–5.0)
Alkaline Phosphatase: 87 U/L (ref 38–126)
Anion gap: 11 (ref 5–15)
BUN: 27 mg/dL — ABNORMAL HIGH (ref 8–23)
CO2: 24 mmol/L (ref 22–32)
Calcium: 9.5 mg/dL (ref 8.9–10.3)
Chloride: 99 mmol/L (ref 98–111)
Creatinine, Ser: 1.43 mg/dL — ABNORMAL HIGH (ref 0.61–1.24)
GFR calc Af Amer: 54 mL/min — ABNORMAL LOW (ref >60)
GFR calc non Af Amer: 47 mL/min — ABNORMAL LOW (ref >60)
Glucose, Bld: 100 mg/dL — ABNORMAL HIGH (ref 70–99)
Potassium: 4.6 mmol/L (ref 3.5–5.1)
Sodium: 134 mmol/L — ABNORMAL LOW (ref 135–145)
Total Bilirubin: 3.5 mg/dL — ABNORMAL HIGH (ref 0.3–1.2)
Total Protein: 8 g/dL (ref 6.5–8.1)

## 2019-02-08 LAB — CBC WITH DIFFERENTIAL/PLATELET
Abs Immature Granulocytes: 0.06 10*3/uL (ref 0.00–0.07)
Basophils Absolute: 0 10*3/uL (ref 0.0–0.1)
Basophils Relative: 0 %
Eosinophils Absolute: 0.1 10*3/uL (ref 0.0–0.5)
Eosinophils Relative: 0 %
HCT: 51.7 % (ref 39.0–52.0)
Hemoglobin: 16.6 g/dL (ref 13.0–17.0)
Immature Granulocytes: 0 %
Lymphocytes Relative: 13 %
Lymphs Abs: 1.8 10*3/uL (ref 0.7–4.0)
MCH: 28.1 pg (ref 26.0–34.0)
MCHC: 32.1 g/dL (ref 30.0–36.0)
MCV: 87.5 fL (ref 80.0–100.0)
Monocytes Absolute: 1.6 10*3/uL — ABNORMAL HIGH (ref 0.1–1.0)
Monocytes Relative: 11 %
Neutro Abs: 10.7 10*3/uL — ABNORMAL HIGH (ref 1.7–7.7)
Neutrophils Relative %: 76 %
Platelets: 191 10*3/uL (ref 150–400)
RBC: 5.91 MIL/uL — ABNORMAL HIGH (ref 4.22–5.81)
RDW: 13.2 % (ref 11.5–15.5)
WBC: 14.3 10*3/uL — ABNORMAL HIGH (ref 4.0–10.5)
nRBC: 0 % (ref 0.0–0.2)

## 2019-02-08 LAB — BASIC METABOLIC PANEL
Anion gap: 11 (ref 5–15)
BUN: 28 mg/dL — ABNORMAL HIGH (ref 8–23)
CO2: 22 mmol/L (ref 22–32)
Calcium: 9.4 mg/dL (ref 8.9–10.3)
Chloride: 101 mmol/L (ref 98–111)
Creatinine, Ser: 1.49 mg/dL — ABNORMAL HIGH (ref 0.61–1.24)
GFR calc Af Amer: 51 mL/min — ABNORMAL LOW (ref >60)
GFR calc non Af Amer: 44 mL/min — ABNORMAL LOW (ref >60)
Glucose, Bld: 115 mg/dL — ABNORMAL HIGH (ref 70–99)
Potassium: 4.4 mmol/L (ref 3.5–5.1)
Sodium: 134 mmol/L — ABNORMAL LOW (ref 135–145)

## 2019-02-08 LAB — MAGNESIUM
Magnesium: 2.3 mg/dL (ref 1.7–2.4)
Magnesium: 2.2 mg/dL (ref 1.7–2.4)

## 2019-02-08 MED ORDER — ACETAMINOPHEN 650 MG RE SUPP: 650.0000 mg | Freq: Four times a day (QID) | RECTAL | Status: DC | PRN

## 2019-02-08 MED ORDER — DOFETILIDE 125 MCG PO CAPS
125.0000 ug | ORAL_CAPSULE | Freq: Two times a day (BID) | ORAL | Status: DC
  Administered 2019-02-08 – 2019-02-15 (×14): 125 ug via ORAL
  Filled 2019-02-08 (×14): qty 1

## 2019-02-08 MED ORDER — ACETAMINOPHEN 325 MG PO TABS
650.0000 mg | ORAL_TABLET | Freq: Four times a day (QID) | ORAL | Status: DC | PRN
  Administered 2019-02-11 – 2019-02-14 (×6): 650 mg via ORAL
  Filled 2019-02-08 (×6): qty 2

## 2019-02-08 MED ORDER — FUROSEMIDE 10 MG/ML IJ SOLN: 120.0000 mg | Freq: Two times a day (BID) | INTRAVENOUS | Status: DC

## 2019-02-08 MED ORDER — TRAMADOL HCL 50 MG PO TABS
50.0000 mg | ORAL_TABLET | Freq: Four times a day (QID) | ORAL | Status: DC | PRN
  Administered 2019-02-08 – 2019-02-15 (×18): 50 mg via ORAL
  Filled 2019-02-08 (×20): qty 1

## 2019-02-08 MED ORDER — SODIUM CHLORIDE 0.9 % IV SOLN
1.0000 g | INTRAVENOUS | Status: AC
  Administered 2019-02-09: 1 g via INTRAVENOUS
  Filled 2019-02-08: qty 10

## 2019-02-08 MED ORDER — SENNOSIDES-DOCUSATE SODIUM 8.6-50 MG PO TABS
1.0000 | ORAL_TABLET | Freq: Every evening | ORAL | Status: DC | PRN
  Administered 2019-02-09 – 2019-02-15 (×4): 1 via ORAL
  Filled 2019-02-08 (×4): qty 1

## 2019-02-08 MED ORDER — TAFAMIDIS 61 MG PO CAPS
61.0000 mg | ORAL_CAPSULE | Freq: Every day | ORAL | Status: DC
  Administered 2019-02-10 – 2019-02-15 (×6): 61 mg via ORAL
  Filled 2019-02-08 (×5): qty 1

## 2019-02-08 MED ORDER — CARVEDILOL 3.125 MG PO TABS
3.1250 mg | ORAL_TABLET | Freq: Two times a day (BID) | ORAL | Status: DC
  Filled 2019-02-08: qty 1

## 2019-02-08 MED ORDER — ONDANSETRON HCL 4 MG/2ML IJ SOLN: 4.0000 mg | Freq: Four times a day (QID) | INTRAMUSCULAR | Status: DC | PRN

## 2019-02-08 MED ORDER — VANCOMYCIN HCL 10 G IV SOLR
1500.0000 mg | INTRAVENOUS | Status: DC
  Administered 2019-02-09 – 2019-02-11 (×3): 1500 mg via INTRAVENOUS
  Filled 2019-02-08 (×4): qty 1500

## 2019-02-08 MED ORDER — ONDANSETRON HCL 4 MG PO TABS
4.0000 mg | ORAL_TABLET | Freq: Four times a day (QID) | ORAL | Status: DC | PRN
  Administered 2019-02-09: 4 mg via ORAL
  Filled 2019-02-08: qty 1

## 2019-02-08 MED ORDER — SODIUM CHLORIDE 0.9 % IV SOLN
INTRAVENOUS | Status: DC | PRN
  Administered 2019-02-08 – 2019-02-13 (×7): 250 mL via INTRAVENOUS

## 2019-02-08 MED ORDER — APIXABAN 5 MG PO TABS
5.0000 mg | ORAL_TABLET | Freq: Two times a day (BID) | ORAL | Status: DC
  Administered 2019-02-08 – 2019-02-15 (×14): 5 mg via ORAL
  Filled 2019-02-08 (×14): qty 1

## 2019-02-08 MED ORDER — ROSUVASTATIN CALCIUM 20 MG PO TABS
20.0000 mg | ORAL_TABLET | Freq: Every day | ORAL | Status: DC
  Administered 2019-02-09 – 2019-02-15 (×7): 20 mg via ORAL
  Filled 2019-02-08 (×7): qty 1

## 2019-02-08 MED ORDER — VANCOMYCIN HCL 10 G IV SOLR
1750.0000 mg | Freq: Once | INTRAVENOUS | Status: AC
  Administered 2019-02-08: 1750 mg via INTRAVENOUS
  Filled 2019-02-08: qty 1750

## 2019-02-08 MED ORDER — POTASSIUM CHLORIDE CRYS ER 20 MEQ PO TBCR
40.0000 meq | EXTENDED_RELEASE_TABLET | Freq: Once | ORAL | Status: AC
  Administered 2019-02-08: 40 meq via ORAL
  Filled 2019-02-08: qty 2

## 2019-02-08 MED ORDER — SODIUM CHLORIDE 0.9 % IV SOLN
1.0000 g | INTRAVENOUS | Status: DC
  Administered 2019-02-10 – 2019-02-13 (×4): 1 g via INTRAVENOUS
  Filled 2019-02-08 (×4): qty 10

## 2019-02-08 MED ORDER — DORZOLAMIDE HCL 2 % OP SOLN
1.0000 [drp] | Freq: Two times a day (BID) | OPHTHALMIC | Status: DC
  Administered 2019-02-09 – 2019-02-15 (×13): 1 [drp] via OPHTHALMIC
  Filled 2019-02-08: qty 10

## 2019-02-08 MED ORDER — CARVEDILOL 3.125 MG PO TABS
3.1250 mg | ORAL_TABLET | Freq: Two times a day (BID) | ORAL | Status: DC
  Administered 2019-02-08 – 2019-02-15 (×14): 3.125 mg via ORAL
  Filled 2019-02-08 (×14): qty 1

## 2019-02-08 MED ORDER — LATANOPROST 0.005 % OP SOLN
1.0000 [drp] | Freq: Every day | OPHTHALMIC | Status: DC
  Administered 2019-02-09 – 2019-02-14 (×6): 1 [drp] via OPHTHALMIC
  Filled 2019-02-08: qty 2.5

## 2019-02-08 NOTE — H&P (Addendum)
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 [name] office with complaints of left leg swelling, redness and pain.  Patient is a poor historian and wife present at bedside mostly provides history.  Patient started having increasing redness and pain in the left leg for the last 2 to 3 days.  He also has been having increasing swelling of the left lower extremity for the last few days.  No history of fever, nausea, vomiting, cough, animal or insect bite to the extremity, diarrhea or decreased urine output.  Patient complains of slightly increased shortness of breath with orthopnea along with fatigue.  Patient was sent for direct admission to the hospital under [facility] service to start antibiotics and intravenous Lasix.   Review of Systems: As per HPI otherwise 10 point systems were reviewed and were negative.  Past Medical History:  Diagnosis Date  . Arthritis    "left foot" (06/01/2018)  . Atrial fibrillation ([place])   . CAD (coronary artery disease)   . Cardiomyopathy ([place])   . Cellulitis and abscess of left leg 02/08/2019  . Chronic systolic CHF (congestive heart failure) ([place]) 04/12/2016  . Dyspnea   . Dysrhythmia Atrial flutter  . 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 Hypertension     Past Surgical History:  Procedure Laterality Date  . CARDIOVERSION N/A 09/04/2015   Procedure: CARDIOVERSION;  Surgeon: Skeet Latch, MD;   Location: [place];  Service: Cardiovascular;  Laterality: N/A;  . IR THORACENTESIS ASP PLEURAL SPACE W/IMG GUIDE  06/02/2018  . TEE WITHOUT CARDIOVERSION N/A 09/04/2015   Procedure: TRANSESOPHAGEAL ECHOCARDIOGRAM (TEE);  Surgeon: Skeet Latch, MD;  Location: [place];  Service: Cardiovascular;  Laterality: N/A;  . TEE WITHOUT CARDIOVERSION N/A 04/15/2016   Procedure: TRANSESOPHAGEAL ECHOCARDIOGRAM (TEE);  Surgeon: Jerline Pain, MD;  Location: [facility] ENDOSCOPY;  Service: Cardiovascular;  Laterality: N/A;  . TOE AMPUTATION Right    4th digit   Social history  reports that he quit smoking about 30 years ago. His smoking use included cigarettes. He smoked 0.12 packs per day. He has never used smokeless tobacco. He reports previous alcohol use. He reports that he does not use drugs.  Allergies  Allergen Reactions  . Pradaxa [Dabigatran Etexilate Mesylate] Nausea Only  . Penicillins Rash    Family History  Problem Relation Age of Onset  . Heart disease Other        No family history    Prior to Admission medications   Medication Sig Start Date End Date Taking? Authorizing Provider  apixaban (ELIQUIS) 5 MG TABS tablet Take 1 tablet (5 mg total) by mouth 2 (two) times daily. 08/28/15   Lelon Perla, MD  carvedilol (COREG) 3.125 MG tablet Take 1 tablet (3.125 mg total) by mouth 2 (two) times daily with a meal. 06/07/18   Tillery, Satira Mccallum, PA-C  dofetilide (TIKOSYN) 125 MCG capsule Take 1 capsule (125 mcg total) by mouth 2 (two) times daily. 06/07/18   Shirley Friar, PA-C  dorzolamide (TRUSOPT)  2 % ophthalmic solution Place 1 drop into both eyes 2 (two) times daily.    [provider]  latanoprost (XALATAN) 0.005 % ophthalmic solution Place 1 drop into both eyes at bedtime.    [provider]  Multiple Vitamins-Minerals (MULTIVITAMIN WITH MINERALS) tablet Take 1 tablet by mouth daily.    [provider]  potassium chloride SA (K-DUR) 20 MEQ  tablet Take 2 tablets (40 mEq total) by mouth every morning AND 1 tablet (20 mEq total) every evening. 01/30/19   Bensimhon, Shaune Pascal, MD  rosuvastatin (CRESTOR) 20 MG tablet Take 1 tablet (20 mg total) by mouth daily. 09/14/18 02/08/19  Skeet Latch, MD  Tafamidis 61 MG CAPS Take 61 mg by mouth daily.     [provider]  torsemide (DEMADEX) 20 MG tablet Take 3 tablets (60 mg total) by mouth every morning. 01/30/19   Bensimhon, Shaune Pascal, MD    Physical Exam: Vitals:   02/08/19 1744  BP: 115/88  Pulse: 79  Resp: (!) 22  Temp: 99.2 F (37.3 C)  TempSrc: Oral  SpO2: 97%  Weight: 103.5 kg  Height: 5\' 11"  (1.803 m)    Constitutional: NAD, calm, comfortable.  Looks chronically ill. Vitals:   02/08/19 1744  BP: 115/88  Pulse: 79  Resp: (!) 22  Temp: 99.2 F (37.3 C)  TempSrc: Oral  SpO2: 97%  Weight: 103.5 kg  Height: 5\' 11"  (1.803 m)   Eyes: PERRL, lids and conjunctivae normal ENMT: Mucous membranes are moist. Posterior pharynx clear of any exudate or lesions. Neck: normal, supple, no masses, no thyromegaly Respiratory: bilateral decreased breath sounds at bases with scattered crackles, mostly in the bases. Normal respiratory effort. No accessory muscle use.  Cardiovascular: S1 S2 positive, rate controlled.  Bilateral lower extremity pitting pedal edema; mostly on the left side. 2+ pedal pulses.  Abdomen: no tenderness, no masses palpated. No hepatosplenomegaly. Bowel sounds positive.  Musculoskeletal: no clubbing / cyanosis. No joint deformity upper and lower extremities.  Skin: Left lower extremity is swollen and red with erythema from the dorsum of the foot to almost mid shin area.  Right great toe has an ulcer with no drainage and no surrounding erythema. Neurologic: CN 2-12 grossly intact. Moving extremities. No focal neurologic deficits.  Psychiatric: Flat affect.  Poor historian.   Labs on Admission: No labs or imaging are available at this time as patient is  a direct admission  Assessment/Plan  Probable left lower extremity bacterial cellulitis -We will start the patient on Rocephin and vancomycin.  Blood cultures.  Stat CBC and CMP. -Patient's wife tells me that patient had a lower extremity duplex done recently somewhere else and was not told that there was anything abnormal.  No official report in the system to review.  Acute on chronic combined systolic and diastolic heart failure -Echo in July 2020 had shown EF of 35-40 % with biatrial enlargement  -spoke to Dr. Crenshaw/cardiology on phone.  Dr. Haroldine Laws is aware of the patient and will evaluate the patient in a.m.  Will start Lasix 120 mg IV every 12 hours.  Follow renal function. -Strict input output.  Daily weights.  Fluid restriction.  Not a candidate for ACE inhibitors or ARB because of renal failure.  Continue Coreg.  Amyloidosis -Continue tafamidis.  Outpatient follow-up  Paroxysmal A. Fib -Currently in sinus rhythm.  Continue Tikosyn.  Monitor daily magnesium.  Continue Coreg and Eliquis.  Hypertension  -Monitor blood pressure.  Continue Lasix, Coreg  Chronic renal  disease stage III -Last known creatinine as an outpatient was stable.  Will check renal function and monitor.  Coronary artery disease -Diagnosed previously based on CT scan showing coronary calcification.  Continue statin.  No aspirin as patient is on Eliquis.  Chronic right great toe ulcer: Present on admission -Looks uninfected.  Outpatient follow-up with podiatry.  Will request wound care consult.  DVT prophylaxis: Eliquis Code Status: Full Family Communication: Wife at bedside Disposition Plan: Probable home in 1 to 2 days if clinically improved Consults called: Spoke to Dr. Crenshaw/cardiology who has already spoken to Dr. Haroldine Laws  admission status: Observation/telemetry  Severity of Illness: The appropriate patient status for this patient is OBSERVATION. Observation status is judged to be  reasonable and necessary in order to provide the required intensity of service to ensure the patient's safety. The patient's presenting symptoms, physical exam findings, and initial radiographic and laboratory data in the context of their medical condition is felt to place them at decreased risk for further clinical deterioration. Furthermore, it is anticipated that the patient will be medically stable for discharge from the hospital within 2 midnights of admission. The following factors support the patient status of observation.   " The patient's presenting symptoms include left leg redness and swelling along with shortness of breath. " The physical exam findings include left lower extremity erythema and tenderness. " The initial radiographic and laboratory data are unavailable at this time.      Aline August MD [facility]  02/08/2019, 6:16 PM

## 2019-02-08 NOTE — Consult Note (Addendum)
Advanced Heart Failure Team Consult Note   Primary Physician: [name], MD PCP-Cardiologist:  Kirk Ruths, MD  Reason for Consultation: Heart Failure   HPI:    Christopher Benton is seen today for evaluation of heart failure at the request of Dr Stanford Breed.   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  PYP scan 2019 Grade III suggestive of transthyretin amyloidosis. In 2019 he was started on tafamidis.  He has been followed closely in the HF clinic and was last seen on 01/30/19 by Dr Haroldine Laws. At that time he was volume overloaded so torsemide was increased to 60/40 for a few days then back to 60 mg daily. Dr Haroldine Laws discussed referral to [facility] Amyloid clinic but he wanted to hold off. Weight at that time was 230 pounds.   Earlier today he was seen by Dr Stanford Breed. Due to concern for left lower extremity cellulitis and volume he was sent for hospital admit. Prior to admit he was taking torsemide 60 mg daily.   Says he feels like his fluid is pretty good. Weight stable. Today he is at 228. Denies orthopnea or PND. Has not had fevers or chills. No trauma or sores on leg that he or his wife noted.    ECHO 12/07/2018 -EF 35-40% RV normal, LA dilated, RA severely dilated.  ECHO 03/10/2018 EF 45-50% with  suspected amyloidosis.  Grade II DD, Severe hypertrophy ECHO 2017  EF 35-40% RA/LA dilated, Ef 35-40%  Moderate hypertrophy    Stress Test at [facility] EF 41% no ischemia Review of Systems: [y] = yes, [ ]  = no   . General: Weight gain [ ] ; Weight loss [ ] ; Anorexia [ ] ; Fatigue [Y ]; Fever [ ] ; Chills [ ] ; Weakness [ ]   . Cardiac: Chest pain/pressure [ ] ; Resting SOB [ ] ; Exertional SOB [ Y]; Orthopnea [ ] ; Pedal Edema Blue.Reese ]; Palpitations [ ] ; Syncope [ ] ; Presyncope [ ] ; Paroxysmal nocturnal dyspnea[ ]   . Pulmonary: Cough [ ] ; Wheezing[ ] ; Hemoptysis[ ] ; Sputum  [ ] ; Snoring [ ]   . GI: Vomiting[ ] ; Dysphagia[ ] ; Melena[ ] ; Hematochezia [ ] ; Heartburn[ ] ; Abdominal pain [ ] ; Constipation [ ] ; Diarrhea [ ] ; BRBPR [ ]   . GU: Hematuria[ ] ; Dysuria [ ] ; Nocturia[ ]   . Vascular: Pain in legs with walking [ ] ; Pain in feet with lying flat [ ] ; Non-healing sores [ ] ; Stroke [ ] ; TIA [ ] ; Slurred speech [ ] ;  . Neuro: Headaches[ ] ; Vertigo[ ] ; Seizures[ ] ; Paresthesias[ ] ;Blurred vision [ ] ; Diplopia [ ] ; Vision changes [ ]   . Ortho/Skin: Arthritis [ y]; Joint pain [Y ]; Muscle pain [ ] ; Joint swelling [ ] ; Back Pain [Y ]; Rash [ ]   . Psych: Depression[ ] ; Anxiety[ ]   . Heme: Bleeding problems [ ] ; Clotting disorders [ ] ; Anemia [ ]   . Endocrine: Diabetes [ ] ; Thyroid dysfunction[ ]   Home Medications Prior to Admission medications   Medication Sig Start Date End Date Taking? Authorizing Provider  apixaban (ELIQUIS) 5 MG TABS tablet Take 1 tablet (5 mg total) by mouth 2 (two) times daily. 08/28/15   Lelon Perla, MD  carvedilol (COREG) 3.125 MG tablet Take 1 tablet (3.125 mg total) by mouth 2 (two) times daily with a meal. 06/07/18   Tillery, Satira Mccallum, PA-C  dofetilide (TIKOSYN) 125 MCG capsule  Take 1 capsule (125 mcg total) by mouth 2 (two) times daily. 06/07/18   Shirley Friar, PA-C  dorzolamide (TRUSOPT) 2 % ophthalmic solution Place 1 drop into both eyes 2 (two) times daily.    [provider]  latanoprost (XALATAN) 0.005 % ophthalmic solution Place 1 drop into both eyes at bedtime.    [provider]  Multiple Vitamins-Minerals (MULTIVITAMIN WITH MINERALS) tablet Take 1 tablet by mouth daily.    [provider]  potassium chloride SA (K-DUR) 20 MEQ tablet Take 2 tablets (40 mEq total) by mouth every morning AND 1 tablet (20 mEq total) every evening. 01/30/19   Kahley Leib, Shaune Pascal, MD  rosuvastatin (CRESTOR) 20 MG tablet Take 1 tablet (20 mg total) by mouth daily. 09/14/18 02/08/19  Skeet Latch, MD  Tafamidis 61 MG  CAPS Take 61 mg by mouth daily.     [provider]  torsemide (DEMADEX) 20 MG tablet Take 3 tablets (60 mg total) by mouth every morning. 01/30/19   Teagon Kron, Shaune Pascal, MD    Past Medical History: Past Medical History:  Diagnosis Date  . Arthritis    "left foot" (06/01/2018)  . Atrial fibrillation ([place])   . CAD (coronary artery disease)   . Cardiomyopathy ([place])   . Chronic systolic CHF (congestive heart failure) ([place]) 04/12/2016  . Dyspnea   . Dysrhythmia Atrial flutter  . 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 Hypertension     Past Surgical History: Past Surgical History:  Procedure Laterality Date  . CARDIOVERSION N/A 09/04/2015   Procedure: CARDIOVERSION;  Surgeon: Skeet Latch, MD;  Location: [place];  Service: Cardiovascular;  Laterality: N/A;  . IR THORACENTESIS ASP PLEURAL SPACE W/IMG GUIDE  06/02/2018  . TEE WITHOUT CARDIOVERSION N/A 09/04/2015   Procedure: TRANSESOPHAGEAL ECHOCARDIOGRAM (TEE);  Surgeon: Skeet Latch, MD;  Location: [place];  Service: Cardiovascular;  Laterality: N/A;  . TEE WITHOUT CARDIOVERSION N/A 04/15/2016   Procedure: TRANSESOPHAGEAL ECHOCARDIOGRAM (TEE);  Surgeon: Jerline Pain, MD;  Location: [facility] ENDOSCOPY;  Service: Cardiovascular;  Laterality: N/A;  . TOE AMPUTATION Right    4th digit    Family History: Family History  Problem Relation Age of Onset  . Heart disease Other        No family history    Social History: Social History   Socioeconomic History  . Marital status: Married    Spouse name: Not on file  . Number of children: 2  . Years of education: Not on file  . Highest education level: Not on file  Occupational History  . Not on file  Social Needs  . Financial resource strain: Not on file  . Food insecurity    Worry: Not on file    Inability: Not on file  . Transportation needs    Medical: Not on file    Non-medical: Not on file  Tobacco Use  . Smoking  status: Former Smoker    Packs/day: 0.12    Types: Cigarettes    Quit date: 1990    Years since quitting: 30.7  . Smokeless tobacco: Never Used  . Tobacco comment: 06/01/2018 "smoked off and on for 30 years; don't know how many years I actually smoked"  Substance and Sexual Activity  . Alcohol use: Not Currently    Comment: 06/01/2018 "nothing in the last 10 years or more"  . Drug use: Never  . Sexual activity: Not on file  Lifestyle  . Physical  activity    Days per week: Not on file    Minutes per session: Not on file  . Stress: Not on file  Relationships  . Social Herbalist on phone: Not on file    Gets together: Not on file    Attends religious service: Not on file    Active member of club or organization: Not on file    Attends meetings of clubs or organizations: Not on file    Relationship status: Not on file  Other Topics Concern  . Not on file  Social History Narrative  . Not on file    Allergies:  Allergies  Allergen Reactions  . Pradaxa [Dabigatran Etexilate Mesylate] Nausea Only  . Penicillins Rash    Objective:    Vital Signs:   Temp:  [99.2 F (37.3 C)] 99.2 F (37.3 C) (09/09 1744) Pulse Rate:  [79-83] 79 (09/09 1744) Resp:  [22] 22 (09/09 1744) BP: (110-115)/(64-88) 115/88 (09/09 1744) SpO2:  [97 %] 97 % (09/09 1744) Weight:  [103.5 kg-104.1 kg] 103.5 kg (09/09 1744)    Weight change: Filed Weights   02/08/19 1744  Weight: 103.5 kg    Intake/Output:  No intake or output data in the 24 hours ending 02/08/19 1515    Physical Exam    General:  Well appearing. No resp difficulty HEENT: normal Neck: supple. JVP 9-10 . Carotids 2+ bilat; no bruits. No lymphadenopathy or thyromegaly appreciated. Cor: PMI nondisplaced. Regular rate & rhythm. 2/6 TR Lungs: clear Abdomen: soft, nontender, nondistended. No hepatosplenomegaly. No bruits or masses. Good bowel sounds. Extremities: no cyanosis, clubbing, rash. Markedly edematous and  erythematous LLE ? Small sore on dorsum of left foot. No edema on right Neuro: alert & orientedx3, cranial nerves grossly intact. moves all 4 extremities w/o difficulty. Affect pleasant   Telemetry   NSR 70-80s Personally reviewe   Labs   Basic Metabolic Panel: No results for input(s): NA, K, CL, CO2, GLUCOSE, BUN, CREATININE, CALCIUM, MG, PHOS in the last 168 hours.  Liver Function Tests: No results for input(s): AST, ALT, ALKPHOS, BILITOT, PROT, ALBUMIN in the last 168 hours. No results for input(s): LIPASE, AMYLASE in the last 168 hours. No results for input(s): AMMONIA in the last 168 hours.  CBC: No results for input(s): WBC, NEUTROABS, HGB, HCT, MCV, PLT in the last 168 hours.  Cardiac Enzymes: No results for input(s): CKTOTAL, CKMB, CKMBINDEX, TROPONINI in the last 168 hours.  BNP: BNP (last 3 results) Recent Labs    12/08/18 1042 12/19/18 1526 01/30/19 1257  BNP 720.6* 811.3* 706.0*    ProBNP (last 3 results) No results for input(s): PROBNP in the last 8760 hours.   CBG: No results for input(s): GLUCAP in the last 168 hours.  Coagulation Studies: No results for input(s): LABPROT, INR in the last 72 hours.   Imaging    No results found.   Medications:     Current Medications:    Infusions:       Assessment/Plan    1. 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 [facility] Genetic Test + VAL 1221  ECHO 12/07/2018-EF 35-40% RV normal, LA dilated, RA severely dilated. CXR pending.  Volume status elevated. Will need to diurese with IV lasix.  Continue tafamidis 61 mg daily.   2.  LLE Cellulitis  Antibiotics Per Primary Team.   3. PAF  EKG today-if creatinine bumping or he develops hypokalemia would  favor stopping Tikosyn and switching to [facility] after a washout. He will likely not tolerate AF will with cardiac amyloidosis.  Continue tikosyn/eliquis.   4. CKD Stage III  Creatinine baseline 1.3-1.4    Length of Stay: 0  Amy Clegg, NP  02/08/2019, 3:15 PM  Advanced Heart Failure Team Pager [phone] (M-F; 7a - 4p)  Please contact [facility] for night-coverage after hours (4p -7a ) and weekends on [email or web address]   Patient seen and examined with the above-signed Advanced Practice Provider and/or Housestaff. I personally reviewed laboratory data, imaging studies and relevant notes. I independently examined the patient and formulated the important aspects of the plan. I have edited the note to reflect any of my changes or salient points. I have personally discussed the plan with the patient and/or family.  78 y/o male with PAF, CKD 3 and cardiac TTR amyloidosis admitted with LLE edema and erythema.   On exam he has severe LLE cellulitis and surrounding edema without associated wounds. No evidence of bacteremia currently.   Neck veins are mildly elevated but otherwise does not appear to have marked volume overload (his neck veins were up in clinic recently and ReDS volume was ok - suspect R > L HF)  I have wrapped the LLE personally and have called pharmacy to expedite his abx. Agree with BCX & vancomycin +/- rocephin. Would not be aggressive with IV diuresis given risk for developing sepsis and fact that overall volume does not appear too bad. LLE edema should improve with compression.   Suspect swelling in LLE due to soft tissue infection. Doubt DVT given Eliquis use. He remains in AF on Tikosyn. Watch K and mg closely. I will give one dose of K now after his IV lasix dose.   We will follow. Appreciate TRH's care.   Glori Bickers, MD  7:29 PM

## 2019-02-08 NOTE — Patient Instructions (Addendum)
Your physician recommends that you continue on your current medications as directed. Please refer to the Current Medication list given to you today.  Your physician recommends that you schedule a follow-up appointment in:  [place]

## 2019-02-08 NOTE — Plan of Care (Signed)
Direct admission discussed with Dr. Kirk Ruths. Mr. Riviello is a 78 year old male with pmh amyloidosis, systolic CHF, hypertension, PAF on Eliquis, CKD, remote hepatitis, and arthritis of the left foot; who presents with complaints of pain and swelling of the left calf.  Concerning for cellulitis and last likely DVT as patient on Eliquis.  There is also concern for possible aspect of fluid overload, but heart failure team Dr. Haroldine Laws to follow.  Accepted as inpatient to a medical telemetry bed.

## 2019-02-08 NOTE — Progress Notes (Addendum)
Pharmacy Antibiotic Note  Christopher Benton is a 78 y.o. male admitted on 02/08/2019 with left leg swelling, erythema and pain.  Pharmacy has been consulted for vancomycin dosing for cellulitis of left leg.  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, hypertension, coronary artery disease, chronic kidney disease stage III, chronic infected right great toe  Afebrile, WBC 14.3, Scr , CrCl 52 ml/min  Plan: Vancomycin 1750 mg IV X 1, followed by vancomycin 1500 mg IV Q 24 hrs (estimated vancomycin AUC on this regimen, using Scr 1.43, is 479; goal vancomycin AUC is 400-550) Monitor renal function, WBC, temp, cx, clinical improvement  Height: 5\' 11"  (180.3 cm) Weight: 228 lb 3.2 oz (103.5 kg) IBW/kg (Calculated) : 75.3  Temp (24hrs), Avg:99.2 F (37.3 C), Min:99.2 F (37.3 C), Max:99.2 F (37.3 C)  Estimated Creatinine Clearance: 59.7 mL/min (A) (by C-G formula based on SCr of 1.25 mg/dL (H)).   01/30/19  Allergies  Allergen Reactions  . Pradaxa [Dabigatran Etexilate Mesylate] Nausea Only  . Penicillins Rash    Antimicrobials this admission: 9/9 cefriaxone >>   Microbiology results: 9/9 Bld cx X 2: pending  Thank you for allowing pharmacy to be a part of this patient's care.  Gillermina Hu, PharmD, BCPS, [facility] Clinical Pharmacist 02/08/2019 7:09 PM

## 2019-02-09 DIAGNOSIS — K219 Gastro-esophageal reflux disease without esophagitis: Secondary | ICD-10-CM | POA: Diagnosis present

## 2019-02-09 DIAGNOSIS — E852 Heredofamilial amyloidosis, unspecified: Secondary | ICD-10-CM | POA: Diagnosis not present

## 2019-02-09 DIAGNOSIS — L03116 Cellulitis of left lower limb: Secondary | ICD-10-CM | POA: Diagnosis present

## 2019-02-09 DIAGNOSIS — I43 Cardiomyopathy in diseases classified elsewhere: Secondary | ICD-10-CM | POA: Diagnosis present

## 2019-02-09 DIAGNOSIS — E8589 Other amyloidosis: Secondary | ICD-10-CM | POA: Diagnosis not present

## 2019-02-09 DIAGNOSIS — I130 Hypertensive heart and chronic kidney disease with heart failure and stage 1 through stage 4 chronic kidney disease, or unspecified chronic kidney disease: Secondary | ICD-10-CM | POA: Diagnosis present

## 2019-02-09 DIAGNOSIS — N183 Chronic kidney disease, stage 3 (moderate): Secondary | ICD-10-CM | POA: Diagnosis present

## 2019-02-09 DIAGNOSIS — Z20828 Contact with and (suspected) exposure to other viral communicable diseases: Secondary | ICD-10-CM | POA: Diagnosis present

## 2019-02-09 DIAGNOSIS — E669 Obesity, unspecified: Secondary | ICD-10-CM | POA: Diagnosis present

## 2019-02-09 DIAGNOSIS — E859 Amyloidosis, unspecified: Secondary | ICD-10-CM | POA: Diagnosis not present

## 2019-02-09 DIAGNOSIS — Z87891 Personal history of nicotine dependence: Secondary | ICD-10-CM | POA: Diagnosis not present

## 2019-02-09 DIAGNOSIS — I2510 Atherosclerotic heart disease of native coronary artery without angina pectoris: Secondary | ICD-10-CM | POA: Diagnosis present

## 2019-02-09 DIAGNOSIS — I5043 Acute on chronic combined systolic (congestive) and diastolic (congestive) heart failure: Secondary | ICD-10-CM | POA: Diagnosis present

## 2019-02-09 DIAGNOSIS — Z89421 Acquired absence of other right toe(s): Secondary | ICD-10-CM | POA: Diagnosis not present

## 2019-02-09 DIAGNOSIS — E876 Hypokalemia: Secondary | ICD-10-CM | POA: Diagnosis not present

## 2019-02-09 DIAGNOSIS — Z888 Allergy status to other drugs, medicaments and biological substances status: Secondary | ICD-10-CM | POA: Diagnosis not present

## 2019-02-09 DIAGNOSIS — E854 Organ-limited amyloidosis: Secondary | ICD-10-CM | POA: Diagnosis present

## 2019-02-09 DIAGNOSIS — Z79899 Other long term (current) drug therapy: Secondary | ICD-10-CM | POA: Diagnosis not present

## 2019-02-09 DIAGNOSIS — Z7901 Long term (current) use of anticoagulants: Secondary | ICD-10-CM | POA: Diagnosis not present

## 2019-02-09 DIAGNOSIS — Z6832 Body mass index (BMI) 32.0-32.9, adult: Secondary | ICD-10-CM | POA: Diagnosis not present

## 2019-02-09 DIAGNOSIS — Z880 Allergy status to penicillin: Secondary | ICD-10-CM | POA: Diagnosis not present

## 2019-02-09 DIAGNOSIS — L03032 Cellulitis of left toe: Secondary | ICD-10-CM | POA: Diagnosis not present

## 2019-02-09 DIAGNOSIS — I480 Paroxysmal atrial fibrillation: Secondary | ICD-10-CM | POA: Diagnosis present

## 2019-02-09 DIAGNOSIS — L97519 Non-pressure chronic ulcer of other part of right foot with unspecified severity: Secondary | ICD-10-CM | POA: Diagnosis present

## 2019-02-09 LAB — MAGNESIUM: Magnesium: 2.2 mg/dL (ref 1.7–2.4)

## 2019-02-09 LAB — CBC
HCT: 49.9 % (ref 39.0–52.0)
Hemoglobin: 16.3 g/dL (ref 13.0–17.0)
MCH: 28.4 pg (ref 26.0–34.0)
MCHC: 32.7 g/dL (ref 30.0–36.0)
MCV: 86.9 fL (ref 80.0–100.0)
Platelets: 185 10*3/uL (ref 150–400)
RBC: 5.74 MIL/uL (ref 4.22–5.81)
RDW: 13.3 % (ref 11.5–15.5)
WBC: 12 10*3/uL — ABNORMAL HIGH (ref 4.0–10.5)
nRBC: 0 % (ref 0.0–0.2)

## 2019-02-09 LAB — BASIC METABOLIC PANEL
Anion gap: 11 (ref 5–15)
BUN: 25 mg/dL — ABNORMAL HIGH (ref 8–23)
CO2: 22 mmol/L (ref 22–32)
Calcium: 9.1 mg/dL (ref 8.9–10.3)
Chloride: 99 mmol/L (ref 98–111)
Creatinine, Ser: 1.36 mg/dL — ABNORMAL HIGH (ref 0.61–1.24)
GFR calc Af Amer: 57 mL/min — ABNORMAL LOW (ref >60)
GFR calc non Af Amer: 49 mL/min — ABNORMAL LOW (ref >60)
Glucose, Bld: 113 mg/dL — ABNORMAL HIGH (ref 70–99)
Potassium: 5.1 mmol/L (ref 3.5–5.1)
Sodium: 132 mmol/L — ABNORMAL LOW (ref 135–145)

## 2019-02-09 LAB — C-REACTIVE PROTEIN: CRP: 10.5 mg/dL — ABNORMAL HIGH (ref <1.0)

## 2019-02-09 LAB — SARS CORONAVIRUS 2 (TAT 6-24 HRS): SARS Coronavirus 2: NEGATIVE

## 2019-02-09 MED ORDER — PANTOPRAZOLE SODIUM 40 MG PO TBEC
40.0000 mg | DELAYED_RELEASE_TABLET | Freq: Two times a day (BID) | ORAL | Status: DC
  Administered 2019-02-09 – 2019-02-15 (×12): 40 mg via ORAL
  Filled 2019-02-09 (×13): qty 1

## 2019-02-09 MED ORDER — PANTOPRAZOLE SODIUM 40 MG PO TBEC
40.0000 mg | DELAYED_RELEASE_TABLET | Freq: Every day | ORAL | Status: DC
  Administered 2019-02-09: 40 mg via ORAL
  Filled 2019-02-09: qty 1

## 2019-02-09 NOTE — Progress Notes (Signed)
Advanced Heart Failure Rounding Note  PCP-Cardiologist: Kirk Ruths, MD   Subjective:    Admitted with severe LLE cellulitis and possible volume overload. Started on antibiotics   Denies SOB. Complaining of LLE pain. No fevers or chills   Objective:   Weight Range: 104.3 kg Body mass index is 32.08 kg/m.   Vital Signs:   Temp:  [97.5 F (36.4 C)-99.2 F (37.3 C)] 98.2 F (36.8 C) (09/10 0821) Pulse Rate:  [70-84] 81 (09/10 0821) Resp:  [18-22] 18 (09/10 0821) BP: (104-124)/(73-92) 123/82 (09/10 0821) SpO2:  [92 %-99 %] 98 % (09/10 0821) Weight:  [103.5 kg-104.3 kg] 104.3 kg (09/10 0701) Last BM Date: 02/07/19  Weight change: Filed Weights   02/08/19 1744 02/09/19 0701  Weight: 103.5 kg 104.3 kg    Intake/Output:   Intake/Output Summary (Last 24 hours) at 02/09/2019 0837 Last data filed at 02/09/2019 0824 Gross per 24 hour  Intake 1085.37 ml  Output 1050 ml  Net 35.37 ml      Physical Exam    General:  Sitting up in bed . No resp difficulty HEENT: Normal Neck: Supple. JVP 10  Carotids 2+ bilat; no bruits. No lymphadenopathy or thyromegaly appreciated. Cor: PMI nondisplaced. Regular rate & rhythm. No rubs, gallops or murmurs. Lungs: Clear Abdomen: Soft, nontender, nondistended. No hepatosplenomegaly. No bruits or masses. Good bowel sounds. Extremities: No cyanosis, clubbing, rash, severe LLE erythema. L great toe dry ulcer.  Neuro: Alert & orientedx3, cranial nerves grossly intact. moves all 4 extremities w/o difficulty. Affect pleasant   Telemetry   SR 80 Personally reviewed   EKG    SR 1st degreee   Labs    CBC Recent Labs    02/08/19 1850 02/09/19 0542  WBC 14.3* 12.0*  NEUTROABS 10.7*  --   HGB 16.6 16.3  HCT 51.7 49.9  MCV 87.5 86.9  PLT 191 323   Basic Metabolic Panel Recent Labs    02/08/19 2035 02/09/19 0542  NA 134* 132*  K 4.4 5.1  CL 101 99  CO2 22 22  GLUCOSE 115* 113*  BUN 28* 25*  CREATININE 1.49* 1.36*   CALCIUM 9.4 9.1  MG 2.2 2.2   Liver Function Tests Recent Labs    02/08/19 1850  AST 23  ALT 16  ALKPHOS 87  BILITOT 3.5*  PROT 8.0  ALBUMIN 3.9   No results for input(s): LIPASE, AMYLASE in the last 72 hours. Cardiac Enzymes No results for input(s): CKTOTAL, CKMB, CKMBINDEX, TROPONINI in the last 72 hours.  BNP: BNP (last 3 results) Recent Labs    12/08/18 1042 12/19/18 1526 01/30/19 1257  BNP 720.6* 811.3* 706.0*    ProBNP (last 3 results) No results for input(s): PROBNP in the last 8760 hours.   D-Dimer No results for input(s): DDIMER in the last 72 hours. Hemoglobin A1C No results for input(s): HGBA1C in the last 72 hours. Fasting Lipid Panel No results for input(s): CHOL, HDL, LDLCALC, TRIG, CHOLHDL, LDLDIRECT in the last 72 hours. Thyroid Function Tests No results for input(s): TSH, T4TOTAL, T3FREE, THYROIDAB in the last 72 hours.  Invalid input(s): FREET3  Other results:   Imaging    Dg Chest Port 1 View  Result Date: 02/08/2019 CLINICAL DATA:  Chronic dyspnea EXAM: PORTABLE CHEST 1 VIEW COMPARISON:  10/19/2018 FINDINGS: Small right pleural effusion, decreased. Small left pleural effusion, increased. No frank interstitial edema. No pneumothorax. Cardiomegaly.  Thoracic aortic atherosclerosis. IMPRESSION: Small bilateral pleural effusions, waxing/waning. No frank interstitial edema. Electronically  Signed   By: Julian Hy M.D.   On: 02/08/2019 18:57      Medications:     Scheduled Medications: . apixaban  5 mg Oral BID  . carvedilol  3.125 mg Oral BID WC  . dofetilide  125 mcg Oral BID  . dorzolamide  1 drop Both Eyes BID  . latanoprost  1 drop Both Eyes QHS  . rosuvastatin  20 mg Oral Daily  . Tafamidis  61 mg Oral Daily     Infusions: . sodium chloride 250 mL (02/09/19 0204)  . cefTRIAXone (ROCEPHIN)  IV    . vancomycin       PRN Medications:  sodium chloride, acetaminophen **OR** acetaminophen, ondansetron **OR**  ondansetron (ZOFRAN) IV, senna-docusate, traMADol    Assessment/Plan  1. 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 [facility] Genetic Test + VAL 1221  ECHO 12/07/2018-EF 35-40% RV normal, LA dilated, RA severely dilated. CXR pending. 2.  LLE Cellulitis  WBC 12.  Antibiotics Per Primary Team.   3. PAF  Maintaining NSR.   Remain on Tikosyn. K and Mag stable.  Per Dr Haroldine Laws if creatinine bumping or he develops hypokalemia would favor stopping Tikosyn and switching to [facility] after a washout. He will likely not tolerate AF will with cardiac amyloidosis. Continue tikosyn/eliquis.   4. CKD Stage III Creatinine baseline 1.3-1.4  Stable today.    Length of Stay: 1  Amy Clegg, NP  02/09/2019, 8:37 AM  Advanced Heart Failure Team Pager [phone] (M-F; 7a - 4p)  Please contact [facility] for night-coverage after hours (4p -7a ) and weekends on [email or web address]  Patient seen and examined with the above-signed Advanced Practice Provider and/or Housestaff. I personally reviewed laboratory data, imaging studies and relevant notes. I independently examined the patient and formulated the important aspects of the plan. I have edited the note to reflect any of my changes or salient points. I have personally discussed the plan with the patient and/or family.  Still with severe LLE cellulitis on exam WBC 14K - > 12K. No fevers or chills. On vanc/ceftriaxone.   Volume status mildly elevated with JVP to jaw. Will hold diuretics at least one more day in setting of severe cellulitis and no dyspnea.   Bcx cooking.   Maintaining NSR. Continue tikosyn. Watch electrolytes.   Continue tafamadis for TTR amyloid.   Glori Bickers, MD  9:04 AM

## 2019-02-09 NOTE — Progress Notes (Addendum)
Pharmacist reported could not getTafamidis  drug to ask family if they could bring it in so we can administer writer asked wife she stated would bring what he has [place].

## 2019-02-09 NOTE — Progress Notes (Signed)
PT reported ambulated patient with no o2 he was on room air at rest and noted o2 sat to drop to 86% it took 5 mins for him to normalize back to 96% o2 sat after resting for awhile.

## 2019-02-09 NOTE — Plan of Care (Signed)
Problem: Education: Goal: Knowledge of General Education information will improve Description: Including pain rating scale, medication(s)/side effects and non-pharmacologic comfort measures Outcome: Progressing   Problem: Health Behavior/Discharge Planning: Goal: Ability to manage health-related needs will improve Outcome: Progressing   Problem: Clinical Measurements: Goal: Ability to maintain clinical measurements within normal limits will improve Outcome: Progressing Goal: Will remain free from infection Outcome: Progressing Goal: Diagnostic test results will improve Outcome: Progressing Goal: Respiratory complications will improve Outcome: Progressing Goal: Cardiovascular complication will be avoided Outcome: Progressing   Problem: Activity: Goal: Risk for activity intolerance will decrease Outcome: Progressing   Problem: Nutrition: Goal: Adequate nutrition will be maintained Outcome: Progressing   Problem: Coping: Goal: Level of anxiety will decrease Outcome: Progressing   Problem: Elimination: Goal: Will not experience complications related to bowel motility Outcome: Progressing Goal: Will not experience complications related to urinary retention Outcome: Progressing   Problem: Pain Managment: Goal: General experience of comfort will improve Outcome: Progressing   Problem: Safety: Goal: Ability to remain free from injury will improve Outcome: Progressing   Problem: Skin Integrity: Goal: Risk for impaired skin integrity will decrease Outcome: Progressing   Problem: Education: Goal: Ability to demonstrate management of disease process will improve Outcome: Progressing Goal: Ability to verbalize understanding of medication therapies will improve Outcome: Progressing Goal: Individualized Educational Video(s) Outcome: Progressing   Problem: Activity: Goal: Capacity to carry out activities will improve Outcome: Progressing   Problem: Cardiac: Goal:  Ability to achieve and maintain adequate cardiopulmonary perfusion will improve Outcome: Progressing   Problem: Education: Goal: Knowledge of General Education information will improve Description: Including pain rating scale, medication(s)/side effects and non-pharmacologic comfort measures 02/09/2019 2019 by Arlina Robes, RN Outcome: Progressing 02/09/2019 2018 by Arlina Robes, RN Outcome: Progressing   Problem: Health Behavior/Discharge Planning: Goal: Ability to manage health-related needs will improve 02/09/2019 2019 by Arlina Robes, RN Outcome: Progressing 02/09/2019 2018 by Arlina Robes, RN Outcome: Progressing   Problem: Clinical Measurements: Goal: Ability to maintain clinical measurements within normal limits will improve 02/09/2019 2019 by Arlina Robes, RN Outcome: Progressing 02/09/2019 2018 by Arlina Robes, RN Outcome: Progressing Goal: Will remain free from infection 02/09/2019 2019 by Arlina Robes, RN Outcome: Progressing 02/09/2019 2018 by Arlina Robes, RN Outcome: Progressing Goal: Diagnostic test results will improve 02/09/2019 2019 by Arlina Robes, RN Outcome: Progressing 02/09/2019 2018 by Arlina Robes, RN Outcome: Progressing Goal: Respiratory complications will improve 02/09/2019 2019 by Arlina Robes, RN Outcome: Progressing 02/09/2019 2018 by Arlina Robes, RN Outcome: Progressing Goal: Cardiovascular complication will be avoided 02/09/2019 2019 by Arlina Robes, RN Outcome: Progressing 02/09/2019 2018 by Arlina Robes, RN Outcome: Progressing   Problem: Activity: Goal: Risk for activity intolerance will decrease 02/09/2019 2019 by Arlina Robes, RN Outcome: Progressing 02/09/2019 2018 by Arlina Robes, RN Outcome: Progressing   Problem: Nutrition: Goal: Adequate nutrition will be maintained 02/09/2019 2019 by Arlina Robes, RN Outcome: Progressing 02/09/2019  2018 by Arlina Robes, RN Outcome: Progressing   Problem: Coping: Goal: Level of anxiety will decrease 02/09/2019 2019 by Arlina Robes, RN Outcome: Progressing 02/09/2019 2018 by Arlina Robes, RN Outcome: Progressing   Problem: Elimination: Goal: Will not experience complications related to bowel motility 02/09/2019 2019 by Arlina Robes, RN Outcome: Progressing 02/09/2019 2018 by Arlina Robes, RN Outcome: Progressing Goal: Will not experience complications related to urinary retention 02/09/2019 2019 by Arlina Robes, RN Outcome: Progressing 02/09/2019 2018  by Arlina Robes, RN Outcome: Progressing   Problem: Pain Managment: Goal: General experience of comfort will improve 02/09/2019 2019 by Arlina Robes, RN Outcome: Progressing 02/09/2019 2018 by Arlina Robes, RN Outcome: Progressing   Problem: Safety: Goal: Ability to remain free from injury will improve 02/09/2019 2019 by Arlina Robes, RN Outcome: Progressing 02/09/2019 2018 by Arlina Robes, RN Outcome: Progressing   Problem: Skin Integrity: Goal: Risk for impaired skin integrity will decrease 02/09/2019 2019 by Arlina Robes, RN Outcome: Progressing 02/09/2019 2018 by Arlina Robes, RN Outcome: Progressing   Problem: Education: Goal: Ability to demonstrate management of disease process will improve 02/09/2019 2019 by Arlina Robes, RN Outcome: Progressing 02/09/2019 2018 by Arlina Robes, RN Outcome: Progressing Goal: Ability to verbalize understanding of medication therapies will improve 02/09/2019 2019 by Arlina Robes, RN Outcome: Progressing 02/09/2019 2018 by Arlina Robes, RN Outcome: Progressing Goal: Individualized Educational Video(s) 02/09/2019 2019 by Arlina Robes, RN Outcome: Progressing 02/09/2019 2018 by Arlina Robes, RN Outcome: Progressing   Problem: Activity: Goal: Capacity to carry out activities  will improve 02/09/2019 2019 by Arlina Robes, RN Outcome: Progressing 02/09/2019 2018 by Arlina Robes, RN Outcome: Progressing   Problem: Cardiac: Goal: Ability to achieve and maintain adequate cardiopulmonary perfusion will improve 02/09/2019 2019 by Arlina Robes, RN Outcome: Progressing 02/09/2019 2018 by Arlina Robes, RN Outcome: Progressing

## 2019-02-09 NOTE — Progress Notes (Addendum)
Progress Note    Christopher Benton  XBM:[id number] DOB: Dec 22, 1940  DOA: 02/08/2019 PCP: [name], MD    Brief Narrative:     Medical records reviewed and are as summarized below:  Christopher Benton is an 78 y.o. 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 [name] office with complaints of left leg swelling, redness and pain.  Patient is a poor historian and wife present at bedside mostly provides history.  Patient started having increasing redness and pain in the left leg for the last 2 to 3 days.  He also has been having increasing swelling of the left lower extremity for the last few days.  Assessment/Plan:   Principal Problem:   Cellulitis Active Problems:   Essential hypertension   PAF (paroxysmal atrial fibrillation) (HCC)   CAD (coronary artery disease)   Amyloidosis (HCC)   Acute on chronic combined systolic and diastolic CHF (congestive heart failure) (HCC)   CKD (chronic kidney disease), stage III (HCC)  left lower extremity cellulitis -patient on Rocephin and vancomycin.   -Blood cultures NGTD -it has been reported that the patient had a lower extremity duplex done recently somewhere else and there was nothing abnormal.  No official report in the system to review. -WBCs are trending down  Acute on chronic combined systolic and diastolic heart failure -Echo in July 2020 had shown EF of 35-40 % with biatrial enlargement  -CHF team: Volume status mildly elevated with JVP to jaw. Will hold diuretics at least one more day in setting of severe cellulitis and no dyspnea.   Amyloidosis -Continue tafamidis.  Outpatient follow-up  Paroxysmal A. Fib -Currently in sinus rhythm.   -per cards:  if creatinine bumping or he develops  hypokalemia would favor stopping Tikosyn and switching to amio after a washout. He will likely not tolerate AF will with cardiac amyloidosis. Continue tikosyn/eliquis.   Hypertension  -Monitor blood pressure  Chronic renal disease stage III -daily labs- monitor closely in light of vancomycin usage  Coronary artery disease -Diagnosed previously based on CT scan showing coronary calcification.  Continue statin.  No aspirin as patient is on Eliquis.  Chronic right great toe ulcer: Present on admission -woc: Cleanse right great toe with NS and pat dry.  Apply Xeroform gauze to wound bed.  Cover with dry gauze and tape. Change daily.   GERD -increase PPI to BID for now  obesity Body mass index is 32.08 kg/m.   Family Communication/Anticipated D/C date and plan/Code Status   DVT prophylaxis: eliquis Code Status: Full Code.  Family Communication: none at bedside Disposition Plan: patient needs to remain in hospital for IV abx for severe cellulitis and therefore I will turn to inpatient as he is crossing 2 midnights   Medical Consultants:    [facility]  cardiology   Subjective:   " I just want to feel better" "I cough up mucous after I eat"  Objective:    Vitals:   02/09/19 0700 02/09/19 0701 02/09/19 0821 02/09/19 1207  BP: (!) 124/92  123/82 100/85  Pulse: 84  81 79  Resp: 20  18 18   Temp: (!) 97.5 F (36.4 C)  98.2 F (36.8 C) 97.9 F (36.6 C)  TempSrc: Oral  Oral Oral  SpO2:   98% 91%  Weight:  104.3 kg    Height:  Intake/Output Summary (Last 24 hours) at 02/09/2019 1222 Last data filed at 02/09/2019 0824 Gross per 24 hour  Intake 1085.37 ml  Output 1050 ml  Net 35.37 ml   Filed Weights   02/08/19 1744 02/09/19 0701  Weight: 103.5 kg 104.3 kg    Exam: In bed, NAD Pleasant and cooperative rrr No increased work of breathing Left lower extremity redness up to mid shin, dry ulcer on great toe   Data Reviewed:   I have personally reviewed  following labs and imaging studies:  Labs: Labs show the following:   Basic Metabolic Panel: Recent Labs  Lab 02/08/19 1850 02/08/19 2035 02/09/19 0542  NA 134* 134* 132*  K 4.6 4.4 5.1  CL 99 101 99  CO2 24 22 22   GLUCOSE 100* 115* 113*  BUN 27* 28* 25*  CREATININE 1.43* 1.49* 1.36*  CALCIUM 9.5 9.4 9.1  MG 2.3 2.2 2.2   GFR Estimated Creatinine Clearance: 55 mL/min (A) (by C-G formula based on SCr of 1.36 mg/dL (H)). Liver Function Tests: Recent Labs  Lab 02/08/19 1850  AST 23  ALT 16  ALKPHOS 87  BILITOT 3.5*  PROT 8.0  ALBUMIN 3.9   No results for input(s): LIPASE, AMYLASE in the last 168 hours. No results for input(s): AMMONIA in the last 168 hours. Coagulation profile No results for input(s): INR, PROTIME in the last 168 hours.  CBC: Recent Labs  Lab 02/08/19 1850 02/09/19 0542  WBC 14.3* 12.0*  NEUTROABS 10.7*  --   HGB 16.6 16.3  HCT 51.7 49.9  MCV 87.5 86.9  PLT 191 185   Cardiac Enzymes: No results for input(s): CKTOTAL, CKMB, CKMBINDEX, TROPONINI in the last 168 hours. BNP (last 3 results) No results for input(s): PROBNP in the last 8760 hours. CBG: No results for input(s): GLUCAP in the last 168 hours. D-Dimer: No results for input(s): DDIMER in the last 72 hours. Hgb A1c: No results for input(s): HGBA1C in the last 72 hours. Lipid Profile: No results for input(s): CHOL, HDL, LDLCALC, TRIG, CHOLHDL, LDLDIRECT in the last 72 hours. Thyroid function studies: No results for input(s): TSH, T4TOTAL, T3FREE, THYROIDAB in the last 72 hours.  Invalid input(s): FREET3 Anemia work up: No results for input(s): VITAMINB12, FOLATE, FERRITIN, TIBC, IRON, RETICCTPCT in the last 72 hours. Sepsis Labs: Recent Labs  Lab 02/08/19 1850 02/09/19 0542  WBC 14.3* 12.0*    Microbiology Recent Results (from the past 240 hour(s))  SARS CORONAVIRUS 2 (TAT 6-24 HRS) Nasopharyngeal Nasopharyngeal Swab     Status: None   Collection Time: 02/08/19 12:02 AM    Specimen: Nasopharyngeal Swab  Result Value Ref Range Status   SARS Coronavirus 2 NEGATIVE NEGATIVE Final    Comment: (NOTE) SARS-CoV-2 target nucleic acids are NOT DETECTED. The SARS-CoV-2 RNA is generally detectable in upper and lower respiratory specimens during the acute phase of infection. Negative results do not preclude SARS-CoV-2 infection, do not rule out co-infections with other pathogens, and should not be used as the sole basis for treatment or other patient management decisions. Negative results must be combined with clinical observations, patient history, and epidemiological information. The expected result is Negative. Fact Sheet for Patients: SugarRoll.be Fact Sheet for Healthcare Providers: https://www.woods-mathews.com/ This test is not yet approved or cleared by the [place] FDA and  has been authorized for detection and/or diagnosis of SARS-CoV-2 by FDA under an Emergency Use Authorization (EUA). This EUA will remain  in effect (meaning this test can be used) for the  duration of the COVID-19 declaration under Section 56 4(b)(1) of the Act, 21 U.S.C. section 360bbb-3(b)(1), unless the authorization is terminated or revoked sooner. Performed at [facility], [place] [address], [place], [place] [address]     Procedures and diagnostic studies:  Dg Chest Port 1 View  Result Date: 02/08/2019 CLINICAL DATA:  Chronic dyspnea EXAM: PORTABLE CHEST 1 VIEW COMPARISON:  10/19/2018 FINDINGS: Small right pleural effusion, decreased. Small left pleural effusion, increased. No frank interstitial edema. No pneumothorax. Cardiomegaly.  Thoracic aortic atherosclerosis. IMPRESSION: Small bilateral pleural effusions, waxing/waning. No frank interstitial edema. Electronically Signed   By: Julian Hy M.D.   On: 02/08/2019 18:57    Medications:   . apixaban  5 mg Oral BID  . carvedilol  3.125 mg Oral BID WC  . dofetilide   125 mcg Oral BID  . dorzolamide  1 drop Both Eyes BID  . latanoprost  1 drop Both Eyes QHS  . pantoprazole  40 mg Oral BID  . rosuvastatin  20 mg Oral Daily  . Tafamidis  61 mg Oral Daily   Continuous Infusions: . sodium chloride 250 mL (02/09/19 0204)  . cefTRIAXone (ROCEPHIN)  IV    . vancomycin       LOS: 1 day   Geradine Girt  [facility]   How to contact the [facility] Attending or Consulting provider [place] or covering provider during after hours [place], for this patient?  1. Check the care team in [facility] and look for a) attending/consulting [facility] provider listed and b) the [facility] team listed 2. Log into [email or web address] and use [place] universal password to access. If you do not have the password, please contact the hospital operator. 3. Locate the [facility] provider you are looking for under [facility] and page to a number that you can be directly reached. 4. If you still have difficulty reaching the provider, please page the [facility] (Director on Call) for the Hospitalists listed on amion for assistance.  02/09/2019, 12:22 PM

## 2019-02-09 NOTE — Progress Notes (Signed)
Wife gave patient his Tafamidis from  [place] in to give meds she told me patient already received this morning. Writer informed pharmacy they reported unable to get this med ask wife if she could bring in from home so patient can continue to get this drug.

## 2019-02-09 NOTE — Evaluation (Signed)
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 [name] office with complaints of left leg swelling, redness and pain.   Work up for cellulitis and acute combined HF.  Clinical Impression  Pt admitted with/for cellulitis L leg.Marland Kitchen  Pt currently limited functionally due to the problems listed below.  (see problems list.)  Pt will benefit from PT to maximize function and safety to be able to get home safely with available assist.     Follow Up Recommendations No PT follow up;Supervision/Assistance - 24 hour;Supervision - Intermittent    Equipment Recommendations  Other (comment)(TBA)    Recommendations for Other Services       Precautions / Restrictions Precautions Precautions: Fall;Other (comment)(lower)      Mobility  Bed Mobility Overal bed mobility: Modified Independent             General bed mobility comments: slower, but no assist  Transfers Overall transfer level: Needs assistance   Transfers: Sit to/from Stand Sit to Stand: Supervision            Ambulation/Gait Ambulation/Gait assistance: Supervision Gait Distance (Feet): 250 Feet Assistive device: None Gait Pattern/deviations: Step-through pattern Gait velocity: moderate Gait velocity interpretation: >2.62 ft/sec, indicative of community ambulatory General Gait Details: generally steady with wider BOS.  Sats on RA with mobility dropped to 86/87%, read standing at half way point.  Once back to bed, sats on RA read between 85 and 88% with 4-5 minutes time to return to 90/91% on RA  Stairs            Wheelchair  Mobility    Modified Rankin (Stroke Patients Only)       Balance Overall balance assessment: Mild deficits observed, not formally tested                                           Pertinent Vitals/Pain Pain Assessment: 0-10 Pain Score: 7  Pain Location: l leg Pain Descriptors / Indicators: Sore Pain Intervention(s): Monitored during session;Premedicated before session    Home Living Family/patient expects to be discharged to:: Private residence Living Arrangements: Spouse/significant other(dtr) Available Help at Discharge: Family;Available 24 hours/day Type of Home: House Home Access: Stairs to enter     Home Layout: One level Home Equipment: Crutches      Prior Function Level of Independence: Independent               Hand Dominance        Extremity/Trunk Assessment   Upper Extremity Assessment Upper Extremity Assessment: Overall WFL for tasks assessed    Lower Extremity Assessment Lower Extremity Assessment: Overall WFL for tasks assessed;RLE deficits/detail;LLE deficits/detail RLE Deficits / Details: tight from swelling, generally WFL strength LLE Deficits / Details: tight from edema, redness from cellulitis, mildly weaker than right LE proximally.       Communication   Communication: No difficulties  Cognition Arousal/Alertness: Awake/alert Behavior During Therapy: WFL for tasks assessed/performed Overall Cognitive Status: (NT formally)  General Comments      Exercises     Assessment/Plan    PT Assessment Patient needs continued PT services  PT Problem List Decreased activity tolerance;Decreased mobility;Cardiopulmonary status limiting activity;Decreased knowledge of use of DME       PT Treatment Interventions Gait training;Stair training;Functional mobility training;DME instruction;Therapeutic activities;Patient/family education    PT Goals (Current goals can be  found in the Care Plan section)  Acute Rehab PT Goals Patient Stated Goal: problems addressed before going home. PT Goal Formulation: With patient/family Time For Goal Achievement: 02/23/19 Potential to Achieve Goals: Good    Frequency Min 3X/week   Barriers to discharge        Co-evaluation               AM-PAC PT "6 Clicks" Mobility  Outcome Measure Help needed turning from your back to your side while in a flat bed without using bedrails?: None Help needed moving from lying on your back to sitting on the side of a flat bed without using bedrails?: None Help needed moving to and from a bed to a chair (including a wheelchair)?: None Help needed standing up from a chair using your arms (e.g., wheelchair or bedside chair)?: None Help needed to walk in hospital room?: None Help needed climbing 3-5 steps with a railing? : None 6 Click Score: 24    End of Session   Activity Tolerance: Patient tolerated treatment well;Other (comment)(limited by low SpO2) Patient left: in bed;with call bell/phone within reach;with family/visitor present Nurse Communication: Mobility status PT Visit Diagnosis: Other abnormalities of gait and mobility (R26.89)    Time: 5974-7185 PT Time Calculation (min) (ACUTE ONLY): 36 min   Charges:   PT Evaluation $PT Eval Moderate Complexity: 1 Mod PT Treatments $Gait Training: 8-22 mins        02/09/2019  Donnella Sham, PT Acute Rehabilitation Services [phone]  (pager) [phone]  (office)  Tessie Fass Torryn Hudspeth 02/09/2019, 6:30 PM

## 2019-02-09 NOTE — Consult Note (Signed)
[facility] Nurse wound consult note Reason for Consult: Right great toe.  Resolving blood filled bulla that was unroofed in the office 11/2018.  Will provide topical orders to promote healing.  Left leg cellulitis being managed by medical team with vancomycin.  Wound type: inflammatory Pressure Injury POA: NA Measurement: 2 cm unroofed bulla to right great toe.  Wound VQQ:VZDGL red Drainage (amount, consistency, odor) minimal serosanguinous  No odor.  Periwound:erythema,edema Dressing procedure/placement/frequency: Cleanse right great toe with NS and pat dry.  Apply Xeroform gauze to wound bed.  Cover with dry gauze and tape. Change daily.  Will not follow at this time.  Please re-consult if needed.  Domenic Moras MSN, RN, FNP-BC CWON Wound, Ostomy, Continence Nurse Pager [phone]

## 2019-02-09 NOTE — Discharge Instructions (Signed)

## 2019-02-10 DIAGNOSIS — I5043 Acute on chronic combined systolic (congestive) and diastolic (congestive) heart failure: Secondary | ICD-10-CM

## 2019-02-10 LAB — BASIC METABOLIC PANEL
Anion gap: 11 (ref 5–15)
BUN: 24 mg/dL — ABNORMAL HIGH (ref 8–23)
CO2: 22 mmol/L (ref 22–32)
Calcium: 9.3 mg/dL (ref 8.9–10.3)
Chloride: 100 mmol/L (ref 98–111)
Creatinine, Ser: 1.25 mg/dL — ABNORMAL HIGH (ref 0.61–1.24)
GFR calc Af Amer: >60 mL/min (ref >60)
GFR calc non Af Amer: 55 mL/min — ABNORMAL LOW (ref >60)
Glucose, Bld: 125 mg/dL — ABNORMAL HIGH (ref 70–99)
Potassium: 4.7 mmol/L (ref 3.5–5.1)
Sodium: 133 mmol/L — ABNORMAL LOW (ref 135–145)

## 2019-02-10 MED ORDER — FUROSEMIDE 10 MG/ML IJ SOLN
80.0000 mg | Freq: Two times a day (BID) | INTRAMUSCULAR | Status: DC
  Administered 2019-02-10 – 2019-02-12 (×6): 80 mg via INTRAVENOUS
  Filled 2019-02-10 (×7): qty 8

## 2019-02-10 NOTE — Progress Notes (Signed)
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 [name] office with complaints of left leg swelling, redness and pain.   Work up for cellulitis and acute combined HF.    PT Comments    Pt standing on arrival without sock on left foot, brushing teeth with water running onto mask. Pt initially very slow to process and follow commands stating he just got up and that he normally sleeps during the day. Pt with SpO2 94% on RA at rest with drop to 86% during gait without significant change of 87% with 2L during gait. Attempted multiple reading and fingers with 87-89% consistently independent of RA or 2L during gait. Pt educated for pulse ox for home, pursed lip breathing and continued need to monitor O2 for accurate measure.  HR 79-86 with activity    Follow Up Recommendations  Supervision - Intermittent;No PT follow up     Equipment Recommendations  None recommended by PT    Recommendations for Other Services       Precautions / Restrictions Precautions Precautions: Fall Precaution Comments: watch sats    Mobility  Bed Mobility               General bed mobility comments: standing on arrival  Transfers Overall transfer level: Modified independent                  Ambulation/Gait Ambulation/Gait assistance: Supervision Gait Distance (Feet): 600 Feet Assistive device: Crutches Gait Pattern/deviations: Step-through pattern   Gait velocity interpretation: >2.62 ft/sec, indicative of community ambulatory General Gait Details: pt with bil crutches actually contacting ground with them  for first 100' then carrying them the rest of the time. pt with SpO2 86% on RA with gait with it only reading 87% on 2L with gait with variation to 91%. Pt with steady gait with and without crutch use   Stairs Stairs: Yes Stairs assistance: Modified independent (Device/Increase time) Stair Management: One rail Right;With crutches;Forwards Number of Stairs: 11 General stair comments: pt ascending with right rail and crutches in left hand   Wheelchair Mobility    Modified Rankin (Stroke Patients Only)       Balance Overall balance assessment: Mild deficits observed, not formally tested                                          Cognition Arousal/Alertness: Awake/alert Behavior During Therapy: WFL for tasks assessed/performed Overall Cognitive Status: Impaired/Different from baseline Area of Impairment: Memory                     Memory: Decreased short-term memory         General Comments: pt repeatedly stating "I just woke up" when asked about PLOF and current function with increased time to follow commands      Exercises      General Comments        Pertinent Vitals/Pain Pain Score: 4  Pain Location: LLE Pain Descriptors / Indicators: Aching;Sore Pain Intervention(s): Limited activity within patient's tolerance;Repositioned;Monitored during session    Home Living  Prior Function            PT Goals (current goals can now be found in the care plan section) Progress towards PT goals: Progressing toward goals    Frequency           PT Plan Current plan remains appropriate    Co-evaluation              AM-PAC PT "6 Clicks" Mobility   Outcome Measure  Help needed turning from your back to your side while in a flat bed without using bedrails?: None Help needed moving from lying on your back to sitting on the side of a flat bed without using bedrails?: None Help needed moving to and from a  bed to a chair (including a wheelchair)?: None Help needed standing up from a chair using your arms (e.g., wheelchair or bedside chair)?: None Help needed to walk in hospital room?: A Little Help needed climbing 3-5 steps with a railing? : None 6 Click Score: 23    End of Session Equipment Utilized During Treatment: Oxygen;Gait belt Activity Tolerance: Patient tolerated treatment well Patient left: in chair;with call bell/phone within reach Nurse Communication: Mobility status PT Visit Diagnosis: Other abnormalities of gait and mobility (R26.89)     Time: 7564-3329 PT Time Calculation (min) (ACUTE ONLY): 37 min  Charges:  $Gait Training: 23-37 mins                     [place] Pager: [phone] Office: [phone]    Hollis Tuller B Tonya Wantz 02/10/2019, 1:52 PM

## 2019-02-10 NOTE — Progress Notes (Signed)
Wound dressing changed by pt's spouse per pt and wife request.

## 2019-02-10 NOTE — Progress Notes (Addendum)
Advanced Heart Failure Rounding Note  PCP-Cardiologist: Kirk Ruths, MD   Subjective:    Admitted with severe LLE cellulitis and possible volume overload. Started on antibiotics.   Denies SOB. Says left leg feeling better.    Objective:   Weight Range: 103.2 kg Body mass index is 31.73 kg/m.   Vital Signs:   Temp:  [97.6 F (36.4 C)-97.9 F (36.6 C)] 97.7 F (36.5 C) (09/11 0442) Pulse Rate:  [61-82] 76 (09/11 0442) Resp:  [16-18] 18 (09/11 0442) BP: (95-108)/(75-93) 106/93 (09/11 0442) SpO2:  [90 %-95 %] 95 % (09/11 0442) Weight:  [103.2 kg] 103.2 kg (09/11 0048) Last BM Date: 02/06/19  Weight change: Filed Weights   02/08/19 1744 02/09/19 0701 02/10/19 0048  Weight: 103.5 kg 104.3 kg 103.2 kg    Intake/Output:   Intake/Output Summary (Last 24 hours) at 02/10/2019 0842 Last data filed at 02/10/2019 0821 Gross per 24 hour  Intake 1390.8 ml  Output 725 ml  Net 665.8 ml      Physical Exam    General:   No resp difficulty HEENT: normal Neck: supple. JVP to jaw. Carotids 2+ bilat; no bruits. No lymphadenopathy or thryomegaly appreciated. Cor: PMI nondisplaced. Regular rate & rhythm. No rubs, gallops or murmurs. Lungs: clear Abdomen: soft, nontender, nondistended. No hepatosplenomegaly. No bruits or masses. Good bowel sounds. Extremities: no cyanosis, clubbing, rash, LLE erythema  R great toe dry ulcer.  Neuro: alert & orientedx3, cranial nerves grossly intact. moves all 4 extremities w/o difficulty. Affect pleasant   Telemetry   SR 80 Personally reviewed   EKG    SR 1st degreee   Labs    CBC Recent Labs    02/08/19 1850 02/09/19 0542  WBC 14.3* 12.0*  NEUTROABS 10.7*  --   HGB 16.6 16.3  HCT 51.7 49.9  MCV 87.5 86.9  PLT 191 161   Basic Metabolic Panel Recent Labs    02/08/19 2035 02/09/19 0542 02/10/19 0120  NA 134* 132* 133*  K 4.4 5.1 4.7  CL 101 99 100  CO2 22 22 22   GLUCOSE 115* 113* 125*  BUN 28* 25* 24*  CREATININE  1.49* 1.36* 1.25*  CALCIUM 9.4 9.1 9.3  MG 2.2 2.2  --    Liver Function Tests Recent Labs    02/08/19 1850  AST 23  ALT 16  ALKPHOS 87  BILITOT 3.5*  PROT 8.0  ALBUMIN 3.9   No results for input(s): LIPASE, AMYLASE in the last 72 hours. Cardiac Enzymes No results for input(s): CKTOTAL, CKMB, CKMBINDEX, TROPONINI in the last 72 hours.  BNP: BNP (last 3 results) Recent Labs    12/08/18 1042 12/19/18 1526 01/30/19 1257  BNP 720.6* 811.3* 706.0*    ProBNP (last 3 results) No results for input(s): PROBNP in the last 8760 hours.   D-Dimer No results for input(s): DDIMER in the last 72 hours. Hemoglobin A1C No results for input(s): HGBA1C in the last 72 hours. Fasting Lipid Panel No results for input(s): CHOL, HDL, LDLCALC, TRIG, CHOLHDL, LDLDIRECT in the last 72 hours. Thyroid Function Tests No results for input(s): TSH, T4TOTAL, T3FREE, THYROIDAB in the last 72 hours.  Invalid input(s): FREET3  Other results:   Imaging    No results found.   Medications:     Scheduled Medications: . apixaban  5 mg Oral BID  . carvedilol  3.125 mg Oral BID WC  . dofetilide  125 mcg Oral BID  . dorzolamide  1 drop Both Eyes BID  .  latanoprost  1 drop Both Eyes QHS  . pantoprazole  40 mg Oral BID  . rosuvastatin  20 mg Oral Daily  . Tafamidis  61 mg Oral Daily    Infusions: . sodium chloride 250 mL (02/10/19 0015)  . cefTRIAXone (ROCEPHIN)  IV 1 g (02/10/19 0020)  . vancomycin 1,500 mg (02/09/19 2042)    PRN Medications: sodium chloride, acetaminophen **OR** acetaminophen, ondansetron **OR** ondansetron (ZOFRAN) IV, senna-docusate, traMADol    Assessment/Plan  1. 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 [facility] Genetic Test + VAL 1221  ECHO 12/07/2018-EF 35-40% RV normal, LA dilated, RA severely dilated. Volume status trending up. Start lasix 80 mg IV twice a day. Renal function stable.   2.   LLE Cellulitis  Antibiotics Per Primary Team.   3. PAF  Maintain NSR.   Remain on Tikosyn. K and Mag stable.  Per Dr Haroldine Laws if creatinine bumping or he develops hypokalemia would favor stopping Tikosyn and switching to [facility] after a washout. He will likely not tolerate AF will with cardiac amyloidosis. Continue tikosyn/eliquis.   4. CKD Stage III Creatinine baseline 1.3-1.4  Stable. BMEt in am    Length of Stay: 2  Darrick Grinder, NP  02/10/2019, 8:42 AM  Advanced Heart Failure Team Pager [phone] (M-F; 7a - 4p)  Please contact [facility] for night-coverage after hours (4p -7a ) and weekends on [email or web address]  Patient seen and examined with the above-signed Advanced Practice Provider and/or Housestaff. I personally reviewed laboratory data, imaging studies and relevant notes. I independently examined the patient and formulated the important aspects of the plan. I have edited the note to reflect any of my changes or salient points. I have personally discussed the plan with the patient and/or family.  Cellulitis improving. BCx negative. Continue IV abx. We rewrapped leg today. Volume elevated. Will give IV lasix.   I updated his wife by phone.   ECG with NSR. Low volts 1AVB with occasional blocked PACs. QTC ok.   Personally reviewed   Glori Bickers, MD  11:29 AM

## 2019-02-10 NOTE — Progress Notes (Signed)
PROGRESS NOTE  Christopher Benton BWI:[id number] DOB: 1940/08/06 DOA: 02/08/2019 PCP: [name], MD  Brief History   Christopher Benton is an 78 y.o. 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 [name] office with complaints of left leg swelling, redness and pain. Patient is a poor historian and wife present at bedside mostly provides history. Patient started having increasing redness and pain in the left leg for the last 2 to 3 days. He also has been having increasing swelling of the left lower extremity for the last few days.  The patient has been admitted to a telemetry bed. Cardiology has been consulted and he is being diuresed with Lasix 80 mg IV bid. He is receiving IV Ceftriaxone and Vancomycin for his cellulitis. Blood cultures x 2 have been obtained, but have had no growth. Wound care has been consulted to care for the blood filled bulla on his right great toe. Right lower extremity is wrapped.  Consultants  . Heart Failure . Wound Care consult  Procedures  . None  Antibiotics   Anti-infectives (From admission, onward)   Start     Dose/Rate Route Frequency Ordered Stop   02/09/19 2200  cefTRIAXone (ROCEPHIN) 1 g in sodium chloride 0.9 % 100 mL IVPB     1 g 200 mL/hr over 30 Minutes Intravenous Every 24 hours 02/08/19 1814     02/09/19 2030  vancomycin (VANCOCIN) 1,500 mg in sodium chloride 0.9 % 500 mL IVPB     1,500 mg 250 mL/hr over 120 Minutes Intravenous Every 24 hours 02/08/19 2029     02/08/19 1930  cefTRIAXone (ROCEPHIN) 1 g in sodium chloride 0.9 % 100 mL IVPB     1 g 200 mL/hr over 30 Minutes Intravenous STAT 02/08/19 1921 02/09/19 0236   02/08/19 1930  vancomycin (VANCOCIN) 1,750 mg in sodium chloride 0.9 % 500 mL IVPB     1,750 mg  250 mL/hr over 120 Minutes Intravenous  Once 02/08/19 1922 02/08/19 2327    .  Subjective  The patient is resting comfortably. No new complaints.  Objective   Vitals:  Vitals:   02/10/19 0857 02/10/19 0859  BP: (!) 114/92 (!) 114/92  Pulse: 89 88  Resp:  15  Temp:  (!) 97.5 F (36.4 C)  SpO2:  91%    Exam:  Constitutional:  . The patient is awake, alert, and oriented x 3. No acute distress. Respiratory:  . There is no increased work of breathing. . No wheezes, rales, or rhonchi. . No tactile fremitus. Cardiovascular:  . Regular rate and rhythm. . No murmurs, ectopy, or gallups  . No lateral PMI. No thrills. Abdomen:  . Abdomen is soft, non-tender, non-distended. . No hernias, masses, or organomegaly . Normoactive bowel sounds. Musculoskeletal:  . Right lower extremity is wrapped in an ace bandage. Remainder of lower extremity is edematous and erythematous. Skin:  . There is a blister on his right great toe. Neurologic:  . CN 2-12 intact . Sensation all 4 extremities intact Psychiatric:  . Mental status o Mood, affect appropriate o Orientation to person, place, time  . judgment and insight appear intact    I have personally reviewed the following:   Today's Data  . Vitals, BMP  Micro Data  . Blood Cultures.  Scheduled Meds: . apixaban  5 mg Oral BID  .  carvedilol  3.125 mg Oral BID WC  . dofetilide  125 mcg Oral BID  . dorzolamide  1 drop Both Eyes BID  . furosemide  80 mg Intravenous BID  . latanoprost  1 drop Both Eyes QHS  . pantoprazole  40 mg Oral BID  . rosuvastatin  20 mg Oral Daily  . Tafamidis  61 mg Oral Daily   Continuous Infusions: . sodium chloride 250 mL (02/10/19 0015)  . cefTRIAXone (ROCEPHIN)  IV 1 g (02/10/19 0020)  . vancomycin 1,500 mg (02/09/19 2042)    Principal Problem:   Cellulitis Active Problems:   Essential hypertension   PAF (paroxysmal atrial fibrillation) (HCC)   CAD (coronary artery disease)   Amyloidosis  (HCC)   Acute on chronic combined systolic and diastolic CHF (congestive heart failure) (HCC)   CKD (chronic kidney disease), stage III ([place])   LOS: 2 days   A & P  Left lower extremity cellulitis: The patient has received Rocephin and Vancomycin. Blood cultures have had no growth to date. WBC's trending down. It has been reported that the patient had a lower extremity duplex done recently somewhere else and there was nothing abnormal. No official report in the system to review.  Acute on chronic combined systolic and diastolic heart failure: Echo in July 2020 had shownEF of 35-40 % with biatrial enlargement. CHF team: Lasix 80 mg IV daily has been restarted.   Amyloidosis: Continue tafamidis. Outpatient follow-up  Paroxysmal A. Fib: Currently in sinus rhythm. management per cardiology. If creatinine bumping or he develops hypokalemia would favor stopping Tikosyn and switching to [facility] after a washout. He will likely not tolerate AF will with cardiac amyloidosis.The patient will be continued on Tikosyn and Eliquis.  Hypertension: Monitor blood pressure  Chronic renal disease stage III: Avoid nephrotoxic substances and hypotension. Monitor creatinine, electrolytes, and volume status.  Coronary artery disease: Diagnosed previously based on CT scan showing coronary calcification. Continue statin. No aspirin as patient is on Eliquis.  Chronic right great toe ulcer: Present on admission. Wound care consulted. They have recommended:  Cleanse right great toe with NS and pat dry. Apply Xeroform gauze to wound bed. Cover with dry gauze and tape. Change daily.   GERD: Increase PPI to BID for now  Obesity: Noted BMI 32.08. noted.  Family Communication/Anticipated D/C date and plan/Code Status   DVT prophylaxis: eliquis Code Status: Full Code.  Family Communication: none at bedside Disposition Plan: Home  Christopher Cobern, DO [facility] Direct contact: see [email or web address]   7PM-7AM contact night coverage as above 02/10/2019, 1:00 PM  LOS: 2 days

## 2019-02-11 ENCOUNTER — Inpatient Hospital Stay (HOSPITAL_COMMUNITY): Payer: No Typology Code available for payment source

## 2019-02-11 DIAGNOSIS — E8589 Other amyloidosis: Secondary | ICD-10-CM

## 2019-02-11 DIAGNOSIS — M79609 Pain in unspecified limb: Secondary | ICD-10-CM

## 2019-02-11 DIAGNOSIS — L0390 Cellulitis, unspecified: Secondary | ICD-10-CM

## 2019-02-11 DIAGNOSIS — M7989 Other specified soft tissue disorders: Secondary | ICD-10-CM

## 2019-02-11 DIAGNOSIS — L538 Other specified erythematous conditions: Secondary | ICD-10-CM

## 2019-02-11 LAB — BASIC METABOLIC PANEL
Anion gap: 12 (ref 5–15)
BUN: 22 mg/dL (ref 8–23)
CO2: 23 mmol/L (ref 22–32)
Calcium: 9.3 mg/dL (ref 8.9–10.3)
Chloride: 99 mmol/L (ref 98–111)
Creatinine, Ser: 1.48 mg/dL — ABNORMAL HIGH (ref 0.61–1.24)
GFR calc Af Amer: 52 mL/min — ABNORMAL LOW (ref >60)
GFR calc non Af Amer: 45 mL/min — ABNORMAL LOW (ref >60)
Glucose, Bld: 128 mg/dL — ABNORMAL HIGH (ref 70–99)
Potassium: 4.1 mmol/L (ref 3.5–5.1)
Sodium: 134 mmol/L — ABNORMAL LOW (ref 135–145)

## 2019-02-11 MED ORDER — METOLAZONE 2.5 MG PO TABS
2.5000 mg | ORAL_TABLET | Freq: Every day | ORAL | Status: DC
  Administered 2019-02-11 – 2019-02-14 (×4): 2.5 mg via ORAL
  Filled 2019-02-11 (×4): qty 1

## 2019-02-11 MED ORDER — POLYETHYLENE GLYCOL 3350 17 G PO PACK
17.0000 g | PACK | Freq: Every day | ORAL | Status: DC
  Administered 2019-02-12 – 2019-02-14 (×3): 17 g via ORAL
  Filled 2019-02-11 (×5): qty 1

## 2019-02-11 NOTE — Progress Notes (Signed)
VASCULAR LAB PRELIMINARY  PRELIMINARY  PRELIMINARY  PRELIMINARY  Left lower extremity venous duplex completed.    Preliminary report:  See CV proc for preliminary results.   Sharrod Achille, RVT 02/11/2019, 12:06 PM

## 2019-02-11 NOTE — Progress Notes (Signed)
Pharmacy Antibiotic Note  Christopher Benton is a 78 y.o. male admitted on 02/08/2019 with left leg swelling, erythema and pain.  Pharmacy has been consulted for vancomycin dosing for cellulitis of left leg.  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, hypertension, coronary artery disease, chronic kidney disease stage III, chronic infected right great toe  Afebrile, WBC 12 Scr 1.48, CrCl ~ 50 ml/min  Plan: Continue vancomycin 1500 mg IV Q 24 hrs (estimated vancomycin AUC on this regimen, using Scr 1.43, is 479; goal vancomycin AUC is 400-550) Obtain vancomycin peak and trough to calculate true vancomycin AUC Continue rocephin 1g q24 Monitor renal function, WBC, temp, cx, clinical improvement  Height: 5\' 11"  (180.3 cm) Weight: 232 lb 9.6 oz (105.5 kg) IBW/kg (Calculated) : 75.3  Temp (24hrs), Avg:97.9 F (36.6 C), Min:97.5 F (36.4 C), Max:98.2 F (36.8 C)  Estimated Creatinine Clearance: 50.9 mL/min (A) (by C-G formula based on SCr of 1.48 mg/dL (H)).   01/30/19  Allergies  Allergen Reactions  . Pradaxa [Dabigatran Etexilate Mesylate] Nausea Only  . Penicillins Rash    Did it involve swelling of the face/tongue/throat, SOB, or low BP? No Did it involve sudden or severe rash/hives, skin peeling, or any reaction on the inside of your mouth or nose? No Did you need to seek medical attention at a hospital or doctor's office? Unk When did it last happen? "it happened many years ago" If all above answers are "NO", may proceed with cephalosporin use.     Antimicrobials this admission: 9/9 cefriaxone >>  9/9 vancomycin>>  Microbiology results: 9/9 Blood cx X 2: NGTD  Thank you for allowing pharmacy to be a part of this patient's care.  Sherren Kerns, PharmD PGY1 Acute Care Pharmacy Resident [phone] 02/11/2019 9:50 AM

## 2019-02-11 NOTE — Progress Notes (Signed)
Advanced Heart Failure Rounding Note  PCP-Cardiologist: Kirk Ruths, MD   Subjective:    Admitted with severe LLE cellulitis and possible volume overload. Started on antibiotics.   IV lasix started yesterday. Moderate diuresis. Creatinine 1.25 -> 1.48 Weight recorded as up 5 pounds  Breathing better. Denies fevers or chills. No orthopnea or PND.   Objective:   Weight Range: 105.5 kg Body mass index is 32.44 kg/m.   Vital Signs:   Temp:  [97.5 F (36.4 C)-98.4 F (36.9 C)] 98.4 F (36.9 C) (09/12 1453) Pulse Rate:  [79-88] 88 (09/12 1453) Resp:  [18-19] 18 (09/12 0505) BP: (105-118)/(75-84) 111/80 (09/12 1453) SpO2:  [91 %-95 %] 95 % (09/12 1453) Weight:  [105.5 kg] 105.5 kg (09/12 0505) Last BM Date: 02/06/19  Weight change: Filed Weights   02/09/19 0701 02/10/19 0048 02/11/19 0505  Weight: 104.3 kg 103.2 kg 105.5 kg    Intake/Output:   Intake/Output Summary (Last 24 hours) at 02/11/2019 1554 Last data filed at 02/11/2019 1000 Gross per 24 hour  Intake 1532.46 ml  Output 775 ml  Net 757.46 ml      Physical Exam    General:  Well appearing. No resp difficulty HEENT: normal Neck: supple. JVP to jaw. Carotids 2+ bilat; no bruits. No lymphadenopathy or thryomegaly appreciated. Cor: PMI nondisplaced. Regular rate & rhythm. No rubs, gallops or murmurs. Lungs: clear Abdomen: soft, nontender, nondistended. No hepatosplenomegaly. No bruits or masses. Good bowel sounds. Extremities: no cyanosis, clubbing, rash, LLE 2+ edema. Erythema improving Neuro: alert & orientedx3, cranial nerves grossly intact. moves all 4 extremities w/o difficulty. Affect pleasant   Telemetry   SR 80-90 Personally reviewed   Labs    CBC Recent Labs    02/08/19 1850 02/09/19 0542  WBC 14.3* 12.0*  NEUTROABS 10.7*  --   HGB 16.6 16.3  HCT 51.7 49.9  MCV 87.5 86.9  PLT 191 161   Basic Metabolic Panel Recent Labs    02/08/19 2035 02/09/19 0542 02/10/19 0120  02/11/19 0610  NA 134* 132* 133* 134*  K 4.4 5.1 4.7 4.1  CL 101 99 100 99  CO2 22 22 22 23   GLUCOSE 115* 113* 125* 128*  BUN 28* 25* 24* 22  CREATININE 1.49* 1.36* 1.25* 1.48*  CALCIUM 9.4 9.1 9.3 9.3  MG 2.2 2.2  --   --    Liver Function Tests Recent Labs    02/08/19 1850  AST 23  ALT 16  ALKPHOS 87  BILITOT 3.5*  PROT 8.0  ALBUMIN 3.9   No results for input(s): LIPASE, AMYLASE in the last 72 hours. Cardiac Enzymes No results for input(s): CKTOTAL, CKMB, CKMBINDEX, TROPONINI in the last 72 hours.  BNP: BNP (last 3 results) Recent Labs    12/08/18 1042 12/19/18 1526 01/30/19 1257  BNP 720.6* 811.3* 706.0*    ProBNP (last 3 results) No results for input(s): PROBNP in the last 8760 hours.   D-Dimer No results for input(s): DDIMER in the last 72 hours. Hemoglobin A1C No results for input(s): HGBA1C in the last 72 hours. Fasting Lipid Panel No results for input(s): CHOL, HDL, LDLCALC, TRIG, CHOLHDL, LDLDIRECT in the last 72 hours. Thyroid Function Tests No results for input(s): TSH, T4TOTAL, T3FREE, THYROIDAB in the last 72 hours.  Invalid input(s): FREET3  Other results:   Imaging    Vas [name] Lower Extremity Venous (dvt)  Result Date: 02/11/2019  Lower Venous Study Indications: Pain, Swelling, Erythema, and cellulitis.  Risk Factors: CHF. Comparison  Study: No prior study on file Performing Technologist: Sharion Dove RVS  Examination Guidelines: A complete evaluation includes B-mode imaging, spectral Doppler, color Doppler, and power Doppler as needed of all accessible portions of each vessel. Bilateral testing is considered an integral part of a complete examination. Limited examinations for reoccurring indications may be performed as noted.  +-----+---------------+---------+-----------+----------+--------------+  RIGHT Compressibility Phasicity Spontaneity Properties Thrombus Aging  +-----+---------------+---------+-----------+----------+--------------+   CFV   Full                                             Pulsatile       +-----+---------------+---------+-----------+----------+--------------+   +---------+---------------+---------+-----------+----------+--------------+  LEFT      Compressibility Phasicity Spontaneity Properties Thrombus Aging  +---------+---------------+---------+-----------+----------+--------------+  CFV       Full                                             Pulsatile       +---------+---------------+---------+-----------+----------+--------------+  SFJ       Full                                                             +---------+---------------+---------+-----------+----------+--------------+  FV Prox   Full                                                             +---------+---------------+---------+-----------+----------+--------------+  FV Mid    Full                                                             +---------+---------------+---------+-----------+----------+--------------+  FV Distal Full                                                             +---------+---------------+---------+-----------+----------+--------------+  PFV       Full                                                             +---------+---------------+---------+-----------+----------+--------------+  POP       Full                                             Pulsatile       +---------+---------------+---------+-----------+----------+--------------+  PTV       Full                                                             +---------+---------------+---------+-----------+----------+--------------+  PERO      Full                                                             +---------+---------------+---------+-----------+----------+--------------+     Summary: Right: No evidence of common femoral vein obstruction. Left: There is no evidence of deep vein thrombosis in the lower extremity.  *See table(s) above for measurements and observations.  Electronically signed by Servando Snare MD on 02/11/2019 at 12:59:19 PM.    Final      Medications:     Scheduled Medications:  apixaban  5 mg Oral BID   carvedilol  3.125 mg Oral BID WC   dofetilide  125 mcg Oral BID   dorzolamide  1 drop Both Eyes BID   furosemide  80 mg Intravenous BID   latanoprost  1 drop Both Eyes QHS   pantoprazole  40 mg Oral BID   polyethylene glycol  17 g Oral Daily   rosuvastatin  20 mg Oral Daily   Tafamidis  61 mg Oral Daily    Infusions:  sodium chloride 250 mL (02/11/19 0035)   cefTRIAXone (ROCEPHIN)  IV 1 g (02/11/19 0037)   vancomycin 1,500 mg (02/10/19 2036)    PRN Medications: sodium chloride, acetaminophen **OR** acetaminophen, ondansetron **OR** ondansetron (ZOFRAN) IV, senna-docusate, traMADol    Assessment/Plan  1. 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 Have discussed gene silencer study at [place]  - ECHO 12/07/2018-EF 35-40% RV normal, LA dilated, RA severely dilated. - Volume status remains elevated. Continue IV lasix. May need a dose of metolazone  2.  LLE Cellulitis  - improving - BCX negative. Antibiotics Per Primary Team.   3. PAF  - Maintain NSR.   - Remain on Tikosyn. K (4.1) and Mag stable.  - If creatinine bumping or he develops hypokalemia would favor stopping Tikosyn and switching to amio after a washout. He will likely not tolerate AF will with cardiac amyloidosis. Continue tikosyn/eliquis.   4. CKD Stage III - Creatinine up slightly but still within baseline. Creatinine baseline 1.3-1.4  - Follow BMET closely.    Length of Stay: 3  Glori Bickers, MD  02/11/2019, 3:54 PM  Advanced Heart Failure Team Pager [phone] (M-F; 7a - 4p)  Please contact [facility] for night-coverage after hours (4p -7a ) and weekends on [email or web address]

## 2019-02-11 NOTE — Progress Notes (Signed)
PROGRESS NOTE  Christopher Benton AOZ:[id number] DOB: May 24, 1941 DOA: 02/08/2019 PCP: [name], MD  Brief History   Christopher Benton is an 78 y.o. 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 [name] office with complaints of left leg swelling, redness and pain. Patient is a poor historian and wife present at bedside mostly provides history. Patient started having increasing redness and pain in the left leg for the last 2 to 3 days. He also has been having increasing swelling of the left lower extremity for the last few days.  The patient tells me that he has had vascular work up on the right and left lower extremities in the days leading up to admission here. He states that this was done at [facility]. His wife states it was done at [facility]. He also had some vascular study at the [place]. I have asked nursing to get records for these studies.  The patient has been admitted to a telemetry bed. Cardiology has been consulted and he is being diuresed with Lasix 80 mg IV bid. He is receiving IV Ceftriaxone and Vancomycin for his cellulitis. Blood cultures x 2 have been obtained, but have had no growth. Wound care has been consulted to care for the blood filled bulla on his right great toe. Right lower extremity is wrapped.  Consultants  . Heart Failure . Wound Care consult  Procedures  . None  Antibiotics   Anti-infectives (From admission, onward)   Start     Dose/Rate Route Frequency Ordered Stop   02/09/19 2200  cefTRIAXone (ROCEPHIN) 1 g in sodium chloride 0.9 % 100 mL IVPB     1 g 200 mL/hr over 30 Minutes Intravenous Every 24 hours 02/08/19 1814     02/09/19 2030  vancomycin (VANCOCIN) 1,500 mg in sodium chloride 0.9 % 500 mL IVPB     1,500 mg 250 mL/hr over  120 Minutes Intravenous Every 24 hours 02/08/19 2029     02/08/19 1930  cefTRIAXone (ROCEPHIN) 1 g in sodium chloride 0.9 % 100 mL IVPB     1 g 200 mL/hr over 30 Minutes Intravenous STAT 02/08/19 1921 02/09/19 0236   02/08/19 1930  vancomycin (VANCOCIN) 1,750 mg in sodium chloride 0.9 % 500 mL IVPB     1,750 mg 250 mL/hr over 120 Minutes Intravenous  Once 02/08/19 1922 02/08/19 2327     Subjective  The patient is resting comfortably. No new complaints. Left lower extremity remains swollen.  Objective   Vitals:  Vitals:   02/11/19 1300 02/11/19 1453  BP:  111/80  Pulse: 88 88  Resp:    Temp:  98.4 F (36.9 C)  SpO2:  95%    Exam:  Constitutional:  . The patient is awake, alert, and oriented x 3. No acute distress. Respiratory:  . There is no increased work of breathing. . No wheezes, rales, or rhonchi. . No tactile fremitus. Cardiovascular:  . Regular rate and rhythm. . No murmurs, ectopy, or gallups  . No lateral PMI. No thrills. Abdomen:  . Abdomen is soft, non-tender, non-distended. . No hernias, masses, or organomegaly . Normoactive bowel sounds. Musculoskeletal:  . Right lower extremity is wrapped in an ace bandage. Left lower extremity is also partially wrapped and edematous and erythematous. No change since yesterday. Skin:  . There is a blister on his right great  toe. Neurologic:  . CN 2-12 intact . Sensation all 4 extremities intact Psychiatric:  . Mental status o Mood, affect appropriate o Orientation to person, place, time  . judgment and insight appear intact    I have personally reviewed the following:   Today's Data  . Vitals, BMP, Doppler left lower extremity  Micro Data  . Blood Cultures: No growth  Scheduled Meds: . apixaban  5 mg Oral BID  . carvedilol  3.125 mg Oral BID WC  . dofetilide  125 mcg Oral BID  . dorzolamide  1 drop Both Eyes BID  . furosemide  80 mg Intravenous BID  . latanoprost  1 drop Both Eyes QHS  . pantoprazole   40 mg Oral BID  . polyethylene glycol  17 g Oral Daily  . rosuvastatin  20 mg Oral Daily  . Tafamidis  61 mg Oral Daily   Continuous Infusions: . sodium chloride 250 mL (02/11/19 0035)  . cefTRIAXone (ROCEPHIN)  IV 1 g (02/11/19 0037)  . vancomycin 1,500 mg (02/10/19 2036)    Principal Problem:   Cellulitis Active Problems:   Essential hypertension   PAF (paroxysmal atrial fibrillation) (HCC)   CAD (coronary artery disease)   Amyloidosis (HCC)   Acute on chronic combined systolic and diastolic CHF (congestive heart failure) (HCC)   CKD (chronic kidney disease), stage III ([place])   LOS: 3 days   A & P  Left lower extremity cellulitis: The patient has received Rocephin and Vancomycin. Blood cultures have had no growth to date. WBC's trending down. The patient tells me that he has had vascular work up on the right and left lower extremities in the days leading up to admission here. He states that this was done at [facility]. His wife states it was done at [facility]. He also had some vascular study at the [place]. I have asked nursing to get records for these studies.  Acute on chronic combined systolic and diastolic heart failure: Echo in July 2020 had shownEF of 35-40 % with biatrial enlargement. CHF team: Lasix 80 mg IV daily has been restarted. He is on a 1500 cc fluid restriction. Fluid balance remains positive by 1.4 liters.   Amyloidosis: Continue tafamidis. Outpatient follow-up.  Paroxysmal A. Fib: Currently in sinus rhythm. management per cardiology. If creatinine bumping or he develops hypokalemia would favor stopping Tikosyn and switching to [facility] after a washout. He will likely not tolerate AF will with cardiac amyloidosis.The patient will be continued on Tikosyn and Eliquis.  Hypertension: Monitor blood pressure. Good control on carvedilol.  Chronic renal disease stage III: Avoid nephrotoxic substances and hypotension. Monitor creatinine, electrolytes, and volume status.  Creatinien 1.48 today.  Coronary artery disease: Diagnosed previously based on CT scan showing coronary calcification. Continue statin. No aspirin as patient is on Eliquis.  Chronic right great toe ulcer: Present on admission. Wound care consulted. They have recommended:  Cleanse right great toe with NS and pat dry. Apply Xeroform gauze to wound bed. Cover with dry gauze and tape. Change daily.   GERD: Increase PPI to BID for now  Obesity: Noted BMI 32.08. noted.  I have seen and examined this patient myself. I have spent 42 minuets in his evaluation and care.  Family Communication/Anticipated D/C date and plan/Code Status   DVT prophylaxis: eliquis Code Status: Full Code.  Family Communication: I spoke with the wife on the phone. Disposition Plan: Home  Judithann Villamar, DO [facility] Direct contact: see [email or web address]  7PM-7AM contact night  coverage as above 02/11/2019, 4:17 PM  LOS: 2 days

## 2019-02-12 DIAGNOSIS — L03116 Cellulitis of left lower limb: Principal | ICD-10-CM

## 2019-02-12 LAB — BASIC METABOLIC PANEL
Anion gap: 12 (ref 5–15)
BUN: 19 mg/dL (ref 8–23)
CO2: 24 mmol/L (ref 22–32)
Calcium: 8.9 mg/dL (ref 8.9–10.3)
Chloride: 97 mmol/L — ABNORMAL LOW (ref 98–111)
Creatinine, Ser: 1.44 mg/dL — ABNORMAL HIGH (ref 0.61–1.24)
GFR calc Af Amer: 54 mL/min — ABNORMAL LOW (ref >60)
GFR calc non Af Amer: 46 mL/min — ABNORMAL LOW (ref >60)
Glucose, Bld: 98 mg/dL (ref 70–99)
Potassium: 3.8 mmol/L (ref 3.5–5.1)
Sodium: 133 mmol/L — ABNORMAL LOW (ref 135–145)

## 2019-02-12 LAB — CBC WITH DIFFERENTIAL/PLATELET
Abs Immature Granulocytes: 0.11 10*3/uL — ABNORMAL HIGH (ref 0.00–0.07)
Basophils Absolute: 0 10*3/uL (ref 0.0–0.1)
Basophils Relative: 0 %
Eosinophils Absolute: 0.1 10*3/uL (ref 0.0–0.5)
Eosinophils Relative: 1 %
HCT: 47.7 % (ref 39.0–52.0)
Hemoglobin: 15.3 g/dL (ref 13.0–17.0)
Immature Granulocytes: 1 %
Lymphocytes Relative: 16 %
Lymphs Abs: 1.9 10*3/uL (ref 0.7–4.0)
MCH: 27.8 pg (ref 26.0–34.0)
MCHC: 32.1 g/dL (ref 30.0–36.0)
MCV: 86.7 fL (ref 80.0–100.0)
Monocytes Absolute: 1.5 10*3/uL — ABNORMAL HIGH (ref 0.1–1.0)
Monocytes Relative: 12 %
Neutro Abs: 8.4 10*3/uL — ABNORMAL HIGH (ref 1.7–7.7)
Neutrophils Relative %: 70 %
Platelets: 187 10*3/uL (ref 150–400)
RBC: 5.5 MIL/uL (ref 4.22–5.81)
RDW: 12.9 % (ref 11.5–15.5)
WBC: 12.1 10*3/uL — ABNORMAL HIGH (ref 4.0–10.5)
nRBC: 0 % (ref 0.0–0.2)

## 2019-02-12 LAB — MAGNESIUM: Magnesium: 2 mg/dL (ref 1.7–2.4)

## 2019-02-12 LAB — VANCOMYCIN, PEAK: Vancomycin Pk: 35 ug/mL (ref 30–40)

## 2019-02-12 LAB — VANCOMYCIN, TROUGH: Vancomycin Tr: 15 ug/mL (ref 15–20)

## 2019-02-12 MED ORDER — POTASSIUM CHLORIDE CRYS ER 20 MEQ PO TBCR
40.0000 meq | EXTENDED_RELEASE_TABLET | Freq: Once | ORAL | Status: AC
  Administered 2019-02-12: 40 meq via ORAL
  Filled 2019-02-12: qty 2

## 2019-02-12 MED ORDER — VANCOMYCIN HCL 10 G IV SOLR
1250.0000 mg | INTRAVENOUS | Status: DC
  Administered 2019-02-12 – 2019-02-13 (×2): 1250 mg via INTRAVENOUS
  Filled 2019-02-12 (×3): qty 1250

## 2019-02-12 NOTE — Progress Notes (Signed)
Advanced Heart Failure Rounding Note  PCP-Cardiologist: Kirk Ruths, MD   Subjective:    Admitted with severe LLE cellulitis and possible volume overload. Started on antibiotics.   Remains on IV lasix. Breathing better. No orthopnea or PND. LE edema improving    Objective:   Weight Range: 105.2 kg Body mass index is 32.34 kg/m.   Vital Signs:   Temp:  [97.7 F (36.5 C)-98.7 F (37.1 C)] 98 F (36.7 C) (09/13 1203) Pulse Rate:  [62-91] 62 (09/13 1203) Resp:  [20] 20 (09/13 1203) BP: (102-123)/(73-86) 102/73 (09/13 1203) SpO2:  [92 %-98 %] 95 % (09/13 1203) Weight:  [105.2 kg] 105.2 kg (09/13 0409) Last BM Date: 02/11/19  Weight change: Filed Weights   02/10/19 0048 02/11/19 0505 02/12/19 0409  Weight: 103.2 kg 105.5 kg 105.2 kg    Intake/Output:   Intake/Output Summary (Last 24 hours) at 02/12/2019 1605 Last data filed at 02/12/2019 1349 Gross per 24 hour  Intake 1797 ml  Output 2650 ml  Net -853 ml      Physical Exam    General:  Well appearing. No resp difficulty HEENT: normal Neck: supple. JVP 9-10 Carotids 2+ bilat; no bruits. No lymphadenopathy or thryomegaly appreciated. Cor: PMI nondisplaced. Regular rate & rhythm. No rubs, gallops or murmurs. Lungs: clear Abdomen: soft, nontender, nondistended. No hepatosplenomegaly. No bruits or masses. Good bowel sounds. Extremities: no cyanosis, clubbing, rash, tr edema on right. LLE still with erythema and 1+ edema Neuro: alert & orientedx3, cranial nerves grossly intact. moves all 4 extremities w/o difficulty. Affect pleasant   Telemetry   SR 60-70 occasional brady in 38s Personally reviewed   Labs    CBC Recent Labs    02/12/19 0020  WBC 12.1*  NEUTROABS 8.4*  HGB 15.3  HCT 47.7  MCV 86.7  PLT 465   Basic Metabolic Panel Recent Labs    02/11/19 0610 02/12/19 0020  NA 134* 133*  K 4.1 3.8  CL 99 97*  CO2 23 24  GLUCOSE 128* 98  BUN 22 19  CREATININE 1.48* 1.44*  CALCIUM 9.3 8.9   MG  --  2.0   Liver Function Tests No results for input(s): AST, ALT, ALKPHOS, BILITOT, PROT, ALBUMIN in the last 72 hours. No results for input(s): LIPASE, AMYLASE in the last 72 hours. Cardiac Enzymes No results for input(s): CKTOTAL, CKMB, CKMBINDEX, TROPONINI in the last 72 hours.  BNP: BNP (last 3 results) Recent Labs    12/08/18 1042 12/19/18 1526 01/30/19 1257  BNP 720.6* 811.3* 706.0*    ProBNP (last 3 results) No results for input(s): PROBNP in the last 8760 hours.   D-Dimer No results for input(s): DDIMER in the last 72 hours. Hemoglobin A1C No results for input(s): HGBA1C in the last 72 hours. Fasting Lipid Panel No results for input(s): CHOL, HDL, LDLCALC, TRIG, CHOLHDL, LDLDIRECT in the last 72 hours. Thyroid Function Tests No results for input(s): TSH, T4TOTAL, T3FREE, THYROIDAB in the last 72 hours.  Invalid input(s): FREET3  Other results:   Imaging    No results found.   Medications:     Scheduled Medications: . apixaban  5 mg Oral BID  . carvedilol  3.125 mg Oral BID WC  . dofetilide  125 mcg Oral BID  . dorzolamide  1 drop Both Eyes BID  . furosemide  80 mg Intravenous BID  . latanoprost  1 drop Both Eyes QHS  . metolazone  2.5 mg Oral Daily  . pantoprazole  40  mg Oral BID  . polyethylene glycol  17 g Oral Daily  . rosuvastatin  20 mg Oral Daily  . Tafamidis  61 mg Oral Daily    Infusions: . sodium chloride 250 mL (02/11/19 0035)  . cefTRIAXone (ROCEPHIN)  IV 1 g (02/11/19 2329)  . vancomycin 1,500 mg (02/11/19 2056)    PRN Medications: sodium chloride, acetaminophen **OR** acetaminophen, ondansetron **OR** ondansetron (ZOFRAN) IV, senna-docusate, traMADol    Assessment/Plan  1. 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 Have discussed gene silencer study at [place]  - ECHO 12/07/2018-EF 35-40% RV normal, LA dilated, RA severely dilated. - Volume  status remains improving but still a bit elevated. Continue IV lasix one more day. Switch to po in am  2.  LLE Cellulitis  - improving but still erythematous - BCX negative. Antibiotics Per Primary Team.   3. PAF  - Maintain NSR.   - Remain on Tikosyn. K (4.1) and Mag stable.  - QT increased. Will need to follow closely.  - If creatinine bumping or he develops hypokalemia would favor stopping Tikosyn and switching to amio after a washout. He will likely not tolerate AF will with cardiac amyloidosis. Continue tikosyn/eliquis.   4. CKD Stage III - Creatinine stable. Creatinine baseline 1.3-1.4  - Follow BMET closely.    Length of Stay: Gray, MD  02/12/2019, 4:05 PM  Advanced Heart Failure Team Pager [phone] (M-F; 7a - 4p)  Please contact [facility] for night-coverage after hours (4p -7a ) and weekends on [email or web address]

## 2019-02-12 NOTE — Progress Notes (Signed)
Pt's spouse brought 2 tafamidis capsules 61 mg from home. Medication sent to pharmacy and signed white medication form in pt's chart.

## 2019-02-12 NOTE — Progress Notes (Signed)
Wife requests to do patient's wound care and patient agrees. No additional items needed. All questions and concerns answered.

## 2019-02-12 NOTE — Progress Notes (Addendum)
Pharmacy Antibiotic Note  Christopher Benton is a 78 y.o. male admitted on 02/08/2019 with left leg swelling, erythema and pain.  Pharmacy has been consulted for vancomycin dosing for cellulitis of left leg. -Vanc peak= 35; trough= 15; calculated AUC= 578.8 -WBC= 12.1, afeb, SCr= 1.44   Plan: Change vancomycin to 1250mg  IV q24h (estimated AUC= 482) Continue rocephin 1g q24 Monitor renal function, WBC, temp, cx, clinical improvement   Height: 5\' 11"  (180.3 cm) Weight: 231 lb 14.4 oz (105.2 kg)(scale c) IBW/kg (Calculated) : 75.3  Temp (24hrs), Avg:98.2 F (36.8 C), Min:97.7 F (36.5 C), Max:98.7 F (37.1 C)  Estimated Creatinine Clearance: 52.2 mL/min (A) (by C-G formula based on SCr of 1.44 mg/dL (H)).   01/30/19  Allergies  Allergen Reactions  . Pradaxa [Dabigatran Etexilate Mesylate] Nausea Only  . Penicillins Rash    Did it involve swelling of the face/tongue/throat, SOB, or low BP? No Did it involve sudden or severe rash/hives, skin peeling, or any reaction on the inside of your mouth or nose? No Did you need to seek medical attention at a hospital or doctor's office? Unk When did it last happen? "it happened many years ago" If all above answers are "NO", may proceed with cephalosporin use.     Antimicrobials this admission: 9/9 cefriaxone >>  9/9 vancomycin>>  Microbiology results: 9/9 Blood cx X 2: NGTD  Thank you for allowing pharmacy to be a part of this patient's care.  Hildred Laser, PharmD Clinical Pharmacist **Pharmacist phone directory can now be found on [email or web address] ([email or web address]).  Listed under [place].

## 2019-02-12 NOTE — Plan of Care (Signed)
  Problem: Nutrition: Goal: Adequate nutrition will be maintained Outcome: Completed/Met   Problem: Coping: Goal: Level of anxiety will decrease Outcome: Completed/Met   Problem: Elimination: Goal: Will not experience complications related to bowel motility Outcome: Completed/Met Goal: Will not experience complications related to urinary retention Outcome: Completed/Met   Problem: Safety: Goal: Ability to remain free from injury will improve Outcome: Completed/Met   Problem: Skin Integrity: Goal: Risk for impaired skin integrity will decrease Outcome: Completed/Met   

## 2019-02-12 NOTE — Progress Notes (Signed)
PROGRESS NOTE  Christopher Benton HTD:[id number] DOB: 01-13-1941 DOA: 02/08/2019 PCP: Christopher Darling, MD  Brief History   Christopher Benton is an 78 y.o. 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 Jacalyn Benton office with complaints of left leg swelling, redness and pain. Patient is a poor historian and wife present at bedside mostly provides history. Patient started having increasing redness and pain in the left leg for the last 2 to 3 days. He also has been having increasing swelling of the left lower extremity for the last few days.  The patient tells me that he has had vascular work up on the right and left lower extremities in the days leading up to admission here. He states that this was done at [facility]. His wife states it was done at [facility]. He also had some vascular study at the [place]. I have asked nursing to get records for these studies.  The patient has been admitted to a telemetry bed. Cardiology has been consulted and he is being diuresed with Lasix 80 mg IV bid. He is receiving IV Ceftriaxone and Vancomycin for his cellulitis. Blood cultures x 2 have been obtained, but have had no growth. Wound care has been consulted to care for the blood filled bulla on his right great toe. Right lower extremity is wrapped.  Consultants  . Heart Failure . Wound Care consult  Procedures  . None  Antibiotics   Anti-infectives (From admission, onward)   Start     Dose/Rate Route Frequency Ordered Stop   02/09/19 2200  cefTRIAXone (ROCEPHIN) 1 g in sodium chloride 0.9 % 100 mL IVPB     1 g 200 mL/hr over 30 Minutes Intravenous Every 24 hours 02/08/19 1814     02/09/19 2030  vancomycin (VANCOCIN) 1,500 mg in sodium chloride 0.9 % 500 mL IVPB     1,500 mg 250 mL/hr over  120 Minutes Intravenous Every 24 hours 02/08/19 2029     02/08/19 1930  cefTRIAXone (ROCEPHIN) 1 g in sodium chloride 0.9 % 100 mL IVPB     1 g 200 mL/hr over 30 Minutes Intravenous STAT 02/08/19 1921 02/09/19 0236   02/08/19 1930  vancomycin (VANCOCIN) 1,750 mg in sodium chloride 0.9 % 500 mL IVPB     1,750 mg 250 mL/hr over 120 Minutes Intravenous  Once 02/08/19 1922 02/08/19 2327     Subjective  The patient is resting comfortably. No new complaints. Left lower extremity remains swollen.  Objective   Vitals:  Vitals:   02/12/19 0933 02/12/19 1203  BP: 107/84 102/73  Pulse: 86 62  Resp:  20  Temp: 97.7 F (36.5 C) 98 F (36.7 C)  SpO2: 98% 95%    Exam:  Constitutional:  . The patient is awake, alert, and oriented x 3. No acute distress. Respiratory:  . There is no increased work of breathing. . No wheezes, rales, or rhonchi. . No tactile fremitus. Cardiovascular:  . Regular rate and rhythm. . No murmurs, ectopy, or gallups  . No lateral PMI. No thrills. Abdomen:  . Abdomen is soft, non-tender, non-distended. . No hernias, masses, or organomegaly . Normoactive bowel sounds. Musculoskeletal:  . Right lower extremity is wrapped in an ace bandage. Left lower extremity is unwrapped. There is marked wrinkling and diminished swelling to the left lower extremity. Decreased warmth. No tenderness. Skin:  .  There is a blister on his right great toe. Neurologic:  . CN 2-12 intact . Sensation all 4 extremities intact Psychiatric:  . Mental status o Mood, affect appropriate o Orientation to person, place, time  . judgment and insight appear intact    I have personally reviewed the following:   Today's Data  . Vitals, BMP, Doppler left lower extremity  Micro Data  . Blood Cultures: No growth  Scheduled Meds: . apixaban  5 mg Oral BID  . carvedilol  3.125 mg Oral BID WC  . dofetilide  125 mcg Oral BID  . dorzolamide  1 drop Both Eyes BID  . furosemide  80 mg  Intravenous BID  . latanoprost  1 drop Both Eyes QHS  . metolazone  2.5 mg Oral Daily  . pantoprazole  40 mg Oral BID  . polyethylene glycol  17 g Oral Daily  . rosuvastatin  20 mg Oral Daily  . Tafamidis  61 mg Oral Daily   Continuous Infusions: . sodium chloride 250 mL (02/11/19 0035)  . cefTRIAXone (ROCEPHIN)  IV 1 g (02/11/19 2329)  . vancomycin 1,500 mg (02/11/19 2056)    Principal Problem:   Cellulitis Active Problems:   Essential hypertension   PAF (paroxysmal atrial fibrillation) (HCC)   CAD (coronary artery disease)   Amyloidosis (HCC)   Acute on chronic combined systolic and diastolic CHF (congestive heart failure) (HCC)   CKD (chronic kidney disease), stage III ([place])   LOS: 4 days   A & P  Left lower extremity cellulitis: Appears improved with reduced swelling and no warmth. Still a little red. Monitor. The patient has received Rocephin and Vancomycin. Blood cultures have had no growth to date. WBC's trending down. The patient tells me that he has had vascular work up on the right and left lower extremities in the days leading up to admission here. He states that this was done at [facility]. His wife states it was done at [facility]. He also had some vascular study at the [place]. I have asked nursing to get records for these studies.  Acute on chronic combined systolic and diastolic heart failure: Echo in July 2020 had shownEF of 35-40 % with biatrial enlargement. CHF team: Lasix 80 mg IV daily has been restarted. He is on a 1500 cc fluid restriction. Fluid balance remains positive by 4 liters.   Amyloidosis: Continue tafamidis. Outpatient follow-up.  Paroxysmal A. Fib: Currently in sinus rhythm. management per cardiology. If creatinine bumping or he develops hypokalemia would favor stopping Tikosyn and switching to [facility] after a washout. He will likely not tolerate AF will with cardiac amyloidosis.The patient will be continued on Tikosyn and Eliquis.  Hypertension: Monitor  blood pressure. Good control on carvedilol.  Chronic renal disease stage III: Avoid nephrotoxic substances and hypotension. Monitor creatinine, electrolytes, and volume status. Creatinien 1.48 today.  Coronary artery disease: Diagnosed previously based on CT scan showing coronary calcification. Continue statin. No aspirin as patient is on Eliquis.  Chronic right great toe ulcer: Present on admission. Wound care consulted. They have recommended:  Cleanse right great toe with NS and pat dry. Apply Xeroform gauze to wound bed. Cover with dry gauze and tape. Change daily.   GERD: Increase PPI to BID for now  Obesity: Noted BMI 32.08. noted.  I have seen and examined this patient myself. I have spent 32 minuets in his evaluation and care.  Family Communication/Anticipated D/C date and plan/Code Status   DVT prophylaxis: eliquis Code Status: Full  Code.  Family Communication: Wife is at bedside. Disposition Plan: Home  Shermika Balthaser, DO [facility] Direct contact: see [email or web address]  7PM-7AM contact night coverage as above 02/12/2019, 2:48 PM  LOS: 2 days

## 2019-02-12 NOTE — Progress Notes (Signed)
Per CCMD, pt had missed beats and a pair of PVCs. STAT EKG completed and shows sinus rhythm with first degree AV block. Pt asymptomatic, denied chest pain, shortness of breath. [facility] night coverage paged to make aware.

## 2019-02-13 DIAGNOSIS — I2510 Atherosclerotic heart disease of native coronary artery without angina pectoris: Secondary | ICD-10-CM

## 2019-02-13 DIAGNOSIS — N183 Chronic kidney disease, stage 3 (moderate): Secondary | ICD-10-CM

## 2019-02-13 DIAGNOSIS — I10 Essential (primary) hypertension: Secondary | ICD-10-CM

## 2019-02-13 DIAGNOSIS — E859 Amyloidosis, unspecified: Secondary | ICD-10-CM

## 2019-02-13 DIAGNOSIS — I480 Paroxysmal atrial fibrillation: Secondary | ICD-10-CM

## 2019-02-13 LAB — BASIC METABOLIC PANEL
Anion gap: 11 (ref 5–15)
Anion gap: 11 (ref 5–15)
BUN: 14 mg/dL (ref 8–23)
BUN: 14 mg/dL (ref 8–23)
CO2: 29 mmol/L (ref 22–32)
CO2: 30 mmol/L (ref 22–32)
Calcium: 8.7 mg/dL — ABNORMAL LOW (ref 8.9–10.3)
Calcium: 9.4 mg/dL (ref 8.9–10.3)
Chloride: 93 mmol/L — ABNORMAL LOW (ref 98–111)
Chloride: 92 mmol/L — ABNORMAL LOW (ref 98–111)
Creatinine, Ser: 1.24 mg/dL (ref 0.61–1.24)
Creatinine, Ser: 1.49 mg/dL — ABNORMAL HIGH (ref 0.61–1.24)
GFR calc Af Amer: >60 mL/min (ref >60)
GFR calc Af Amer: 51 mL/min — ABNORMAL LOW (ref >60)
GFR calc non Af Amer: 55 mL/min — ABNORMAL LOW (ref >60)
GFR calc non Af Amer: 44 mL/min — ABNORMAL LOW (ref >60)
Glucose, Bld: 91 mg/dL (ref 70–99)
Glucose, Bld: 131 mg/dL — ABNORMAL HIGH (ref 70–99)
Potassium: 3.1 mmol/L — ABNORMAL LOW (ref 3.5–5.1)
Potassium: 4.5 mmol/L (ref 3.5–5.1)
Sodium: 133 mmol/L — ABNORMAL LOW (ref 135–145)
Sodium: 133 mmol/L — ABNORMAL LOW (ref 135–145)

## 2019-02-13 LAB — CBC WITH DIFFERENTIAL/PLATELET
Abs Immature Granulocytes: 0.16 10*3/uL — ABNORMAL HIGH (ref 0.00–0.07)
Basophils Absolute: 0.1 10*3/uL (ref 0.0–0.1)
Basophils Relative: 0 %
Eosinophils Absolute: 0.1 10*3/uL (ref 0.0–0.5)
Eosinophils Relative: 1 %
HCT: 47.5 % (ref 39.0–52.0)
Hemoglobin: 15.2 g/dL (ref 13.0–17.0)
Immature Granulocytes: 1 %
Lymphocytes Relative: 14 %
Lymphs Abs: 1.6 10*3/uL (ref 0.7–4.0)
MCH: 27.6 pg (ref 26.0–34.0)
MCHC: 32 g/dL (ref 30.0–36.0)
MCV: 86.4 fL (ref 80.0–100.0)
Monocytes Absolute: 1.2 10*3/uL — ABNORMAL HIGH (ref 0.1–1.0)
Monocytes Relative: 10 %
Neutro Abs: 8.5 10*3/uL — ABNORMAL HIGH (ref 1.7–7.7)
Neutrophils Relative %: 74 %
Platelets: 271 10*3/uL (ref 150–400)
RBC: 5.5 MIL/uL (ref 4.22–5.81)
RDW: 12.8 % (ref 11.5–15.5)
WBC: 11.6 10*3/uL — ABNORMAL HIGH (ref 4.0–10.5)
nRBC: 0 % (ref 0.0–0.2)

## 2019-02-13 LAB — CULTURE, BLOOD (ROUTINE X 2)
Culture: NO GROWTH
Culture: NO GROWTH
Special Requests: ADEQUATE
Special Requests: ADEQUATE

## 2019-02-13 LAB — MAGNESIUM: Magnesium: 1.8 mg/dL (ref 1.7–2.4)

## 2019-02-13 MED ORDER — TORSEMIDE 20 MG PO TABS
60.0000 mg | ORAL_TABLET | Freq: Every day | ORAL | Status: DC
  Administered 2019-02-13 – 2019-02-15 (×3): 60 mg via ORAL
  Filled 2019-02-13 (×3): qty 3

## 2019-02-13 MED ORDER — POTASSIUM CHLORIDE CRYS ER 20 MEQ PO TBCR
40.0000 meq | EXTENDED_RELEASE_TABLET | Freq: Two times a day (BID) | ORAL | Status: DC
  Administered 2019-02-13: 40 meq via ORAL
  Filled 2019-02-13: qty 2

## 2019-02-13 MED ORDER — POTASSIUM CHLORIDE CRYS ER 20 MEQ PO TBCR
40.0000 meq | EXTENDED_RELEASE_TABLET | Freq: Once | ORAL | Status: AC
  Administered 2019-02-13: 40 meq via ORAL
  Filled 2019-02-13: qty 2

## 2019-02-13 MED ORDER — MAGNESIUM SULFATE 4 GM/100ML IV SOLN
4.0000 g | Freq: Once | INTRAVENOUS | Status: AC
  Administered 2019-02-13: 4 g via INTRAVENOUS
  Filled 2019-02-13: qty 100

## 2019-02-13 MED ORDER — POTASSIUM CHLORIDE CRYS ER 20 MEQ PO TBCR
40.0000 meq | EXTENDED_RELEASE_TABLET | Freq: Once | ORAL | Status: DC
  Administered 2019-02-13: 40 meq via ORAL

## 2019-02-13 MED ORDER — POTASSIUM CHLORIDE CRYS ER 20 MEQ PO TBCR
40.0000 meq | EXTENDED_RELEASE_TABLET | Freq: Two times a day (BID) | ORAL | Status: DC
  Administered 2019-02-14 (×2): 40 meq via ORAL
  Filled 2019-02-13 (×2): qty 2

## 2019-02-13 MED ORDER — CEPHALEXIN 250 MG PO CAPS: 500.0000 mg | ORAL_CAPSULE | Freq: Three times a day (TID) | ORAL | Status: DC

## 2019-02-13 MED ORDER — POTASSIUM CHLORIDE CRYS ER 20 MEQ PO TBCR: 40.0000 meq | EXTENDED_RELEASE_TABLET | Freq: Every day | ORAL | Status: DC

## 2019-02-13 NOTE — Plan of Care (Signed)
  Problem: Education: Goal: Knowledge of General Education information will improve Description: Including pain rating scale, medication(s)/side effects and non-pharmacologic comfort measures Outcome: Progressing   Problem: Health Behavior/Discharge Planning: Goal: Ability to manage health-related needs will improve Outcome: Progressing   Problem: Clinical Measurements: Goal: Ability to maintain clinical measurements within normal limits will improve Outcome: Progressing Goal: Will remain free from infection Outcome: Progressing Goal: Diagnostic test results will improve Outcome: Progressing Goal: Respiratory complications will improve Outcome: Progressing Goal: Cardiovascular complication will be avoided Outcome: Progressing   Problem: Activity: Goal: Risk for activity intolerance will decrease Outcome: Progressing   Problem: Pain Managment: Goal: General experience of comfort will improve Outcome: Progressing   Problem: Education: Goal: Ability to demonstrate management of disease process will improve Outcome: Progressing Goal: Ability to verbalize understanding of medication therapies will improve Outcome: Progressing Goal: Individualized Educational Video(s) Outcome: Progressing   Problem: Activity: Goal: Capacity to carry out activities will improve Outcome: Progressing   Problem: Cardiac: Goal: Ability to achieve and maintain adequate cardiopulmonary perfusion will improve Outcome: Progressing

## 2019-02-13 NOTE — Progress Notes (Addendum)
Advanced Heart Failure Rounding Note  PCP-Cardiologist: Kirk Ruths, MD   Subjective:    Admitted with severe LLE cellulitis and possible volume overload. Remains on antibiotics.   Yesterday diuresed with IV lasix + metolazone. Brisk diuresis noted. Weight down 8 pounds.   Denies SOB. Having some left leg pain.   Objective:   Weight Range: 101.3 kg Body mass index is 31.14 kg/m.   Vital Signs:   Temp:  [97.7 F (36.5 C)-98.7 F (37.1 C)] 98.5 F (36.9 C) (09/14 0438) Pulse Rate:  [62-88] 84 (09/14 0438) Resp:  [18-20] 18 (09/14 0438) BP: (101-113)/(73-87) 104/77 (09/14 0438) SpO2:  [93 %-98 %] 93 % (09/14 0438) Weight:  [101.3 kg] 101.3 kg (09/14 0438) Last BM Date: 02/12/19  Weight change: Filed Weights   02/11/19 0505 02/12/19 0409 02/13/19 0438  Weight: 105.5 kg 105.2 kg 101.3 kg    Intake/Output:   Intake/Output Summary (Last 24 hours) at 02/13/2019 0837 Last data filed at 02/13/2019 0443 Gross per 24 hour  Intake 700 ml  Output 4826 ml  Net -4126 ml      Physical Exam    General:  No resp difficulty. In bed.  HEENT: normal anicteric Neck: supple. JVP 9-10 . Carotids 2+ bilat; no bruits. No lymphadenopathy or thryomegaly appreciated. Cor: PMI nondisplaced. Regular rate & rhythm. No rubs, gallops or murmurs. Lungs: clear on room air.  No wheeze Abdomen: soft, nontender, nondistended. No hepatosplenomegaly. No bruits or masses. Good bowel sounds. Extremities: no cyanosis, clubbing, rash, LLE erythema with minimal edema Neuro: alert & orientedx3, cranial nerves grossly intact. moves all 4 extremities w/o difficulty. Affect pleasant   Telemetry  SR 60-70s with occasional missed beaths    Labs    CBC Recent Labs    02/12/19 0020 02/13/19 0631  WBC 12.1* 11.6*  NEUTROABS 8.4* 8.5*  HGB 15.3 15.2  HCT 47.7 47.5  MCV 86.7 86.4  PLT 187 371   Basic Metabolic Panel Recent Labs    02/12/19 0020 02/13/19 0631  NA 133* 133*  K 3.8 3.1*   CL 97* 93*  CO2 24 29  GLUCOSE 98 91  BUN 19 14  CREATININE 1.44* 1.24  CALCIUM 8.9 8.7*  MG 2.0  --    Liver Function Tests No results for input(s): AST, ALT, ALKPHOS, BILITOT, PROT, ALBUMIN in the last 72 hours. No results for input(s): LIPASE, AMYLASE in the last 72 hours. Cardiac Enzymes No results for input(s): CKTOTAL, CKMB, CKMBINDEX, TROPONINI in the last 72 hours.  BNP: BNP (last 3 results) Recent Labs    12/08/18 1042 12/19/18 1526 01/30/19 1257  BNP 720.6* 811.3* 706.0*    ProBNP (last 3 results) No results for input(s): PROBNP in the last 8760 hours.   D-Dimer No results for input(s): DDIMER in the last 72 hours. Hemoglobin A1C No results for input(s): HGBA1C in the last 72 hours. Fasting Lipid Panel No results for input(s): CHOL, HDL, LDLCALC, TRIG, CHOLHDL, LDLDIRECT in the last 72 hours. Thyroid Function Tests No results for input(s): TSH, T4TOTAL, T3FREE, THYROIDAB in the last 72 hours.  Invalid input(s): FREET3  Other results:   Imaging    No results found.   Medications:     Scheduled Medications: . apixaban  5 mg Oral BID  . carvedilol  3.125 mg Oral BID WC  . dofetilide  125 mcg Oral BID  . dorzolamide  1 drop Both Eyes BID  . furosemide  80 mg Intravenous BID  . latanoprost  1 drop Both Eyes QHS  . metolazone  2.5 mg Oral Daily  . pantoprazole  40 mg Oral BID  . polyethylene glycol  17 g Oral Daily  . rosuvastatin  20 mg Oral Daily  . Tafamidis  61 mg Oral Daily    Infusions: . sodium chloride 250 mL (02/11/19 0035)  . cefTRIAXone (ROCEPHIN)  IV 1 g (02/13/19 0211)  . vancomycin 1,250 mg (02/12/19 2155)    PRN Medications: sodium chloride, acetaminophen **OR** acetaminophen, ondansetron **OR** ondansetron (ZOFRAN) IV, senna-docusate, traMADol    Assessment/Plan  1. 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 Have discussed gene silencer study at [place]  - ECHO 12/07/2018-EF 35-40% RV normal, LA dilated, RA severely dilated. - Volume status stable. Stop IV lasix. Start torsemide 60 mg daily.  -Start K 40 meq twice a day.   2.  LLE Cellulitis  - improving but still erythematous -WBC coming down.  - BCX negative. Antibiotics Per Primary Team.   3. PAF  - Maintaining NSR   - Remain on Tikosyn. K (4.1) and Mag stable.  - QT increased. Will need to follow closely.  - If creatinine bumping or he develops hypokalemia would favor stopping Tikosyn and switching to amio after a washout. He will likely not tolerate AF will with cardiac amyloidosis. Continue tikosyn/eliquis.   4. CKD Stage III  Creatinine baseline 1.3-1.4  -Creatinine stable.   5. Hypokalemia. - supp   Length of Stay: 5  Amy Clegg, NP  02/13/2019, 8:37 AM  Advanced Heart Failure Team Pager [phone] (M-F; 7a - 4p)  Please contact [facility] for night-coverage after hours (4p -7a ) and weekends on [email or web address]  Patient seen and examined with the above-signed Advanced Practice Provider and/or Housestaff. I personally reviewed laboratory data, imaging studies and relevant notes. I independently examined the patient and formulated the important aspects of the plan. I have edited the note to reflect any of my changes or salient points. I have personally discussed the plan with the patient and/or family.  Volume status much improved with IV lasix. Agree with switch to po diuretics. Left leg erythema resolving slowly with IV abx. [facility] managing). If not resolving soon may need imaging. Continue tafamadis for cardiac amyloid.   Remains in NSR on tikosyn. QTc 502. Will need to follow closely. May need to stop Tikosyn soon. Supp K aggressively today. Would not d/c today. Check mag. Discussed dosing with PharmD personally.  Glori Bickers, MD  9:54 AM

## 2019-02-13 NOTE — Progress Notes (Signed)
PROGRESS NOTE  [name] PQD:[id number] DOB: 03/12/1941 DOA: 02/08/2019 PCP: [name], MD  Brief History   Christopher Benton is an 78 y.o. 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 [name] office with complaints of left leg swelling, redness and pain. Patient is a poor historian and wife present at bedside mostly provides history. Patient started having increasing redness and pain in the left leg for the last 2 to 3 days. He also has been having increasing swelling of the left lower extremity for the last few days.  The patient tells me that he has had vascular work up on the right and left lower extremities in the days leading up to admission here. He states that this was done at [facility]. His wife states it was done at [facility]. He also had some vascular study at the [place]. I have asked nursing to get records for these studies.  The patient has been admitted to a telemetry bed. Cardiology has been consulted and he is being diuresed with Lasix 80 mg IV bid. He is receiving IV Ceftriaxone and Vancomycin for his cellulitis. Blood cultures x 2 have been obtained, but have had no growth. Wound care has been consulted to care for the blood filled bulla on his right great toe. Right lower extremity is wrapped.  Consultants  . Heart Failure . Wound Care consult  Procedures  . None  Antibiotics   Anti-infectives (From admission, onward)   Start     Dose/Rate Route Frequency Ordered Stop   02/13/19 1000  cephALEXin (KEFLEX) capsule 500 mg  Status:  Discontinued     500 mg Oral Every 8 hours 02/13/19 0911 02/13/19 1141   02/12/19 2200  vancomycin (VANCOCIN) 1,250 mg in sodium chloride 0.9 % 250 mL IVPB     1,250 mg 166.7 mL/hr over 90 Minutes Intravenous  Every 24 hours 02/12/19 2123     02/09/19 2200  cefTRIAXone (ROCEPHIN) 1 g in sodium chloride 0.9 % 100 mL IVPB  Status:  Discontinued     1 g 200 mL/hr over 30 Minutes Intravenous Every 24 hours 02/08/19 1814 02/13/19 0911   02/09/19 2030  vancomycin (VANCOCIN) 1,500 mg in sodium chloride 0.9 % 500 mL IVPB  Status:  Discontinued     1,500 mg 250 mL/hr over 120 Minutes Intravenous Every 24 hours 02/08/19 2029 02/12/19 2123   02/08/19 1930  cefTRIAXone (ROCEPHIN) 1 g in sodium chloride 0.9 % 100 mL IVPB     1 g 200 mL/hr over 30 Minutes Intravenous STAT 02/08/19 1921 02/09/19 0236   02/08/19 1930  vancomycin (VANCOCIN) 1,750 mg in sodium chloride 0.9 % 500 mL IVPB     1,750 mg 250 mL/hr over 120 Minutes Intravenous  Once 02/08/19 1922 02/08/19 2327     Subjective  The patient is resting comfortably. No new complaints. Left lower extremity remains swollen.  Objective   Vitals:  Vitals:   02/13/19 0859 02/13/19 1340  BP: 110/80 106/88  Pulse: 85 85  Resp: 20 18  Temp: 98.9 F (37.2 C) 97.8 F (36.6 C)  SpO2: 100% 94%    Exam:  Constitutional:  . The patient is awake, alert, and oriented x 3. No acute distress. Respiratory:  . There is no increased work of breathing. . No wheezes, rales, or rhonchi. . No tactile fremitus. Cardiovascular:  .  Regular rate and rhythm. . No murmurs, ectopy, or gallups  . No lateral PMI. No thrills. Abdomen:  . Abdomen is soft, non-tender, non-distended. . No hernias, masses, or organomegaly . Normoactive bowel sounds. Musculoskeletal:  Left lower extremity has markedly decreased swelling. It remains erythematous, warm, and tender to touch. Right lower extremity with without cyanosis, clubbing or edema. Skin:  . There is a blister on his right great toe. Neurologic:  . CN 2-12 intact . Sensation all 4 extremities intact Psychiatric:  . Mental status o Mood, affect appropriate o Orientation to person, place, time  . judgment and  insight appear intact    I have personally reviewed the following:   Today's Data  . Vitals, BMP, Doppler left lower extremity  Micro Data  . Blood Cultures: No growth  Scheduled Meds: . apixaban  5 mg Oral BID  . carvedilol  3.125 mg Oral BID WC  . dofetilide  125 mcg Oral BID  . dorzolamide  1 drop Both Eyes BID  . latanoprost  1 drop Both Eyes QHS  . metolazone  2.5 mg Oral Daily  . pantoprazole  40 mg Oral BID  . polyethylene glycol  17 g Oral Daily  . potassium chloride  40 mEq Oral BID  . rosuvastatin  20 mg Oral Daily  . Tafamidis  61 mg Oral Daily  . torsemide  60 mg Oral Daily   Continuous Infusions: . sodium chloride 250 mL (02/11/19 0035)  . magnesium sulfate bolus IVPB    . vancomycin 1,250 mg (02/12/19 2155)    Principal Problem:   Cellulitis Active Problems:   Essential hypertension   PAF (paroxysmal atrial fibrillation) (HCC)   CAD (coronary artery disease)   Amyloidosis (HCC)   Acute on chronic combined systolic and diastolic CHF (congestive heart failure) (HCC)   CKD (chronic kidney disease), stage III ([place])   LOS: 5 days   A & P  Left lower extremity cellulitis: Appears improved with reduced swelling and no warmth. Still a little red. Monitor. The patient has received Rocephin and Vancomycin. Blood cultures have had no growth to date. WBC's trending down. The patient tells me that he has had vascular work up on the right and left lower extremities in the days leading up to admission here. He states that this was done at [facility]. His wife states it was done at [facility]. He also had some vascular study at the [place]. I have asked nursing to get records for these studies. Continue IV antibiotics until erythema and warmth subside.  Acute on chronic combined systolic and diastolic heart failure: Echo in July 2020 had shownEF of 35-40 % with biatrial enlargement. CHF team: Lasix 80 mg IV daily has been restarted. He is on a 1500 cc fluid restriction. Fluid  balance remains positive by 4 liters.   Amyloidosis: Continue tafamidis. Outpatient follow-up.  Paroxysmal A. Fib: Currently in sinus rhythm. management per cardiology. If creatinine bumping or he develops hypokalemia would favor stopping Tikosyn and switching to [facility] after a washout. He will likely not tolerate AF will with cardiac amyloidosis.The patient will be continued on Tikosyn and Eliquis.  Hypertension: Monitor blood pressure. Good control on carvedilol.  Chronic renal disease stage III: Avoid nephrotoxic substances and hypotension. Monitor creatinine, electrolytes, and volume status. Creatinien 1.48 today.  Coronary artery disease: Diagnosed previously based on CT scan showing coronary calcification. Continue statin. No aspirin as patient is on Eliquis.  Chronic right great toe ulcer: Present on admission. Wound  care consulted. They have recommended:  Cleanse right great toe with NS and pat dry. Apply Xeroform gauze to wound bed. Cover with dry gauze and tape. Change daily.   GERD: Increase PPI to BID for now  Obesity: Noted BMI 32.08. noted.  I have seen and examined this patient myself. I have spent 35 minuets in his evaluation and care.  Family Communication/Anticipated D/C date and plan/Code Status   DVT prophylaxis: eliquis Code Status: Full Code.  Family Communication: Wife is at bedside. Disposition Plan: Home  Lina Hitch, DO [facility] Direct contact: see [email or web address]  7PM-7AM contact night coverage as above 02/13/2019, 2:48 PM  LOS: 2 days

## 2019-02-13 NOTE — TOC Progression Note (Addendum)
Transition of Care [facility]) - Progression Note    Patient Details  Name: Christopher Benton MRN: [id number] Date of Birth: April 05, 1941  [facility] CM/SW Contact  Zenon Mayo, RN Phone Number: 02/13/2019, 1:05 PM  Clinical Narrative:    From home with wife, he goes to the [facility], PCP is Dr. Ishmael Holter, Filbert Berthold is Cards MD, goal is to get well. He has crutches /cane at home.  He will go to [facility] to get medications. Patient does not want a HHRN .         Expected Discharge Plan and Services           Expected Discharge Date: 02/10/19                                     Social Determinants of Health (SDOH) Interventions    Readmission Risk Interventions No flowsheet data found.

## 2019-02-14 ENCOUNTER — Inpatient Hospital Stay (HOSPITAL_COMMUNITY): Payer: No Typology Code available for payment source

## 2019-02-14 LAB — CBC
HCT: 46.5 % (ref 39.0–52.0)
Hemoglobin: 15.4 g/dL (ref 13.0–17.0)
MCH: 28.3 pg (ref 26.0–34.0)
MCHC: 33.1 g/dL (ref 30.0–36.0)
MCV: 85.5 fL (ref 80.0–100.0)
Platelets: 270 10*3/uL (ref 150–400)
RBC: 5.44 MIL/uL (ref 4.22–5.81)
RDW: 12.8 % (ref 11.5–15.5)
WBC: 10.5 10*3/uL (ref 4.0–10.5)
nRBC: 0 % (ref 0.0–0.2)

## 2019-02-14 LAB — BASIC METABOLIC PANEL
Anion gap: 13 (ref 5–15)
BUN: 14 mg/dL (ref 8–23)
CO2: 29 mmol/L (ref 22–32)
Calcium: 9.2 mg/dL (ref 8.9–10.3)
Chloride: 94 mmol/L — ABNORMAL LOW (ref 98–111)
Creatinine, Ser: 1.48 mg/dL — ABNORMAL HIGH (ref 0.61–1.24)
GFR calc Af Amer: 52 mL/min — ABNORMAL LOW (ref >60)
GFR calc non Af Amer: 45 mL/min — ABNORMAL LOW (ref >60)
Glucose, Bld: 100 mg/dL — ABNORMAL HIGH (ref 70–99)
Potassium: 3.9 mmol/L (ref 3.5–5.1)
Sodium: 136 mmol/L (ref 135–145)

## 2019-02-14 LAB — MAGNESIUM: Magnesium: 2.3 mg/dL (ref 1.7–2.4)

## 2019-02-14 MED ORDER — DOXYCYCLINE HYCLATE 100 MG PO TABS
100.0000 mg | ORAL_TABLET | Freq: Two times a day (BID) | ORAL | Status: DC
  Administered 2019-02-14 – 2019-02-15 (×3): 100 mg via ORAL
  Filled 2019-02-14 (×3): qty 1

## 2019-02-14 NOTE — Progress Notes (Addendum)
Advanced Heart Failure Rounding Note  PCP-Cardiologist: Kirk Ruths, MD   Subjective:    Admitted with severe LLE cellulitis and possible volume overload. Remains on antibiotics.   Yesterday switched to torsemide.   Feeling much better. Says he is not having much pain in his LLE.     Objective:   Weight Range: 99.4 kg Body mass index is 30.57 kg/m.   Vital Signs:   Temp:  [97.8 F (36.6 C)-98.4 F (36.9 C)] 98.4 F (36.9 C) (09/15 0621) Pulse Rate:  [80-90] 80 (09/15 0849) Resp:  [14-18] 15 (09/15 0621) BP: (99-107)/(59-88) 107/78 (09/15 0849) SpO2:  [94 %-97 %] 95 % (09/15 0621) Weight:  [99.4 kg] 99.4 kg (09/15 0500) Last BM Date: 02/12/19  Weight change: Filed Weights   02/12/19 0409 02/13/19 0438 02/14/19 0500  Weight: 105.2 kg 101.3 kg 99.4 kg    Intake/Output:   Intake/Output Summary (Last 24 hours) at 02/14/2019 0935 Last data filed at 02/14/2019 0811 Gross per 24 hour  Intake 1553.69 ml  Output 2750 ml  Net -1196.31 ml      Physical Exam    General:  Well appearing. No resp difficulty HEENT: normal anicteric Neck: supple. JVP 7-8 . Carotids 2+ bilat; no bruits. No lymphadenopathy or thryomegaly appreciated. Cor: PMI nondisplaced. Regular rate & rhythm. No rubs, gallops or murmurs. Lungs: clear No wheeze Abdomen: soft, nontender, nondistended. No hepatosplenomegaly. No bruits or masses. Good bowel sounds. Extremities: no cyanosis, clubbing, rash, edema. LLE mild erythema and tenderness to palpation  Neuro: alert & oriented x 3, cranial nerves grossly intact. moves all 4 extremities w/o difficulty. Affect pleasant    Telemetry  Sr 60-70s Personally reviewed    Labs    CBC Recent Labs    02/12/19 0020 02/13/19 0631 02/14/19 0719  WBC 12.1* 11.6* 10.5  NEUTROABS 8.4* 8.5*  --   HGB 15.3 15.2 15.4  HCT 47.7 47.5 46.5  MCV 86.7 86.4 85.5  PLT 187 271 294   Basic Metabolic Panel Recent Labs    02/13/19 0631 02/13/19 1510  02/14/19 0719  NA 133* 133* 136  K 3.1* 4.5 3.9  CL 93* 92* 94*  CO2 29 30 29   GLUCOSE 91 131* 100*  BUN 14 14 14   CREATININE 1.24 1.49* 1.48*  CALCIUM 8.7* 9.4 9.2  MG 1.8  --  2.3   Liver Function Tests No results for input(s): AST, ALT, ALKPHOS, BILITOT, PROT, ALBUMIN in the last 72 hours. No results for input(s): LIPASE, AMYLASE in the last 72 hours. Cardiac Enzymes No results for input(s): CKTOTAL, CKMB, CKMBINDEX, TROPONINI in the last 72 hours.  BNP: BNP (last 3 results) Recent Labs    12/08/18 1042 12/19/18 1526 01/30/19 1257  BNP 720.6* 811.3* 706.0*    ProBNP (last 3 results) No results for input(s): PROBNP in the last 8760 hours.   D-Dimer No results for input(s): DDIMER in the last 72 hours. Hemoglobin A1C No results for input(s): HGBA1C in the last 72 hours. Fasting Lipid Panel No results for input(s): CHOL, HDL, LDLCALC, TRIG, CHOLHDL, LDLDIRECT in the last 72 hours. Thyroid Function Tests No results for input(s): TSH, T4TOTAL, T3FREE, THYROIDAB in the last 72 hours.  Invalid input(s): FREET3  Other results:   Imaging    No results found.   Medications:     Scheduled Medications:  apixaban  5 mg Oral BID   carvedilol  3.125 mg Oral BID WC   dofetilide  125 mcg Oral BID  dorzolamide  1 drop Both Eyes BID   latanoprost  1 drop Both Eyes QHS   metolazone  2.5 mg Oral Daily   pantoprazole  40 mg Oral BID   polyethylene glycol  17 g Oral Daily   potassium chloride  40 mEq Oral BID   rosuvastatin  20 mg Oral Daily   Tafamidis  61 mg Oral Daily   torsemide  60 mg Oral Daily    Infusions:  sodium chloride 250 mL (02/13/19 2055)   vancomycin 1,250 mg (02/13/19 2056)    PRN Medications: sodium chloride, acetaminophen **OR** acetaminophen, ondansetron **OR** ondansetron (ZOFRAN) IV, senna-docusate, traMADol    Assessment/Plan  1. 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  Have discussed gene silencer study at [facility]  - ECHO 12/07/2018-EF 35-40% RV normal, LA dilated, RA severely dilated. - Volume status stable. Continue torsemide 60 mg daily.  -Continue K 40 meq twice a day.   2.  LLE Cellulitis  - improving but still erythematous -WBC normalized.   - BCX negative. Antibiotics Per Primary Team.   3. PAF  - Maintaining NSR   - Remain on Tikosyn. K 3.9   - QT increased. Will need to follow closely.  - If creatinine bumping or he develops hypokalemia would favor stopping Tikosyn and switching to amio after a washout. He will likely not tolerate AF will with cardiac amyloidosis. Continue tikosyn/eliquis.   4. CKD Stage III  Creatinine baseline 1.3-1.4  -Creatinine stable.   5. Hypokalemia. -Stable.   HF Meds for d/c  Torsemide 60 mg daily K 40 meq twice a day.  Eliquis 5 mg twice a day  Carvedilol 3.125 mg twice a day  Tikosyn 125 mcg twice a day  Crestor 20 mg daily  Tafamidis 61 mg daily   HF follow has been set up.     Length of Stay: 6  Amy Clegg, NP  02/14/2019, 9:35 AM  Advanced Heart Failure Team Pager [phone] (M-F; [place])  Please contact [facility] for night-coverage after hours (4p -7a ) and weekends on [email or web address]   Patient seen and examined with the above-signed Advanced Practice Provider and/or Housestaff. I personally reviewed laboratory data, imaging studies and relevant notes. I independently examined the patient and formulated the important aspects of the plan. I have edited the note to reflect any of my changes or salient points. I have personally discussed the plan with the patient and/or family.  Volume status looks good. Now on po torsemide. LLE cellulitis is improved but still erythematous and not healing as fast as I would have expected. Will get CT of LLE to further evaluate.   Continue tafamdis for cardiac amyloid.   Glori Bickers, MD  5:23 PM

## 2019-02-14 NOTE — Progress Notes (Addendum)
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 [name] office with complaints of left leg swelling, redness and pain.   Work up for cellulitis and acute combined HF.    PT Comments    Pt slow to fully arouse and participate asking wife for assistance with his sock and pants with pt encouraged to perform as much mobility and dressing on his own with wife aware. Pt with ability to walk long hall distance without AD and maintain SpO2 92-97% on RA throughout gait this session with HR 73-90. Pt able to perform all basic transfers and gait without assist and has met goals for therapy. No further therapy needs at this time with pt educated for need to walk daily acutely and continues to perform iADLs. Will sign off with pt and wife aware and agreeable.    Follow Up Recommendations  No PT follow up     Equipment Recommendations  None recommended by PT    Recommendations for Other Services       Precautions / Restrictions Precautions Precautions: Fall    Mobility  Bed Mobility Overal bed mobility: Modified Independent             General bed mobility comments: HOB 20 degrees with rail  Transfers Overall transfer level: Modified independent                  Ambulation/Gait Ambulation/Gait assistance: Independent Gait Distance (Feet): 600 Feet Assistive device: None Gait Pattern/deviations: Step-through pattern   Gait velocity interpretation: >4.37 ft/sec, indicative of normal walking speed General Gait Details: Pt able to walk long hall distance without AD with good  stability and maintained 92-97% on RA   Stairs             Wheelchair Mobility    Modified Rankin (Stroke Patients Only)       Balance Overall balance assessment: Mild deficits observed, not formally tested                                          Cognition Arousal/Alertness: Awake/alert Behavior During Therapy: WFL for tasks assessed/performed Overall Cognitive Status: Impaired/Different from baseline Area of Impairment: Problem solving                             Problem Solving: Slow processing General Comments: pt again asleep on arrival with increased time to process and fully participate in session. Wife present and does state this is different than normal      Exercises General Exercises - Lower Extremity Long Arc Quad: AROM;Both;Seated;10 reps Hip Flexion/Marching: AROM;Both;Seated;10 reps    General Comments        Pertinent Vitals/Pain Pain Score: 6  Pain Location: LLE Pain Descriptors / Indicators: Aching;Sore Pain Intervention(s): Limited activity within patient's tolerance;Monitored during session;RN gave pain meds during session;Repositioned    Home Living                      Prior Function  PT Goals (current goals can now be found in the care plan section) Progress towards PT goals: Goals met/education completed, patient discharged from PT    Frequency           PT Plan Current plan remains appropriate    Co-evaluation              AM-PAC PT "6 Clicks" Mobility   Outcome Measure  Help needed turning from your back to your side while in a flat bed without using bedrails?: None Help needed moving from lying on your back to sitting on the side of a flat bed without using bedrails?: None Help needed moving to and from a bed to a chair (including a wheelchair)?: None Help needed standing up from a chair using your arms (e.g., wheelchair or bedside chair)?: None Help needed to  walk in hospital room?: None Help needed climbing 3-5 steps with a railing? : None 6 Click Score: 24    End of Session   Activity Tolerance: Patient tolerated treatment well Patient left: in bed;with call bell/phone within reach;with family/visitor present Nurse Communication: Mobility status PT Visit Diagnosis: Other abnormalities of gait and mobility (R26.89)     Time: 6196-9409 PT Time Calculation (min) (ACUTE ONLY): 32 min  Charges:  $Gait Training: 8-22 mins $Therapeutic Activity: 8-22 mins                     [place] Pager: [phone] Office: [place] 02/14/2019, 2:01 PM

## 2019-02-14 NOTE — Progress Notes (Signed)
PROGRESS NOTE  Christopher Benton:[id number] DOB: 06-29-1940 DOA: 02/08/2019 PCP: Christopher Darling, MD  Brief History   Christopher Benton is an 78 y.o. 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 Jacalyn Benton office with complaints of left leg swelling, redness and pain. Patient is a poor historian and wife present at bedside mostly provides history. Patient started having increasing redness and pain in the left leg for the last 2 to 3 days. He also has been having increasing swelling of the left lower extremity for the last few days.  The patient tells me that he has had vascular work up on the right and left lower extremities in the days leading up to admission here. He states that this was done at [facility]. His wife states it was done at [facility]. He also had some vascular study at the [place]. I have asked nursing to get records for these studies.  The patient has been admitted to a telemetry bed. Cardiology has been consulted and he is being diuresed with Lasix 80 mg IV bid. He is receiving IV Ceftriaxone and Vancomycin for his cellulitis. Blood cultures x 2 have been obtained, but have had no growth. Wound care has been consulted to care for the blood filled bulla on his right great toe.   Consultants  . Heart Failure . Wound Care consult  Procedures  . None  Antibiotics   Anti-infectives (From admission, onward)   Start     Dose/Rate Route Frequency Ordered Stop   02/14/19 1000  doxycycline (VIBRA-TABS) tablet 100 mg     100 mg Oral Every 12 hours 02/14/19 0950     02/13/19 1000  cephALEXin (KEFLEX) capsule 500 mg  Status:  Discontinued     500 mg Oral Every 8 hours 02/13/19 0911 02/13/19 1141   02/12/19 2200  vancomycin (VANCOCIN) 1,250 mg in sodium chloride  0.9 % 250 mL IVPB  Status:  Discontinued     1,250 mg 166.7 mL/hr over 90 Minutes Intravenous Every 24 hours 02/12/19 2123 02/14/19 0951   02/09/19 2200  cefTRIAXone (ROCEPHIN) 1 g in sodium chloride 0.9 % 100 mL IVPB  Status:  Discontinued     1 g 200 mL/hr over 30 Minutes Intravenous Every 24 hours 02/08/19 1814 02/13/19 0911   02/09/19 2030  vancomycin (VANCOCIN) 1,500 mg in sodium chloride 0.9 % 500 mL IVPB  Status:  Discontinued     1,500 mg 250 mL/hr over 120 Minutes Intravenous Every 24 hours 02/08/19 2029 02/12/19 2123   02/08/19 1930  cefTRIAXone (ROCEPHIN) 1 g in sodium chloride 0.9 % 100 mL IVPB     1 g 200 mL/hr over 30 Minutes Intravenous STAT 02/08/19 1921 02/09/19 0236   02/08/19 1930  vancomycin (VANCOCIN) 1,750 mg in sodium chloride 0.9 % 500 mL IVPB     1,750 mg 250 mL/hr over 120 Minutes Intravenous  Once 02/08/19 1922 02/08/19 2327     Subjective  The patient is resting comfortably. No new complaints. Left lower extremity remains swollen.  Objective   Vitals:  Vitals:   02/14/19 1156 02/14/19 1357  BP: 108/61   Pulse: 78 73  Resp: 18   Temp: 98.2 F (36.8 C)   SpO2: 94% 92%    Exam:  Constitutional:  . The patient is awake, alert, and oriented x 3. No acute distress.  Respiratory:  . There is no increased work of breathing. . No wheezes, rales, or rhonchi. . No tactile fremitus. Cardiovascular:  . Regular rate and rhythm. . No murmurs, ectopy, or gallups  . No lateral PMI. No thrills. Abdomen:  . Abdomen is soft, non-tender, non-distended. . No hernias, masses, or organomegaly . Normoactive bowel sounds. Musculoskeletal:  Left lower extremity has markedly decreased swelling. It is less erythematous and warm. It is not tender to touch. Right lower extremity with without cyanosis, clubbing or edema. Skin:  . There is a blister on his right great toe. Neurologic:  . CN 2-12 intact . Sensation all 4 extremities intact Psychiatric:  . Mental  status o Mood, affect appropriate o Orientation to person, place, time  . judgment and insight appear intact    I have personally reviewed the following:   Today's Data  . Vitals, BMP, Doppler left lower extremity  Micro Data  . Blood Cultures: No growth  Scheduled Meds: . apixaban  5 mg Oral BID  . carvedilol  3.125 mg Oral BID WC  . dofetilide  125 mcg Oral BID  . dorzolamide  1 drop Both Eyes BID  . doxycycline  100 mg Oral Q12H  . latanoprost  1 drop Both Eyes QHS  . pantoprazole  40 mg Oral BID  . polyethylene glycol  17 g Oral Daily  . potassium chloride  40 mEq Oral BID  . rosuvastatin  20 mg Oral Daily  . Tafamidis  61 mg Oral Daily  . torsemide  60 mg Oral Daily   Continuous Infusions: . sodium chloride 250 mL (02/13/19 2055)    Principal Problem:   Cellulitis Active Problems:   Essential hypertension   PAF (paroxysmal atrial fibrillation) (HCC)   CAD (coronary artery disease)   Amyloidosis (HCC)   Acute on chronic combined systolic and diastolic CHF (congestive heart failure) (HCC)   CKD (chronic kidney disease), stage III ([place])   LOS: 6 days   A & P  Left lower extremity cellulitis: Appears improved with reduced swelling and no warmth. Still a little red. Monitor. The patient has received Rocephin and Vancomycin. Blood cultures have had no growth to date. WBC's trending down. The patient tells me that he has had vascular work up on the right and left lower extremities in the days leading up to admission here. He states that this was done at [facility]. His wife states it was done at [facility]. He also had some vascular study at the [place]. I have asked nursing to get records for these studies. As erythema and warmth appear improved, I will change convert the patient to oral antibiotics and anticipate possible discharge tomorrow if the leg appears stable on PO antibiotics.  Acute on chronic combined systolic and diastolic heart failure: Echo in July 2020 had shownEF  of 35-40 % with biatrial enlargement. CHF team: Lasix 80 mg IV daily has been restarted. He is on a 1500 cc fluid restriction. Fluid balance remains negative by 5.7 liters. Monitor.  Amyloidosis: Continue tafamidis. Outpatient follow-up.  Paroxysmal A. Fib: Currently in sinus rhythm. management per cardiology. If creatinine bumping or he develops hypokalemia would favor stopping Tikosyn and switching to [facility] after a washout. He will likely not tolerate AF will with cardiac amyloidosis.The patient will be continued on Tikosyn and Eliquis.  Hypertension: Monitor blood pressure. Good control on carvedilol.  Chronic renal disease stage III: Avoid nephrotoxic substances and hypotension. Monitor creatinine, electrolytes, and volume status. Creatinine 1.48 today.  Coronary artery disease: Diagnosed previously based on CT scan showing coronary calcification. Continue statin. No aspirin as patient is on Eliquis.  Chronic right great toe ulcer: Present on admission. Wound care consulted. They have recommended:  Cleanse right great toe with NS and pat dry. Apply Xeroform gauze to wound bed. Cover with dry gauze and tape. Change daily.   GERD: Increase PPI to BID for now  Obesity: Noted BMI 32.08. noted.  I have seen and examined this patient myself. I have spent 30 minuets in his evaluation and care.  Family Communication/Anticipated D/C date and plan/Code Status   DVT prophylaxis: eliquis Code Status: Full Code.  Family Communication: Wife is at bedside. Disposition Plan: Home  Sybella Harnish, DO [facility] Direct contact: see [email or web address]  7PM-7AM contact night coverage as above 02/14/2019, 3:31 PM  LOS: 2 days

## 2019-02-15 DIAGNOSIS — E852 Heredofamilial amyloidosis, unspecified: Secondary | ICD-10-CM

## 2019-02-15 LAB — BASIC METABOLIC PANEL
Anion gap: 11 (ref 5–15)
BUN: 16 mg/dL (ref 8–23)
CO2: 28 mmol/L (ref 22–32)
Calcium: 8.9 mg/dL (ref 8.9–10.3)
Chloride: 96 mmol/L — ABNORMAL LOW (ref 98–111)
Creatinine, Ser: 1.32 mg/dL — ABNORMAL HIGH (ref 0.61–1.24)
GFR calc Af Amer: 59 mL/min — ABNORMAL LOW (ref >60)
GFR calc non Af Amer: 51 mL/min — ABNORMAL LOW (ref >60)
Glucose, Bld: 92 mg/dL (ref 70–99)
Potassium: 3.4 mmol/L — ABNORMAL LOW (ref 3.5–5.1)
Sodium: 135 mmol/L (ref 135–145)

## 2019-02-15 LAB — MAGNESIUM: Magnesium: 1.9 mg/dL (ref 1.7–2.4)

## 2019-02-15 MED ORDER — MAGNESIUM SULFATE 2 GM/50ML IV SOLN
2.0000 g | Freq: Once | INTRAVENOUS | Status: AC
  Administered 2019-02-15: 2 g via INTRAVENOUS
  Filled 2019-02-15: qty 50

## 2019-02-15 MED ORDER — DOXYCYCLINE HYCLATE 100 MG PO TABS: 100.0000 mg | ORAL_TABLET | Freq: Two times a day (BID) | ORAL | 0 refills | Status: AC

## 2019-02-15 MED ORDER — POTASSIUM CHLORIDE CRYS ER 20 MEQ PO TBCR
40.0000 meq | EXTENDED_RELEASE_TABLET | Freq: Two times a day (BID) | ORAL | Status: DC
  Administered 2019-02-15: 40 meq via ORAL
  Filled 2019-02-15: qty 2

## 2019-02-15 MED ORDER — POTASSIUM CHLORIDE CRYS ER 20 MEQ PO TBCR
40.0000 meq | EXTENDED_RELEASE_TABLET | ORAL | Status: AC
  Administered 2019-02-15: 40 meq via ORAL
  Filled 2019-02-15: qty 2

## 2019-02-15 MED ORDER — POLYETHYLENE GLYCOL 3350 17 G PO PACK: 17.0000 g | PACK | Freq: Every day | ORAL | 0 refills | Status: DC

## 2019-02-15 NOTE — Progress Notes (Addendum)
Primary team aware about AFib, CHF team paged as well. HR in 100's.  No new orders.

## 2019-02-15 NOTE — Progress Notes (Signed)
EKG ordered due to pateint probably being back to AFIB. EKG done and confirms AFib. MD Michiel Cowboy notified. Patient resting comfortably.

## 2019-02-15 NOTE — TOC Progression Note (Signed)
Transition of Care [facility]) - Progression Note    Patient Details  Name: Christopher Benton MRN: [id number] Date of Birth: 03/21/1941  [facility] of [facility]) CM/SW Contact  Zenon Mayo, RN Phone Number: 02/15/2019, 2:44 PM  Clinical Narrative:    Form home wife, goes to [facility] [address]. Oletta Darter is PCP, CSW is Kellie Simmering, [phone].  He will go to [place] to get medications.          Expected Discharge Plan and Services           Expected Discharge Date: 02/15/19                                     Social Determinants of Health (SDOH) Interventions    Readmission Risk Interventions No flowsheet data found.

## 2019-02-15 NOTE — Progress Notes (Signed)
Patient has order for daily dressing changes. Patient stated that he likes his wife to do that for him because she is doing it at home. Patient stated that wound care has been done last night at 9 pm. Patent wants  his wife do the same thing for him today. Assessed wound on right toe and dressing that was clean. Notified patient to call when they are ready to make sure they have all supplies at beside. Patient stated it is going to be probably again at 9 pm today.

## 2019-02-15 NOTE — Progress Notes (Addendum)
Advanced Heart Failure Rounding Note  PCP-Cardiologist: Kirk Ruths, MD   Subjective:    Admitted with severe LLE cellulitis and possible volume overload. Remains on antibiotics.   CT LLE ok. No gas or fluid collection to be drained.  Weight down another 3 pounds.     Denies SOB. Having ongoing left lower extremity pain.      Objective:   Weight Range: 98.4 kg Body mass index is 30.25 kg/m.   Vital Signs:   Temp:  [98 F (36.7 C)-98.3 F (36.8 C)] 98.3 F (36.8 C) (09/16 0531) Pulse Rate:  [73-108] 108 (09/16 0531) Resp:  [17-18] 17 (09/16 0531) BP: (104-110)/(61-78) 110/78 (09/16 0531) SpO2:  [92 %-100 %] 96 % (09/16 0531) Weight:  [98.4 kg] 98.4 kg (09/16 0538) Last BM Date: 02/14/19  Weight change: Filed Weights   02/13/19 0438 02/14/19 0500 02/15/19 0538  Weight: 101.3 kg 99.4 kg 98.4 kg    Intake/Output:   Intake/Output Summary (Last 24 hours) at 02/15/2019 0851 Last data filed at 02/15/2019 0300 Gross per 24 hour  Intake 720 ml  Output 2825 ml  Net -2105 ml      Physical Exam    General:  Well appearing. No resp difficulty. In bed.  HEENT: normal Neck: supple. JVD 6-7 . Carotids 2+ bilat; no bruits. No lymphadenopathy or thryomegaly appreciated. Cor: PMI nondisplaced. Irregular rate & rhythm. No rubs, gallops or murmurs. Lungs: clear Abdomen: soft, nontender, nondistended. No hepatosplenomegaly. No bruits or masses. Good bowel sounds. Extremities: no cyanosis, clubbing, rash, LLE erythema edema Neuro: alert & orientedx3, cranial nerves grossly intact. moves all 4 extremities w/o difficulty. Affect pleasant   Telemetry   A fib 70-80s - Obtain EKG now.   Labs    CBC Recent Labs    02/13/19 0631 02/14/19 0719  WBC 11.6* 10.5  NEUTROABS 8.5*  --   HGB 15.2 15.4  HCT 47.5 46.5  MCV 86.4 85.5  PLT 271 774   Basic Metabolic Panel Recent Labs    02/14/19 0719 02/15/19 0450  NA 136 135  K 3.9 3.4*  CL 94* 96*  CO2 29 28   GLUCOSE 100* 92  BUN 14 16  CREATININE 1.48* 1.32*  CALCIUM 9.2 8.9  MG 2.3 1.9   Liver Function Tests No results for input(s): AST, ALT, ALKPHOS, BILITOT, PROT, ALBUMIN in the last 72 hours. No results for input(s): LIPASE, AMYLASE in the last 72 hours. Cardiac Enzymes No results for input(s): CKTOTAL, CKMB, CKMBINDEX, TROPONINI in the last 72 hours.  BNP: BNP (last 3 results) Recent Labs    12/08/18 1042 12/19/18 1526 01/30/19 1257  BNP 720.6* 811.3* 706.0*    ProBNP (last 3 results) No results for input(s): PROBNP in the last 8760 hours.   D-Dimer No results for input(s): DDIMER in the last 72 hours. Hemoglobin A1C No results for input(s): HGBA1C in the last 72 hours. Fasting Lipid Panel No results for input(s): CHOL, HDL, LDLCALC, TRIG, CHOLHDL, LDLDIRECT in the last 72 hours. Thyroid Function Tests No results for input(s): TSH, T4TOTAL, T3FREE, THYROIDAB in the last 72 hours.  Invalid input(s): FREET3  Other results:   Imaging    Ct Tibia Fibula Left Wo Contrast  Result Date: 02/15/2019 CLINICAL DATA:  Left lower extremity pain and swelling EXAM: CT OF THE LOWER LEFT EXTREMITY WITHOUT CONTRAST TECHNIQUE: Multidetector CT imaging of the lower left extremity was performed according to the standard protocol. COMPARISON:  None. FINDINGS: Bones/Joint/Cartilage No acute fracture. No dislocation.  Subchondral cystic changes of the patella and opposing trochlea favored degenerative. Partially visualized small left knee joint effusion. No cortical destruction or periostitis. No bone lesion. Ligaments Suboptimally assessed by CT. Muscles and Tendons Preserved muscle bulk without significant atrophy or fatty infiltration. No intramuscular fluid collections. Extensive mineralization within the Achilles tendon which is moderately thickened. Tendinous structures appear intact. Soft tissues Circumferential edema within the superficial soft tissues. Fluid tracks along the  superficial fascia overlying the lower leg musculature, greatest anteriorly at the level of the proximal tibia and fibula. No well-defined or drainable fluid collections. No deep fascial fluid. No soft tissue gas. IMPRESSION: 1. Nonspecific circumferential edema and fluid within the superficial soft tissues of the left lower extremity. Findings can be seen in fluid overload states as well as cellulitis. No well-defined or drainable fluid collections. No soft tissue gas. 2. There is a small left knee joint effusion and cystic changes of the patella and trochlear articular surface, which are favored degenerative. Consider arthrocentesis if clinical concern for septic joint. 3. Otherwise, no suspicious osseous findings including cortical erosion or periostitis to suggest acute osteomyelitis. Electronically Signed   By: Davina Poke M.D.   On: 02/15/2019 08:13     Medications:     Scheduled Medications: . apixaban  5 mg Oral BID  . carvedilol  3.125 mg Oral BID WC  . dofetilide  125 mcg Oral BID  . dorzolamide  1 drop Both Eyes BID  . doxycycline  100 mg Oral Q12H  . latanoprost  1 drop Both Eyes QHS  . pantoprazole  40 mg Oral BID  . polyethylene glycol  17 g Oral Daily  . potassium chloride  40 mEq Oral BID  . potassium chloride  40 mEq Oral NOW  . rosuvastatin  20 mg Oral Daily  . Tafamidis  61 mg Oral Daily  . torsemide  60 mg Oral Daily    Infusions: . sodium chloride 250 mL (02/13/19 2055)  . magnesium sulfate bolus IVPB      PRN Medications: sodium chloride, acetaminophen **OR** acetaminophen, ondansetron **OR** ondansetron (ZOFRAN) IV, senna-docusate, traMADol    Assessment/Plan  1. 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 Have discussed gene silencer study at [facility]  - ECHO 12/07/2018-EF 35-40% RV normal, LA dilated, RA severely dilated. - Volume status stable. Weight continues to trend down.  -   Continue torsemide 60 mg daily.  -Continue K 40 meq twice a day. Give extra 40 meq K now.   2.  LLE Cellulitis  - improving but still erythematous. CT soft tissue gas.  -WBC normalized. Switched to doxycycline.    - BCX negative. Antibiotics Per Primary Team.   3. PAF  Check EKG. Looks like he is back in A fib.   - Remain on Tikosyn. K 3.4  - QT increased. Will need to follow closely.  - If creatinine bumping or he develops hypokalemia would favor stopping Tikosyn and switching to amio after a washout. He will likely not tolerate AF will with cardiac amyloidosis. Continue tikosyn/eliquis.   4. CKD Stage III  Creatinine baseline 1.3-1.4  - Creatinine 1.32   5. Hypokalemia. -K 3.4 Supplement K.   EKG now. Looks like he is back in A Fib.   HF follow has been set up.   Length of Stay: 7  Darrick Grinder, NP  02/15/2019, 8:51 AM  Advanced Heart Failure Team Pager [phone] (  M-F; 7a - 4p)  Please contact [facility] for night-coverage after hours (4p -7a ) and weekends on [email or web address]  EKG confirmed A fib. Plan to follow up in HF clinic next week with EKG and BMET. If he is still in a fib we will set up cardioversion.     Torsemide 60 mg daily K 40 meq twice a day.  Eliquis 5 mg twice a day  Carvedilol 3.125 mg twice a day  Tikosyn 125 mcg twice a day  Crestor 20 mg daily  Tafamidis 61 mg daily   Amy Clegg NP-C  2:00 PM   Patient seen and examined with the above-signed Advanced Practice Provider and/or Housestaff. I personally reviewed laboratory data, imaging studies and relevant notes. I independently examined the patient and formulated the important aspects of the plan. I have edited the note to reflect any of my changes or salient points. I have personally discussed the plan with the patient and/or family.  Looks great from HF perspective. Back in AF but good rate control. Asymptomatic. Suspect he may convert back on tikosyn LLE still erythematous but CT negative for  deep infection, On doxy per primary team.   Ok for d/c today from our perspective. Will see back next week with BMET. If still in AF can consider DC-CV.   Glori Bickers, MD  3:20 PM

## 2019-02-15 NOTE — Progress Notes (Signed)
Pharmacy out of the home medication Tafamidis, wife came and provided with dose for today. Medication given.

## 2019-02-15 NOTE — Progress Notes (Signed)
Patient discharged: Home with family  Via: Wheelchair   Discharge paperwork given: to patient and family  Reviewed with teach back  IV and telemetry disconnected  Belongings given to patient    

## 2019-02-16 NOTE — Discharge Summary (Signed)
Physician Discharge Summary  Christopher Benton CWC:[id number] DOB: [birth year] DOA: 02/08/2019  PCP: [name], MD  Admit date: 02/08/2019 Discharge date: 02/15/2019  Time spent: 35 minutes  Recommendations for Outpatient Follow-up:  1. Follow-up with vascular at [facility] 2. Cardiology Dr. Haroldine Laws in 1 week with labs at the CHF clinic   Discharge Diagnoses:  Principal Problem:   Cellulitis Acute on chronic systolic and diastolic CHF   Essential hypertension   PAF (paroxysmal atrial fibrillation) (HCC)   CAD (coronary artery disease)   Amyloidosis (HCC)   Acute on chronic combined systolic and diastolic CHF (congestive heart failure) ([place])   CKD (chronic kidney disease), stage III ([place])   Discharge Condition: Stable  Diet recommendation: Low-sodium heart healthy  Filed Weights   02/13/19 0438 02/14/19 0500 02/15/19 0538  Weight: 101.3 kg 99.4 kg 98.4 kg    History of present illness:  Christopher Saab Ledbetteris an 78 y.o.malewith medical history significant ofparoxysmal atrial fibrillation/flutter on Eliquis, amyloid heart disease, 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 [name] office with complaints of left leg swelling, redness and pain. Patient is a poor historian and wife present at bedside mostly provides history. Patient started having increasing redness and pain in the left leg for the last 2 to 3 days. He also has been having increasing swelling of the left lower extremity for the last few days  Hospital Course:   Left lower extremity cellulitis: -Clinically improving with IV ceftriaxone and vancomycin, leukocytosis has resolved, swelling has improved however still mild swelling persists -Venous duplex negative for DVT -CT scan was also negative for osteomyelitis or deeper abscess -Ambulating much better now with improvement in cellulitis,  discharged him home in a stable condition to continue oral doxycycline for 5 more days, advised to keep his left leg elevated as much as possible  -Follow-up with PCP in 1 week  Acute on chronic combined systolic and diastolic heart failure: Echo in July 2020 had shownEF of 35-40 % with biatrial enlargement -Followed by CHF team, diuresed with IV Lasix with good effect -Transition back to home regimen of torsemide 60 mg daily, Coreg and potassium -Follow-up with CHF team in 1 week with labs  Cardiac amyloidosis: Continue tafamidis. Outpatient follow-up.  Paroxysmal A. Fib: -Continued on Tikosyn was in sinus rhythm through most of his hospital stay this morning was in rate controlled atrial fibrillation, continued on Tikosyn and carvedilol -On anticoagulation with Eliquis  -Cardiology felt that the he was stable to discharge and they will follow-up in the office in a week and decide if he needs cardioversion, or change in antiarrhythmic therapy  Chronic renal disease stage III:  -Creatinine remains stable in the 1.5 range  Coronary artery disease: Diagnosed previously based on CT scan showing coronary calcification. Continue statin. No aspirin as patient is on Eliquis.  Chronic right great toe ulcer: Present on admission. Wound care consulted. They have recommended:  Cleanse right great toe with NS and pat dry. Apply Xeroform gauze to wound bed. Cover with dry gauze and tape. Change daily. -Follow-up with vascular, patient follows either at [facility] or [facility] for this  GERD: Increase PPI to BID for now  Obesity: Noted BMI 32.08. noted.   Discharge Exam: Vitals:   02/15/19 0928 02/15/19 1246  BP: 102/60 100/75  Pulse: 60 85  Resp:  18  Temp:  98.1 F (36.7 C)  SpO2:  95%    General:  AAOx3 Cardiovascular: S1S2/RRR Respiratory: CTAB  Discharge Instructions   Discharge Instructions    Diet - low sodium heart healthy   Complete by: As directed    Increase  activity slowly   Complete by: As directed      Allergies as of 02/15/2019      Reactions   Pradaxa [dabigatran Etexilate Mesylate] Nausea Only   Penicillins Rash   Did it involve swelling of the face/tongue/throat, SOB, or low BP? No Did it involve sudden or severe rash/hives, skin peeling, or any reaction on the inside of your mouth or nose? No Did you need to seek medical attention at a hospital or doctor's office? Unk When did it last happen? "it happened many years ago" If all above answers are "NO", may proceed with cephalosporin use.      Medication List    TAKE these medications   apixaban 5 MG Tabs tablet Commonly known as: ELIQUIS Take 1 tablet (5 mg total) by mouth 2 (two) times daily.   carvedilol 6.25 MG tablet Commonly known as: COREG Take 3.125 mg by mouth 2 (two) times daily with a meal.   dofetilide 125 MCG capsule Commonly known as: TIKOSYN Take 1 capsule (125 mcg total) by mouth 2 (two) times daily.   dorzolamide 2 % ophthalmic solution Commonly known as: TRUSOPT Place 1 drop into both eyes 2 (two) times daily.   doxycycline 100 MG tablet Commonly known as: VIBRA-TABS Take 1 tablet (100 mg total) by mouth every 12 (twelve) hours for 5 days.   latanoprost 0.005 % ophthalmic solution Commonly known as: XALATAN Place 1 drop into both eyes at bedtime.   LIQUID TEARS OP Place 1-2 drops into both eyes as needed (for dry eyes).   One-A-Day Mens 50+ Advantage Tabs Take 1 tablet by mouth daily with breakfast.   polyethylene glycol 17 g packet Commonly known as: MIRALAX / GLYCOLAX Take 17 g by mouth daily.   potassium chloride SA 20 MEQ tablet Commonly known as: K-DUR Take 2 tablets (40 mEq total) by mouth every morning AND 1 tablet (20 mEq total) every evening. What changed: See the new instructions.   rosuvastatin 20 MG tablet Commonly known as: CRESTOR Take 1 tablet (20 mg total) by mouth daily.   Tafamidis 61 MG Caps Take 61 mg by mouth daily  after breakfast.   torsemide 20 MG tablet Commonly known as: DEMADEX Take 3 tablets (60 mg total) by mouth every morning.      Allergies  Allergen Reactions  . Pradaxa [Dabigatran Etexilate Mesylate] Nausea Only  . Penicillins Rash    Did it involve swelling of the face/tongue/throat, SOB, or low BP? No Did it involve sudden or severe rash/hives, skin peeling, or any reaction on the inside of your mouth or nose? No Did you need to seek medical attention at a hospital or doctor's office? Unk When did it last happen? "it happened many years ago" If all above answers are "NO", may proceed with cephalosporin use.    Follow-up Information    [facility] Follow up on 02/22/2019.   Specialty: Cardiology Why: EKG 1000. Garage Code 9007 Contact information: [address] [id number] [place]       [name], MD.   Specialty: Internal Medicine Why: Please follow up in a week Contact information: [place] [address] [phone]            The  results of significant diagnostics from this hospitalization (including imaging, microbiology, ancillary and laboratory) are listed below for reference.    Significant Diagnostic Studies: Ct Tibia Fibula Left Wo Contrast  Result Date: 02/15/2019 CLINICAL DATA:  Left lower extremity pain and swelling EXAM: CT OF THE LOWER LEFT EXTREMITY WITHOUT CONTRAST TECHNIQUE: Multidetector CT imaging of the lower left extremity was performed according to the standard protocol. COMPARISON:  None. FINDINGS: Bones/Joint/Cartilage No acute fracture. No dislocation. Subchondral cystic changes of the patella and opposing trochlea favored degenerative. Partially visualized small left knee joint effusion. No cortical destruction or periostitis. No bone lesion. Ligaments Suboptimally assessed by CT. Muscles and Tendons Preserved muscle bulk  without significant atrophy or fatty infiltration. No intramuscular fluid collections. Extensive mineralization within the Achilles tendon which is moderately thickened. Tendinous structures appear intact. Soft tissues Circumferential edema within the superficial soft tissues. Fluid tracks along the superficial fascia overlying the lower leg musculature, greatest anteriorly at the level of the proximal tibia and fibula. No well-defined or drainable fluid collections. No deep fascial fluid. No soft tissue gas. IMPRESSION: 1. Nonspecific circumferential edema and fluid within the superficial soft tissues of the left lower extremity. Findings can be seen in fluid overload states as well as cellulitis. No well-defined or drainable fluid collections. No soft tissue gas. 2. There is a small left knee joint effusion and cystic changes of the patella and trochlear articular surface, which are favored degenerative. Consider arthrocentesis if clinical concern for septic joint. 3. Otherwise, no suspicious osseous findings including cortical erosion or periostitis to suggest acute osteomyelitis. Electronically Signed   By: Davina Poke M.D.   On: 02/15/2019 08:13   Dg Chest Port 1 View  Result Date: 02/08/2019 CLINICAL DATA:  Chronic dyspnea EXAM: PORTABLE CHEST 1 VIEW COMPARISON:  10/19/2018 FINDINGS: Small right pleural effusion, decreased. Small left pleural effusion, increased. No frank interstitial edema. No pneumothorax. Cardiomegaly.  Thoracic aortic atherosclerosis. IMPRESSION: Small bilateral pleural effusions, waxing/waning. No frank interstitial edema. Electronically Signed   By: Julian Hy M.D.   On: 02/08/2019 18:57   Vas [name] Lower Extremity Venous (dvt)  Result Date: 02/11/2019  Lower Venous Study Indications: Pain, Swelling, Erythema, and cellulitis.  Risk Factors: CHF. Comparison Study: No prior study on file Performing Technologist: Sharion Dove RVS  Examination Guidelines: A complete  evaluation includes B-mode imaging, spectral Doppler, color Doppler, and power Doppler as needed of all accessible portions of each vessel. Bilateral testing is considered an integral part of a complete examination. Limited examinations for reoccurring indications may be performed as noted.  +-----+---------------+---------+-----------+----------+--------------+ RIGHTCompressibilityPhasicitySpontaneityPropertiesThrombus Aging +-----+---------------+---------+-----------+----------+--------------+ CFV  Full                                         Pulsatile      +-----+---------------+---------+-----------+----------+--------------+   +---------+---------------+---------+-----------+----------+--------------+ LEFT     CompressibilityPhasicitySpontaneityPropertiesThrombus Aging +---------+---------------+---------+-----------+----------+--------------+ CFV      Full                                         Pulsatile      +---------+---------------+---------+-----------+----------+--------------+ SFJ      Full                                                        +---------+---------------+---------+-----------+----------+--------------+  FV Prox  Full                                                        +---------+---------------+---------+-----------+----------+--------------+ FV Mid   Full                                                        +---------+---------------+---------+-----------+----------+--------------+ FV DistalFull                                                        +---------+---------------+---------+-----------+----------+--------------+ PFV      Full                                                        +---------+---------------+---------+-----------+----------+--------------+ POP      Full                                         Pulsatile      +---------+---------------+---------+-----------+----------+--------------+  PTV      Full                                                        +---------+---------------+---------+-----------+----------+--------------+ PERO     Full                                                        +---------+---------------+---------+-----------+----------+--------------+     Summary: Right: No evidence of common femoral vein obstruction. Left: There is no evidence of deep vein thrombosis in the lower extremity.  *See table(s) above for measurements and observations. Electronically signed by Servando Snare MD on 02/11/2019 at 12:59:19 PM.    Final     Microbiology: Recent Results (from the past 240 hour(s))  SARS CORONAVIRUS 2 (TAT 6-24 HRS) Nasopharyngeal Nasopharyngeal Swab     Status: None   Collection Time: 02/08/19 12:02 AM   Specimen: Nasopharyngeal Swab  Result Value Ref Range Status   SARS Coronavirus 2 NEGATIVE NEGATIVE Final    Comment: (NOTE) SARS-CoV-2 target nucleic acids are NOT DETECTED. The SARS-CoV-2 RNA is generally detectable in upper and lower respiratory specimens during the acute phase of infection. Negative results do not preclude SARS-CoV-2 infection, do not rule out co-infections with other pathogens, and should not be used as the sole basis for treatment or other patient management decisions. Negative results must be combined with clinical observations, patient history, and epidemiological information. The  expected result is Negative. Fact Sheet for Patients: SugarRoll.be Fact Sheet for Healthcare Providers: https://www.woods-mathews.com/ This test is not yet approved or cleared by the [place] FDA and  has been authorized for detection and/or diagnosis of SARS-CoV-2 by FDA under an Emergency Use Authorization (EUA). This EUA will remain  in effect (meaning this test can be used) for the duration of the COVID-19 declaration under Section 56 4(b)(1) of the Act, 21 U.S.C. section  360bbb-3(b)(1), unless the authorization is terminated or revoked sooner. Performed at [facility], [place] [address] [place], [place] [address]   Culture, blood (routine x 2)     Status: None   Collection Time: 02/08/19  6:37 PM   Specimen: BLOOD  Result Value Ref Range Status   Specimen Description BLOOD LEFT ANTECUBITAL  Final   Special Requests   Final    BOTTLES DRAWN AEROBIC ONLY Blood Culture adequate volume   Culture   Final    NO GROWTH 5 DAYS Performed at [facility], [place] [address], [place], [place] [address]    Report Status 02/13/2019 FINAL  Final  Culture, blood (routine x 2)     Status: None   Collection Time: 02/08/19  6:43 PM   Specimen: BLOOD LEFT HAND  Result Value Ref Range Status   Specimen Description BLOOD LEFT HAND  Final   Special Requests   Final    BOTTLES DRAWN AEROBIC ONLY Blood Culture adequate volume   Culture   Final    NO GROWTH 5 DAYS Performed at [facility], [place] [address] [place], [place] [address]    Report Status 02/13/2019 FINAL  Final     Labs: Basic Metabolic Panel: Recent Labs  Lab 02/12/19 0020 02/13/19 0631 02/13/19 1510 02/14/19 0719 02/15/19 0450  NA 133* 133* 133* 136 135  K 3.8 3.1* 4.5 3.9 3.4*  CL 97* 93* 92* 94* 96*  CO2 24 29 30 29 28   GLUCOSE 98 91 131* 100* 92  BUN 19 14 14 14 16   CREATININE 1.44* 1.24 1.49* 1.48* 1.32*  CALCIUM 8.9 8.7* 9.4 9.2 8.9  MG 2.0 1.8  --  2.3 1.9   Liver Function Tests: No results for input(s): AST, ALT, ALKPHOS, BILITOT, PROT, ALBUMIN in the last 168 hours. No results for input(s): LIPASE, AMYLASE in the last 168 hours. No results for input(s): AMMONIA in the last 168 hours. CBC: Recent Labs  Lab 02/12/19 0020 02/13/19 0631 02/14/19 0719  WBC 12.1* 11.6* 10.5  NEUTROABS 8.4* 8.5*  --   HGB 15.3 15.2 15.4  HCT 47.7 47.5 46.5  MCV 86.7 86.4 85.5  PLT 187 271 270   Cardiac Enzymes: No results for input(s): CKTOTAL, CKMB, CKMBINDEX, TROPONINI in the  last 168 hours. BNP: BNP (last 3 results) Recent Labs    12/08/18 1042 12/19/18 1526 01/30/19 1257  BNP 720.6* 811.3* 706.0*    ProBNP (last 3 results) No results for input(s): PROBNP in the last 8760 hours.  CBG: No results for input(s): GLUCAP in the last 168 hours.     Signed:  Domenic Polite MD.  [facility] 02/16/2019, 2:45 PM

## 2019-02-22 ENCOUNTER — Ambulatory Visit (HOSPITAL_COMMUNITY)
Admit: 2019-02-22 | Discharge: 2019-02-22 | Disposition: A | Discharge status: Home / Self Care | Payer: Medicare Other | Source: Ambulatory Visit | Attending: Internal Medicine | Admitting: Internal Medicine

## 2019-02-22 ENCOUNTER — Other Ambulatory Visit: Payer: Self-pay

## 2019-02-22 VITALS — HR 78 | Wt 214.2 lb

## 2019-02-22 DIAGNOSIS — I429 Cardiomyopathy, unspecified: Secondary | ICD-10-CM

## 2019-02-22 LAB — BASIC METABOLIC PANEL WITH GFR
Anion gap: 10 (ref 5–15)
BUN: 30 mg/dL — ABNORMAL HIGH (ref 8–23)
CO2: 27 mmol/L (ref 22–32)
Calcium: 9.5 mg/dL (ref 8.9–10.3)
Chloride: 96 mmol/L — ABNORMAL LOW (ref 98–111)
Creatinine, Ser: 1.81 mg/dL — ABNORMAL HIGH (ref 0.61–1.24)
GFR calc Af Amer: 41 mL/min — ABNORMAL LOW
GFR calc non Af Amer: 35 mL/min — ABNORMAL LOW
Glucose, Bld: 166 mg/dL — ABNORMAL HIGH (ref 70–99)
Potassium: 3.6 mmol/L (ref 3.5–5.1)
Sodium: 133 mmol/L — ABNORMAL LOW (ref 135–145)

## 2019-02-22 LAB — MAGNESIUM: Magnesium: 2.2 mg/dL (ref 1.7–2.4)

## 2019-02-22 NOTE — Progress Notes (Signed)
Pt vitals stable on exam. ekg shows sinus with 1st degree hb. qtc 413ms. Lab work obtained. Pt and wife questioned about over the counter meds for GERD. Provided safe OTC med list. Pt concerned with cellulitus of leg follow up. Advised to follow up with PCP. No further questions at this time.

## 2019-03-02 ENCOUNTER — Encounter (HOSPITAL_COMMUNITY): Payer: No Typology Code available for payment source | Admitting: Internal Medicine

## 2019-03-14 ENCOUNTER — Emergency Department (HOSPITAL_BASED_OUTPATIENT_CLINIC_OR_DEPARTMENT_OTHER): Payer: No Typology Code available for payment source

## 2019-03-14 ENCOUNTER — Emergency Department (HOSPITAL_BASED_OUTPATIENT_CLINIC_OR_DEPARTMENT_OTHER)
Admission: EM | Admit: 2019-03-14 | Discharge: 2019-03-14 | Disposition: A | Discharge status: Home / Self Care | Payer: No Typology Code available for payment source | Attending: Emergency Medicine | Admitting: Emergency Medicine

## 2019-03-14 ENCOUNTER — Other Ambulatory Visit: Payer: Self-pay

## 2019-03-14 ENCOUNTER — Encounter: Payer: Self-pay | Admitting: Cardiology

## 2019-03-14 ENCOUNTER — Encounter (HOSPITAL_BASED_OUTPATIENT_CLINIC_OR_DEPARTMENT_OTHER): Payer: Self-pay

## 2019-03-14 DIAGNOSIS — I5043 Acute on chronic combined systolic (congestive) and diastolic (congestive) heart failure: Secondary | ICD-10-CM | POA: Diagnosis not present

## 2019-03-14 DIAGNOSIS — Z7901 Long term (current) use of anticoagulants: Secondary | ICD-10-CM | POA: Insufficient documentation

## 2019-03-14 DIAGNOSIS — I4891 Unspecified atrial fibrillation: Secondary | ICD-10-CM | POA: Insufficient documentation

## 2019-03-14 DIAGNOSIS — I130 Hypertensive heart and chronic kidney disease with heart failure and stage 1 through stage 4 chronic kidney disease, or unspecified chronic kidney disease: Secondary | ICD-10-CM | POA: Diagnosis not present

## 2019-03-14 DIAGNOSIS — Z87891 Personal history of nicotine dependence: Secondary | ICD-10-CM | POA: Insufficient documentation

## 2019-03-14 DIAGNOSIS — R0602 Shortness of breath: Secondary | ICD-10-CM | POA: Diagnosis present

## 2019-03-14 DIAGNOSIS — I259 Chronic ischemic heart disease, unspecified: Secondary | ICD-10-CM | POA: Insufficient documentation

## 2019-03-14 DIAGNOSIS — Z79899 Other long term (current) drug therapy: Secondary | ICD-10-CM | POA: Diagnosis not present

## 2019-03-14 DIAGNOSIS — N183 Chronic kidney disease, stage 3 unspecified: Secondary | ICD-10-CM | POA: Diagnosis not present

## 2019-03-14 LAB — CBC WITH DIFFERENTIAL/PLATELET
Abs Immature Granulocytes: 0.03 10*3/uL (ref 0.00–0.07)
Basophils Absolute: 0.1 10*3/uL (ref 0.0–0.1)
Basophils Relative: 1 %
Eosinophils Absolute: 0.2 10*3/uL (ref 0.0–0.5)
Eosinophils Relative: 2 %
HCT: 49.3 % (ref 39.0–52.0)
Hemoglobin: 15.6 g/dL (ref 13.0–17.0)
Immature Granulocytes: 0 %
Lymphocytes Relative: 19 %
Lymphs Abs: 2 10*3/uL (ref 0.7–4.0)
MCH: 28 pg (ref 26.0–34.0)
MCHC: 31.6 g/dL (ref 30.0–36.0)
MCV: 88.4 fL (ref 80.0–100.0)
Monocytes Absolute: 1 10*3/uL (ref 0.1–1.0)
Monocytes Relative: 10 %
Neutro Abs: 6.9 10*3/uL (ref 1.7–7.7)
Neutrophils Relative %: 68 %
Platelets: 219 10*3/uL (ref 150–400)
RBC: 5.58 MIL/uL (ref 4.22–5.81)
RDW: 15.2 % (ref 11.5–15.5)
Smear Review: NORMAL
WBC: 10.1 10*3/uL (ref 4.0–10.5)
nRBC: 0 % (ref 0.0–0.2)

## 2019-03-14 LAB — COMPREHENSIVE METABOLIC PANEL
ALT: 15 U/L (ref 0–44)
AST: 23 U/L (ref 15–41)
Albumin: 4.1 g/dL (ref 3.5–5.0)
Alkaline Phosphatase: 93 U/L (ref 38–126)
Anion gap: 13 (ref 5–15)
BUN: 26 mg/dL — ABNORMAL HIGH (ref 8–23)
CO2: 24 mmol/L (ref 22–32)
Calcium: 9.3 mg/dL (ref 8.9–10.3)
Chloride: 98 mmol/L (ref 98–111)
Creatinine, Ser: 1.34 mg/dL — ABNORMAL HIGH (ref 0.61–1.24)
GFR calc Af Amer: 58 mL/min — ABNORMAL LOW (ref >60)
GFR calc non Af Amer: 50 mL/min — ABNORMAL LOW (ref >60)
Glucose, Bld: 122 mg/dL — ABNORMAL HIGH (ref 70–99)
Potassium: 3.9 mmol/L (ref 3.5–5.1)
Sodium: 135 mmol/L (ref 135–145)
Total Bilirubin: 2 mg/dL — ABNORMAL HIGH (ref 0.3–1.2)
Total Protein: 7.8 g/dL (ref 6.5–8.1)

## 2019-03-14 LAB — TROPONIN I (HIGH SENSITIVITY)
Troponin I (High Sensitivity): 56 ng/L — ABNORMAL HIGH (ref <18)
Troponin I (High Sensitivity): 53 ng/L — ABNORMAL HIGH (ref <18)

## 2019-03-14 LAB — BRAIN NATRIURETIC PEPTIDE: B Natriuretic Peptide: 752.5 pg/mL — ABNORMAL HIGH (ref 0.0–100.0)

## 2019-03-14 MED ORDER — FUROSEMIDE 10 MG/ML IJ SOLN
60.0000 mg | Freq: Once | INTRAMUSCULAR | Status: AC
  Administered 2019-03-14: 13:00:00 60 mg via INTRAVENOUS
  Filled 2019-03-14: qty 6

## 2019-03-14 NOTE — ED Notes (Signed)
ED Provider at bedside. 

## 2019-03-14 NOTE — Telephone Encounter (Signed)
Patient's wife is calling stating patient is very SOB.  He went to PCP and was advised to get in contact with [facility] as soon as possible. Wife stated since she is unable to reach someone right away, she is going to take him to the [facility] ED but would still lile a call back  Please call

## 2019-03-14 NOTE — ED Notes (Signed)
Pt ambulated around department- SpO2 96% and HR 78 bpm

## 2019-03-14 NOTE — Discharge Instructions (Signed)
Take 2 of the torsemide tonight and then continue 2 in the morning and 2 in the night until cardiology tells you to go back to your regular dose.  If the swelling and shortness of breath becomes worse please return to the emergency room.

## 2019-03-14 NOTE — ED Triage Notes (Signed)
Per pt and wife pt with SOB since hosp d/c 9/16-worse x 4 days-pt brought to tx room via w/c by RT-assisted to stretcher and with undress-pt denies pain

## 2019-03-14 NOTE — ED Notes (Signed)
Family member given drink and crackers. Pt resting

## 2019-03-14 NOTE — ED Provider Notes (Signed)
[facility] EMERGENCY DEPARTMENT Provider Note   CSN: [id number] Arrival date & time: 03/14/19  1108     History   Chief Complaint Chief Complaint  Patient presents with  . Shortness of Breath    HPI RASHI GIULIANI is a 78 y.o. male.     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  Patient was hospitalized in the middle of September for CHF exacerbation and in addition to left lower extremity cellulitis.  That did improve after IV antibiotics and Lasix.  Patient's wife states that since he has been home he has had good days and bad days but over the last week he has had worsening shortness of breath, orthopnea, exertional dyspnea and a more wet sounding cough.  They followed up at the [facility] [place] today and she was going to adjust his torsemide to 40 mg in the morning and 40 at night but he has been taking 60 mg daily since leaving the hospital.  Patient's wife states that he is up 10 to 15 pounds.  No fever, chest pain, abdominal pain or vomiting.  No further redness of the leg.  The history is provided by the patient and the spouse.  Shortness of Breath Severity:  Moderate Onset quality:  Gradual Duration:  1 week Timing:  Constant Progression:  Worsening Chronicity:  Recurrent Context: activity   Relieved by:  Sitting up and rest Worsened by:  Exertion (lying down) Ineffective treatments:  Sitting up and rest Associated symptoms: cough   Associated symptoms: no abdominal pain, no chest pain, no fever, no sputum production, no vomiting and no wheezing   Associated symptoms comment:  Worsening lower extremity swelling   Past Medical History:  Diagnosis Date  . Arthritis    "left foot" (06/01/2018)  . Atrial  fibrillation ([place])   . CAD (coronary artery disease)   . Cardiomyopathy ([place])   . Cellulitis and abscess of left leg 02/08/2019  . Chronic systolic CHF (congestive heart failure) ([place]) 04/12/2016  . Dyspnea   . Dysrhythmia Atrial flutter  . 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 Hypertension     Patient Active Problem List   Diagnosis Date Noted  . Cellulitis 02/08/2019  . Acute on chronic combined systolic and diastolic CHF (congestive heart failure) ([place]) 02/08/2019  . CKD (chronic kidney disease), stage III 02/08/2019  . 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 Pulmonary edema 05/31/2018  . Acute on chronic diastolic CHF (congestive heart failure) ([place]) 05/31/2018  . Acute on chronic systolic CHF (congestive heart failure) ([place])   . Shortness of breath 03/08/2018  . AKI (acute kidney injury) ([place])   . Elevated troponin   . Pleural effusion on right 04/12/2016  . Acute on chronic systolic congestive heart failure ([place]) 04/12/2016  . CAD (coronary artery disease) 11/27/2015  . PAF (paroxysmal atrial fibrillation) ([place]) 09/25/2015  . Cardiomyopathy ([place]) 09/25/2015  . Typical atrial flutter ([place])   . Atypical atrial flutter ([place]) 08/28/2015  . Essential hypertension 08/28/2015    Past Surgical History:  Procedure Laterality Date  . CARDIOVERSION N/A 09/04/2015   Procedure: CARDIOVERSION;  Surgeon: Skeet Latch, MD;  Location: [facility];  Service: Cardiovascular;  Laterality: N/A;  . IR THORACENTESIS ASP PLEURAL SPACE W/IMG GUIDE  06/02/2018  . TEE WITHOUT CARDIOVERSION N/A 09/04/2015   Procedure: TRANSESOPHAGEAL ECHOCARDIOGRAM (TEE);  Surgeon: Skeet Latch, MD;  Location: [facility];  Service: Cardiovascular;  Laterality: N/A;  . TEE WITHOUT CARDIOVERSION N/A 04/15/2016   Procedure: TRANSESOPHAGEAL ECHOCARDIOGRAM (TEE);  Surgeon: Jerline Pain, MD;  Location: [facility] ENDOSCOPY;  Service: Cardiovascular;  Laterality: N/A;  . TOE AMPUTATION  Right    4th digit        Home Medications    Prior to Admission medications   Medication Sig Start Date End Date Taking? Authorizing Provider  apixaban (ELIQUIS) 5 MG TABS tablet Take 1 tablet (5 mg total) by mouth 2 (two) times daily. 08/28/15   Lelon Perla, MD  carvedilol (COREG) 6.25 MG tablet Take 3.125 mg by mouth 2 (two) times daily with a meal.    [provider]  dofetilide (TIKOSYN) 125 MCG capsule Take 1 capsule (125 mcg total) by mouth 2 (two) times daily. 06/07/18   Shirley Friar, PA-C  dorzolamide (TRUSOPT) 2 % ophthalmic solution Place 1 drop into both eyes 2 (two) times daily.    [provider]  latanoprost (XALATAN) 0.005 % ophthalmic solution Place 1 drop into both eyes at bedtime.    [provider]  Multiple Vitamins-Minerals (ONE-A-DAY MENS 50+ ADVANTAGE) TABS Take 1 tablet by mouth daily with breakfast.    [provider]  polyethylene glycol (MIRALAX / GLYCOLAX) 17 g packet Take 17 g by mouth daily. 02/15/19   Domenic Polite, MD  Polyvinyl Alcohol (LIQUID TEARS OP) Place 1-2 drops into both eyes as needed (for dry eyes).    [provider]  potassium chloride SA (K-DUR) 20 MEQ tablet Take 2 tablets (40 mEq total) by mouth every morning AND 1 tablet (20 mEq total) every evening. Patient taking differently: Take 20 mEq by mouth in the morning and 20 mEq at bedtime 01/30/19   Bensimhon, Shaune Pascal, MD  rosuvastatin (CRESTOR) 20 MG tablet Take 1 tablet (20 mg total) by mouth daily. 09/14/18 02/22/19  Skeet Latch, MD  Tafamidis 61 MG CAPS Take 61 mg by mouth daily after breakfast.     [provider]  torsemide (DEMADEX) 20 MG tablet Take 3 tablets (60 mg total) by mouth every morning. 01/30/19   Bensimhon, Shaune Pascal, MD    Family History Family History  Problem Relation Age of Onset  . Heart disease Other        No family history    Social History Social History   Tobacco Use  . Smoking status:  Former Smoker    Packs/day: 0.12    Types: Cigarettes    Quit date: 1990    Years since quitting: 30.8  . Smokeless tobacco: Never Used  . Tobacco comment: 06/01/2018 "smoked off and on for 30 years; don't know how many years I actually smoked"  Substance Use Topics  . Alcohol use: Not Currently    Comment: \  . Drug use: Never     Allergies   Pradaxa [dabigatran etexilate mesylate] and Penicillins   Review of Systems Review of Systems  Constitutional: Negative for fever.  Respiratory: Positive for cough and shortness of breath. Negative for sputum production and wheezing.   Cardiovascular: Negative for chest pain.  Gastrointestinal: Negative for abdominal pain and vomiting.  All other systems reviewed and are negative.    Physical Exam Updated Vital Signs BP 115/71 (BP Location: Right Arm)  Pulse 68   Temp 98 F (36.7 C) (Oral)   Resp (!) 22   Ht 5\' 11"  (1.803 m)   Wt 107 kg   SpO2 92%   BMI 32.92 kg/m   Physical Exam Vitals signs and nursing note reviewed.  Constitutional:      General: He is not in acute distress.    Appearance: He is well-developed.  HENT:     Head: Normocephalic and atraumatic.  Eyes:     Conjunctiva/sclera: Conjunctivae normal.     Pupils: Pupils are equal, round, and reactive to light.  Neck:     Musculoskeletal: Normal range of motion and neck supple.  Cardiovascular:     Rate and Rhythm: Normal rate. Rhythm irregularly irregular.     Heart sounds: No murmur.  Pulmonary:     Effort: Pulmonary effort is normal. No respiratory distress.     Breath sounds: Examination of the right-lower field reveals decreased breath sounds. Examination of the left-lower field reveals decreased breath sounds. Decreased breath sounds and rales present. No wheezing.  Abdominal:     General: There is distension.     Palpations: Abdomen is soft.     Tenderness: There is no abdominal tenderness. There is no guarding or rebound.  Musculoskeletal: Normal  range of motion.        General: No tenderness.     Right lower leg: Edema present.     Left lower leg: Edema present.     Comments: 2-3+ edema in bilateral lower extremities up to the knee  Skin:    General: Skin is warm and dry.     Findings: No erythema or rash.  Neurological:     General: No focal deficit present.     Mental Status: He is alert and oriented to person, place, and time. Mental status is at baseline.  Psychiatric:        Mood and Affect: Mood normal.        Behavior: Behavior normal.        Thought Content: Thought content normal.      ED Treatments / Results  Labs (all labs ordered are listed, but only abnormal results are displayed) Labs Reviewed  COMPREHENSIVE METABOLIC PANEL - Abnormal; Notable for the following components:      Result Value   Glucose, Bld 122 (*)    BUN 26 (*)    Creatinine, Ser 1.34 (*)    Total Bilirubin 2.0 (*)    GFR calc non Af Amer 50 (*)    GFR calc Af Amer 58 (*)    All other components within normal limits  BRAIN NATRIURETIC PEPTIDE - Abnormal; Notable for the following components:   B Natriuretic Peptide 752.5 (*)    All other components within normal limits  TROPONIN I (HIGH SENSITIVITY) - Abnormal; Notable for the following components:   Troponin I (High Sensitivity) 56 (*)    All other components within normal limits  TROPONIN I (HIGH SENSITIVITY) - Abnormal; Notable for the following components:   Troponin I (High Sensitivity) 53 (*)    All other components within normal limits  CBC WITH DIFFERENTIAL/PLATELET    EKG EKG Interpretation  Date/Time:  Tuesday March 14 2019 11:22:49 EDT Ventricular Rate:  86 PR Interval:    QRS Duration: 100 QT Interval:  413 QTC Calculation: 494 R Axis:   -163 Text Interpretation:  Sinus or ectopic atrial rhythm Sinus pause Abnormal lateral Q waves Anteroseptal infarct, old Baseline wander Confirmed by Blanchie Dessert (  [id number]) on 03/14/2019 11:30:24 AM   Radiology Dg Chest  Port 1 View  Result Date: 03/14/2019 CLINICAL DATA:  Shortness of breath. EXAM: PORTABLE CHEST 1 VIEW COMPARISON:  February 08, 2019. FINDINGS: Stable cardiomegaly. No pneumothorax is noted. Stable bilateral pleural effusions are noted with associated bibasilar atelectasis. Bony thorax is unremarkable. IMPRESSION: Stable bibasilar atelectasis and associated pleural effusions. Aortic Atherosclerosis (ICD10-I70.0). Electronically Signed   By: Marijo Conception M.D.   On: 03/14/2019 12:18    Procedures Procedures (including critical care time)  Medications Ordered in ED Medications - No data to display   Initial Impression / Assessment and Plan / ED Course  I have reviewed the triage vital signs and the nursing notes.  Pertinent labs & imaging results that were available during my care of the patient were reviewed by me and considered in my medical decision making (see chart for details).       Elderly male with multiple medical problems presenting today with symptoms concerning for CHF exacerbation.  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  He takes 60 mg of torsemide daily and over the last week symptoms have worsened.  He has no symptoms concerning for pneumonia.  Low suspicion for ACS or PE or dissection at this time.  Patient is fluid overloaded on exam and was hypoxic upon arrival here with oxygen saturation of 88% on room air.  Patient does not use oxygen at home.  X-ray today shows bilateral pleural effusions which are not significantly changed.  Renal function remains stable and CBC with normal hemoglobin.  BNP and troponin are pending.  Will give patient an IV dose of Lasix and reevaluate.  He and his wife both wish that he would be able to go home and want to try to see how he responds to the IV medication. BNP is elevated at 750 which is slightly higher than his baseline.  Troponin is 56 and delta troponin was 53.   Based on prior non-high-sensitivity troponins patient always had a leak.  Suspect that this is most likely his baseline.  After Lasix patient has had a 1 L output.  Off of oxygen he is satting between 93 and 95%.  Will ambulate patient as he is eager to go home.  However he becomes hypoxic or has significant dyspnea he will most likely require admission.  3:41 PM Patient ambulated without difficulty and maintained oxygen saturation of 96%.  Patient would like to go home.  He will continue oral torsemide and close follow-up with cardiology.  Final Clinical Impressions(s) / ED Diagnoses   Final diagnoses:  Acute on chronic combined systolic and diastolic congestive heart failure [facility])    ED Discharge Orders    None       Blanchie Dessert, MD 03/14/19 1542

## 2019-03-15 ENCOUNTER — Telehealth: Payer: Self-pay | Admitting: Cardiology

## 2019-03-15 ENCOUNTER — Telehealth (HOSPITAL_COMMUNITY): Payer: Self-pay

## 2019-03-15 ENCOUNTER — Encounter (HOSPITAL_COMMUNITY): Payer: No Typology Code available for payment source | Admitting: Internal Medicine

## 2019-03-15 NOTE — Telephone Encounter (Signed)
This encounter was created in error - please disregard.

## 2019-03-15 NOTE — Telephone Encounter (Signed)
Received call from patients wife inquiring about referral to [facility].  Advised I will forward message to Dr Gillermina Hu nurse to inquire.  She also notes that patient was seen in ED for SOB yesterday.  She is requesting an appt.  Advised office will contact her to schedule this appointment.

## 2019-03-15 NOTE — Telephone Encounter (Signed)
Spoke with pt and wife, the patient went to the ER yesterday for lasix. This morning his weight is down 3 lbs on their scales. His torsemide was changed yesterday to 40 mg twice daily. He reports not being able to lie down to sleep due to waking up SOB, he used 3 pillows last night. This morning his right leg is down and the left leg is swollen as it has been since the cellulitis. Patient was told to follow up closely with cardiology. Discussed with dr Stanford Breed, patient will see the advanced heart failure clinic today at 12:40 pm.

## 2019-03-15 NOTE — Telephone Encounter (Signed)
Left message for pt to call.

## 2019-03-15 NOTE — Telephone Encounter (Signed)
Please call patient regarding shortness of breath, went to [facility] for lasix.

## 2019-03-15 NOTE — Telephone Encounter (Signed)
appt sch to see Dr Haroldine Laws today

## 2019-03-16 NOTE — Telephone Encounter (Signed)
Please let them know that I referred them to Dr. Delora Fuel at [facility] and his office will be contacting them soon. Let me know if the don;t hear anything within 2 weeks.

## 2019-03-17 NOTE — Telephone Encounter (Signed)
Pt was not seen on 10/14, have tried to call pt to f/u on how he is feeling and resch appt as needed and let him know about ref to [facility].  Left message to call back

## 2019-03-27 NOTE — Progress Notes (Signed)
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 Had stress test 8/17 at [facility] that showed EF 41 and no ischemia. Pt admitted 11/17 with dyspnea and right pleural effusion. Had thoracentesis and cytology negative; diuresed. TEE 11/17 showed EF 35-40, biatrial enlargement and moderate TR. Echocardiogram October 2019 showed ejection fraction 45 to 50%; severe left ventricular hypertrophy and amyloid should be considered; moderate diastolic dysfunction; mildly dilated ascending aorta, biatrial enlargement and mild tricuspid regurgitation. CTA October 2019 showed large right pleural effusion. 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 Patient admitted September 2020 with lower extremity cellulitis and volume excess.  Patient was treated with antibiotics and diuretics.  Since last seen, he has increased dyspnea on exertion.  There is orthopnea and bilateral lower extremity edema.  No exertional chest pain or syncope.  Current Outpatient Medications  Medication Sig Dispense Refill  . apixaban (ELIQUIS) 5 MG TABS tablet Take 1 tablet (5 mg total) by mouth 2 (two) times daily. 60 tablet 6  . carvedilol (COREG) 6.25 MG tablet Take 3.125 mg by mouth 2 (two) times daily with a meal.    . dofetilide (TIKOSYN) 125 MCG capsule Take 1 capsule (125 mcg total) by mouth 2 (two) times daily. 60 capsule 6  . dorzolamide (TRUSOPT) 2 % ophthalmic solution Place 1 drop into both eyes 2 (two) times daily.    Marland Kitchen latanoprost (XALATAN) 0.005 % ophthalmic solution Place 1 drop into both eyes at bedtime.    . Multiple Vitamins-Minerals (ONE-A-DAY MENS 50+ ADVANTAGE) TABS Take 1 tablet by mouth daily with breakfast.    . Polyvinyl Alcohol (LIQUID TEARS OP) Place 1-2 drops into both eyes as  needed (for dry eyes).    . potassium chloride SA (K-DUR) 20 MEQ tablet Take 2 tablets (40 mEq total) by mouth every morning AND 1 tablet (20 mEq total) every evening. (Patient taking differently: Take 20 mEq by mouth in the morning and 20 mEq at bedtime) 270 tablet 3  . rosuvastatin (CRESTOR) 20 MG tablet Take 1 tablet (20 mg total) by mouth daily. 90 tablet 3  . Tafamidis 61 MG CAPS Take 61 mg by mouth daily after breakfast.     . torsemide (DEMADEX) 20 MG tablet Take 3 tablets (60 mg total) by mouth every morning. 150 tablet 3   No current facility-administered medications for this visit.      Past Medical History:  Diagnosis Date  . Arthritis    "left foot" (06/01/2018)  . Atrial fibrillation ([place])   . CAD (coronary artery disease)   . Cardiomyopathy ([place])   . Cellulitis and abscess of left leg 02/08/2019  . Chronic systolic CHF (congestive heart failure) ([place]) 04/12/2016  . Dyspnea   . Dysrhythmia Atrial flutter  . 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 Hypertension     Past Surgical History:  Procedure Laterality Date  . CARDIOVERSION N/A 09/04/2015   Procedure: CARDIOVERSION;  Surgeon: Skeet Latch, MD;  Location: [facility];  Service: Cardiovascular;  Laterality: N/A;  . IR THORACENTESIS ASP PLEURAL SPACE W/IMG GUIDE  06/02/2018  . TEE WITHOUT CARDIOVERSION N/A 09/04/2015   Procedure: TRANSESOPHAGEAL ECHOCARDIOGRAM (TEE);  Surgeon: Skeet Latch, MD;  Location: [facility];  Service: Cardiovascular;  Laterality: N/A;  . TEE WITHOUT CARDIOVERSION N/A 04/15/2016   Procedure: TRANSESOPHAGEAL ECHOCARDIOGRAM (  TEE);  Surgeon: Jerline Pain, MD;  Location: [facility] ENDOSCOPY;  Service: Cardiovascular;  Laterality: N/A;  . TOE AMPUTATION Right    4th digit    Social History   Socioeconomic History  . Marital status: Married    Spouse name: Not on file  . Number of children: 2  . Years of education: Not on file  . Highest education level:  Not on file  Occupational History  . Not on file  Social Needs  . Financial resource strain: Not on file  . Food insecurity    Worry: Not on file    Inability: Not on file  . Transportation needs    Medical: Not on file    Non-medical: Not on file  Tobacco Use  . Smoking status: Former Smoker    Packs/day: 0.12    Types: Cigarettes    Quit date: 1990    Years since quitting: 30.8  . Smokeless tobacco: Never Used  . Tobacco comment: 06/01/2018 "smoked off and on for 30 years; don't know how many years I actually smoked"  Substance and Sexual Activity  . Alcohol use: Not Currently    Comment: \  . Drug use: Never  . Sexual activity: Not on file  Lifestyle  . Physical activity    Days per week: Not on file    Minutes per session: Not on file  . Stress: Not on file  Relationships  . Social Herbalist on phone: Not on file    Gets together: Not on file    Attends religious service: Not on file    Active member of club or organization: Not on file    Attends meetings of clubs or organizations: Not on file    Relationship status: Not on file  . Intimate partner violence    Fear of current or ex partner: Not on file    Emotionally abused: Not on file    Physically abused: Not on file    Forced sexual activity: Not on file  Other Topics Concern  . Not on file  Social History Narrative  . Not on file    Family History  Problem Relation Age of Onset  . Heart disease Other        No family history    ROS: General fatigue but no fevers or chills, productive cough, hemoptysis, dysphasia, odynophagia, melena, hematochezia, dysuria, hematuria, rash, seizure activity, claudication. Remaining systems are negative.  Physical Exam: Well-developed well-nourished in no acute distress.  Skin is warm and dry.  HEENT is normal.  Neck is supple.  Chest is clear to auscultation with normal expansion.  Cardiovascular exam is regular rate and rhythm.  Abdominal exam  nontender or distended. No masses palpated. Extremities show 2+ edema. neuro grossly intact  A/P  1 chronic combined systolic/diastolic congestive heart failure-patient is volume overloaded today.  Increase Demadex to 60 mg twice daily for 3 days then 40 mg twice daily thereafter.  Continue fluid restriction and low-sodium diet.  Check potassium and renal function today and again in 1 week.  2 amyloidosis-continue tafamidis.  I have contacted Dr. Haroldine Laws to see if we can arrange patient to be seen at [facility] for amyloid study.  3 paroxysmal atrial fibrillation-patient remains in sinus rhythm today.  We will continue Tikosyn for now but if renal function worsens we will likely need to discontinue and treat with amiodarone long-term.  Continue apixaban.  4 cardiomyopathy-continue beta-blocker.  5 hypertension-blood pressure is controlled.  6 coronary artery disease-this diagnosis is based on previous CT scan showing coronary calcification.  Continue statin.  He is not on aspirin given need for anticoagulation.  7 chronic stage III kidney disease-check potassium and renal function.  Kirk Ruths, MD

## 2019-03-29 ENCOUNTER — Encounter: Payer: Self-pay | Admitting: Cardiology

## 2019-03-29 ENCOUNTER — Other Ambulatory Visit: Payer: Self-pay

## 2019-03-29 ENCOUNTER — Other Ambulatory Visit (HOSPITAL_COMMUNITY): Payer: Self-pay | Admitting: Internal Medicine

## 2019-03-29 ENCOUNTER — Ambulatory Visit (INDEPENDENT_AMBULATORY_CARE_PROVIDER_SITE_OTHER): Payer: No Typology Code available for payment source | Admitting: Cardiology

## 2019-03-29 VITALS — BP 96/62 | HR 60 | Ht 71.0 in | Wt 218.0 lb

## 2019-03-29 DIAGNOSIS — I480 Paroxysmal atrial fibrillation: Secondary | ICD-10-CM

## 2019-03-29 DIAGNOSIS — I429 Cardiomyopathy, unspecified: Secondary | ICD-10-CM | POA: Diagnosis not present

## 2019-03-29 DIAGNOSIS — I5042 Chronic combined systolic (congestive) and diastolic (congestive) heart failure: Secondary | ICD-10-CM | POA: Diagnosis not present

## 2019-03-29 DIAGNOSIS — I10 Essential (primary) hypertension: Secondary | ICD-10-CM

## 2019-03-29 MED ORDER — TORSEMIDE 20 MG PO TABS: 40.0000 mg | ORAL_TABLET | Freq: Two times a day (BID) | ORAL | 3 refills | Status: DC

## 2019-03-29 NOTE — Patient Instructions (Signed)
Medication Instructions:  INCREASE TORSEMIDE TO 60 MG TWICE DAILY FOR 3 DAYS AND THEN DECREASE TO 40 MG TWICE DAILY  *If you need a refill on your cardiac medications before your next appointment, please call your pharmacy*  Lab Work: Your physician recommends that you HAVE LAB [place]  Your physician recommends that you return for lab work in: [place]   If you have labs (blood work) drawn today and your tests are completely normal, you will receive your results only by: Marland Kitchen MyChart Message (if you have MyChart) OR . A paper copy in the mail If you have any lab test that is abnormal or we need to change your treatment, we will call you to review the results.  Follow-Up: At [facility], you and your health needs are our priority.  As part of our continuing mission to provide you with exceptional heart care, we have created designated Provider Care Teams.  These Care Teams include your primary Cardiologist (physician) and Advanced Practice Providers (APPs -  Physician Assistants and Nurse Practitioners) who all work together to provide you with the care you need, when you need it.  Your next appointment:   6-8 WEEKS  The format for your next appointment:   In Person  Provider:   Kirk Ruths, MD

## 2019-03-30 LAB — BASIC METABOLIC PANEL
BUN/Creatinine Ratio: 16 (ref 10–24)
BUN: 24 mg/dL (ref 8–27)
CO2: 23 mmol/L (ref 20–29)
Calcium: 9.6 mg/dL (ref 8.6–10.2)
Chloride: 100 mmol/L (ref 96–106)
Creatinine, Ser: 1.46 mg/dL — ABNORMAL HIGH (ref 0.76–1.27)
GFR calc Af Amer: 53 mL/min/{1.73_m2} — ABNORMAL LOW (ref >59)
GFR calc non Af Amer: 45 mL/min/{1.73_m2} — ABNORMAL LOW (ref >59)
Glucose: 102 mg/dL — ABNORMAL HIGH (ref 65–99)
Potassium: 4.2 mmol/L (ref 3.5–5.2)
Sodium: 138 mmol/L (ref 134–144)

## 2019-03-31 ENCOUNTER — Telehealth: Payer: Self-pay | Admitting: Cardiology

## 2019-03-31 ENCOUNTER — Encounter: Payer: Self-pay | Admitting: *Deleted

## 2019-03-31 NOTE — Telephone Encounter (Signed)
DPR on file. Left detailed message of result note info for BMP collected 10/28. Also stated letter of results was mailed to pt

## 2019-03-31 NOTE — Telephone Encounter (Signed)
Patient asking for lab results.

## 2019-04-03 ENCOUNTER — Telehealth (HOSPITAL_COMMUNITY): Payer: Self-pay

## 2019-04-03 NOTE — Telephone Encounter (Signed)
Received a Request for Medical Records from [facility]. Faxed to [phone] on 04/03/2019.

## 2019-04-06 ENCOUNTER — Other Ambulatory Visit: Payer: Self-pay

## 2019-04-06 ENCOUNTER — Ambulatory Visit (HOSPITAL_COMMUNITY)
Admission: RE | Admit: 2019-04-06 | Discharge: 2019-04-06 | Disposition: A | Discharge status: Home / Self Care | Payer: No Typology Code available for payment source | Source: Ambulatory Visit | Attending: Internal Medicine | Admitting: Internal Medicine

## 2019-04-06 VITALS — BP 96/72 | HR 65 | Wt 228.0 lb

## 2019-04-06 DIAGNOSIS — E8589 Other amyloidosis: Secondary | ICD-10-CM

## 2019-04-06 DIAGNOSIS — I951 Orthostatic hypotension: Secondary | ICD-10-CM | POA: Diagnosis not present

## 2019-04-06 DIAGNOSIS — Z880 Allergy status to penicillin: Secondary | ICD-10-CM | POA: Insufficient documentation

## 2019-04-06 DIAGNOSIS — I5022 Chronic systolic (congestive) heart failure: Secondary | ICD-10-CM | POA: Diagnosis present

## 2019-04-06 DIAGNOSIS — I130 Hypertensive heart and chronic kidney disease with heart failure and stage 1 through stage 4 chronic kidney disease, or unspecified chronic kidney disease: Secondary | ICD-10-CM | POA: Insufficient documentation

## 2019-04-06 DIAGNOSIS — E854 Organ-limited amyloidosis: Secondary | ICD-10-CM | POA: Insufficient documentation

## 2019-04-06 DIAGNOSIS — I2510 Atherosclerotic heart disease of native coronary artery without angina pectoris: Secondary | ICD-10-CM | POA: Insufficient documentation

## 2019-04-06 DIAGNOSIS — N183 Chronic kidney disease, stage 3 unspecified: Secondary | ICD-10-CM | POA: Insufficient documentation

## 2019-04-06 DIAGNOSIS — Z87891 Personal history of nicotine dependence: Secondary | ICD-10-CM | POA: Insufficient documentation

## 2019-04-06 DIAGNOSIS — Z888 Allergy status to other drugs, medicaments and biological substances status: Secondary | ICD-10-CM | POA: Insufficient documentation

## 2019-04-06 DIAGNOSIS — Z79899 Other long term (current) drug therapy: Secondary | ICD-10-CM | POA: Diagnosis not present

## 2019-04-06 DIAGNOSIS — Z7901 Long term (current) use of anticoagulants: Secondary | ICD-10-CM | POA: Insufficient documentation

## 2019-04-06 DIAGNOSIS — I5042 Chronic combined systolic (congestive) and diastolic (congestive) heart failure: Secondary | ICD-10-CM

## 2019-04-06 DIAGNOSIS — M1990 Unspecified osteoarthritis, unspecified site: Secondary | ICD-10-CM | POA: Diagnosis not present

## 2019-04-06 DIAGNOSIS — I43 Cardiomyopathy in diseases classified elsewhere: Secondary | ICD-10-CM | POA: Insufficient documentation

## 2019-04-06 DIAGNOSIS — I480 Paroxysmal atrial fibrillation: Secondary | ICD-10-CM | POA: Insufficient documentation

## 2019-04-06 LAB — BASIC METABOLIC PANEL
Anion gap: 13 (ref 5–15)
BUN/Creatinine Ratio: 20 (ref 10–24)
BUN: 31 mg/dL — ABNORMAL HIGH (ref 8–27)
BUN: 27 mg/dL — ABNORMAL HIGH (ref 8–23)
CO2: 23 mmol/L (ref 20–29)
CO2: 21 mmol/L — ABNORMAL LOW (ref 22–32)
Calcium: 9.4 mg/dL (ref 8.6–10.2)
Calcium: 9.3 mg/dL (ref 8.9–10.3)
Chloride: 99 mmol/L (ref 96–106)
Chloride: 101 mmol/L (ref 98–111)
Creatinine, Ser: 1.58 mg/dL — ABNORMAL HIGH (ref 0.76–1.27)
Creatinine, Ser: 1.51 mg/dL — ABNORMAL HIGH (ref 0.61–1.24)
GFR calc Af Amer: 48 mL/min/{1.73_m2} — ABNORMAL LOW (ref >59)
GFR calc Af Amer: 51 mL/min — ABNORMAL LOW (ref >60)
GFR calc non Af Amer: 41 mL/min/{1.73_m2} — ABNORMAL LOW (ref >59)
GFR calc non Af Amer: 44 mL/min — ABNORMAL LOW (ref >60)
Glucose, Bld: 134 mg/dL — ABNORMAL HIGH (ref 70–99)
Glucose: 130 mg/dL — ABNORMAL HIGH (ref 65–99)
Potassium: 4.4 mmol/L (ref 3.5–5.2)
Potassium: 4.1 mmol/L (ref 3.5–5.1)
Sodium: 138 mmol/L (ref 134–144)
Sodium: 135 mmol/L (ref 135–145)

## 2019-04-06 MED ORDER — MIDODRINE HCL 5 MG PO TABS: 5.0000 mg | ORAL_TABLET | Freq: Three times a day (TID) | ORAL | 6 refills | Status: DC

## 2019-04-06 MED ORDER — TORSEMIDE 20 MG PO TABS: ORAL_TABLET | ORAL | 3 refills | Status: DC

## 2019-04-06 NOTE — Patient Instructions (Signed)
Start Midodrine 5 mg Three times a day   Increase Torsemide to 60 mg (3 tabs) in AM and 40 mg (2 tabs) in PM  Labs done today  Labs again in 1 week  Your physician recommends that you schedule a follow-up appointment in: 8 weeks with echocardiogram.  WE HAVE COMPLETED THE VA REFERRAL FORM AND WILL SEND TO [place]  If you have any questions or concerns before your next appointment please send [name] a message through [place] or call our office at [phone].  At the [facility], you and your health needs are our priority. As part of our continuing mission to provide you with exceptional heart care, we have created designated Provider Care Teams. These Care Teams include your primary Cardiologist (physician) and Advanced Practice Providers (APPs- Physician Assistants and Nurse Practitioners) who all work together to provide you with the care you need, when you need it.   You may see any of the following providers on your designated Care Team at your next follow up: Marland Kitchen Dr Glori Bickers . Dr Loralie Champagne . Darrick Grinder, NP . Lyda Jester, PA   Please be sure to bring in all your medications bottles to every appointment.

## 2019-04-06 NOTE — Progress Notes (Signed)
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 Referred by Dr. Stanford Breed for further management of advanced HF.  He has been struggling with HF for some time. Echo in 2017 EF 35-40%.  PYP scan 2019 Grade III suggestive of transthyretin amyloidosis. In 2019 he was started on tafamidis.   He has been followed closely by Dr Stanford Breed.  Was admitted in 9/20 for volume overload and LE cellulitis. D/c weight 216 pounds.   He saw Dr. Stanford Breed last week and was volume overloaded. Torsemide increased to 60 bid for 3 days then back to 40 bid. Weight came down.   He has been referred to Particia Jasper at [facility] for further evaluation of his cardiac amyloid and possible study enrollment  Today he returns for follow up with his wife. Doing ok but gets SOB with minimal activity. No orthopnea or PND. + LE edema. Weight 218 at home. SBP running in 90s. Dizzy if he stands up too quickly. Has appt with Dr. Lora Paula at [facility] 11/17.  Studies:  ECHO 12/07/2018 -EF 35-40% RV normal, LA dilated, RA severely dilated.  ECHO 03/10/2018 EF 45-50% with  suspected amyloidosis.  Grade II DD, Severe hypertrophy ECHO 2017  EF 35-40% RA/LA dilated, EF 35-40%  Moderate hypertrophy    Stress Test at [facility] EF 41% no ischemia    Past Medical History:  Diagnosis Date  . Arthritis    "left foot" (06/01/2018)  . Atrial fibrillation ([place])   . CAD (coronary artery disease)   . Cardiomyopathy ([place])   . Cellulitis and abscess of left leg 02/08/2019  . Chronic systolic CHF (congestive heart failure) ([place]) 04/12/2016  . Dyspnea   . Dysrhythmia Atrial flutter  . 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 Hypertension     Current Outpatient Medications  Medication Sig  Dispense Refill  . apixaban (ELIQUIS) 5 MG TABS tablet Take 1 tablet (5 mg total) by mouth 2 (two) times daily. 60 tablet 6  . carvedilol (COREG) 6.25 MG tablet Take 3.125 mg by mouth 2 (two) times daily with a meal.    . dofetilide (TIKOSYN) 125 MCG capsule Take 1 capsule (125 mcg total) by mouth 2 (two) times daily. 60 capsule 6  . dorzolamide (TRUSOPT) 2 % ophthalmic solution Place 1 drop into both eyes 2 (two) times daily.    Marland Kitchen latanoprost (XALATAN) 0.005 % ophthalmic solution Place 1 drop into both eyes at bedtime.    . Multiple Vitamins-Minerals (ONE-A-DAY MENS 50+ ADVANTAGE) TABS Take 1 tablet by mouth daily with breakfast.    . Polyvinyl Alcohol (LIQUID TEARS OP) Place 1-2 drops into both eyes as needed (for dry eyes).    . potassium chloride SA (K-DUR) 20 MEQ tablet Take 2 tablets (40 mEq total) by mouth every morning AND 1 tablet (20 mEq total) every evening. (Patient taking differently: Take 20 mEq by mouth in the morning and 20 mEq at bedtime) 270 tablet 3  . Tafamidis 61 MG CAPS Take 61 mg by mouth daily after breakfast.     . torsemide (DEMADEX) 20 MG tablet Take 2 tablets (40 mg total) by mouth 2 (two) times daily. 360 tablet 3   No current facility-administered medications for this encounter.  Allergies  Allergen Reactions  . Pradaxa [Dabigatran Etexilate Mesylate] Nausea Only  . Penicillins Rash    Did it involve swelling of the face/tongue/throat, SOB, or low BP? No Did it involve sudden or severe rash/hives, skin peeling, or any reaction on the inside of your mouth or nose? No Did you need to seek medical attention at a hospital or doctor's office? Unk When did it last happen? "it happened many years ago" If all above answers are "NO", may proceed with cephalosporin use.       Social History   Socioeconomic History  . Marital status: Married    Spouse name: Not on file  . Number of children: 2  . Years of education: Not on file  . Highest education level: Not  on file  Occupational History  . Not on file  Social Needs  . Financial resource strain: Not on file  . Food insecurity    Worry: Not on file    Inability: Not on file  . Transportation needs    Medical: Not on file    Non-medical: Not on file  Tobacco Use  . Smoking status: Former Smoker    Packs/day: 0.12    Types: Cigarettes    Quit date: 1990    Years since quitting: 30.8  . Smokeless tobacco: Never Used  . Tobacco comment: 06/01/2018 "smoked off and on for 30 years; don't know how many years I actually smoked"  Substance and Sexual Activity  . Alcohol use: Not Currently    Comment: \  . Drug use: Never  . Sexual activity: Not on file  Lifestyle  . Physical activity    Days per week: Not on file    Minutes per session: Not on file  . Stress: Not on file  Relationships  . Social Herbalist on phone: Not on file    Gets together: Not on file    Attends religious service: Not on file    Active member of club or organization: Not on file    Attends meetings of clubs or organizations: Not on file    Relationship status: Not on file  . Intimate partner violence    Fear of current or ex partner: Not on file    Emotionally abused: Not on file    Physically abused: Not on file    Forced sexual activity: Not on file  Other Topics Concern  . Not on file  Social History Narrative  . Not on file      Family History  Problem Relation Age of Onset  . Heart disease Other        No family history     Vitals:   04/06/19 1158  BP: 96/72  Pulse: 65  SpO2: 95%  Weight: 103.4 kg   Wt Readings from Last 3 Encounters:  04/06/19 103.4 kg (228 lb)  03/29/19 98.9 kg (218 lb)  03/14/19 107 kg (236 lb)    PHYSICAL EXAM: General:  Well appearing. No resp difficulty HEENT: normal Neck: supple. JVP to ear Carotids 2+ bilat; no bruits. No lymphadenopathy or thryomegaly appreciated. Cor: PMI nondisplaced. Regular rate & rhythm.2/6 TR Lungs: clear Abdomen: soft,  nontender, nondistended. No hepatosplenomegaly. No bruits or masses. Good bowel sounds. Extremities: no cyanosis, clubbing, rash, 1+ R 2+L edema Neuro: alert & orientedx3, cranial nerves grossly intact. moves all 4 extremities w/o difficulty. Affect pleasant   ASSESSMENT & PLAN:  1. 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 - Stable NYHA III symptoms - Will increase torsemide to 60/40 and follow labs  - BP low start midodrine 5 tid - For now continue low dose carvedilol.  - BP has been unable to tolerate meds in past. If BP stable can try low-dose Entresto at some point but I suspect he may not tolerate - He has been on spiro in the past but stopped due to CKD. nt. - Given progressive symptoms have strongly suggested considering enrolling at gene silencer study at [facility] with Dr. Lora Paula. He is scheduled for 04/18/19 - Has been referred to [facility] for family screening - Repeat echo next visit  2. Cardiac Amyloidosis - Familial TTR - PYP Grade III strongly suggestive of TTR- Started on Tafamidis in 2019.  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 CKD Stage III - Creatinine baseline 1.3-1.4  - Most recent creatinine 1.5 on 12/19/18  - Check BMET today and 1 week  4. PAF  - Remains in NSR on Tikosyn. Follows with Dr. Stanford Breed  - Continue Eliquis    Glori Bickers MD  12:01 PM

## 2019-04-06 NOTE — Addendum Note (Signed)
Encounter addended by: Scarlette Calico, RN on: 04/06/2019 12:39 PM  Actions taken: Order list changed, Diagnosis association updated, Charge Capture section accepted, Clinical Note Signed

## 2019-04-07 ENCOUNTER — Encounter: Payer: Self-pay | Admitting: *Deleted

## 2019-04-10 ENCOUNTER — Encounter (HOSPITAL_COMMUNITY): Payer: Self-pay | Admitting: *Deleted

## 2019-04-10 NOTE — Progress Notes (Signed)
Pt has been referred to see Dr Lora Paula at [facility] for his cardiac amyloid and is scheduled to be seen 04/18/2019.    He has [employer] and they require a PA for the referral  Non-VA referral form completed and faxed along with last OV note to [place] at [phone].  Pt's wife is aware to not take pt to appt 11/17 unless we get the approval before then.

## 2019-04-12 ENCOUNTER — Telehealth: Payer: Self-pay | Admitting: Cardiology

## 2019-04-12 NOTE — Telephone Encounter (Signed)
Returned call to wife she states that there is blood in urine. She will continue Eliquis and call PCP (he is at the [place]). She will take to the Er if she feels needed.

## 2019-04-12 NOTE — Telephone Encounter (Signed)
Pt wife notified to hold eliquis she will call [place] tomorrow

## 2019-04-12 NOTE — Telephone Encounter (Signed)
If he continues with hematuria would hold apixaban Kirk Ruths

## 2019-04-12 NOTE — Telephone Encounter (Signed)
Please call, wife states patient has blood in urine. In hospital recently, can't walk by himself. Leg is swollen

## 2019-04-13 ENCOUNTER — Other Ambulatory Visit: Payer: Self-pay

## 2019-04-13 ENCOUNTER — Ambulatory Visit (HOSPITAL_COMMUNITY)
Admission: RE | Admit: 2019-04-13 | Discharge: 2019-04-13 | Disposition: A | Discharge status: Home / Self Care | Payer: No Typology Code available for payment source | Source: Ambulatory Visit | Attending: Internal Medicine | Admitting: Internal Medicine

## 2019-04-13 ENCOUNTER — Ambulatory Visit (HOSPITAL_BASED_OUTPATIENT_CLINIC_OR_DEPARTMENT_OTHER)
Admission: RE | Admit: 2019-04-13 | Discharge: 2019-04-13 | Disposition: A | Discharge status: Home / Self Care | Payer: No Typology Code available for payment source | Source: Ambulatory Visit | Attending: Internal Medicine | Admitting: Internal Medicine

## 2019-04-13 ENCOUNTER — Encounter (HOSPITAL_COMMUNITY): Payer: Self-pay | Admitting: *Deleted

## 2019-04-13 DIAGNOSIS — I480 Paroxysmal atrial fibrillation: Secondary | ICD-10-CM | POA: Diagnosis not present

## 2019-04-13 DIAGNOSIS — Z79899 Other long term (current) drug therapy: Secondary | ICD-10-CM | POA: Insufficient documentation

## 2019-04-13 DIAGNOSIS — Z87891 Personal history of nicotine dependence: Secondary | ICD-10-CM | POA: Insufficient documentation

## 2019-04-13 DIAGNOSIS — R319 Hematuria, unspecified: Secondary | ICD-10-CM

## 2019-04-13 DIAGNOSIS — I43 Cardiomyopathy in diseases classified elsewhere: Secondary | ICD-10-CM | POA: Insufficient documentation

## 2019-04-13 DIAGNOSIS — M25562 Pain in left knee: Secondary | ICD-10-CM

## 2019-04-13 DIAGNOSIS — I130 Hypertensive heart and chronic kidney disease with heart failure and stage 1 through stage 4 chronic kidney disease, or unspecified chronic kidney disease: Secondary | ICD-10-CM | POA: Diagnosis not present

## 2019-04-13 DIAGNOSIS — I5023 Acute on chronic systolic (congestive) heart failure: Secondary | ICD-10-CM | POA: Insufficient documentation

## 2019-04-13 DIAGNOSIS — I5022 Chronic systolic (congestive) heart failure: Secondary | ICD-10-CM

## 2019-04-13 DIAGNOSIS — N183 Chronic kidney disease, stage 3 unspecified: Secondary | ICD-10-CM | POA: Diagnosis not present

## 2019-04-13 DIAGNOSIS — Z7901 Long term (current) use of anticoagulants: Secondary | ICD-10-CM | POA: Insufficient documentation

## 2019-04-13 DIAGNOSIS — E854 Organ-limited amyloidosis: Secondary | ICD-10-CM | POA: Insufficient documentation

## 2019-04-13 DIAGNOSIS — M1000 Idiopathic gout, unspecified site: Secondary | ICD-10-CM | POA: Diagnosis not present

## 2019-04-13 DIAGNOSIS — I2510 Atherosclerotic heart disease of native coronary artery without angina pectoris: Secondary | ICD-10-CM | POA: Insufficient documentation

## 2019-04-13 LAB — URINALYSIS, ROUTINE W REFLEX MICROSCOPIC
Bilirubin Urine: NEGATIVE
Glucose, UA: NEGATIVE mg/dL
Hgb urine dipstick: NEGATIVE
Ketones, ur: NEGATIVE mg/dL
Leukocytes,Ua: NEGATIVE
Nitrite: NEGATIVE
Protein, ur: 30 mg/dL — AB
Specific Gravity, Urine: 1.01 (ref 1.005–1.030)
pH: 5 (ref 5.0–8.0)

## 2019-04-13 LAB — BASIC METABOLIC PANEL
Anion gap: 9 (ref 5–15)
BUN: 22 mg/dL (ref 8–23)
CO2: 25 mmol/L (ref 22–32)
Calcium: 9 mg/dL (ref 8.9–10.3)
Chloride: 101 mmol/L (ref 98–111)
Creatinine, Ser: 1.6 mg/dL — ABNORMAL HIGH (ref 0.61–1.24)
GFR calc Af Amer: 47 mL/min — ABNORMAL LOW (ref >60)
GFR calc non Af Amer: 41 mL/min — ABNORMAL LOW (ref >60)
Glucose, Bld: 114 mg/dL — ABNORMAL HIGH (ref 70–99)
Potassium: 4.3 mmol/L (ref 3.5–5.1)
Sodium: 135 mmol/L (ref 135–145)

## 2019-04-13 LAB — URIC ACID: Uric Acid, Serum: 11.5 mg/dL — ABNORMAL HIGH (ref 3.7–8.6)

## 2019-04-13 MED ORDER — METOLAZONE 2.5 MG PO TABS: 2.5000 mg | ORAL_TABLET | ORAL | 3 refills | Status: DC

## 2019-04-13 MED ORDER — PREDNISONE 20 MG PO TABS: 40.0000 mg | ORAL_TABLET | Freq: Every day | ORAL | 0 refills | Status: AC

## 2019-04-13 NOTE — Patient Instructions (Signed)
Take Metolazone 2.5 mg ONCE, then only take as directed by [name] for swelling  Anytime you take Metolazone take an extra 20 meq (1 tab) of Potassium  Take Prednisone 40 mg (2 tabs) daily FOR 3 DAYS

## 2019-04-13 NOTE — Progress Notes (Signed)
Pt in for lab work today and c/o blood in urine and swollen/painful L knee.  Pt added onto schedule for Dr Haroldine Laws to see.

## 2019-04-14 ENCOUNTER — Telehealth (HOSPITAL_COMMUNITY): Payer: Self-pay | Admitting: *Deleted

## 2019-04-14 NOTE — Telephone Encounter (Signed)
[facility] called stating patient does not have approval from [place] for Tuesday appt at [facility]. RN stated patient called her and said their was a form sent from [place] to [place] to sign and fax back to get approval for [place] office visit. I told her we did not have a form from [place] and normally the pts pcp at the [place] would have to refer patient to [place] in order for visit to be covered. VA plan normally requires all referrals to come from PCP not specialists. I called pts wife to get more information no answer/left VM.

## 2019-04-14 NOTE — Telephone Encounter (Signed)
We are referring pt to [facility] and have sent a NON-VA referral form w/reasons for the referral and Dr Bensimhon's OV note to the [facility] for their approval and am waiting to hear back from them.  Will attempt to call VA to see if it has been approved yet.

## 2019-04-14 NOTE — Progress Notes (Addendum)
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 Referred by Dr. Stanford Breed for further management of advanced HF.  He has been struggling with HF for some time. Echo in 2017 EF 35-40%.  PYP scan 2019 Grade III suggestive of transthyretin amyloidosis. In 2019 he was started on tafamidis.   He has been followed closely by Dr Stanford Breed.  Was admitted in 9/20 for volume overload and LE cellulitis. D/c weight 216 pounds.   He saw Dr. Stanford Breed last week and was volume overloaded. Torsemide increased to 60 bid for 3 days then back to 40 bid. Weight came down.   He has been referred to Particia Jasper at [facility] for further evaluation of his cardiac amyloid and possible study enrollment  Today he returns for unscheduled visit with his wife due to progressive LE edema and acute left knee pain. Says left knee is very sore. Hard to bend. Denies trauma. No fevers or erythema. Also has worsening LE edema L>R. + SOB on minimal exertion. Has been complaint with meds including his diuretics and tafamadis. Has appt with Dr. Lora Paula at [facility] 11/17. Also c/o of hematuria. New-onset. Denies flank pain, dysuria or h/o kidney stones.   Studies:  ECHO 12/07/2018 -EF 35-40% RV normal, LA dilated, RA severely dilated.  ECHO 03/10/2018 EF 45-50% with  suspected amyloidosis.  Grade II DD, Severe hypertrophy ECHO 2017  EF 35-40% RA/LA dilated, EF 35-40%  Moderate hypertrophy    Stress Test at [facility] EF 41% no ischemia    Past Medical History:  Diagnosis Date  . Arthritis    "left foot" (06/01/2018)  . Atrial fibrillation ([place])   . CAD (coronary artery disease)   . Cardiomyopathy ([place])   . Cellulitis and abscess of left leg 02/08/2019  . Chronic systolic CHF (congestive heart failure) ([place])  04/12/2016  . Dyspnea   . Dysrhythmia Atrial flutter  . 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 Hypertension     Current Outpatient Medications  Medication Sig Dispense Refill  . apixaban (ELIQUIS) 5 MG TABS tablet Take 1 tablet (5 mg total) by mouth 2 (two) times daily. 60 tablet 6  . carvedilol (COREG) 6.25 MG tablet Take 3.125 mg by mouth 2 (two) times daily with a meal.    . dofetilide (TIKOSYN) 125 MCG capsule Take 1 capsule (125 mcg total) by mouth 2 (two) times daily. 60 capsule 6  . dorzolamide (TRUSOPT) 2 % ophthalmic solution Place 1 drop into both eyes 2 (two) times daily.    Marland Kitchen latanoprost (XALATAN) 0.005 % ophthalmic solution Place 1 drop into both eyes at bedtime.    . metolazone (ZAROXOLYN) 2.5 MG tablet Take 1 tablet (2.5 mg total) by mouth as directed. By HF clinic for swelling 10 tablet 3  . midodrine (PROAMATINE) 5 MG tablet Take 1 tablet (5 mg total) by mouth 3 (three) times daily with meals. 90 tablet 6  . Multiple Vitamins-Minerals (ONE-A-DAY MENS 50+ ADVANTAGE) TABS Take 1 tablet by mouth daily with breakfast.    . Polyvinyl Alcohol (LIQUID TEARS OP) Place 1-2 drops into both eyes as needed (for dry eyes).    . potassium chloride SA (K-DUR) 20 MEQ tablet Take 2 tablets (40 mEq total) by mouth  every morning AND 1 tablet (20 mEq total) every evening. (Patient taking differently: Take 20 mEq by mouth in the morning and 20 mEq at bedtime) 270 tablet 3  . predniSONE (DELTASONE) 20 MG tablet Take 2 tablets (40 mg total) by mouth daily with breakfast for 3 days. 6 tablet 0  . Tafamidis 61 MG CAPS Take 61 mg by mouth daily after breakfast.     . torsemide (DEMADEX) 20 MG tablet Take 3 tablets (60 mg total) by mouth every morning AND 2 tablets (40 mg total) every evening. 360 tablet 3   No current facility-administered medications for this encounter.     Allergies  Allergen Reactions  . Pradaxa [Dabigatran Etexilate Mesylate] Nausea Only   . Penicillins Rash    Did it involve swelling of the face/tongue/throat, SOB, or low BP? No Did it involve sudden or severe rash/hives, skin peeling, or any reaction on the inside of your mouth or nose? No Did you need to seek medical attention at a hospital or doctor's office? Unk When did it last happen? "it happened many years ago" If all above answers are "NO", may proceed with cephalosporin use.       Social History   Socioeconomic History  . Marital status: Married    Spouse name: Not on file  . Number of children: 2  . Years of education: Not on file  . Highest education level: Not on file  Occupational History  . Not on file  Social Needs  . Financial resource strain: Not on file  . Food insecurity    Worry: Not on file    Inability: Not on file  . Transportation needs    Medical: Not on file    Non-medical: Not on file  Tobacco Use  . Smoking status: Former Smoker    Packs/day: 0.12    Types: Cigarettes    Quit date: 1990    Years since quitting: 30.8  . Smokeless tobacco: Never Used  . Tobacco comment: 06/01/2018 "smoked off and on for 30 years; don't know how many years I actually smoked"  Substance and Sexual Activity  . Alcohol use: Not Currently    Comment: \  . Drug use: Never  . Sexual activity: Not on file  Lifestyle  . Physical activity    Days per week: Not on file    Minutes per session: Not on file  . Stress: Not on file  Relationships  . Social Herbalist on phone: Not on file    Gets together: Not on file    Attends religious service: Not on file    Active member of club or organization: Not on file    Attends meetings of clubs or organizations: Not on file    Relationship status: Not on file  . Intimate partner violence    Fear of current or ex partner: Not on file    Emotionally abused: Not on file    Physically abused: Not on file    Forced sexual activity: Not on file  Other Topics Concern  . Not on file  Social History  Narrative  . Not on file      Family History  Problem Relation Age of Onset  . Heart disease Other        No family history     There were no vitals filed for this visit. Wt Readings from Last 3 Encounters:  04/06/19 103.4 kg (228 lb)  03/29/19 98.9 kg (218 lb)  03/14/19 107 kg (236 lb)    PHYSICAL EXAM: General:  Obese male sitting in WC No resp difficulty HEENT: normal Neck: supple. JVP to jaw Carotids 2+ bilat; no bruits. No lymphadenopathy or thryomegaly appreciated. Cor: PMI nondisplaced. Regular rate & rhythm. No rubs, gallops or murmurs. Lungs: clear Abdomen: obese soft, nontender, nondistended. No hepatosplenomegaly. No bruits or masses. Good bowel sounds. Extremities: no cyanosis, clubbing, rash, 1+ on R 2-3+ L edema Left knee swollen. No cellulitis or streaking. Painful to touch Neuro: alert & orientedx3, cranial nerves grossly intact. moves all 4 extremities w/o difficulty. Affect pleasant    ASSESSMENT & PLAN:  1. Acute on Chronic Systolic HF due to TTR cardiac amyloidois - ECHO 12/07/2018-EF 35-40% RV normal, LA dilated, RA severely dilated. - Stable NYHA III symptoms - Continue torsemide 60/40  - Add metolazone 2.5 take today and up to 2x/week to deal with volume overload.  - Watch dietary intak - Continue midodrine 5 tid for BP support - For now continue low dose carvedilol - BP has been unable to tolerate meds in past. If BP stable can try low-dose Entresto at some point but I suspect he may not tolerate - He has been on spiro in the past but stopped due to CKD - Given progressive symptoms have strongly suggested considering enrolling at gene silencer study at [facility] with Dr. Lora Paula. He is scheduled for 04/18/19 - Has been referred to [facility] for family screening - Repeat echo next visit  2. Left knee pain - suspect acute gout.  - uric acid checked and is elevated at 11 - will give sterid burst 40mg  x 3 days. Watch fluid - start allopurinol when  under control  3 Cardiac Amyloidosis - Familial TTR - PYP Grade III strongly suggestive of TTR- Started on Tafamidis in 2019.  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 CKD Stage III - Creatinine baseline 1.3-1.4  - Most recent creatinine 1.5 on 12/19/18  - Check BMET today and 1 week. Watch closely with metolazone  5. PAF  - Remains in NSR on Tikosyn. Follows with Dr. Stanford Breed  - Continue Eliquis   6. Hematuria - check UA and cytology - denies flank pain or history of stones. Send to Urology   Glori Bickers MD  2:09 PM

## 2019-04-14 NOTE — Telephone Encounter (Signed)
Contacted Naomi at the [place], she states the pt's referral to [facility] is still in process and that a care coordinator will be contacting pt either Mon or Tue to f/u so he will not have approval before his 11/17 appt.  Called and spoke w/pt and his wife, they are aware and ask that I call [facility] to let them know, their contact there is Arrie Aran [phone], called and left her a detailed VM that pt's appt needs to be resch out a couple of weeks.  Pt and wife also aware of lab results from yesterday.

## 2019-04-14 NOTE — Addendum Note (Signed)
Encounter addended by: Jolaine Artist, MD on: 04/14/2019 2:20 PM  Actions taken: Level of Service modified, Visit diagnoses modified, Clinical Note Signed

## 2019-04-19 ENCOUNTER — Ambulatory Visit (HOSPITAL_COMMUNITY)
Admission: RE | Admit: 2019-04-19 | Discharge: 2019-04-19 | Disposition: A | Discharge status: Home / Self Care | Payer: No Typology Code available for payment source | Source: Ambulatory Visit | Attending: Cardiology | Admitting: Cardiology

## 2019-04-19 ENCOUNTER — Other Ambulatory Visit: Payer: Self-pay

## 2019-04-19 ENCOUNTER — Inpatient Hospital Stay (HOSPITAL_COMMUNITY): Admission: RE | Admit: 2019-04-19 | Payer: No Typology Code available for payment source | Source: Ambulatory Visit

## 2019-04-19 ENCOUNTER — Telehealth (HOSPITAL_COMMUNITY): Payer: Self-pay | Admitting: *Deleted

## 2019-04-19 DIAGNOSIS — R609 Edema, unspecified: Secondary | ICD-10-CM | POA: Insufficient documentation

## 2019-04-19 NOTE — Telephone Encounter (Signed)
LATE ENTRY from 11/17 5:00 pm:  Pt's wife called with concerns and multiple issues   Pt's L knee is now better but the pain has moved down into his calf, wife states it is very painful and swollen, when she runs her hand from front to back of calf it is warm to touch and she feels like it is red.  Pt has been off Eliquis since last week (stopped by Dr [name] office for hematuria) and per pcp at the [facility] pt is to remain off it until he sees urology (they are working on getting him an appt this week)  PER DR BENSIMHON: restart Eliquis tonight (11/17 pm) and get LE ven duplex asap on 11/18 AM, if symptoms worsen or dev increased SOB come to ER.  Pt's wife is aware and agreeable, will call her 11/18 AM w/time for u/s  She also states the [place] told her pt's ref to [place] has been approved and that they faxed the approval to [facility] at Dr Minerva Fester office.  She states she is going to call [facility] to make sure she got the approval and to resch his appt.

## 2019-04-19 NOTE — Telephone Encounter (Signed)
Pt is sch for LE ven duplex at 11 am today at the [facility] office, wife and pt aware

## 2019-04-19 NOTE — Telephone Encounter (Signed)
LE Ven negative for DVT but does show 2 areas of fluid collection, per Dr Haroldine Laws may need to see ortho, send to [place] for their input.  Called and spoke w/pt's wife she is aware and agreeable.  Pt's new pcp at the [facility] is Dr Renetta Chalk, called the [place] at [phone] and they provided me with a fax number of [phone], faxed u/s to her.  Pt's wife states she will call them tomorrow to f/u.  She does state pt got an appt w/urology on 12/16.

## 2019-05-08 NOTE — Progress Notes (Deleted)
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 Had stress test 8/17 at [facility] that showed EF 41 and no ischemia. Pt admitted 11/17 with dyspnea and right pleural effusion. Had thoracentesis and cytology negative; diuresed. TEE 11/17 showed EF 35-40, biatrial enlargement and moderate TR. Echocardiogram October 2019 showed ejection fraction 45 to 50%; severe left ventricular hypertrophy and amyloid should be considered; moderate diastolic dysfunction; mildly dilated ascending aorta, biatrial enlargement and mild tricuspid regurgitation. CTA October 2019 showed large right pleural effusion. 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 Patient admitted September 2020 with lower extremity cellulitis and volume excess.  Patient was treated with antibiotics and diuretics.  Since last seen,   Current Outpatient Medications  Medication Sig Dispense Refill  . apixaban (ELIQUIS) 5 MG TABS tablet Take 1 tablet (5 mg total) by mouth 2 (two) times daily. 60 tablet 6  . carvedilol (COREG) 6.25 MG tablet Take 3.125 mg by mouth 2 (two) times daily with a meal.    . dofetilide (TIKOSYN) 125 MCG capsule Take 1 capsule (125 mcg total) by mouth 2 (two) times daily. 60 capsule 6  . dorzolamide (TRUSOPT) 2 % ophthalmic solution Place 1 drop into both eyes 2 (two) times daily.    Marland Kitchen latanoprost (XALATAN) 0.005 % ophthalmic solution Place 1 drop into both eyes at bedtime.    . metolazone (ZAROXOLYN) 2.5 MG tablet Take 1 tablet (2.5 mg total) by mouth as directed. By HF clinic for swelling 10 tablet 3  . midodrine (PROAMATINE) 5 MG tablet Take 1 tablet (5 mg total) by mouth 3 (three) times daily with meals. 90 tablet 6  . Multiple Vitamins-Minerals (ONE-A-DAY MENS 50+ ADVANTAGE) TABS  Take 1 tablet by mouth daily with breakfast.    . Polyvinyl Alcohol (LIQUID TEARS OP) Place 1-2 drops into both eyes as needed (for dry eyes).    . potassium chloride SA (K-DUR) 20 MEQ tablet Take 2 tablets (40 mEq total) by mouth every morning AND 1 tablet (20 mEq total) every evening. (Patient taking differently: Take 20 mEq by mouth in the morning and 20 mEq at bedtime) 270 tablet 3  . Tafamidis 61 MG CAPS Take 61 mg by mouth daily after breakfast.     . torsemide (DEMADEX) 20 MG tablet Take 3 tablets (60 mg total) by mouth every morning AND 2 tablets (40 mg total) every evening. 360 tablet 3   No current facility-administered medications for this visit.      Past Medical History:  Diagnosis Date  . Arthritis    "left foot" (06/01/2018)  . Atrial fibrillation ([place])   . CAD (coronary artery disease)   . Cardiomyopathy ([place])   . Cellulitis and abscess of left leg 02/08/2019  . Chronic systolic CHF (congestive heart failure) ([place]) 04/12/2016  . Dyspnea   . Dysrhythmia Atrial flutter  . 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 Hypertension     Past Surgical History:  Procedure Laterality Date  . CARDIOVERSION N/A 09/04/2015   Procedure: CARDIOVERSION;  Surgeon: Skeet Latch, MD;  Location: [place];  Service: Cardiovascular;  Laterality: N/A;  . IR THORACENTESIS ASP PLEURAL SPACE W/IMG GUIDE  06/02/2018  . TEE WITHOUT CARDIOVERSION N/A 09/04/2015   Procedure: TRANSESOPHAGEAL ECHOCARDIOGRAM (TEE);  Surgeon: Skeet Latch, MD;  Location: [place];  Service: Cardiovascular;  Laterality: N/A;  . TEE WITHOUT CARDIOVERSION N/A 04/15/2016   Procedure: TRANSESOPHAGEAL ECHOCARDIOGRAM (TEE);  Surgeon: Jerline Pain, MD;  Location: [facility] ENDOSCOPY;  Service: Cardiovascular;  Laterality: N/A;  . TOE AMPUTATION Right    4th digit    Social History   Socioeconomic History  . Marital status: Married    Spouse name: Not on file  . Number of children:  2  . Years of education: Not on file  . Highest education level: Not on file  Occupational History  . Not on file  Social Needs  . Financial resource strain: Not on file  . Food insecurity    Worry: Not on file    Inability: Not on file  . Transportation needs    Medical: Not on file    Non-medical: Not on file  Tobacco Use  . Smoking status: Former Smoker    Packs/day: 0.12    Types: Cigarettes    Quit date: 1990    Years since quitting: 30.9  . Smokeless tobacco: Never Used  . Tobacco comment: 06/01/2018 "smoked off and on for 30 years; don't know how many years I actually smoked"  Substance and Sexual Activity  . Alcohol use: Not Currently    Comment: \  . Drug use: Never  . Sexual activity: Not on file  Lifestyle  . Physical activity    Days per week: Not on file    Minutes per session: Not on file  . Stress: Not on file  Relationships  . Social Herbalist on phone: Not on file    Gets together: Not on file    Attends religious service: Not on file    Active member of club or organization: Not on file    Attends meetings of clubs or organizations: Not on file    Relationship status: Not on file  . Intimate partner violence    Fear of current or ex partner: Not on file    Emotionally abused: Not on file    Physically abused: Not on file    Forced sexual activity: Not on file  Other Topics Concern  . Not on file  Social History Narrative  . Not on file    Family History  Problem Relation Age of Onset  . Heart disease Other        No family history    ROS: no fevers or chills, productive cough, hemoptysis, dysphasia, odynophagia, melena, hematochezia, dysuria, hematuria, rash, seizure activity, orthopnea, PND, pedal edema, claudication. Remaining systems are negative.  Physical Exam: Well-developed well-nourished in no acute distress.  Skin is warm and dry.  HEENT is normal.  Neck is supple.  Chest is clear to auscultation with normal expansion.   Cardiovascular exam is regular rate and rhythm.  Abdominal exam nontender or distended. No masses palpated. Extremities show no edema. 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  Patient being evaluated at [facility] for gene silencer study.  3 paroxysmal atrial fibrillation-patient remains in sinus rhythm today.  Continue Tikosyn.  Check BUN and creatinine.  We will likely need to discontinue Tikosyn in the near future and treat with amiodarone.  Continue apixaban.  4 hypertension-blood pressure controlled.  Continue present medical regimen.  5 cardiomyopathy-continue beta-blocker.  6 coronary artery disease-based on previous CT scan showing coronary calcification.  Continue statin.  He is not on aspirin  given need for apixaban.  7 chronic stage III kidney disease-check potassium and renal function.  Kirk Ruths, MD

## 2019-05-11 ENCOUNTER — Ambulatory Visit: Payer: No Typology Code available for payment source | Admitting: Cardiology

## 2019-05-12 ENCOUNTER — Encounter: Payer: Self-pay | Admitting: Cardiology

## 2019-05-12 ENCOUNTER — Other Ambulatory Visit: Payer: Self-pay

## 2019-05-12 ENCOUNTER — Ambulatory Visit (INDEPENDENT_AMBULATORY_CARE_PROVIDER_SITE_OTHER): Payer: No Typology Code available for payment source | Admitting: Cardiology

## 2019-05-12 VITALS — BP 100/62 | HR 64 | Ht 71.5 in | Wt 230.2 lb

## 2019-05-12 DIAGNOSIS — I429 Cardiomyopathy, unspecified: Secondary | ICD-10-CM

## 2019-05-12 DIAGNOSIS — I43 Cardiomyopathy in diseases classified elsewhere: Secondary | ICD-10-CM

## 2019-05-12 DIAGNOSIS — I2510 Atherosclerotic heart disease of native coronary artery without angina pectoris: Secondary | ICD-10-CM

## 2019-05-12 DIAGNOSIS — I5022 Chronic systolic (congestive) heart failure: Secondary | ICD-10-CM | POA: Diagnosis not present

## 2019-05-12 DIAGNOSIS — E854 Organ-limited amyloidosis: Secondary | ICD-10-CM | POA: Diagnosis not present

## 2019-05-12 NOTE — Patient Instructions (Signed)
Medication Instructions:  NO CHANGE *If you need a refill on your cardiac medications before your next appointment, please call your pharmacy*  Lab Work: Your physician recommends that you return for lab work in [place]  If you have labs (blood work) drawn today and your tests are completely normal, you will receive your results only by: Marland Kitchen MyChart Message (if you have MyChart) OR . A paper copy in the mail If you have any lab test that is abnormal or we need to change your treatment, we will call you to review the results.  Follow-Up: At [facility], you and your health needs are our priority.  As part of our continuing mission to provide you with exceptional heart care, we have created designated Provider Care Teams.  These Care Teams include your primary Cardiologist (physician) and Advanced Practice Providers (APPs -  Physician Assistants and Nurse Practitioners) who all work together to provide you with the care you need, when you need it.  Your next appointment:   1 month(s)  The format for your next appointment:   In Person  Provider:   Kirk Ruths, MD

## 2019-05-12 NOTE — Progress Notes (Signed)
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 Had stress test 8/17 at [facility] that showed EF 41 and no ischemia. Pt admitted 11/17 with dyspnea and right pleural effusion. Had thoracentesis and cytology negative; diuresed. TEE 11/17 showed EF 35-40, biatrial enlargement and moderate TR. Echocardiogram October 2019 showed ejection fraction 45 to 50%; severe left ventricular hypertrophy and amyloid should be considered; moderate diastolic dysfunction; mildly dilated ascending aorta, biatrial enlargement and mild tricuspid regurgitation. CTA October 2019 showed large right pleural effusion. 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 Patient admitted September 2020 with lower extremity cellulitis and volume excess.  Patient was treated with antibiotics and diuretics.  Since last seen, he has days when he feels somewhat dyspneic and fatigued and other days when he feels better. He has chronic lower extremity edema. No chest pain, palpitations or syncope.  Current Outpatient Medications  Medication Sig Dispense Refill  . apixaban (ELIQUIS) 5 MG TABS tablet Take 1 tablet (5 mg total) by mouth 2 (two) times daily. 60 tablet 6  . carvedilol (COREG) 6.25 MG tablet Take 3.125 mg by mouth 2 (two) times daily with a meal.    . dofetilide (TIKOSYN) 125 MCG capsule Take 1 capsule (125 mcg total) by mouth 2 (two) times daily. 60 capsule 6  . dorzolamide (TRUSOPT) 2 % ophthalmic solution Place 1 drop into both eyes 2 (two) times daily.    Marland Kitchen latanoprost (XALATAN) 0.005 % ophthalmic solution Place 1 drop into both eyes at bedtime.    . metolazone (ZAROXOLYN) 2.5 MG tablet Take 1 tablet (2.5 mg total) by mouth as directed. By HF clinic for swelling 10 tablet 3  . Multiple  Vitamins-Minerals (ONE-A-DAY MENS 50+ ADVANTAGE) TABS Take 1 tablet by mouth daily with breakfast.    . Polyvinyl Alcohol (LIQUID TEARS OP) Place 1-2 drops into both eyes as needed (for dry eyes).    . potassium chloride SA (K-DUR) 20 MEQ tablet Take 2 tablets (40 mEq total) by mouth every morning AND 1 tablet (20 mEq total) every evening. (Patient taking differently: Take 20 mEq by mouth in the morning and 20 mEq at bedtime) 270 tablet 3  . Tafamidis 61 MG CAPS Take 61 mg by mouth daily after breakfast.     . torsemide (DEMADEX) 20 MG tablet Take 3 tablets (60 mg total) by mouth every morning AND 2 tablets (40 mg total) every evening. 360 tablet 3  . midodrine (PROAMATINE) 5 MG tablet Take 1 tablet (5 mg total) by mouth 3 (three) times daily with meals. (Patient not taking: Reported on 05/12/2019) 90 tablet 6   No current facility-administered medications for this visit.     Past Medical History:  Diagnosis Date  . Arthritis    "left foot" (06/01/2018)  . Atrial fibrillation ([place])   . CAD (coronary artery disease)   . Cardiomyopathy ([place])   . Cellulitis and abscess of left leg 02/08/2019  . Chronic systolic CHF (congestive heart failure) ([place]) 04/12/2016  . Dyspnea   . Dysrhythmia Atrial flutter  . 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 Hypertension     Past Surgical History:  Procedure Laterality Date  . CARDIOVERSION N/A 09/04/2015   Procedure: CARDIOVERSION;  Surgeon: Skeet Latch, MD;  Location: [place];  Service: Cardiovascular;  Laterality: N/A;  .  IR THORACENTESIS ASP PLEURAL SPACE W/IMG GUIDE  06/02/2018  . TEE WITHOUT CARDIOVERSION N/A 09/04/2015   Procedure: TRANSESOPHAGEAL ECHOCARDIOGRAM (TEE);  Surgeon: Skeet Latch, MD;  Location: [place];  Service: Cardiovascular;  Laterality: N/A;  . TEE WITHOUT CARDIOVERSION N/A 04/15/2016   Procedure: TRANSESOPHAGEAL ECHOCARDIOGRAM (TEE);  Surgeon: Jerline Pain, MD;  Location: [facility]  ENDOSCOPY;  Service: Cardiovascular;  Laterality: N/A;  . TOE AMPUTATION Right    4th digit    Social History   Socioeconomic History  . Marital status: Married    Spouse name: Not on file  . Number of children: 2  . Years of education: Not on file  . Highest education level: Not on file  Occupational History  . Not on file  Tobacco Use  . Smoking status: Former Smoker    Packs/day: 0.12    Types: Cigarettes    Quit date: 1990    Years since quitting: 30.9  . Smokeless tobacco: Never Used  . Tobacco comment: 06/01/2018 "smoked off and on for 30 years; don't know how many years I actually smoked"  Substance and Sexual Activity  . Alcohol use: Not Currently    Comment: \  . Drug use: Never  . Sexual activity: Not on file  Other Topics Concern  . Not on file  Social History Narrative  . Not on file   Social Determinants of Health   Financial Resource Strain:   . Difficulty of Paying Living Expenses: Not on file  Food Insecurity:   . Worried About Charity fundraiser in the Last Year: Not on file  . Ran Out of Food in the Last Year: Not on file  Transportation Needs:   . Lack of Transportation (Medical): Not on file  . Lack of Transportation (Non-Medical): Not on file  Physical Activity:   . Days of Exercise per Week: Not on file  . Minutes of Exercise per Session: Not on file  Stress:   . Feeling of Stress : Not on file  Social Connections:   . Frequency of Communication with Friends and Family: Not on file  . Frequency of Social Gatherings with Friends and Family: Not on file  . Attends Religious Services: Not on file  . Active Member of Clubs or Organizations: Not on file  . Attends Archivist Meetings: Not on file  . Marital Status: Not on file  Intimate Partner Violence:   . Fear of Current or Ex-Partner: Not on file  . Emotionally Abused: Not on file  . Physically Abused: Not on file  . Sexually Abused: Not on file    Family History  Problem  Relation Age of Onset  . Heart disease Other        No family history    ROS: no fevers or chills, productive cough, hemoptysis, dysphasia, odynophagia, melena, hematochezia, dysuria, hematuria, rash, seizure activity, orthopnea, PND, pedal edema, claudication. Remaining systems are negative.  Physical Exam: Well-developed well-nourished in no acute distress.  Skin is warm and dry.  HEENT is normal.  Neck is supple.  Chest is clear to auscultation with normal expansion.  Cardiovascular exam is irregular Abdominal exam nontender or distended. No masses palpated. Extremities show 2+ edema. neuro grossly intact  ECG-sinus rhythm with first-degree AV block, right axis deviation, low voltage, cannot rule out septal infarct.  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 Continue torsemide at present dose. He will take  metolazone 2.5 mg daily for weight gain of 2 to 3 lbs. Continue fluid restriction and low-sodium diet. Check potassium and renal function.  2 amyloidosis-continue tafamidis.  He is scheduled to see a physician at [facility] concerning study drug.  3 paroxysmal atrial fibrillation-patient remains in sinus rhythm. Continue Tikosyn at present dose. May need to consider changing to amiodarone in the future if renal function deteriorates further. Continue apixaban. We will continue carvedilol for now. Note he does have a first-degree AV block on ECG and with amyloid we need to be concerned about conduction abnormalities. May need to discontinue in the future.  4 cardiomyopathy-continue carvedilol for now. We have not added ARB or Entresto due to borderline blood pressure.  5 coronary artery disease-based on prior CT showing coronary calcification-No aspirin given need for anticoagulation.  6 chronic stage III kidney disease-check BMET.  Kirk Ruths, MD

## 2019-05-19 ENCOUNTER — Emergency Department (HOSPITAL_BASED_OUTPATIENT_CLINIC_OR_DEPARTMENT_OTHER)
Admission: EM | Admit: 2019-05-19 | Discharge: 2019-05-19 | Disposition: A | Discharge status: Home / Self Care | Payer: No Typology Code available for payment source | Attending: Emergency Medicine | Admitting: Emergency Medicine

## 2019-05-19 ENCOUNTER — Emergency Department (HOSPITAL_BASED_OUTPATIENT_CLINIC_OR_DEPARTMENT_OTHER): Payer: No Typology Code available for payment source

## 2019-05-19 ENCOUNTER — Encounter (HOSPITAL_BASED_OUTPATIENT_CLINIC_OR_DEPARTMENT_OTHER): Payer: Self-pay | Admitting: *Deleted

## 2019-05-19 ENCOUNTER — Other Ambulatory Visit: Payer: Self-pay

## 2019-05-19 DIAGNOSIS — Z79899 Other long term (current) drug therapy: Secondary | ICD-10-CM | POA: Insufficient documentation

## 2019-05-19 DIAGNOSIS — R609 Edema, unspecified: Secondary | ICD-10-CM | POA: Insufficient documentation

## 2019-05-19 DIAGNOSIS — Z87891 Personal history of nicotine dependence: Secondary | ICD-10-CM | POA: Insufficient documentation

## 2019-05-19 DIAGNOSIS — I482 Chronic atrial fibrillation, unspecified: Secondary | ICD-10-CM | POA: Insufficient documentation

## 2019-05-19 DIAGNOSIS — I2510 Atherosclerotic heart disease of native coronary artery without angina pectoris: Secondary | ICD-10-CM | POA: Insufficient documentation

## 2019-05-19 DIAGNOSIS — M79605 Pain in left leg: Secondary | ICD-10-CM | POA: Insufficient documentation

## 2019-05-19 DIAGNOSIS — Z7901 Long term (current) use of anticoagulants: Secondary | ICD-10-CM | POA: Insufficient documentation

## 2019-05-19 DIAGNOSIS — I5043 Acute on chronic combined systolic (congestive) and diastolic (congestive) heart failure: Secondary | ICD-10-CM | POA: Insufficient documentation

## 2019-05-19 DIAGNOSIS — Z20828 Contact with and (suspected) exposure to other viral communicable diseases: Secondary | ICD-10-CM | POA: Insufficient documentation

## 2019-05-19 DIAGNOSIS — I110 Hypertensive heart disease with heart failure: Secondary | ICD-10-CM | POA: Insufficient documentation

## 2019-05-19 DIAGNOSIS — I509 Heart failure, unspecified: Secondary | ICD-10-CM

## 2019-05-19 DIAGNOSIS — M79604 Pain in right leg: Secondary | ICD-10-CM

## 2019-05-19 LAB — CBC WITH DIFFERENTIAL/PLATELET
Abs Immature Granulocytes: 0.04 10*3/uL (ref 0.00–0.07)
Basophils Absolute: 0 10*3/uL (ref 0.0–0.1)
Basophils Relative: 0 %
Eosinophils Absolute: 0.1 10*3/uL (ref 0.0–0.5)
Eosinophils Relative: 1 %
HCT: 47.4 % (ref 39.0–52.0)
Hemoglobin: 14.9 g/dL (ref 13.0–17.0)
Immature Granulocytes: 0 %
Lymphocytes Relative: 15 %
Lymphs Abs: 1.6 10*3/uL (ref 0.7–4.0)
MCH: 28 pg (ref 26.0–34.0)
MCHC: 31.4 g/dL (ref 30.0–36.0)
MCV: 89.1 fL (ref 80.0–100.0)
Monocytes Absolute: 1.4 10*3/uL — ABNORMAL HIGH (ref 0.1–1.0)
Monocytes Relative: 12 %
Neutro Abs: 8.1 10*3/uL — ABNORMAL HIGH (ref 1.7–7.7)
Neutrophils Relative %: 72 %
Platelets: 210 10*3/uL (ref 150–400)
RBC: 5.32 MIL/uL (ref 4.22–5.81)
RDW: 14.5 % (ref 11.5–15.5)
WBC: 11.2 10*3/uL — ABNORMAL HIGH (ref 4.0–10.5)
nRBC: 0 % (ref 0.0–0.2)

## 2019-05-19 LAB — SARS CORONAVIRUS 2 AG (30 MIN TAT): SARS Coronavirus 2 Ag: NEGATIVE

## 2019-05-19 LAB — BASIC METABOLIC PANEL
Anion gap: 12 (ref 5–15)
BUN: 25 mg/dL — ABNORMAL HIGH (ref 8–23)
CO2: 28 mmol/L (ref 22–32)
Calcium: 9 mg/dL (ref 8.9–10.3)
Chloride: 97 mmol/L — ABNORMAL LOW (ref 98–111)
Creatinine, Ser: 1.58 mg/dL — ABNORMAL HIGH (ref 0.61–1.24)
GFR calc Af Amer: 48 mL/min — ABNORMAL LOW (ref >60)
GFR calc non Af Amer: 41 mL/min — ABNORMAL LOW (ref >60)
Glucose, Bld: 100 mg/dL — ABNORMAL HIGH (ref 70–99)
Potassium: 3.5 mmol/L (ref 3.5–5.1)
Sodium: 137 mmol/L (ref 135–145)

## 2019-05-19 LAB — BRAIN NATRIURETIC PEPTIDE: B Natriuretic Peptide: 764.8 pg/mL — ABNORMAL HIGH (ref 0.0–100.0)

## 2019-05-19 LAB — RESPIRATORY PANEL BY RT PCR (FLU A&B, COVID)
Influenza A by PCR: NEGATIVE
Influenza B by PCR: NEGATIVE
SARS Coronavirus 2 by RT PCR: NEGATIVE

## 2019-05-19 MED ORDER — HYDROCODONE-ACETAMINOPHEN 5-325 MG PO TABS: 1.0000 | ORAL_TABLET | ORAL | 0 refills | Status: DC | PRN

## 2019-05-19 MED ORDER — FUROSEMIDE 10 MG/ML IJ SOLN
40.0000 mg | Freq: Once | INTRAMUSCULAR | Status: AC
  Administered 2019-05-19: 40 mg via INTRAVENOUS
  Filled 2019-05-19: qty 4

## 2019-05-19 MED ORDER — HYDROCODONE-ACETAMINOPHEN 5-325 MG PO TABS
1.0000 | ORAL_TABLET | Freq: Once | ORAL | Status: AC
  Administered 2019-05-19: 19:00:00 1 via ORAL
  Filled 2019-05-19: qty 1

## 2019-05-19 NOTE — ED Triage Notes (Addendum)
Pt c/o SOB x 1 day  HX CHF, pt answered all questions appropriately

## 2019-05-19 NOTE — ED Notes (Signed)
Wife given update. MD to call

## 2019-05-19 NOTE — ED Provider Notes (Signed)
[facility] EMERGENCY DEPARTMENT Provider Note   CSN: [id number] Arrival date & time: 05/19/19  1633     History Chief Complaint  Patient presents with  . Shortness of Breath    Christopher Benton is a 78 y.o. male who is a past medical history of CAD, cardiomyopathy, peripheral edema, chronic CHF, chronic atrial fibrillation on anticoagulation with Eliquis, hypertension, CKD stage III. Patient presents with progressively worsening shortness of breath at rest over the past several days.  He has been taking his diuretic medication as directed and feels like he has had good urinary output.  Patient has chronic peripheral edema and he does not notice any worsening however he does complain of severe pain in his left lower extremity.  He states that the pain has been there since he had a bout of cellulitis several weeks ago which was cleared up with antibiotics.  He denies any new redness however he does state it is more swollen than normal.  He denies hemoptysis, fever.  He does have a productive cough but states that that is normal with his CHF.  HPI     Past Medical History:  Diagnosis Date  . Arthritis    "left foot" (06/01/2018)  . Atrial fibrillation ([place])   . CAD (coronary artery disease)   . Cardiomyopathy ([place])   . Cellulitis and abscess of left leg 02/08/2019  . Chronic systolic CHF (congestive heart failure) ([place]) 04/12/2016  . Dyspnea   . Dysrhythmia Atrial flutter  . 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 Hypertension     Patient Active Problem List   Diagnosis Date Noted  . Cellulitis 02/08/2019  . Acute on chronic combined systolic and diastolic CHF (congestive heart failure) ([place]) 02/08/2019  . CKD (chronic kidney disease), stage III 02/08/2019  . 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 Pulmonary edema 05/31/2018  . Acute on chronic diastolic CHF (congestive heart failure) ([place]) 05/31/2018  . Acute on chronic systolic CHF  (congestive heart failure) ([place])   . Shortness of breath 03/08/2018  . AKI (acute kidney injury) ([place])   . Elevated troponin   . Pleural effusion on right 04/12/2016  . Acute on chronic systolic congestive heart failure ([place]) 04/12/2016  . CAD (coronary artery disease) 11/27/2015  . PAF (paroxysmal atrial fibrillation) ([place]) 09/25/2015  . Cardiomyopathy ([place]) 09/25/2015  . Typical atrial flutter ([place])   . Atypical atrial flutter ([place]) 08/28/2015  . Essential hypertension 08/28/2015    Past Surgical History:  Procedure Laterality Date  . CARDIOVERSION N/A 09/04/2015   Procedure: CARDIOVERSION;  Surgeon: Skeet Latch, MD;  Location: [facility];  Service: Cardiovascular;  Laterality: N/A;  . IR THORACENTESIS ASP PLEURAL SPACE W/IMG GUIDE  06/02/2018  . TEE WITHOUT CARDIOVERSION N/A 09/04/2015   Procedure: TRANSESOPHAGEAL ECHOCARDIOGRAM (TEE);  Surgeon: Skeet Latch, MD;  Location: [facility];  Service: Cardiovascular;  Laterality: N/A;  . TEE WITHOUT CARDIOVERSION N/A 04/15/2016   Procedure: TRANSESOPHAGEAL ECHOCARDIOGRAM (TEE);  Surgeon: Jerline Pain, MD;  Location: [facility] ENDOSCOPY;  Service: Cardiovascular;  Laterality: N/A;  . 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 Heart disease Other        No family history    Social History   Tobacco Use  . Smoking status: Former Smoker    Packs/day: 0.12    Types: Cigarettes    Quit date: 1990  Years since quitting: 30.9  . Smokeless tobacco: Never Used  . Tobacco comment: 06/01/2018 "smoked off and on for 30 years; don't know how many years I actually smoked"  Substance Use Topics  . Alcohol use: Not Currently    Comment: \  . Drug use: Never    Home Medications Prior to Admission medications   Medication Sig Start Date End Date Taking? Authorizing Provider  apixaban (ELIQUIS) 5 MG TABS tablet Take 1 tablet (5 mg total) by mouth 2 (two) times daily. 08/28/15   Lelon Perla, MD  carvedilol (COREG) 6.25 MG tablet Take 3.125 mg by mouth 2 (two) times daily with a meal.    [provider]  dofetilide (TIKOSYN) 125 MCG capsule Take 1 capsule (125 mcg total) by mouth 2 (two) times daily. 06/07/18   Shirley Friar, PA-C  dorzolamide (TRUSOPT) 2 % ophthalmic solution Place 1 drop into both eyes 2 (two) times daily.    [provider]  latanoprost (XALATAN) 0.005 % ophthalmic solution Place 1 drop into both eyes at bedtime.    [provider]  metolazone (ZAROXOLYN) 2.5 MG tablet Take 1 tablet (2.5 mg total) by mouth as directed. By HF clinic for swelling 04/13/19   Bensimhon, Shaune Pascal, MD  midodrine (PROAMATINE) 5 MG tablet Take 1 tablet (5 mg total) by mouth 3 (three) times daily with meals. Patient not taking: Reported on 05/12/2019 04/06/19   Bensimhon, Shaune Pascal, MD  Multiple Vitamins-Minerals (ONE-A-DAY MENS 50+ ADVANTAGE) TABS Take 1 tablet by mouth daily with breakfast.    [provider]  Polyvinyl Alcohol (LIQUID TEARS OP) Place 1-2 drops into both eyes as needed (for dry eyes).    [provider]  potassium chloride SA (K-DUR) 20 MEQ tablet Take 2 tablets (40 mEq total) by mouth every morning AND 1 tablet (20 mEq total) every evening. Patient taking differently: Take 20 mEq by mouth in the morning and 20 mEq at bedtime 01/30/19   Bensimhon, Shaune Pascal, MD  Tafamidis 61 MG CAPS Take 61 mg by mouth daily after breakfast.     [provider]  torsemide (DEMADEX) 20 MG tablet Take 3 tablets (60 mg total) by mouth every morning AND 2 tablets (40 mg total) every evening. 04/06/19   Bensimhon, Shaune Pascal, MD    Allergies    Pradaxa [dabigatran etexilate mesylate] and Penicillins  Review of Systems   Review of Systems Ten systems reviewed and are negative for acute change, except as noted in the HPI.   Physical Exam Updated Vital Signs BP 103/77   Pulse 79   Temp 98.7 F (37.1 C) (Oral)   Resp 18   Ht  5\' 11"  (1.803 m)   Wt 104 kg   SpO2 94%   BMI 31.98 kg/m   Physical Exam Vitals and nursing note reviewed.  Constitutional:      General: He is not in acute distress.    Appearance: He is well-developed. He is not diaphoretic.  HENT:     Head: Normocephalic and atraumatic.  Eyes:     General: No scleral icterus.    Conjunctiva/sclera: Conjunctivae normal.  Cardiovascular:     Rate and Rhythm: Normal rate and regular rhythm.     Pulses:          Dorsalis pedis pulses are 2+ on the right side and 2+ on the left side.     Heart sounds: Normal heart sounds.  Pulmonary:     Effort: Pulmonary  effort is normal. No respiratory distress.     Breath sounds: Examination of the right-middle field reveals decreased breath sounds. Examination of the left-middle field reveals decreased breath sounds. Examination of the right-lower field reveals decreased breath sounds. Examination of the left-lower field reveals decreased breath sounds. Decreased breath sounds present. No wheezing, rhonchi or rales.  Abdominal:     Palpations: Abdomen is soft.     Tenderness: There is no abdominal tenderness.  Musculoskeletal:     Cervical back: Normal range of motion and neck supple.     Right lower leg: Edema present.     Left lower leg: Edema present.     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  Neurological:     Mental Status: He is alert.  Psychiatric:        Behavior: Behavior normal.     ED Results / Procedures / Treatments   Labs (all labs ordered are listed, but only abnormal results are displayed) Labs Reviewed  CBC WITH DIFFERENTIAL/PLATELET - Abnormal; Notable for the following components:      Result Value   WBC 11.2 (*)    Neutro Abs 8.1 (*)    Monocytes Absolute 1.4 (*)    All other components within normal limits  BRAIN NATRIURETIC PEPTIDE - Abnormal; Notable for the following  components:   B Natriuretic Peptide 764.8 (*)    All other components within normal limits  SARS CORONAVIRUS 2 AG (30 MIN TAT)  BASIC METABOLIC PANEL    EKG EKG Interpretation  Date/Time:  Friday May 19 2019 16:51:39 EST Ventricular Rate:  79 PR Interval:    QRS Duration: 98 QT Interval:  429 QTC Calculation: 492 R Axis:   -98 Text Interpretation: Atrial fibrillation Left anterior fascicular block Anterior infarct, old Confirmed by Isla Pence [phone]) on 05/19/2019 5:11:04 PM   Radiology No results found.  Procedures Procedures (including critical care time)  Medications Ordered in ED Medications  furosemide (LASIX) injection 40 mg (40 mg Intravenous Given 05/19/19 1737)    ED Course  I have reviewed the triage vital signs and the nursing notes.  Pertinent labs & imaging results that were available during my care of the patient were reviewed by me and considered in my medical decision making (see chart for details).  Clinical Course as of May 18 1740  Fri May 19, 2019  1734 DVT study cancelled- No [name] available at this time.   [AH]    Clinical Course User Index [AH] Margarita Mail, PA-C   MDM Rules/Calculators/A&P                      7/8 y/ [age]  Who presents with cc of SOB The emergent differential diagnosis for shortness of breath includes, but is not limited to, Pulmonary edema, bronchoconstriction, Pneumonia, Pulmonary embolism, Pneumotherax/ Hemothorax, Dysrythmia, ACS.  Patient has elevated WBC on cbc. No anemia. EKG shows rate controlled Afib at a rate of 79. He has no cp,. No no evidence of cellulitis or infection of the left lower extremity. I have given signout to Dr. Gilford Raid who will assume care of the patient  Final Clinical Impression(s) / ED Diagnoses Final diagnoses:  None    Rx / DC Orders ED Discharge Orders    None       Margarita Mail, PA-C 05/19/19 1748  Isla Pence, MD 05/19/19 [phone]

## 2019-05-19 NOTE — ED Notes (Signed)
Pt's wife given update about pt's status.

## 2019-05-19 NOTE — ED Notes (Signed)
ED Provider at bedside. 

## 2019-05-19 NOTE — ED Notes (Signed)
Wife demanding to come back with pt, instructed wife on [facility] policy , instructed her to wait in her car that we have her phone number and will call her with updates, wife continues to demand to be taking back to husband, [facility] vistor policy handed to wife.

## 2019-05-23 ENCOUNTER — Telehealth (HOSPITAL_COMMUNITY): Payer: Self-pay | Admitting: Cardiology

## 2019-05-23 NOTE — Telephone Encounter (Signed)
Attempted to call pt's wife to f/u and Left message to call back

## 2019-05-23 NOTE — Telephone Encounter (Signed)
Patient seen in the ER 05/19/19 for pain in lower extremities Given dose of lasix, discharged with Vicodin for pain management and told to follow up in 2 days with CHF clinic  Advised he is already scheduled for follow up on 06/06/18, swelling better after lasix dose, otherwise asymptomatic.advised may need to follow with PCP  For pain management however patients wife was told to specifically have this managed with CHF clinic. will forward to review for work in appt as scheduling is limited at this time.  Also will need to review with provider about local neurologist, wife reports after visit with Dr Lora Paula a referral was needed.

## 2019-05-24 NOTE — Telephone Encounter (Signed)
LATE ENTRY from 8:15 AM this morning:  Spoke w/pt's wife, she takes pt went to ER Fri for LE edema, SOB and L knee/leg pain.  She states pt was given IV Lasix and sent home w/rx for Vicodin for the knee pain.  She states since then the edema is slightly better but still having horrible pain in L knee, pt got on the phone and states it hurts from knee down to his foot, he states it hurts worse to move it, he is able to stand but states that hurts as well.  He states SOB is better.  Discussed w/Dr Bensimhon, he states pt should take Metolazone for 2 days, if leg not feeling better needs to f/u w/PCP or Ortho as this has been an ongoing issue.  Attempted to call pt & wife back and Left message to call back

## 2019-05-24 NOTE — Telephone Encounter (Addendum)
Per Dr Haroldine Laws give pt a dose or 2 of Metolazone, if knee continues to hurt will need to see pcp or ortho.  Unable to reach pt/wife, left message to take Metolazone per Dr Haroldine Laws, call us back.

## 2019-05-24 NOTE — Telephone Encounter (Signed)
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

## 2019-05-25 ENCOUNTER — Telehealth (HOSPITAL_COMMUNITY): Payer: Self-pay

## 2019-05-25 ENCOUNTER — Encounter: Payer: Self-pay | Admitting: Neurology

## 2019-05-25 DIAGNOSIS — G629 Polyneuropathy, unspecified: Secondary | ICD-10-CM

## 2019-05-25 MED ORDER — TRAMADOL HCL 50 MG PO TABS: 50.0000 mg | ORAL_TABLET | Freq: Two times a day (BID) | ORAL | 0 refills | Status: DC | PRN

## 2019-05-25 NOTE — Telephone Encounter (Signed)
If he has additional fluid and weight is going up and not responding to oral diuretics needs to be admitted for IV lasix.

## 2019-05-25 NOTE — Telephone Encounter (Signed)
Wife called again stating that patient continues with leg pain.  He has been taking pain meds as prescribed and he took Metolazone x2 doses with no improvement of swelling and/or pain.  She says they called PCP and she deferred him back to our office stating that his pain/swelling was related to heart failure. She is worried herself that it is related to the amyloid.  She did note that his swelling in pain has been present since having cellulitis in Sept.  I advised I would forward to MD to review.

## 2019-05-25 NOTE — Telephone Encounter (Signed)
Spoke with patients wife, she reports patients weight is stable and not going up.  He is wearing support hose.  Patient has appt with Dr Haroldine Laws on 06/07/19.  Per MD, can get tramadol 50mg  BID prn until seen in office.  She was appreciative. RX faxed to [facility] in [place].

## 2019-06-07 ENCOUNTER — Ambulatory Visit (HOSPITAL_COMMUNITY): Payer: No Typology Code available for payment source

## 2019-06-07 ENCOUNTER — Telehealth (HOSPITAL_COMMUNITY): Payer: Self-pay

## 2019-06-07 ENCOUNTER — Encounter (HOSPITAL_COMMUNITY): Payer: No Typology Code available for payment source | Admitting: Internal Medicine

## 2019-06-07 NOTE — Telephone Encounter (Signed)
Received a phone call from Christopher Benton from the [facility] in [place]. She reports that Christopher Benton for our office expired in November 2020. She reports that they will not cover his echo nor his OV that is scheduled for today 1/6 unless we can fax her a "R-Fas" form and his last ov note today before his appointment.   Please contact her at your earliest convenience.   Office number: [phone] ext: [phone]

## 2019-06-26 ENCOUNTER — Ambulatory Visit: Payer: No Typology Code available for payment source | Admitting: Neurology

## 2019-06-26 NOTE — Progress Notes (Deleted)
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 Had stress test 8/17 at [facility] that showed EF 41 and no ischemia. Pt admitted 11/17 with dyspnea and right pleural effusion. Had thoracentesis and cytology negative; diuresed. TEE 11/17 showed EF 35-40, biatrial enlargement and moderate TR. Echocardiogram October 2019 showed ejection fraction 45 to 50%; severe left ventricular hypertrophy and amyloid should be considered; moderate diastolic dysfunction; mildly dilated ascending aorta, biatrial enlargement and mild tricuspid regurgitation. CTA October 2019 showed large right pleural effusion. 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 Patient admitted September 2020 with lower extremity cellulitis and volume excess. Patient was treated with antibiotics and diuretics. Since last seen,  Current Outpatient Medications  Medication Sig Dispense Refill  . apixaban (ELIQUIS) 5 MG TABS tablet Take 1 tablet (5 mg total) by mouth 2 (two) times daily. 60 tablet 6  . carvedilol (COREG) 6.25 MG tablet Take 3.125 mg by mouth 2 (two) times daily with a meal.    . dofetilide (TIKOSYN) 125 MCG capsule Take 1 capsule (125 mcg total) by mouth 2 (two) times daily. 60 capsule 6  . dorzolamide (TRUSOPT) 2 % ophthalmic solution Place 1 drop into both eyes 2 (two) times daily.    Marland Kitchen HYDROcodone-acetaminophen (NORCO/VICODIN) 5-325 MG tablet Take 1 tablet by mouth every 4 (four) hours as needed. 10 tablet 0  . latanoprost (XALATAN) 0.005 % ophthalmic solution Place 1 drop into both eyes at bedtime.    . metolazone (ZAROXOLYN) 2.5 MG tablet Take 1 tablet (2.5 mg total) by mouth as directed. By HF clinic for swelling 10 tablet 3  . midodrine (PROAMATINE) 5 MG tablet Take 1 tablet (5 mg  total) by mouth 3 (three) times daily with meals. (Patient not taking: Reported on 05/12/2019) 90 tablet 6  . Multiple Vitamins-Minerals (ONE-A-DAY MENS 50+ ADVANTAGE) TABS Take 1 tablet by mouth daily with breakfast.    . Polyvinyl Alcohol (LIQUID TEARS OP) Place 1-2 drops into both eyes as needed (for dry eyes).    . potassium chloride SA (K-DUR) 20 MEQ tablet Take 2 tablets (40 mEq total) by mouth every morning AND 1 tablet (20 mEq total) every evening. (Patient taking differently: Take 20 mEq by mouth in the morning and 20 mEq at bedtime) 270 tablet 3  . Tafamidis 61 MG CAPS Take 61 mg by mouth daily after breakfast.     . torsemide (DEMADEX) 20 MG tablet Take 3 tablets (60 mg total) by mouth every morning AND 2 tablets (40 mg total) every evening. 360 tablet 3  . traMADol (ULTRAM) 50 MG tablet Take 1 tablet (50 mg total) by mouth 2 (two) times daily as needed for severe pain. 20 tablet 0   No current facility-administered medications for this visit.     Past Medical History:  Diagnosis Date  . Arthritis    "left foot" (06/01/2018)  . Atrial fibrillation ([place])   . CAD (coronary artery disease)   . Cardiomyopathy ([place])   . Cellulitis and abscess of left leg 02/08/2019  . Chronic systolic CHF (congestive heart failure) ([place]) 04/12/2016  . Dyspnea   . Dysrhythmia Atrial flutter  . 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 Hypertension     Past Surgical History:  Procedure Laterality Date  . CARDIOVERSION N/A 09/04/2015   Procedure:  CARDIOVERSION;  Surgeon: Skeet Latch, MD;  Location: [facility];  Service: Cardiovascular;  Laterality: N/A;  . IR THORACENTESIS ASP PLEURAL SPACE W/IMG GUIDE  06/02/2018  . TEE WITHOUT CARDIOVERSION N/A 09/04/2015   Procedure: TRANSESOPHAGEAL ECHOCARDIOGRAM (TEE);  Surgeon: Skeet Latch, MD;  Location: [facility];  Service: Cardiovascular;  Laterality: N/A;  . TEE WITHOUT CARDIOVERSION N/A 04/15/2016    Procedure: TRANSESOPHAGEAL ECHOCARDIOGRAM (TEE);  Surgeon: Jerline Pain, MD;  Location: [facility] ENDOSCOPY;  Service: Cardiovascular;  Laterality: N/A;  . TOE AMPUTATION Right    4th digit    Social History   Socioeconomic History  . Marital status: Married    Spouse name: Not on file  . Number of children: 2  . Years of education: Not on file  . Highest education level: Not on file  Occupational History  . Not on file  Tobacco Use  . Smoking status: Former Smoker    Packs/day: 0.12    Types: Cigarettes    Quit date: 1990    Years since quitting: 31.0  . Smokeless tobacco: Never Used  . Tobacco comment: 06/01/2018 "smoked off and on for 30 years; don't know how many years I actually smoked"  Substance and Sexual Activity  . Alcohol use: Not Currently    Comment: \  . Drug use: Never  . Sexual activity: Not on file  Other Topics Concern  . Not on file  Social History Narrative  . Not on file   Social Determinants of Health   Financial Resource Strain:   . Difficulty of Paying Living Expenses: Not on file  Food Insecurity:   . Worried About Charity fundraiser in the Last Year: Not on file  . Ran Out of Food in the Last Year: Not on file  Transportation Needs:   . Lack of Transportation (Medical): Not on file  . Lack of Transportation (Non-Medical): Not on file  Physical Activity:   . Days of Exercise per Week: Not on file  . Minutes of Exercise per Session: Not on file  Stress:   . Feeling of Stress : Not on file  Social Connections:   . Frequency of Communication with Friends and Family: Not on file  . Frequency of Social Gatherings with Friends and Family: Not on file  . Attends Religious Services: Not on file  . Active Member of Clubs or Organizations: Not on file  . Attends Archivist Meetings: Not on file  . Marital Status: Not on file  Intimate Partner Violence:   . Fear of Current or Ex-Partner: Not on file  . Emotionally Abused: Not on file  .  Physically Abused: Not on file  . Sexually Abused: Not on file    Family History  Problem Relation Age of Onset  . Heart disease Other        No family history    ROS: no fevers or chills, productive cough, hemoptysis, dysphasia, odynophagia, melena, hematochezia, dysuria, hematuria, rash, seizure activity, orthopnea, PND, pedal edema, claudication. Remaining systems are negative.  Physical Exam: Well-developed well-nourished in no acute distress.  Skin is warm and dry.  HEENT is normal.  Neck is supple.  Chest is clear to auscultation with normal expansion.  Cardiovascular exam is regular rate and rhythm.  Abdominal exam nontender or distended. No masses palpated. Extremities show no edema. neuro grossly intact  ECG- personally reviewed  A/P  1 chronic combined systolic/diastolic congestive heart failure-volume status relatively stable since last visit.  Continue Demadex at  present dose.  He will take an additional 2.5 mg of metolazone daily as needed for weight gain of 2 to 3 pounds.  Check potassium and renal function.  2 amyloidosis-continue tafamidis.  He is [facility].  3 paroxysmal atrial fibrillation-patient remains in sinus rhythm.  Continue low-dose Tikosyn.  Given renal insufficiency we may need to consider changing to amiodarone in the future.  Continue carvedilol and apixaban.  4 cardiomyopathy-we will continue carvedilol at present dose.  His blood pressure has been borderline and we have not added an ARB or Entresto in the past.  5 chronic stage III kidney disease-check potassium and renal function.  6 coronary artery disease-diagnosis is based on prior CT showing coronary calcification.  He is not on aspirin given need for apixaban.  Kirk Ruths, MD

## 2019-06-28 ENCOUNTER — Ambulatory Visit: Payer: No Typology Code available for payment source | Admitting: Cardiology

## 2019-06-29 NOTE — Progress Notes (Signed)
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 Had stress test 8/17 at [facility] that showed EF 41 and no ischemia. Pt admitted 11/17 with dyspnea and right pleural effusion. Had thoracentesis and cytology negative; diuresed. TEE 11/17 showed EF 35-40, biatrial enlargement and moderate TR. Echocardiogram October 2019 showed ejection fraction 45 to 50%; severe left ventricular hypertrophy and amyloid should be considered; moderate diastolic dysfunction; mildly dilated ascending aorta, biatrial enlargement and mild tricuspid regurgitation. CTA October 2019 showed large right pleural effusion. 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 Since last seen,patient has dyspnea on exertion unchanged.  He has bilateral lower extremity edema.  No chest pain or syncope.  Current Outpatient Medications  Medication Sig Dispense Refill  . AMBULATORY NON FORMULARY MEDICATION Shower chair 1 each 0  . apixaban (ELIQUIS) 5 MG TABS tablet Take 1 tablet (5 mg total) by mouth 2 (two) times daily. 60 tablet 6  . carvedilol (COREG) 6.25 MG tablet Take 3.125 mg by mouth 2 (two) times daily with a meal.    . dofetilide (TIKOSYN) 125 MCG capsule Take 1 capsule (125 mcg total) by mouth 2 (two) times daily. 60 capsule 6  . dorzolamide (TRUSOPT) 2 % ophthalmic solution Place 1 drop into both eyes 2 (two) times daily.    Marland Kitchen HYDROcodone-acetaminophen (NORCO/VICODIN) 5-325 MG tablet Take 1 tablet by mouth every 4 (four) hours as needed. 10 tablet 0  . latanoprost (XALATAN) 0.005 % ophthalmic solution Place 1 drop into both eyes at bedtime.    . metolazone (ZAROXOLYN) 2.5 MG tablet Take 1 tablet (2.5 mg total) by mouth as directed. By HF clinic for swelling 10 tablet 3  . midodrine (PROAMATINE) 5  MG tablet Take 1 tablet (5 mg total) by mouth 3 (three) times daily with meals. 90 tablet 6  . Multiple Vitamins-Minerals (ONE-A-DAY MENS 50+ ADVANTAGE) TABS Take 1 tablet by mouth daily with breakfast.    . Polyvinyl Alcohol (LIQUID TEARS OP) Place 1-2 drops into both eyes as needed (for dry eyes).    . potassium chloride SA (K-DUR) 20 MEQ tablet Take 2 tablets (40 mEq total) by mouth every morning AND 1 tablet (20 mEq total) every evening. (Patient taking differently: Take 20 mEq by mouth in the morning and 20 mEq at bedtime) 270 tablet 3  . Tafamidis 61 MG CAPS Take 61 mg by mouth daily after breakfast.     . torsemide (DEMADEX) 20 MG tablet Take 3 tablets (60 mg total) by mouth every morning AND 2 tablets (40 mg total) every evening. 360 tablet 3  . traMADol (ULTRAM) 50 MG tablet Take 1 tablet (50 mg total) by mouth 2 (two) times daily as needed for severe pain. 20 tablet 0   No current facility-administered medications for this visit.     Past Medical History:  Diagnosis Date  . Arthritis    "left foot" (06/01/2018)  . Atrial fibrillation ([place])   . CAD (coronary artery disease)   . Cardiomyopathy ([place])   . Cellulitis and abscess of left leg 02/08/2019  . Chronic systolic CHF (congestive heart failure) ([place]) 04/12/2016  . Dyspnea   . Dysrhythmia Atrial flutter  . 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 Hypertension     Past Surgical History:  Procedure Laterality Date  . CARDIOVERSION  N/A 09/04/2015   Procedure: CARDIOVERSION;  Surgeon: Skeet Latch, MD;  Location: [place];  Service: Cardiovascular;  Laterality: N/A;  . IR THORACENTESIS ASP PLEURAL SPACE W/IMG GUIDE  06/02/2018  . TEE WITHOUT CARDIOVERSION N/A 09/04/2015   Procedure: TRANSESOPHAGEAL ECHOCARDIOGRAM (TEE);  Surgeon: Skeet Latch, MD;  Location: [place];  Service: Cardiovascular;  Laterality: N/A;  . TEE WITHOUT CARDIOVERSION N/A 04/15/2016   Procedure:  TRANSESOPHAGEAL ECHOCARDIOGRAM (TEE);  Surgeon: Jerline Pain, MD;  Location: [facility] ENDOSCOPY;  Service: Cardiovascular;  Laterality: N/A;  . TOE AMPUTATION Right    4th digit    Social History   Socioeconomic History  . Marital status: Married    Spouse name: Not on file  . Number of children: 2  . Years of education: Not on file  . Highest education level: Not on file  Occupational History  . Not on file  Tobacco Use  . Smoking status: Former Smoker    Packs/day: 0.12    Types: Cigarettes    Quit date: 1990    Years since quitting: 31.1  . Smokeless tobacco: Never Used  . Tobacco comment: 06/01/2018 "smoked off and on for 30 years; don't know how many years I actually smoked"  Substance and Sexual Activity  . Alcohol use: Not Currently    Comment: \  . Drug use: Never  . Sexual activity: Not on file  Other Topics Concern  . 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     Social Determinants of Health   Financial Resource Strain:   . Difficulty of Paying Living Expenses: Not on file  Food Insecurity:   . Worried About Charity fundraiser in the Last Year: Not on file  . Ran Out of Food in the Last Year: Not on file  Transportation Needs:   . Lack of Transportation (Medical): Not on file  . Lack of Transportation (Non-Medical): Not on file  Physical Activity:   . Days of Exercise per Week: Not on file  . Minutes of Exercise per Session: Not on file  Stress:   . Feeling of Stress : Not on file  Social Connections:   . Frequency of Communication with Friends and Family: Not on file  . Frequency of Social Gatherings with Friends and Family: Not on file  . Attends Religious Services: Not on file  . Active Member of Clubs or Organizations: Not on file  . Attends Archivist Meetings: Not on file  . Marital Status: Not on file  Intimate Partner Violence:   . Fear of Current or Ex-Partner: Not on file  . Emotionally  Abused: Not on file  . Physically Abused: Not on file  . Sexually Abused: Not on file    Family History  Problem Relation Age of Onset  . Heart disease Other        No family history    ROS: Some knee pain from recent fall and fatigue but no fevers or chills, productive cough, hemoptysis, dysphasia, odynophagia, melena, hematochezia, dysuria, hematuria, rash, seizure activity, orthopnea, PND, pedal edema, claudication. Remaining systems are negative.  Physical Exam: Well-developed well-nourished in no acute distress.  Skin is warm and dry.  HEENT is normal.  Neck is supple.  Chest diminished breath sounds right lower lobe Cardiovascular exam is regular rate and rhythm.  Abdominal exam nontender or distended. No masses palpated. Extremities show 2+ edema. neuro grossly intact  ECG-sinus bradycardia with PACs, first-degree AV block, limb lead reversal.  Personally reviewed  A/P  1 chronic combined systolic/diastolic congestive heart failure-patient is volume overloaded on examination today.  He has bilateral lower extremity edema and recent CT showed ascites and bilateral pleural effusions right greater than left.  I will increase Demadex to 40 mg twice daily.  Continue fluid restriction and low-sodium diet.  Check potassium and renal function in 1 week.  He will take an additional 2.5 mg of metolazone daily as needed for weight gain of 2 to 3 pounds.    2 amyloidosis-continue tafamidis.   3 paroxysmal atrial fibrillation-patient remains in sinus rhythm.  Continue low-dose Tikosyn.  Given renal insufficiency we may need to consider changing to amiodarone in the future.  Continue carvedilol and apixaban.  4 cardiomyopathy-we will continue carvedilol at present dose.  His blood pressure has been borderline and we have not added an ARB or Entresto in the past.  5 chronic stage III kidney disease-check potassium and renal function.  6 coronary artery disease-diagnosis is based on  prior CT showing coronary calcification.  He is not on aspirin given need for apixaban.  Kirk Ruths, MD

## 2019-06-30 ENCOUNTER — Other Ambulatory Visit: Payer: Self-pay

## 2019-06-30 ENCOUNTER — Encounter: Payer: Self-pay | Admitting: Neurology

## 2019-06-30 ENCOUNTER — Telehealth (INDEPENDENT_AMBULATORY_CARE_PROVIDER_SITE_OTHER): Payer: No Typology Code available for payment source | Admitting: Neurology

## 2019-06-30 DIAGNOSIS — Z87891 Personal history of nicotine dependence: Secondary | ICD-10-CM

## 2019-06-30 DIAGNOSIS — G63 Polyneuropathy in diseases classified elsewhere: Secondary | ICD-10-CM

## 2019-06-30 DIAGNOSIS — E851 Neuropathic heredofamilial amyloidosis: Secondary | ICD-10-CM

## 2019-06-30 MED ORDER — AMBULATORY NON FORMULARY MEDICATION: 0 refills | Status: DC

## 2019-06-30 NOTE — Progress Notes (Signed)
New Patient Virtual Visit via Video Note The purpose of this virtual visit is to provide medical care while limiting exposure to the novel coronavirus.    Consent was obtained for video visit:  Yes.   Answered questions that patient had about telehealth interaction:  Yes.   I discussed the limitations, risks, security and privacy concerns of performing an evaluation and management service by telemedicine. I also discussed with the patient that there may be a patient responsible charge related to this service. The patient expressed understanding and agreed to proceed.  Pt location: Home Physician Location: office Name of referring provider:  Bensimhon, Shaune Pascal, MD I connected with Arlana Lindau at patients initiation/request on 06/30/2019 at 12:50 PM EST by video enabled telemedicine application and verified that I am speaking with the correct person using two identifiers. Pt MRN:  [id number] Pt DOB:  Oct 21, 1940 Video Participants:  Arlana Lindau;  Arville Lime (wife)    History of Present Illness: Christopher Benton is a 79 y.o. 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  He reports having at least 3 year history of numbness in the feet, which is worse in the left foot.  He also occasionally has some tingling.  Symptoms predominately involve the toes, soles, and tops of the feet, but can extend into the ankles and lower legs.  He endorses imbalance and uses crutches for support.  He is having physical therapy and occupational therapy through the [place].  He denies any numbness/tingling of the hands.  No personal or family history of carpal tunnel syndrome.  No personal history of diabetes or heavy alcohol use.   Out-side paper records, electronic medical record, and images  have been reviewed where available and summarized as:   Lab Results  Component Value Date   TSH 4.431 03/08/2018   Lab Results  Component Value Date   ESRSEDRATE 1 12/19/2018    Past Medical History:  Diagnosis Date  . Arthritis    "left foot" (06/01/2018)  . Atrial fibrillation ([place])   . CAD (coronary artery disease)   . Cardiomyopathy ([place])   . Cellulitis and abscess of left leg 02/08/2019  . Chronic systolic CHF (congestive heart failure) ([place]) 04/12/2016  . Dyspnea   . Dysrhythmia Atrial flutter  . 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 Hypertension     Past Surgical History:  Procedure Laterality Date  . CARDIOVERSION N/A 09/04/2015   Procedure: CARDIOVERSION;  Surgeon: Skeet Latch, MD;  Location: [place];  Service: Cardiovascular;  Laterality: N/A;  . IR THORACENTESIS ASP PLEURAL SPACE W/IMG GUIDE  06/02/2018  . TEE WITHOUT CARDIOVERSION N/A 09/04/2015   Procedure: TRANSESOPHAGEAL ECHOCARDIOGRAM (TEE);  Surgeon: Skeet Latch, MD;  Location: [place];  Service: Cardiovascular;  Laterality: N/A;  . TEE WITHOUT CARDIOVERSION N/A 04/15/2016   Procedure: TRANSESOPHAGEAL ECHOCARDIOGRAM (TEE);  Surgeon: Jerline Pain, MD;  Location: [facility] ENDOSCOPY;  Service: Cardiovascular;  Laterality: N/A;  . TOE AMPUTATION Right    4th digit     Medications:  Outpatient Encounter Medications as of 06/30/2019  Medication Sig Note  . apixaban (ELIQUIS) 5 MG TABS tablet Take 1 tablet (5 mg total) by mouth 2 (two) times daily.   . carvedilol (COREG) 6.25 MG tablet Take 3.125 mg by  mouth 2 (two) times daily with a meal. 02/08/2019: Confirmed by the [facility]- I called (patient splits a 6.25 mg tablet and takes 3.125 mg TWO times a day)  . dofetilide (TIKOSYN) 125 MCG capsule Take 1 capsule (125 mcg total) by mouth 2 (two) times daily.   . dorzolamide (TRUSOPT) 2 % ophthalmic solution Place 1 drop into both eyes 2 (two) times daily.   Marland Kitchen  HYDROcodone-acetaminophen (NORCO/VICODIN) 5-325 MG tablet Take 1 tablet by mouth every 4 (four) hours as needed.   . latanoprost (XALATAN) 0.005 % ophthalmic solution Place 1 drop into both eyes at bedtime.   . metolazone (ZAROXOLYN) 2.5 MG tablet Take 1 tablet (2.5 mg total) by mouth as directed. By HF clinic for swelling   . midodrine (PROAMATINE) 5 MG tablet Take 1 tablet (5 mg total) by mouth 3 (three) times daily with meals.   . Multiple Vitamins-Minerals (ONE-A-DAY MENS 50+ ADVANTAGE) TABS Take 1 tablet by mouth daily with breakfast.   . Polyvinyl Alcohol (LIQUID TEARS OP) Place 1-2 drops into both eyes as needed (for dry eyes).   . potassium chloride SA (K-DUR) 20 MEQ tablet Take 2 tablets (40 mEq total) by mouth every morning AND 1 tablet (20 mEq total) every evening. (Patient taking differently: Take 20 mEq by mouth in the morning and 20 mEq at bedtime)   . Tafamidis 61 MG CAPS Take 61 mg by mouth daily after breakfast.    . torsemide (DEMADEX) 20 MG tablet Take 3 tablets (60 mg total) by mouth every morning AND 2 tablets (40 mg total) every evening.   . traMADol (ULTRAM) 50 MG tablet Take 1 tablet (50 mg total) by mouth 2 (two) times daily as needed for severe pain.    No facility-administered encounter medications on file as of 06/30/2019.    Allergies:  Allergies  Allergen Reactions  . Pradaxa [Dabigatran Etexilate Mesylate] Nausea Only  . Penicillins Rash    Did it involve swelling of the face/tongue/throat, SOB, or low BP? No Did it involve sudden or severe rash/hives, skin peeling, or any reaction on the inside of your mouth or nose? No Did you need to seek medical attention at a hospital or doctor's office? Unk When did it last happen? "it happened many years ago" If all above answers are "NO", may proceed with cephalosporin use.     Family History: Family History  Problem Relation Age of Onset  . Heart disease Other        No family history    Social History: Social  History   Tobacco Use  . Smoking status: Former Smoker    Packs/day: 0.12    Types: Cigarettes    Quit date: 1990    Years since quitting: 31.0  . Smokeless tobacco: Never Used  . Tobacco comment: 06/01/2018 "smoked off and on for 30 years; don't know how many years I actually smoked"  Substance Use Topics  . Alcohol use: Not Currently    Comment: \  . Drug use: Never   Social History   Social History Narrative  . Not on file     Vital Signs:  There were no vitals taken for this visit.   General Medical Exam:  Well appearing, comfortable.  Nonlabored breathing. 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  Speech is not dysarthric.  CRANIAL NERVES:  Normal conjugate, extra-ocular eye movements in all directions of gaze.  No ptosis.  Normal facial symmetry and movements.  Normal shoulder shrug and head rotation.  Tongue is midline.  MOTOR:  Antigravity in all extremities.  No abnormal movements.  No pronator drift.   SENSORY: Unable to assess.   COORDINATION/GAIT: Normal finger to nose bilaterally.  Intact rapid alternating movements bilaterally.  Gait appears wide-based, stable and unassisted.  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  Patient will return to the office for NCS/EMG of the legs.  Symptoms are predominately numbness for which there are no effective medications.  Fortunately, he does not have severe pain.  I have encouraged him to continue PT balance exercises and use a gait assist device anytime he is on uneven ground.  Fall precautions discussed I also discussed importance of using a shower chair to minimize risk of falls. I will mail his a  prescription for this.  Finally, he has a known right foot ulcer which is being closely followed by podiatry, I again emphasized the importance of daily foot inspection.   Follow Up Instructions:  I discussed the assessment and treatment plan with the patient. The patient was provided an opportunity to ask questions and all were answered. The patient agreed with the plan and demonstrated an understanding of the instructions.   The patient was advised to call back or seek an in-person evaluation if the symptoms worsen or if the condition fails to improve as anticipated.    Alda Berthold, DO

## 2019-07-03 ENCOUNTER — Telehealth: Payer: Self-pay | Admitting: Neurology

## 2019-07-03 NOTE — Telephone Encounter (Signed)
lmtcb re:  Alda Berthold, DO  Curl, Starla  Please schedule EMG appointment (BLE), thanks

## 2019-07-04 NOTE — Telephone Encounter (Signed)
LMTCB to schedule EMG.

## 2019-07-05 NOTE — Telephone Encounter (Signed)
LMTCB to schedule EMG.

## 2019-07-06 ENCOUNTER — Encounter (HOSPITAL_COMMUNITY): Payer: Self-pay | Admitting: *Deleted

## 2019-07-06 NOTE — Telephone Encounter (Signed)
Patient has not returned multiple calls for scheduling his EMG.   Okay to send letter to patient that we've been trying to reach him?

## 2019-07-07 ENCOUNTER — Telehealth: Payer: Self-pay | Admitting: Neurology

## 2019-07-07 ENCOUNTER — Encounter: Payer: Self-pay | Admitting: Neurology

## 2019-07-07 NOTE — Telephone Encounter (Signed)
OK to send letter

## 2019-07-12 ENCOUNTER — Encounter: Payer: Self-pay | Admitting: Cardiology

## 2019-07-12 ENCOUNTER — Ambulatory Visit (INDEPENDENT_AMBULATORY_CARE_PROVIDER_SITE_OTHER): Payer: No Typology Code available for payment source | Admitting: Cardiology

## 2019-07-12 ENCOUNTER — Other Ambulatory Visit: Payer: Self-pay

## 2019-07-12 VITALS — BP 124/70 | HR 59 | Ht 71.0 in | Wt 215.0 lb

## 2019-07-12 DIAGNOSIS — I5042 Chronic combined systolic (congestive) and diastolic (congestive) heart failure: Secondary | ICD-10-CM

## 2019-07-12 DIAGNOSIS — I43 Cardiomyopathy in diseases classified elsewhere: Secondary | ICD-10-CM

## 2019-07-12 DIAGNOSIS — I5022 Chronic systolic (congestive) heart failure: Secondary | ICD-10-CM | POA: Diagnosis not present

## 2019-07-12 DIAGNOSIS — E854 Organ-limited amyloidosis: Secondary | ICD-10-CM

## 2019-07-12 DIAGNOSIS — I2510 Atherosclerotic heart disease of native coronary artery without angina pectoris: Secondary | ICD-10-CM

## 2019-07-12 MED ORDER — TORSEMIDE 20 MG PO TABS: 40.0000 mg | ORAL_TABLET | Freq: Two times a day (BID) | ORAL | 3 refills | Status: DC

## 2019-07-12 NOTE — Patient Instructions (Signed)
Medication Instructions:  CHANGE TORSEMIDE TO 40 MG TWICE DAILY= 2 OF THE 20 MG TABLETS TWICE DAILY  *If you need a refill on your cardiac medications before your next appointment, please call your pharmacy*  Lab Work: Your physician recommends that you return for lab work in: [place]  If you have labs (blood work) drawn today and your tests are completely normal, you will receive your results only by: Marland Kitchen MyChart Message (if you have MyChart) OR . A paper copy in the mail If you have any lab test that is abnormal or we need to change your treatment, we will call you to review the results.  Follow-Up: At [facility], you and your health needs are our priority.  As part of our continuing mission to provide you with exceptional heart care, we have created designated Provider Care Teams.  These Care Teams include your primary Cardiologist (physician) and Advanced Practice Providers (APPs -  Physician Assistants and Nurse Practitioners) who all work together to provide you with the care you need, when you need it.  Your next appointment:   8 week(s)  The format for your next appointment:   In Person  Provider:   Kirk Ruths, MD

## 2019-07-27 ENCOUNTER — Telehealth (HOSPITAL_COMMUNITY): Payer: Self-pay | Admitting: *Deleted

## 2019-07-27 ENCOUNTER — Telehealth: Payer: Self-pay | Admitting: Cardiology

## 2019-07-27 LAB — BASIC METABOLIC PANEL
BUN/Creatinine Ratio: 22 (ref 10–24)
BUN: 36 mg/dL — ABNORMAL HIGH (ref 8–27)
CO2: 26 mmol/L (ref 20–29)
Calcium: 9.6 mg/dL (ref 8.6–10.2)
Chloride: 93 mmol/L — ABNORMAL LOW (ref 96–106)
Creatinine, Ser: 1.65 mg/dL — ABNORMAL HIGH (ref 0.76–1.27)
GFR calc Af Amer: 45 mL/min/{1.73_m2} — ABNORMAL LOW (ref >59)
GFR calc non Af Amer: 39 mL/min/{1.73_m2} — ABNORMAL LOW (ref >59)
Glucose: 141 mg/dL — ABNORMAL HIGH (ref 65–99)
Potassium: 4.1 mmol/L (ref 3.5–5.2)
Sodium: 137 mmol/L (ref 134–144)

## 2019-07-27 NOTE — Telephone Encounter (Signed)
Patients wife called stating she has been trying to schedule patient an appt but has not received auth from the [place]. Pts wife said she spoke to [place] about this and that Nira Conn was sending a letter to the [place]. I spoke with Barnett Applebaum who handles [facility] referrals and she stated Nira Conn was following up on this. Auth for our clinic was denied because we referred patient to [facility] so that cancelled out our referral. I am unfamiliar with [facility] protocol and will forward to [facility].

## 2019-07-27 NOTE — Telephone Encounter (Signed)
Contacted patient wife- she states she has questions and would like to speak to [place].   Will route to RN.   Thank you!

## 2019-07-27 NOTE — Telephone Encounter (Signed)
° °  Pt's wife is returning call regarding blood work results. Please call her mobile number.

## 2019-07-31 NOTE — Telephone Encounter (Signed)
Spoke with pt wife, she is asking what she can do to increase his appetite. She reports he does not eat. He complains of a cough that he reports is sputum coming up in his mouth. He was started on some thing for reflux but they have no really seen a change. She was given the okay to increase to twice daily to see if that helps. She will check into supplemental drinks for calories but is aware to watch out for sodium in those drinks. She will also reach out to his medical doctor for suggestions.

## 2019-08-03 ENCOUNTER — Other Ambulatory Visit: Payer: Self-pay

## 2019-08-03 ENCOUNTER — Inpatient Hospital Stay (HOSPITAL_COMMUNITY)
Admission: EM | Admit: 2019-08-03 | Discharge: 2019-08-15 | DRG: 308 | Disposition: A | Discharge status: Hospice | Payer: No Typology Code available for payment source | Attending: Cardiology | Admitting: Cardiology

## 2019-08-03 ENCOUNTER — Emergency Department (HOSPITAL_COMMUNITY): Payer: No Typology Code available for payment source

## 2019-08-03 DIAGNOSIS — I5043 Acute on chronic combined systolic (congestive) and diastolic (congestive) heart failure: Secondary | ICD-10-CM | POA: Diagnosis present

## 2019-08-03 DIAGNOSIS — E854 Organ-limited amyloidosis: Secondary | ICD-10-CM | POA: Diagnosis present

## 2019-08-03 DIAGNOSIS — R531 Weakness: Secondary | ICD-10-CM

## 2019-08-03 DIAGNOSIS — N185 Chronic kidney disease, stage 5: Secondary | ICD-10-CM

## 2019-08-03 DIAGNOSIS — I959 Hypotension, unspecified: Secondary | ICD-10-CM

## 2019-08-03 DIAGNOSIS — Z87891 Personal history of nicotine dependence: Secondary | ICD-10-CM

## 2019-08-03 DIAGNOSIS — N170 Acute kidney failure with tubular necrosis: Secondary | ICD-10-CM | POA: Diagnosis present

## 2019-08-03 DIAGNOSIS — I2510 Atherosclerotic heart disease of native coronary artery without angina pectoris: Secondary | ICD-10-CM | POA: Diagnosis present

## 2019-08-03 DIAGNOSIS — N184 Chronic kidney disease, stage 4 (severe): Secondary | ICD-10-CM | POA: Diagnosis not present

## 2019-08-03 DIAGNOSIS — N183 Chronic kidney disease, stage 3 unspecified: Secondary | ICD-10-CM | POA: Diagnosis present

## 2019-08-03 DIAGNOSIS — Z888 Allergy status to other drugs, medicaments and biological substances status: Secondary | ICD-10-CM

## 2019-08-03 DIAGNOSIS — Z89421 Acquired absence of other right toe(s): Secondary | ICD-10-CM

## 2019-08-03 DIAGNOSIS — I5033 Acute on chronic diastolic (congestive) heart failure: Secondary | ICD-10-CM | POA: Diagnosis not present

## 2019-08-03 DIAGNOSIS — I5023 Acute on chronic systolic (congestive) heart failure: Secondary | ICD-10-CM | POA: Diagnosis not present

## 2019-08-03 DIAGNOSIS — I9589 Other hypotension: Secondary | ICD-10-CM | POA: Diagnosis not present

## 2019-08-03 DIAGNOSIS — I499 Cardiac arrhythmia, unspecified: Secondary | ICD-10-CM | POA: Diagnosis not present

## 2019-08-03 DIAGNOSIS — I480 Paroxysmal atrial fibrillation: Secondary | ICD-10-CM | POA: Diagnosis present

## 2019-08-03 DIAGNOSIS — E859 Amyloidosis, unspecified: Secondary | ICD-10-CM | POA: Diagnosis not present

## 2019-08-03 DIAGNOSIS — I5084 End stage heart failure: Secondary | ICD-10-CM | POA: Diagnosis present

## 2019-08-03 DIAGNOSIS — I428 Other cardiomyopathies: Secondary | ICD-10-CM | POA: Diagnosis present

## 2019-08-03 DIAGNOSIS — Z20822 Contact with and (suspected) exposure to covid-19: Secondary | ICD-10-CM | POA: Diagnosis present

## 2019-08-03 DIAGNOSIS — R34 Anuria and oliguria: Secondary | ICD-10-CM | POA: Diagnosis not present

## 2019-08-03 DIAGNOSIS — Z7189 Other specified counseling: Secondary | ICD-10-CM

## 2019-08-03 DIAGNOSIS — I484 Atypical atrial flutter: Secondary | ICD-10-CM | POA: Diagnosis present

## 2019-08-03 DIAGNOSIS — Z66 Do not resuscitate: Secondary | ICD-10-CM

## 2019-08-03 DIAGNOSIS — Z515 Encounter for palliative care: Secondary | ICD-10-CM | POA: Diagnosis not present

## 2019-08-03 DIAGNOSIS — N179 Acute kidney failure, unspecified: Secondary | ICD-10-CM | POA: Diagnosis present

## 2019-08-03 DIAGNOSIS — I441 Atrioventricular block, second degree: Secondary | ICD-10-CM | POA: Diagnosis present

## 2019-08-03 DIAGNOSIS — Z79899 Other long term (current) drug therapy: Secondary | ICD-10-CM | POA: Diagnosis not present

## 2019-08-03 DIAGNOSIS — Z7901 Long term (current) use of anticoagulants: Secondary | ICD-10-CM

## 2019-08-03 DIAGNOSIS — N171 Acute kidney failure with acute cortical necrosis: Secondary | ICD-10-CM | POA: Diagnosis not present

## 2019-08-03 DIAGNOSIS — R000 Tachycardia, unspecified: Secondary | ICD-10-CM | POA: Diagnosis not present

## 2019-08-03 DIAGNOSIS — R0602 Shortness of breath: Secondary | ICD-10-CM

## 2019-08-03 DIAGNOSIS — I130 Hypertensive heart and chronic kidney disease with heart failure and stage 1 through stage 4 chronic kidney disease, or unspecified chronic kidney disease: Secondary | ICD-10-CM | POA: Diagnosis present

## 2019-08-03 DIAGNOSIS — I471 Supraventricular tachycardia: Principal | ICD-10-CM | POA: Diagnosis present

## 2019-08-03 DIAGNOSIS — I43 Cardiomyopathy in diseases classified elsewhere: Secondary | ICD-10-CM | POA: Diagnosis not present

## 2019-08-03 DIAGNOSIS — R0600 Dyspnea, unspecified: Secondary | ICD-10-CM

## 2019-08-03 DIAGNOSIS — Z79891 Long term (current) use of opiate analgesic: Secondary | ICD-10-CM

## 2019-08-03 DIAGNOSIS — Z880 Allergy status to penicillin: Secondary | ICD-10-CM

## 2019-08-03 LAB — CBC WITH DIFFERENTIAL/PLATELET
Abs Immature Granulocytes: 0.04 K/uL (ref 0.00–0.07)
Basophils Absolute: 0.1 K/uL (ref 0.0–0.1)
Basophils Relative: 1 %
Eosinophils Absolute: 0.2 K/uL (ref 0.0–0.5)
Eosinophils Relative: 2 %
HCT: 53.3 % — ABNORMAL HIGH (ref 39.0–52.0)
Hemoglobin: 16.9 g/dL (ref 13.0–17.0)
Immature Granulocytes: 0 %
Lymphocytes Relative: 21 %
Lymphs Abs: 2.2 K/uL (ref 0.7–4.0)
MCH: 28 pg (ref 26.0–34.0)
MCHC: 31.7 g/dL (ref 30.0–36.0)
MCV: 88.2 fL (ref 80.0–100.0)
Monocytes Absolute: 1.2 K/uL — ABNORMAL HIGH (ref 0.1–1.0)
Monocytes Relative: 12 %
Neutro Abs: 6.6 K/uL (ref 1.7–7.7)
Neutrophils Relative %: 64 %
Platelets: 305 K/uL (ref 150–400)
RBC: 6.04 MIL/uL — ABNORMAL HIGH (ref 4.22–5.81)
RDW: 15.3 % (ref 11.5–15.5)
WBC: 10.3 K/uL (ref 4.0–10.5)
nRBC: 0 % (ref 0.0–0.2)

## 2019-08-03 LAB — BASIC METABOLIC PANEL WITH GFR
Anion gap: 17 — ABNORMAL HIGH (ref 5–15)
BUN: 57 mg/dL — ABNORMAL HIGH (ref 8–23)
CO2: 23 mmol/L (ref 22–32)
Calcium: 9 mg/dL (ref 8.9–10.3)
Chloride: 91 mmol/L — ABNORMAL LOW (ref 98–111)
Creatinine, Ser: 2.03 mg/dL — ABNORMAL HIGH (ref 0.61–1.24)
GFR calc Af Amer: 35 mL/min — ABNORMAL LOW
GFR calc non Af Amer: 30 mL/min — ABNORMAL LOW
Glucose, Bld: 119 mg/dL — ABNORMAL HIGH (ref 70–99)
Potassium: 5.2 mmol/L — ABNORMAL HIGH (ref 3.5–5.1)
Sodium: 131 mmol/L — ABNORMAL LOW (ref 135–145)

## 2019-08-03 LAB — C-REACTIVE PROTEIN: CRP: 0.5 mg/dL (ref <1.0)

## 2019-08-03 LAB — CK: Total CK: 72 U/L (ref 49–397)

## 2019-08-03 LAB — MAGNESIUM: Magnesium: 2.4 mg/dL (ref 1.7–2.4)

## 2019-08-03 LAB — BRAIN NATRIURETIC PEPTIDE: B Natriuretic Peptide: 901.9 pg/mL — ABNORMAL HIGH (ref 0.0–100.0)

## 2019-08-03 LAB — SARS CORONAVIRUS 2 (TAT 6-24 HRS): SARS Coronavirus 2: NEGATIVE

## 2019-08-03 LAB — D-DIMER, QUANTITATIVE: D-Dimer, Quant: 5.43 ug{FEU}/mL — ABNORMAL HIGH (ref 0.00–0.50)

## 2019-08-03 MED ORDER — ACETAMINOPHEN 325 MG PO TABS
650.0000 mg | ORAL_TABLET | ORAL | Status: DC | PRN
  Administered 2019-08-06 – 2019-08-14 (×8): 650 mg via ORAL
  Filled 2019-08-03 (×10): qty 2

## 2019-08-03 MED ORDER — POLYETHYLENE GLYCOL 3350 17 G PO PACK
17.0000 g | PACK | Freq: Two times a day (BID) | ORAL | Status: DC
  Administered 2019-08-03 – 2019-08-15 (×13): 17 g via ORAL
  Filled 2019-08-03 (×17): qty 1

## 2019-08-03 MED ORDER — TAFAMIDIS 61 MG PO CAPS: 61.0000 mg | ORAL_CAPSULE | Freq: Every day | ORAL | Status: DC

## 2019-08-03 MED ORDER — METOPROLOL TARTRATE 12.5 MG HALF TABLET: 12.5000 mg | ORAL_TABLET | Freq: Two times a day (BID) | ORAL | Status: DC

## 2019-08-03 MED ORDER — APIXABAN 5 MG PO TABS
5.0000 mg | ORAL_TABLET | Freq: Two times a day (BID) | ORAL | Status: DC
  Administered 2019-08-03 – 2019-08-13 (×18): 5 mg via ORAL
  Filled 2019-08-03 (×19): qty 1

## 2019-08-03 MED ORDER — SODIUM CHLORIDE 0.9% FLUSH
3.0000 mL | INTRAVENOUS | Status: DC | PRN
  Administered 2019-08-11: 3 mL via INTRAVENOUS

## 2019-08-03 MED ORDER — SODIUM CHLORIDE 0.9 % IV BOLUS
1000.0000 mL | Freq: Once | INTRAVENOUS | Status: AC
  Administered 2019-08-03: 1000 mL via INTRAVENOUS

## 2019-08-03 MED ORDER — METOPROLOL TARTRATE 12.5 MG HALF TABLET
12.5000 mg | ORAL_TABLET | Freq: Two times a day (BID) | ORAL | Status: DC
  Administered 2019-08-03 – 2019-08-04 (×2): 12.5 mg via ORAL
  Filled 2019-08-03 (×2): qty 1

## 2019-08-03 MED ORDER — BISACODYL 5 MG PO TBEC
10.0000 mg | DELAYED_RELEASE_TABLET | Freq: Once | ORAL | Status: AC
  Administered 2019-08-03: 10 mg via ORAL
  Filled 2019-08-03: qty 2

## 2019-08-03 MED ORDER — LACTATED RINGERS IV SOLN: INTRAVENOUS | Status: DC

## 2019-08-03 MED ORDER — DOCUSATE SODIUM 100 MG PO CAPS
200.0000 mg | ORAL_CAPSULE | Freq: Two times a day (BID) | ORAL | Status: DC
  Administered 2019-08-03 – 2019-08-15 (×17): 200 mg via ORAL
  Filled 2019-08-03 (×22): qty 2

## 2019-08-03 MED ORDER — ONDANSETRON HCL 4 MG/2ML IJ SOLN
4.0000 mg | Freq: Four times a day (QID) | INTRAMUSCULAR | Status: DC | PRN
  Administered 2019-08-04 – 2019-08-12 (×9): 4 mg via INTRAVENOUS
  Filled 2019-08-03 (×9): qty 2

## 2019-08-03 MED ORDER — TORSEMIDE 20 MG PO TABS
40.0000 mg | ORAL_TABLET | Freq: Two times a day (BID) | ORAL | Status: DC
  Administered 2019-08-03 – 2019-08-04 (×2): 40 mg via ORAL
  Filled 2019-08-03 (×2): qty 2

## 2019-08-03 MED ORDER — SODIUM CHLORIDE 0.9 % IV SOLN
250.0000 mL | INTRAVENOUS | Status: DC | PRN
  Administered 2019-08-04: 30 mL via INTRAVENOUS

## 2019-08-03 MED ORDER — LATANOPROST 0.005 % OP SOLN
1.0000 [drp] | Freq: Every day | OPHTHALMIC | Status: DC
  Administered 2019-08-04 – 2019-08-14 (×11): 1 [drp] via OPHTHALMIC
  Filled 2019-08-03: qty 2.5

## 2019-08-03 MED ORDER — SODIUM CHLORIDE 0.9% FLUSH
3.0000 mL | Freq: Two times a day (BID) | INTRAVENOUS | Status: DC
  Administered 2019-08-03 – 2019-08-15 (×20): 3 mL via INTRAVENOUS

## 2019-08-03 MED ORDER — DOFETILIDE 125 MCG PO CAPS: 125.0000 ug | ORAL_CAPSULE | Freq: Two times a day (BID) | ORAL | Status: DC

## 2019-08-03 MED ORDER — MIDODRINE HCL 5 MG PO TABS: 5.0000 mg | ORAL_TABLET | Freq: Three times a day (TID) | ORAL | Status: DC

## 2019-08-03 MED ORDER — SODIUM POLYSTYRENE SULFONATE 15 GM/60ML PO SUSP: 30.0000 g | Freq: Once | ORAL | Status: DC

## 2019-08-03 NOTE — ED Triage Notes (Signed)
Pt BIB [facility] from his 23 office for a-flutter. Per EMS patient has been feeling unwell since Saturday with decreased appetite, increased generalized weakness. Pt also reports he has not had a bowel movement since last Thursday.

## 2019-08-03 NOTE — ED Notes (Signed)
Gildardo Griffes (wife) [phone] please call for update

## 2019-08-03 NOTE — ED Provider Notes (Signed)
[facility] EMERGENCY DEPARTMENT Provider Note   CSN: [id number] Arrival date & time: 08/03/19  1335     History Chief Complaint  Patient presents with  . Atrial Flutter    Christopher Benton is a 79 y.o. male with past medical history of CAD, CHF with EF of 35 to 40%, A. fib presenting to the ED with a chief complaint of generalized weakness and fatigue.  Reports for the past 2 to 3 days has been feeling generally weak, decreased activity appetite level.  Was seen and evaluated by his PCP this morning and sent to the ER as he was found to be in atrial flutter.  Remainder of history is limited as patient is requesting [name] to speak to his wife. He denies any palpitations.  HPI     Past Medical History:  Diagnosis Date  . Arthritis    "left foot" (06/01/2018)  . Atrial fibrillation ([place])   . CAD (coronary artery disease)   . Cardiomyopathy ([place])   . Cellulitis and abscess of left leg 02/08/2019  . Chronic systolic CHF (congestive heart failure) ([place]) 04/12/2016  . Dyspnea   . Dysrhythmia Atrial flutter  . 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 Hypertension     Patient Active Problem List   Diagnosis Date Noted  . Cellulitis 02/08/2019  . Acute on chronic combined systolic and diastolic CHF (congestive heart failure) ([place]) 02/08/2019  . CKD (chronic kidney disease), stage III 02/08/2019  . 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 Pulmonary edema 05/31/2018  . Acute on chronic diastolic CHF (congestive heart failure) ([place]) 05/31/2018  . Acute on chronic systolic CHF (congestive heart failure) ([place])   . Shortness of breath 03/08/2018  . AKI (acute kidney injury) ([place])   . Elevated troponin   . Pleural effusion on right 04/12/2016  . Acute on chronic systolic congestive heart failure ([place]) 04/12/2016  . CAD (coronary artery disease) 11/27/2015  . PAF (paroxysmal atrial fibrillation) ([place]) 09/25/2015  . Cardiomyopathy ([place])  09/25/2015  . Typical atrial flutter ([place])   . Atypical atrial flutter ([place]) 08/28/2015  . Essential hypertension 08/28/2015    Past Surgical History:  Procedure Laterality Date  . CARDIOVERSION N/A 09/04/2015   Procedure: CARDIOVERSION;  Surgeon: Skeet Latch, MD;  Location: [facility];  Service: Cardiovascular;  Laterality: N/A;  . IR THORACENTESIS ASP PLEURAL SPACE W/IMG GUIDE  06/02/2018  . TEE WITHOUT CARDIOVERSION N/A 09/04/2015   Procedure: TRANSESOPHAGEAL ECHOCARDIOGRAM (TEE);  Surgeon: Skeet Latch, MD;  Location: [facility];  Service: Cardiovascular;  Laterality: N/A;  . TEE WITHOUT CARDIOVERSION N/A 04/15/2016   Procedure: TRANSESOPHAGEAL ECHOCARDIOGRAM (TEE);  Surgeon: Jerline Pain, MD;  Location: [facility] ENDOSCOPY;  Service: Cardiovascular;  Laterality: N/A;  . 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 Heart disease Other        No family history    Social History   Tobacco Use  . Smoking status: Former Smoker    Packs/day: 0.12    Types: Cigarettes    Quit date: 1990    Years since quitting: 31.1  . Smokeless tobacco: Never Used  . Tobacco comment: 06/01/2018 "smoked off and on for 30 years; don't know how many years I actually smoked"  Substance Use Topics  . Alcohol use: Not Currently    Comment: \  . Drug use: Never  Home Medications Prior to Admission medications   Medication Sig Start Date End Date Taking? Authorizing Provider  AMBULATORY NON FORMULARY MEDICATION Shower chair 06/30/19   Narda Amber K, DO  apixaban (ELIQUIS) 5 MG TABS tablet Take 1 tablet (5 mg total) by mouth 2 (two) times daily. 08/28/15   Lelon Perla, MD  carvedilol (COREG) 6.25 MG tablet Take 3.125 mg by mouth 2 (two) times daily with a meal.    [provider]  dofetilide (TIKOSYN) 125 MCG capsule Take 1 capsule (125 mcg total) by mouth 2 (two) times daily. 06/07/18   Shirley Friar, PA-C  dorzolamide  (TRUSOPT) 2 % ophthalmic solution Place 1 drop into both eyes 2 (two) times daily.    [provider]  HYDROcodone-acetaminophen (NORCO/VICODIN) 5-325 MG tablet Take 1 tablet by mouth every 4 (four) hours as needed. 05/19/19   Isla Pence, MD  latanoprost (XALATAN) 0.005 % ophthalmic solution Place 1 drop into both eyes at bedtime.    [provider]  metolazone (ZAROXOLYN) 2.5 MG tablet Take 1 tablet (2.5 mg total) by mouth as directed. By HF clinic for swelling 04/13/19   Bensimhon, Shaune Pascal, MD  midodrine (PROAMATINE) 5 MG tablet Take 1 tablet (5 mg total) by mouth 3 (three) times daily with meals. 04/06/19   Bensimhon, Shaune Pascal, MD  Multiple Vitamins-Minerals (ONE-A-DAY MENS 50+ ADVANTAGE) TABS Take 1 tablet by mouth daily with breakfast.    [provider]  Polyvinyl Alcohol (LIQUID TEARS OP) Place 1-2 drops into both eyes as needed (for dry eyes).    [provider]  potassium chloride SA (K-DUR) 20 MEQ tablet Take 2 tablets (40 mEq total) by mouth every morning AND 1 tablet (20 mEq total) every evening. Patient taking differently: Take 20 mEq by mouth in the morning and 20 mEq at bedtime 01/30/19   Bensimhon, Shaune Pascal, MD  Tafamidis 61 MG CAPS Take 61 mg by mouth daily after breakfast.     [provider]  torsemide (DEMADEX) 20 MG tablet Take 2 tablets (40 mg total) by mouth 2 (two) times daily. 07/12/19 10/10/19  Lelon Perla, MD  traMADol (ULTRAM) 50 MG tablet Take 1 tablet (50 mg total) by mouth 2 (two) times daily as needed for severe pain. 05/25/19   Bensimhon, Shaune Pascal, MD    Allergies    Pradaxa [dabigatran etexilate mesylate] and Penicillins  Review of Systems   Review of Systems  Constitutional: Positive for fatigue. Negative for appetite change, chills and fever.  HENT: Negative for ear pain, rhinorrhea, sneezing and sore throat.   Eyes: Negative for photophobia and visual disturbance.  Respiratory: Negative for cough, chest  tightness, shortness of breath and wheezing.   Cardiovascular: Negative for chest pain and palpitations.  Gastrointestinal: Negative for abdominal pain, blood in stool, constipation, diarrhea, nausea and vomiting.  Genitourinary: Negative for dysuria, hematuria and urgency.  Musculoskeletal: Negative for myalgias.  Skin: Negative for rash.  Neurological: Negative for dizziness, weakness and light-headedness.    Physical Exam Updated Vital Signs BP 99/76   Pulse (!) 139   Temp 98.2 F (36.8 C)   Resp (!) 23   Ht 5' 11.5" (1.816 m)   Wt 95.3 kg   SpO2 94%   BMI 28.88 kg/m   Physical Exam Vitals and nursing note reviewed.  Constitutional:      General: He is not in acute distress.    Appearance: He is well-developed.  HENT:     Head: Normocephalic  and atraumatic.     Nose: Nose normal.  Eyes:     General: No scleral icterus.       Left eye: No discharge.     Conjunctiva/sclera: Conjunctivae normal.  Cardiovascular:     Rate and Rhythm: Regular rhythm. Tachycardia present.     Heart sounds: Normal heart sounds. No murmur. No friction rub. No gallop.   Pulmonary:     Effort: Pulmonary effort is normal. No respiratory distress.     Breath sounds: Normal breath sounds.  Abdominal:     General: Bowel sounds are normal. There is no distension.     Palpations: Abdomen is soft.     Tenderness: There is no abdominal tenderness. There is no guarding.  Musculoskeletal:        General: No swelling or tenderness. Normal range of motion.     Cervical back: Normal range of motion and neck supple.     Right lower leg: No edema.     Left lower leg: No edema.  Skin:    General: Skin is warm and dry.     Findings: No rash.  Neurological:     Mental Status: He is alert.     Motor: No abnormal muscle tone.     Coordination: Coordination normal.     ED Results / Procedures / Treatments   Labs (all labs ordered are listed, but only abnormal results are displayed) Labs Reviewed    BASIC METABOLIC PANEL - Abnormal; Notable for the following components:      Result Value   Sodium 131 (*)    Potassium 5.2 (*)    Chloride 91 (*)    Glucose, Bld 119 (*)    BUN 57 (*)    Creatinine, Ser 2.03 (*)    GFR calc non Af Amer 30 (*)    GFR calc Af Amer 35 (*)    Anion gap 17 (*)    All other components within normal limits  CBC WITH DIFFERENTIAL/PLATELET - Abnormal; Notable for the following components:   RBC 6.04 (*)    HCT 53.3 (*)    Monocytes Absolute 1.2 (*)    All other components within normal limits  SARS CORONAVIRUS 2 (TAT 6-24 HRS)  BRAIN NATRIURETIC PEPTIDE    EKG EKG Interpretation  Date/Time:  Thursday August 03 2019 13:45:20 EST Ventricular Rate:  138 PR Interval:    QRS Duration: 102 QT Interval:  373 QTC Calculation: 566 R Axis:   -135 Text Interpretation: Accelerated junctional rhythm Anterolateral infarct, old Prolonged QT interval Confirmed by Gerlene Fee [phone]) on 08/03/2019 1:53:38 PM   Radiology DG Chest Portable 1 View  Result Date: 08/03/2019 CLINICAL DATA:  Pt came from his 57 office for a-flutter. Per EMS patient has been feeling unwell since Saturday with decreased appetite, increased generalized weakness. Pt also reports he has not had a bowel movement since last Thursday. Hx HTN. weakness EXAM: PORTABLE CHEST 1 VIEW COMPARISON:  05/19/2019 FINDINGS: Normal cardiac silhouette. There is bilateral pleural effusions which are moderate volume. Upper lungs clear. No pneumothorax. IMPRESSION: Low lung volumes and bilateral moderate pleural effusions. Electronically Signed   By: Suzy Bouchard M.D.   On: 08/03/2019 14:29    Procedures Procedures (including critical care time)  Medications Ordered in ED Medications  sodium chloride 0.9 % bolus 1,000 mL (1,000 mLs Intravenous New Bag/Given 08/03/19 1415)    ED Course  I have reviewed the triage vital signs and the nursing notes.  Pertinent labs &  imaging results that were  available during my care of the patient were reviewed by me and considered in my medical decision making (see chart for details).    MDM Rules/Calculators/A&P                      KEVYN BOQUET was evaluated in Emergency Department on 08/03/19 for the symptoms described in the history of present illness. He/she was evaluated in the context of the global COVID-19 pandemic, which necessitated consideration that the patient might be at risk for infection with the SARS-CoV-2 virus that causes COVID-19. Institutional protocols and algorithms that pertain to the evaluation of patients at risk for COVID-19 are in a state of rapid change based on information released by regulatory bodies including the CDC and federal and state organizations. These policies and algorithms were followed during the patient's care in the ED.  79 year old male presents to ED with a chief complaint of generalized weakness and fatigue for the past 2 to 3 days.  Patient found to be tachycardic to 130s here, junctional rhythm seen on EKG today reviewed by Dr. Sedonia Small.  Patient hypotensive to 94 systolic.  Patient with oxygen saturations around 91% on room air, placed on 2 L for comfort.  No deficits neurological exam noted.  He has some tenderness of the left lower extremity and he reports he has cellulitis in this leg.  I reviewed his echo from last year, shows that he has an EF of about 40%.  Work appears significant for creatinine of 2.03, elevated from his baseline of what appears to be 1.5.  Anion gap of 17.  Potassium of 5.2.  BNP pending.  Covid test pending.  Patient was given IV fluids without significant improvement in his pulse, did increase his blood pressure slightly.  Patient need to be admitted to medicine service with cardiology consult as well. Dr. Candiss Norse, hospitalist to place orders on patient.   Final Clinical Impression(s) / ED Diagnoses Final diagnoses:  AKI (acute kidney injury) ([place])  Tachycardia, unspecified     Rx / DC Orders ED Discharge Orders    None      Portions of this note were generated with Dragon dictation software. Dictation errors may occur despite best attempts at proofreading.    Delia Heady, PA-C 08/03/19 1540    Maudie Flakes, MD 08/08/19 1624

## 2019-08-03 NOTE — H&P (Addendum)
Cardiology Admission History and Physical:   Patient ID: Christopher Benton MRN: [id number]; DOB: 1941-02-07   Admission date: 08/03/2019  Primary Care Provider: Default, Provider, MD Primary Cardiologist: Kirk Ruths, MD  Primary Electrophysiologist:  None   Chief Complaint:  SVT, shortness of breath  Patient Profile:   BRAXDON GAPPA is a 79 y.o. male with history of atrial fib/flutter, coronary artery calcifications on prior CT, amyloidosis, chronic systolic CHF with EF 73-71% and hypertension.  History of Present Illness:   Mr. Griffith is a 79 y.o. male with history of atrial fib/flutter, coronary artery calcifications on prior CT, amyloidosis, chronic systolic CHF with EF 06-26% and hypertension.   He has a history of stress test 8/17 at [facility] that showed EF 41 and no ischemia. Pt admitted 11/17 with dyspnea and right pleural effusion. Had thoracentesis and cytology negative; diuresed. TEE 11/17 showed EF 35-40, biatrial enlargement and moderate TR. Echocardiogram October 2019 showed ejection fraction 45 to 50%; severe left ventricular hypertrophy and amyloid should be considered; moderate diastolic dysfunction; mildly dilated ascending aorta, biatrial enlargement and mild tricuspid regurgitation. CTA October 2019 showed large right pleural effusion. 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   He was recently seen in the office by Dr. Stanford Breed on 07/12/2019 at which time he was having dyspnea on exertion, unchanged, and bilateral lower extremity edema.  No chest pain or syncope. Recent CT showed ascities and bilateral pleural effusions, right > left. He was felt to be volume overloaded and demadex was increased to 40 mg BID along with continued fluid restriction and low sodium diet. He was advised to take additional  metolazone 2.5 mg as needed for weight gain of 2-3 pounds.  On my interview Mr. Tillman asks to involve his wife for better information. I got her on speaker phone. She reports that Mr. Luger began to feel bad on Saturday. He had decreased energy, shortness of breath, orthopnea and decreased appetite. He was also coughing, "a deep cough" with clear/white phlegm that they thought was coming from his stomach. He hahs been taking all of his medications as directed and she gave him prn metolazone for 3 days. She says that his breathing improved a little but he continued to feel very fatigued. He has had some mild chest discomfort only with coughing. He denies palpitations, but has had some mild lightheadedness upon standing that he attributes to not eating.   Chronic lower extremity edema right > left.  His wife says that it is currently stable.  He uses compression stockings and uses wraps and a machine at home to help relieve lower extremity edema.  Patient has as needed midodrine noted on his med list but has not had to use any recently as blood pressure has been stable.  His EKG and tele show SVT. Dr. Stanford Breed tried carotid massage and valsalva maneuver which broke his rhythm into sinus rhythm briefly but it reverted right back to SVT.     Heart Pathway Score:     Past Medical History:  Diagnosis Date  . Arthritis    "left foot" (06/01/2018)  . Atrial fibrillation ([place])   . CAD (coronary artery disease)   . Cardiomyopathy ([place])   . Cellulitis and abscess of left leg 02/08/2019  . Chronic systolic CHF (congestive heart failure) ([place]) 04/12/2016  . Dyspnea   . Dysrhythmia Atrial flutter  . Hepatitis 1960   "?  kind; went to hospital; got alot of shots" (06/01/2018)  . Hypertension     Past Surgical History:  Procedure Laterality Date  . CARDIOVERSION N/A 09/04/2015   Procedure: CARDIOVERSION;  Surgeon: Skeet Latch, MD;  Location: [place];  Service: Cardiovascular;  Laterality:  N/A;  . IR THORACENTESIS ASP PLEURAL SPACE W/IMG GUIDE  06/02/2018  . TEE WITHOUT CARDIOVERSION N/A 09/04/2015   Procedure: TRANSESOPHAGEAL ECHOCARDIOGRAM (TEE);  Surgeon: Skeet Latch, MD;  Location: [place];  Service: Cardiovascular;  Laterality: N/A;  . TEE WITHOUT CARDIOVERSION N/A 04/15/2016   Procedure: TRANSESOPHAGEAL ECHOCARDIOGRAM (TEE);  Surgeon: Jerline Pain, MD;  Location: [facility] ENDOSCOPY;  Service: Cardiovascular;  Laterality: N/A;  . TOE AMPUTATION Right    4th digit     Medications Prior to Admission: Prior to Admission medications   Medication Sig Start Date End Date Taking? Authorizing Provider  AMBULATORY NON FORMULARY MEDICATION Shower chair 06/30/19   Narda Amber K, DO  apixaban (ELIQUIS) 5 MG TABS tablet Take 1 tablet (5 mg total) by mouth 2 (two) times daily. 08/28/15   Lelon Perla, MD  carvedilol (COREG) 6.25 MG tablet Take 3.125 mg by mouth 2 (two) times daily with a meal.    [provider]  dofetilide (TIKOSYN) 125 MCG capsule Take 1 capsule (125 mcg total) by mouth 2 (two) times daily. 06/07/18   Shirley Friar, PA-C  dorzolamide (TRUSOPT) 2 % ophthalmic solution Place 1 drop into both eyes 2 (two) times daily.    [provider]  HYDROcodone-acetaminophen (NORCO/VICODIN) 5-325 MG tablet Take 1 tablet by mouth every 4 (four) hours as needed. 05/19/19   Isla Pence, MD  latanoprost (XALATAN) 0.005 % ophthalmic solution Place 1 drop into both eyes at bedtime.    [provider]  metolazone (ZAROXOLYN) 2.5 MG tablet Take 1 tablet (2.5 mg total) by mouth as directed. By HF clinic for swelling 04/13/19   Bensimhon, Shaune Pascal, MD  midodrine (PROAMATINE) 5 MG tablet Take 1 tablet (5 mg total) by mouth 3 (three) times daily with meals. 04/06/19   Bensimhon, Shaune Pascal, MD  Multiple Vitamins-Minerals (ONE-A-DAY MENS 50+ ADVANTAGE) TABS Take 1 tablet by mouth daily with breakfast.    [provider]  Polyvinyl Alcohol (LIQUID  TEARS OP) Place 1-2 drops into both eyes as needed (for dry eyes).    [provider]  potassium chloride SA (K-DUR) 20 MEQ tablet Take 2 tablets (40 mEq total) by mouth every morning AND 1 tablet (20 mEq total) every evening. Patient taking differently: Take 20 mEq by mouth in the morning and 20 mEq at bedtime 01/30/19   Bensimhon, Shaune Pascal, MD  Tafamidis 61 MG CAPS Take 61 mg by mouth daily after breakfast.     [provider]  torsemide (DEMADEX) 20 MG tablet Take 2 tablets (40 mg total) by mouth 2 (two) times daily. 07/12/19 10/10/19  Lelon Perla, MD  traMADol (ULTRAM) 50 MG tablet Take 1 tablet (50 mg total) by mouth 2 (two) times daily as needed for severe pain. 05/25/19   Bensimhon, Shaune Pascal, MD     Allergies:    Allergies  Allergen Reactions  . Pradaxa [Dabigatran Etexilate Mesylate] Nausea Only  . Penicillins Rash    Did it involve swelling of the face/tongue/throat, SOB, or low BP? No Did it involve sudden or severe rash/hives, skin peeling, or any reaction on the inside of your mouth or nose? No Did you need to seek medical attention at a hospital  or doctor's office? Unk When did it last happen? "it happened many years ago" If all above answers are "NO", may proceed with cephalosporin use.     Social History:   Social History   Socioeconomic History  . Marital status: Married    Spouse name: Not on file  . Number of children: 2  . Years of education: Not on file  . Highest education level: Not on file  Occupational History  . Not on file  Tobacco Use  . Smoking status: Former Smoker    Packs/day: 0.12    Types: Cigarettes    Quit date: 1990    Years since quitting: 31.1  . Smokeless tobacco: Never Used  . Tobacco comment: 06/01/2018 "smoked off and on for 30 years; don't know how many years I actually smoked"  Substance and Sexual Activity  . Alcohol use: Not Currently    Comment: \  . Drug use: Never  . Sexual activity: Not on file  Other  Topics Concern  . 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     Social Determinants of Health   Financial Resource Strain:   . Difficulty of Paying Living Expenses: Not on file  Food Insecurity:   . Worried About Charity fundraiser in the Last Year: Not on file  . Ran Out of Food in the Last Year: Not on file  Transportation Needs:   . Lack of Transportation (Medical): Not on file  . Lack of Transportation (Non-Medical): Not on file  Physical Activity:   . Days of Exercise per Week: Not on file  . Minutes of Exercise per Session: Not on file  Stress:   . Feeling of Stress : Not on file  Social Connections:   . Frequency of Communication with Friends and Family: Not on file  . Frequency of Social Gatherings with Friends and Family: Not on file  . Attends Religious Services: Not on file  . Active Member of Clubs or Organizations: Not on file  . Attends Archivist Meetings: Not on file  . Marital Status: Not on file  Intimate Partner Violence:   . Fear of Current or Ex-Partner: Not on file  . Emotionally Abused: Not on file  . Physically Abused: Not on file  . Sexually Abused: Not on file    Family History:   The patient's family history includes Heart disease in an other family member.    ROS:  Please see the history of present illness.  All other ROS reviewed and negative.     Physical Exam/Data:   Vitals:   08/03/19 1345 08/03/19 1346  BP: 94/69   Pulse: (!) 138   Resp: (!) 23   Temp: 98.2 F (36.8 C)   SpO2: 91%   Weight:  95.3 kg  Height:  5' 11.5" (1.816 m)   No intake or output data in the 24 hours ending 08/03/19 1426 Last 3 Weights 08/03/2019 07/12/2019 05/19/2019  Weight (lbs) 210 lb 215 lb 229 lb 4.5 oz  Weight (kg) 95.255 kg 97.523 kg 104 kg     Body mass index is 28.88 kg/m.  General:  Well nourished, well developed, in no acute distress HEENT: normal Lymph: no adenopathy Neck: +  JVD Endocrine:  No thryomegaly Vascular: No carotid bruits; FA pulses 2+ bilaterally without bruits  Cardiac:  Tachycardic Lungs:  Diminished BS bases Abd: soft, nontender, no hepatomegaly  Ext: 1+ LLE, 1-2+ RLE edema Musculoskeletal:  No deformities, BUE and BLE strength normal and equal Skin: warm and dry  Neuro:  CNs 2-12 intact, no focal abnormalities noted Psych:  Normal affect    EKG:  The ECG that was done was personally reviewed and demonstrates junctional tachycardia vs aflutter with RVR, 138 bpm.   Relevant CV Studies:  Echocardiogram 12/06/2018  1. The left ventricle has moderately reduced systolic function, with an  ejection fraction of 35-40%. The cavity size was normal. There is  moderately increased left ventricular wall thickness. Left ventricular  diastolic Doppler parameters are consistent  with pseudonormalization.  2. The right ventricle has normal systolic function. The cavity was  normal. There is no increase in right ventricular wall thickness.  3. Left atrial size was moderately dilated.  4. Right atrial size was severely dilated.  5. Mild calcification of the aortic valve. Aortic valve regurgitation was  not assessed by color flow Doppler.  6. The inferior vena cava was dilated in size with <50% respiratory  variability.         Assessment and Plan:    SVT -Pt has been feeling poorly since Saturday. Found to be in SVT. It breaks briefly with carotid massage and valsalva but reverts to SVT.  -Dr. Stanford Breed is speaking with EP to explore options for treatment.  Acute on chronic systolic and diastolic CHF -Pt with known cardiomyopathy, amyloidosis. Echocardiogram July 2020 showed ejection fraction 35 to 40%, biatrial enlargement.  -Pt recently seen in the office with volume overload on 07/12/2019, demadex increased to 40 mg BID with prn metolazone.  -Pt presented to PCP at [facility] today with c/o weakness, shortness of breath and cough. Found to be in  SVT. Sent to [facility] ED.  -CXR with moderate bilateral pleural effusions.  -Pt has soft BP and was given a liter of IV fluids in the ED. -He has been having shortness of breath and orthopnea. Has chronic LE edema. Sx slightly improved with 3 days of PRN metolazone per wife. Still having symptoms, likely related to tachyarrhythmia.  -Check BNP. Will work on improving heart rhythm and then will likely need diuresis. For now will continue home demadex.   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   Hx of paroxysmal atrial fibrillation -On low-dose Tikosyn and has been maintaining sinus rhythm until currently. -On apixaban for stroke risk reduction. -Patient states that carvedilol was discontinued. -Plan to stop Tikosyn and consider amiodarone tomorrow. Last dose of Tikosyn this morning.   CKD stage III/AKI -Serum creatinine is elevated at 2.03, was 1.65 on 07/26/2019. Possibly related to decreased perfusion and has had 3 days of metolazone.  -Patient was given a liter of fluid by the ED provider. -Monitor renal function on labs.   CAD -Diagnosed based on prior CT showing coronary calcification.  Not on aspirin due to need for anticoagulation. -No anginal type chest pain.  Patient has some chest pain with deep coughing.   Severity of Illness: The appropriate patient status for this patient is INPATIENT. Inpatient status is judged to be reasonable and necessary in order to provide the required intensity of service to ensure the patient's safety. The patient's presenting symptoms, physical exam findings, and initial radiographic and laboratory data in the context of their chronic comorbidities is felt to place them at high risk for further clinical deterioration. Furthermore, it is not anticipated that the patient will be medically stable for discharge from the hospital within 2 midnights of admission. The following factors  support the patient status of inpatient.   " The patient's presenting symptoms  include fatigue, shortness of breath, cough, orthpnea. " The worrisome physical exam findings include JVD. " The initial radiographic and laboratory data are worrisome because of pleural effusions on CXR, SVT on EKG and tele. " The chronic co-morbidities include CHF, amyloidosis, PAF, CAD.   * I certify that at the point of admission it is my clinical judgment that the patient will require inpatient hospital care spanning beyond 2 midnights from the point of admission due to high intensity of service, high risk for further deterioration and high frequency of surveillance required.*    For questions or updates, please contact [place] Please consult [email or web address] for contact info under        Signed, Daune Perch, NP  08/03/2019 2:26 PM  As above, patient seen and examined.  Briefly he is a 79 year old male with past medical history of amyloid heart disease, paroxysmal atrial fibrillation/flutter, nonischemic cardiomyopathy, chronic stage III kidney disease, hypertension with supraventricular tachycardia and acute on chronic systolic congestive heart failure.  Most recent echocardiogram July 2020 showed ejection fraction 35 to 40%, moderate left ventricular hypertrophy, grade 2 diastolic dysfunction, biatrial enlargement.  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  Patient has been treated with tafamidis.  Patient states that since Saturday he has had increased dyspnea on exertion, fatigue and decreased appetite.  He went to the [place] today and was found to be in SVT and was transferred to [facility].  Cardiology now asked to evaluate.  Chest x-ray shows bilateral pleural effusions.  Sodium 131, potassium 5.2, BUN 57, creatinine 2.03.  BNP 902.  Hemoglobin 16.9.  Electrocardiogram shows supraventricular tachycardia, cannot rule out prior septal and lateral infarct.  1 incessant supraventricular tachycardia-patient's heart rate is in the 140-150 range.  He has  no palpitations. I think this is the likely reason for his increased fatigue, dyspnea and decreased appetite.  I performed carotid massage in the emergency room and patient would transiently convert to sinus rhythm with no atrial flutter waves.  However his SVT would recur despite prolonged massage.  I did not give adenosine given that we could break his SVT transiently with carotid massage.  Options for antiarrhythmic are limited.  I will discontinue Tikosyn that he has been on for atrial fibrillation.  We will wait 24 hours and then begin IV amiodarone.  Hopefully he will convert and remain in sinus rhythm.  His systolic blood pressure is 95 and I therefore have few options for AV nodal blocking agents.  I will try metoprolol 12.5 mg twice daily to see if he tolerates.  I discussed the above with Dr. Rayann Heman.  If above does not work we may need to consider ablation.  2 acute on chronic systolic congestive heart failure-I think most of patient's symptoms are related to his SVT.  He does have 2+ edema in his lower extremities which is chronic.  Hopefully if we reestablish sinus rhythm his symptoms overall will improve.  I will continue present dose of Demadex for now.  3 nonischemic cardiomyopathy-patient's blood pressure will not tolerate Entresto, ARB or hydralazine/nitrates.  We will add low-dose metoprolol to help with SVT and transition back to carvedilol later if blood pressure allows.  4 amyloid heart disease-continue tafamidis.  5 paroxysmal atrial fibrillation-as outlined above we are discontinuing Tikosyn.  After 24-hour washout we will initiate IV amiodarone both for his SVT and paroxysmal atrial fibrillation.  Continue apixaban.  6 chronic stage III kidney disease-we will follow renal function while here.  His creatinine is mildly increased compared to baseline which may be related to SVT with decreased perfusion.  I discussed the patient with Dr. Rayann Heman and also talked with his wife on the  phone.  CRITICAL CARE Performed by: Kirk Ruths   Total critical care time: 60 minutes  Critical care time was exclusive of separately billable procedures and treating other patients.  Critical care was necessary to treat or prevent imminent or life-threatening deterioration.  Critical care was time spent personally by me on the following activities: development of treatment plan with patient and/or surrogate as well as nursing, discussions with consultants, evaluation of patient's response to treatment, examination of patient, obtaining history from patient or surrogate, ordering and performing treatments and interventions, ordering and review of laboratory studies, ordering and review of radiographic studies, pulse oximetry and re-evaluation of patient's condition.   Kirk Ruths, MD

## 2019-08-03 NOTE — Plan of Care (Signed)
Christopher Benton, is a 79 y.o. male, DOB - [birth year], MRN:[id number]  79 year old male with history of amyloidosis, chronic systolic heart failure EF 35%, CKD 3 baseline creatinine around 1.7, atrial flutter on Eliquis who presented to his PCPs office for fatigue, was found to have fast and irregular heartbeat and was sent to the ER for evaluation.  I was requested to admit the patient by the ED physician.  While evaluating the patient the cardiology team informed me that they will admit the patient he is well-known to them.  Patient is in bed, appears to be in no distress but appears weak, he is undergoing Valsalva maneuver as we speak.  Denies any chest pain or shortness of breath but does have exertional fatigue.  His Covid testing is still pending, he clearly has AKI on his lab work.  He has received a liter of IV fluid bolus.  He also complains of constipation and having no bowel movements in several days.  At this time I will defer the management to cardiology, prior to the arrival I took the liberty of placing him on bowel regimen.  Will defer further management to cardiology.  Please note that his Covid test is pending.  Please call us with if you need any assistance.  Vitals:   08/03/19 1445 08/03/19 1515 08/03/19 1530 08/03/19 1545  BP: 99/76 94/75 99/65  99/72  Pulse: (!) 139 (!) 139 (!) 139 (!) 137  Resp: (!) 23 (!) 23 (!) 22 (!) 23  Temp:      SpO2: 94% 96% 95% 97%  Weight:      Height:            Data Review   Micro Results No results found for this or any previous visit (from the past 240 hour(s)).  Radiology Reports DG Chest Portable 1 View  Result Date: 08/03/2019 CLINICAL DATA:  Pt came from his 62 office for a-flutter. Per EMS patient has been feeling unwell since Saturday with decreased appetite, increased generalized weakness. Pt also reports he has not had a bowel movement  since last Thursday. Hx HTN. weakness EXAM: PORTABLE CHEST 1 VIEW COMPARISON:  05/19/2019 FINDINGS: Normal cardiac silhouette. There is bilateral pleural effusions which are moderate volume. Upper lungs clear. No pneumothorax. IMPRESSION: Low lung volumes and bilateral moderate pleural effusions. Electronically Signed   By: Suzy Bouchard M.D.   On: 08/03/2019 14:29    CBC Recent Labs  Lab 08/03/19 1415  WBC 10.3  HGB 16.9  HCT 53.3*  PLT 305  MCV 88.2  MCH 28.0  MCHC 31.7  RDW 15.3  LYMPHSABS 2.2  MONOABS 1.2*  EOSABS 0.2  BASOSABS 0.1    Chemistries  Recent Labs  Lab 08/03/19 1415  NA 131*  K 5.2*  CL 91*  CO2 23  GLUCOSE 119*  BUN 57*  CREATININE 2.03*  CALCIUM 9.0   ------------------------------------------------------------------------------------------------------------------ estimated creatinine clearance is 35.6 mL/min (A) (by C-G formula based on SCr of 2.03 mg/dL (H)). ------------------------------------------------------------------------------------------------------------------ No results for input(s): HGBA1C in the last 72 hours. ------------------------------------------------------------------------------------------------------------------ No results for input(s): CHOL, HDL, LDLCALC, TRIG, CHOLHDL, LDLDIRECT in the last 72 hours. ------------------------------------------------------------------------------------------------------------------ No results for input(s): TSH, T4TOTAL, T3FREE, THYROIDAB in the last 72 hours.  Invalid input(s): FREET3 ------------------------------------------------------------------------------------------------------------------ No results for input(s): VITAMINB12, FOLATE, FERRITIN, TIBC, IRON, RETICCTPCT in the last 72 hours.  Coagulation profile No results for input(s): INR, PROTIME in the last 168 hours.  No results for input(s): DDIMER in the last 72 hours.  Cardiac Enzymes No results for input(s): CKMB,  TROPONINI, MYOGLOBIN in the last 168 hours.  Invalid input(s): CK ------------------------------------------------------------------------------------------------------------------ Invalid input(s): POCBNP   Signature  Lala Lund M.D on 08/03/2019 at 4:17 PM   -  To page go to [email or web address]

## 2019-08-03 NOTE — ED Notes (Signed)
Pt 91% on room air. Placed on 2L via [place]. Pt 94% on 2L via [place].

## 2019-08-04 ENCOUNTER — Inpatient Hospital Stay (HOSPITAL_COMMUNITY): Payer: No Typology Code available for payment source

## 2019-08-04 LAB — BASIC METABOLIC PANEL
Anion gap: 17 — ABNORMAL HIGH (ref 5–15)
BUN: 54 mg/dL — ABNORMAL HIGH (ref 8–23)
CO2: 25 mmol/L (ref 22–32)
Calcium: 9.4 mg/dL (ref 8.9–10.3)
Chloride: 93 mmol/L — ABNORMAL LOW (ref 98–111)
Creatinine, Ser: 1.74 mg/dL — ABNORMAL HIGH (ref 0.61–1.24)
GFR calc Af Amer: 43 mL/min — ABNORMAL LOW (ref >60)
GFR calc non Af Amer: 37 mL/min — ABNORMAL LOW (ref >60)
Glucose, Bld: 104 mg/dL — ABNORMAL HIGH (ref 70–99)
Potassium: 3.5 mmol/L (ref 3.5–5.1)
Sodium: 135 mmol/L (ref 135–145)

## 2019-08-04 LAB — BLOOD GAS, ARTERIAL
Acid-base deficit: 0.8 mmol/L (ref 0.0–2.0)
Bicarbonate: 23 mmol/L (ref 20.0–28.0)
FIO2: 44
O2 Saturation: 86 %
Patient temperature: 37
pCO2 arterial: 36.1 mmHg (ref 32.0–48.0)
pH, Arterial: 7.422 (ref 7.350–7.450)
pO2, Arterial: 56.2 mmHg — ABNORMAL LOW (ref 83.0–108.0)

## 2019-08-04 MED ORDER — PROMETHAZINE HCL 12.5 MG RE SUPP
12.5000 mg | Freq: Once | RECTAL | Status: DC | PRN
  Filled 2019-08-04 (×3): qty 1

## 2019-08-04 MED ORDER — SODIUM CHLORIDE 0.9% FLUSH: 10.0000 mL | INTRAVENOUS | Status: DC | PRN

## 2019-08-04 MED ORDER — AMIODARONE LOAD VIA INFUSION
150.0000 mg | Freq: Once | INTRAVENOUS | Status: AC
  Administered 2019-08-04: 150 mg via INTRAVENOUS
  Filled 2019-08-04: qty 83.34

## 2019-08-04 MED ORDER — AMIODARONE HCL IN DEXTROSE 360-4.14 MG/200ML-% IV SOLN
60.0000 mg/h | INTRAVENOUS | Status: AC
  Administered 2019-08-04 (×2): 60 mg/h via INTRAVENOUS

## 2019-08-04 MED ORDER — FUROSEMIDE 10 MG/ML IJ SOLN
60.0000 mg | Freq: Once | INTRAMUSCULAR | Status: AC
  Administered 2019-08-04: 60 mg via INTRAVENOUS
  Filled 2019-08-04: qty 6

## 2019-08-04 MED ORDER — AMIODARONE HCL IN DEXTROSE 360-4.14 MG/200ML-% IV SOLN
60.0000 mg/h | INTRAVENOUS | Status: DC
  Filled 2019-08-04: qty 200

## 2019-08-04 MED ORDER — PROMETHAZINE HCL 12.5 MG RE SUPP
12.5000 mg | Freq: Three times a day (TID) | RECTAL | Status: DC | PRN
  Filled 2019-08-04: qty 1

## 2019-08-04 MED ORDER — POTASSIUM CHLORIDE CRYS ER 20 MEQ PO TBCR
40.0000 meq | EXTENDED_RELEASE_TABLET | Freq: Once | ORAL | Status: AC
  Administered 2019-08-04: 40 meq via ORAL
  Filled 2019-08-04: qty 2

## 2019-08-04 MED ORDER — TAFAMIDIS 61 MG PO CAPS
61.0000 mg | ORAL_CAPSULE | Freq: Every day | ORAL | Status: DC
  Administered 2019-08-04 – 2019-08-13 (×9): 61 mg via ORAL
  Filled 2019-08-04 (×7): qty 1

## 2019-08-04 MED ORDER — AMIODARONE LOAD VIA INFUSION
150.0000 mg | Freq: Once | INTRAVENOUS | Status: DC
  Filled 2019-08-04: qty 83.34

## 2019-08-04 MED ORDER — FUROSEMIDE 10 MG/ML IJ SOLN
120.0000 mg | Freq: Two times a day (BID) | INTRAVENOUS | Status: DC
  Administered 2019-08-04 – 2019-08-05 (×2): 120 mg via INTRAVENOUS
  Filled 2019-08-04: qty 10
  Filled 2019-08-04: qty 2
  Filled 2019-08-04 (×2): qty 12

## 2019-08-04 MED ORDER — AMIODARONE HCL IN DEXTROSE 360-4.14 MG/200ML-% IV SOLN
30.0000 mg/h | INTRAVENOUS | Status: DC
  Filled 2019-08-04: qty 200

## 2019-08-04 MED ORDER — AMIODARONE HCL IN DEXTROSE 360-4.14 MG/200ML-% IV SOLN
30.0000 mg/h | INTRAVENOUS | Status: DC
  Administered 2019-08-04 – 2019-08-12 (×16): 30 mg/h via INTRAVENOUS
  Filled 2019-08-04 (×15): qty 200

## 2019-08-04 MED ORDER — SODIUM CHLORIDE 0.9% FLUSH
10.0000 mL | Freq: Two times a day (BID) | INTRAVENOUS | Status: DC
  Administered 2019-08-04 – 2019-08-08 (×10): 10 mL
  Administered 2019-08-09: 20 mL
  Administered 2019-08-11 – 2019-08-14 (×5): 10 mL

## 2019-08-04 NOTE — Progress Notes (Signed)
Patient has been tachycardic between 120-140's. Dr Stanford Breed is aware of soft BP and HR.

## 2019-08-04 NOTE — Progress Notes (Signed)
Progress Note  Patient Name: Christopher Benton Date of Encounter: 08/04/2019  Primary Cardiologist: Kirk Ruths, MD   Subjective   Fatigued; DOE; no CP  Inpatient Medications    Scheduled Meds: . apixaban  5 mg Oral BID  . docusate sodium  200 mg Oral BID  . latanoprost  1 drop Both Eyes QHS  . metoprolol tartrate  12.5 mg Oral BID  . polyethylene glycol  17 g Oral BID  . sodium chloride flush  3 mL Intravenous Q12H  . Tafamidis  61 mg Oral QPC breakfast  . torsemide  40 mg Oral BID   Continuous Infusions: . sodium chloride     PRN Meds: sodium chloride, acetaminophen, ondansetron (ZOFRAN) IV, sodium chloride flush   Vital Signs    Vitals:   08/04/19 0724 08/04/19 0725 08/04/19 0727 08/04/19 0736  BP: (!) 88/73 (!) 88/73 99/83 98/86   Pulse: (!) 139 (!) 139 (!) 139 (!) 139  Resp:   (!) 21   Temp:  97.8 F (36.6 C) 97.8 F (36.6 C)   TempSrc:  Oral Oral   SpO2: 100% 100% 99% 95%  Weight:      Height:        Intake/Output Summary (Last 24 hours) at 08/04/2019 0753 Last data filed at 08/04/2019 0739 Gross per 24 hour  Intake 120 ml  Output 1075 ml  Net -955 ml   Last 3 Weights 08/04/2019 08/03/2019 07/12/2019  Weight (lbs) 209 lb 6.4 oz 210 lb 215 lb  Weight (kg) 94.983 kg 95.255 kg 97.523 kg      Telemetry    SVT - Personally Reviewed   Physical Exam   GEN: No acute distress.   Neck: Positive JVD Cardiac: Regular and tachycardic Respiratory: Diminished BS bases GI: Soft, nontender, non-distended  MS: 1+ edema Neuro:  Nonfocal  Psych: Normal affect   Labs     Chemistry Recent Labs  Lab 08/03/19 1415 08/04/19 0434  NA 131* 135  K 5.2* 3.5  CL 91* 93*  CO2 23 25  GLUCOSE 119* 104*  BUN 57* 54*  CREATININE 2.03* 1.74*  CALCIUM 9.0 9.4  GFRNONAA 30* 37*  GFRAA 35* 43*  ANIONGAP 17* 17*     Hematology Recent Labs  Lab 08/03/19 1415  WBC 10.3  RBC 6.04*  HGB 16.9  HCT 53.3*  MCV 88.2  MCH 28.0  MCHC 31.7  RDW 15.3  PLT 305    BNP Recent Labs  Lab 08/03/19 1400  BNP 901.9*     DDimer  Recent Labs  Lab 08/03/19 1810  DDIMER 5.43*     Radiology    DG Chest Portable 1 View  Result Date: 08/03/2019 CLINICAL DATA:  Pt came from his 48 office for a-flutter. Per EMS patient has been feeling unwell since Saturday with decreased appetite, increased generalized weakness. Pt also reports he has not had a bowel movement since last Thursday. Hx HTN. weakness EXAM: PORTABLE CHEST 1 VIEW COMPARISON:  05/19/2019 FINDINGS: Normal cardiac silhouette. There is bilateral pleural effusions which are moderate volume. Upper lungs clear. No pneumothorax. IMPRESSION: Low lung volumes and bilateral moderate pleural effusions. Electronically Signed   By: Suzy Bouchard M.D.   On: 08/03/2019 14:29    Patient Profile     79 year old male with past medical history of amyloid heart disease, paroxysmal atrial fibrillation/flutter, nonischemic cardiomyopathy, chronic stage III kidney disease, hypertension with supraventricular tachycardia and acute on chronic systolic congestive heart failure.  Most recent echocardiogram July 2020  showed ejection fraction 35 to 40%, moderate left ventricular hypertrophy, grade 2 diastolic dysfunction, biatrial enlargement.  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  Patient has been treated with tafamidis.    Assessment & Plan    1 incessant supraventricular tachycardia-heart rate remains 140 today.  I again performed carotid massage at the bedside and patient will convert to sinus for 1-2 beats and then SVT recurs. I think this is the likely reason for his increased fatigue, dyspnea and decreased appetite.  I reviewed management with Dr. Rayann Heman yesterday afternoon and Dr. Curt Bears this morning.  Options for antiarrhythmic are limited.  Tikosyn has been discontinued and he has had none over the past 24 hours.  I will begin IV amiodarone with the hopes that this will convert  to sinus rhythm.  I will continue low-dose metoprolol.  I cannot advance as his systolic blood pressure is in the 90s.  If the above measures are unsuccessful he will need consideration of SVT ablation.  If he does not convert soon I will ask electrophysiology to consult formally.  2 acute on chronic systolic congestive heart failure-I think most of patient's symptoms are related to his SVT. Hopefully if we reestablish sinus rhythm his symptoms overall will improve. Continue home dose of Demadex.  Follow renal function.  Patient's blood pressure has been borderline.  I would not hydrate for this given his baseline congestive heart failure.  3 nonischemic cardiomyopathy-patient's blood pressure will not tolerate Entresto, ARB or hydralazine/nitrates.  Continue low-dose metoprolol for SVT.  Can transition to low-dose carvedilol later once sinus reestablished.  Will repeat echo once sinus has been reestablished.  4 amyloid heart disease-continue tafamidis.  5 paroxysmal atrial fibrillation-continue apixaban.  As outlined above we are transitioning to amiodarone long-term.  6 chronic stage III kidney disease-follow renal function while in-house.  For questions or updates, please contact [place] Please consult [email or web address] for contact info under        Signed, Kirk Ruths, MD  08/04/2019, 7:53 AM

## 2019-08-04 NOTE — Progress Notes (Signed)
Christopher Benton lab to make aware of ABG that was sent.

## 2019-08-04 NOTE — Progress Notes (Signed)
Pt continues to have elevated heart rate and soft BP.  Pt received 1L of fluid for BP.  Pt has continued to be monitored with frequent VS per protocol.  RN has notified MD per Amion, concerning Mews score 4.  RN spoke with Dr. Sonia Side  Via phone.  MD aware of heart rate and is waiting for 24hr washout of Tikysn, before starting Amiradone.  Pt continues to be SOB with exertion.

## 2019-08-04 NOTE — Consult Note (Addendum)
Cardiology Consultation:   Patient ID: XAI FRERKING MRN: [id number]; DOB: 1941/01/05  Admit date: 08/03/2019 Date of Consult: 08/04/2019  Primary Care Provider: Default, Provider, MD Primary Cardiologist: Kirk Ruths, MD  Primary Electrophysiologist:  Dr. Lovena Le    Patient Profile:   Christopher Benton is a 79 y.o. 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 Stanford Breed.  History of Present Illness:   Christopher Benton in October 2019 had an echo that showed ejection fraction 45 to 50%; severe left ventricular hypertrophy and amyloid should be considered; moderate diastolic dysfunction; mildly dilated ascending aorta, biatrial enlargement and mild tricuspid regurgitation. CTA October 2019 showed large right pleural effusion. 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  TTE 11/2018 noted LVEF 35-40%, The cavity size was normal. There is moderately increased left ventricular wall thickness RA severely enlarged, LA moderately dilated He saw Dr. Stanford Breed a couple weeks ago with symptoms and signs of volume OL and his medicines adjusted.  He was admitted to [facility] yesterday with ongoing fatigue, again more SOB, cough.  His wife reported compliance with his medicines as well as the instructions for the adjustment in his diuretics including the 3 days of extra metolazone. [facility] and [facility], carotid sinus massage broke the tachycardia (Noted not to be flutter) though had quick return to SVT in the ER He was admitted, his home tikosyn stopped with plans to start amiodarone today. CXR with b/l effusion  He was started on amiodarone gtt today though observed to have developed  Mobitz one and EP was asked to weigh in, given his known amyloid, with concerns of possible underlying conduction system disease. SBP 90's limiting rate controlling meds (has been on midodrine historically) IV diuretics on board  LABS K+ 5.2 >> 3.5 Mag 2.4 BUN/Creat 57/2.03 >> 54/1.74 WBC 10.3 H/H 15/53 Plts 305  BNP 901 Ddimer 5.43 (0.00-0.50)  The patient this afternoonapparently became transiently lethargic, weak after taking some meds, he was evaluated by Dr. Stanford Breed, with impoved symptoms, ABG PO256, d/c demadex to lasix, his BB (lopressor 12.5mg  got a dose this AM) was stopped with Mobitz one on telemetry, amio continued with bursts of SVTs ongoing Noted his Ddimer elevated though chronically a/c with Eliquis and w/u not pursued  amio is due to be reduced to 30mg /hr now   He denies any CP or real cardiac awareness, but remains very fatigued, "washed out".  Some DOE and SOB remains He has been intermittently quite nauseous, retching.  Heart Pathway Score:     Past Medical History:  Diagnosis Date  . Arthritis    "left foot" (06/01/2018)  . Atrial fibrillation ([place])   . CAD (coronary artery disease)   . Cardiomyopathy ([place])   . Cellulitis and abscess of left leg 02/08/2019  . Chronic systolic CHF (congestive heart failure) ([place]) 04/12/2016  . Dyspnea   . Dysrhythmia Atrial flutter  . 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 Hypertension     Past Surgical History:  Procedure Laterality Date  . CARDIOVERSION N/A 09/04/2015   Procedure: CARDIOVERSION;  Surgeon: Skeet Latch, MD;  Location: [place];  Service: Cardiovascular;  Laterality: N/A;  .  IR THORACENTESIS ASP PLEURAL SPACE W/IMG GUIDE  06/02/2018  . TEE WITHOUT CARDIOVERSION N/A 09/04/2015   Procedure: TRANSESOPHAGEAL ECHOCARDIOGRAM (TEE);  Surgeon: Skeet Latch, MD;  Location: [place];  Service: Cardiovascular;  Laterality: N/A;  . TEE WITHOUT CARDIOVERSION N/A  04/15/2016   Procedure: TRANSESOPHAGEAL ECHOCARDIOGRAM (TEE);  Surgeon: Jerline Pain, MD;  Location: [facility] ENDOSCOPY;  Service: Cardiovascular;  Laterality: N/A;  . TOE AMPUTATION Right    4th digit     Home Medications:  Prior to Admission medications   Medication Sig Start Date End Date Taking? Authorizing Provider  apixaban (ELIQUIS) 5 MG TABS tablet Take 1 tablet (5 mg total) by mouth 2 (two) times daily. 08/28/15  Yes Lelon Perla, MD  dofetilide (TIKOSYN) 125 MCG capsule Take 1 capsule (125 mcg total) by mouth 2 (two) times daily. 06/07/18  Yes Shirley Friar, PA-C  dorzolamide (TRUSOPT) 2 % ophthalmic solution Place 1 drop into both eyes 2 (two) times daily.   Yes [provider]  finasteride (PROSCAR) 5 MG tablet Take 5 mg by mouth daily.   Yes [provider]  latanoprost (XALATAN) 0.005 % ophthalmic solution Place 1 drop into both eyes at bedtime.   Yes [provider]  metolazone (ZAROXOLYN) 2.5 MG tablet Take 1 tablet (2.5 mg total) by mouth as directed. By HF clinic for swelling Patient taking differently: Take 2.5 mg by mouth daily as needed. By HF clinic for swelling 04/13/19  Yes Bensimhon, Shaune Pascal, MD  midodrine (PROAMATINE) 5 MG tablet Take 1 tablet (5 mg total) by mouth 3 (three) times daily with meals. Patient taking differently: Take 5 mg by mouth daily as needed.  04/06/19  Yes Bensimhon, Shaune Pascal, MD  Multiple Vitamins-Minerals (ONE-A-DAY MENS 50+ ADVANTAGE) TABS Take 1 tablet by mouth daily with breakfast.   Yes [provider]  Polyvinyl Alcohol (LIQUID TEARS OP) Place 1-2 drops into both eyes as needed (for dry eyes).   Yes [provider]  potassium chloride SA (K-DUR) 20 MEQ tablet Take 2 tablets (40 mEq total) by mouth every morning AND 1 tablet (20 mEq total) every evening. Patient taking differently: take one tablet by mouth twice daily 01/30/19  Yes Bensimhon, Shaune Pascal, MD  Tafamidis 61 MG CAPS Take 61 mg by  mouth daily after breakfast.    Yes [provider]  torsemide (DEMADEX) 20 MG tablet Take 2 tablets (40 mg total) by mouth 2 (two) times daily. 07/12/19 10/10/19 Yes Lelon Perla, MD  AMBULATORY NON [place] chair 06/30/19   Narda Amber K, DO  HYDROcodone-acetaminophen (NORCO/VICODIN) 5-325 MG tablet Take 1 tablet by mouth every 4 (four) hours as needed. Patient not taking: Reported on 08/03/2019 05/19/19   Isla Pence, MD  traMADol (ULTRAM) 50 MG tablet Take 1 tablet (50 mg total) by mouth 2 (two) times daily as needed for severe pain. Patient not taking: Reported on 08/03/2019 05/25/19   Bensimhon, Shaune Pascal, MD    Inpatient Medications: Scheduled Meds: . apixaban  5 mg Oral BID  . docusate sodium  200 mg Oral BID  . latanoprost  1 drop Both Eyes QHS  . polyethylene glycol  17 g Oral BID  . sodium chloride flush  3 mL Intravenous Q12H  . Tafamidis  61 mg Oral QPC breakfast   Continuous Infusions: . sodium chloride    . amiodarone 60 mg/hr (08/04/19 1421)   Followed by  . amiodarone    . furosemide  PRN Meds: sodium chloride, acetaminophen, ondansetron (ZOFRAN) IV, sodium chloride flush  Allergies:    Allergies  Allergen Reactions  . Pradaxa [Dabigatran Etexilate Mesylate] Nausea Only  . Penicillins Rash    Did it involve swelling of the face/tongue/throat, SOB, or low BP? No Did it involve sudden or severe rash/hives, skin peeling, or any reaction on the inside of your mouth or nose? No Did you need to seek medical attention at a hospital or doctor's office? Unk When did it last happen? "it happened many years ago" If all above answers are "NO", may proceed with cephalosporin use.     Social History:   Social History   Socioeconomic History  . Marital status: Married    Spouse name: Not on file  . Number of children: 2  . Years of education: Not on file  . Highest education level: Not on file  Occupational History  . Not on file    Tobacco Use  . Smoking status: Former Smoker    Packs/day: 0.12    Types: Cigarettes    Quit date: 1990    Years since quitting: 31.1  . Smokeless tobacco: Never Used  . Tobacco comment: 06/01/2018 "smoked off and on for 30 years; don't know how many years I actually smoked"  Substance and Sexual Activity  . Alcohol use: Not Currently    Comment: \  . Drug use: Never  . Sexual activity: Not on file  Other Topics Concern  . 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     Social Determinants of Health   Financial Resource Strain:   . Difficulty of Paying Living Expenses: Not on file  Food Insecurity:   . Worried About Charity fundraiser in the Last Year: Not on file  . Ran Out of Food in the Last Year: Not on file  Transportation Needs:   . Lack of Transportation (Medical): Not on file  . Lack of Transportation (Non-Medical): Not on file  Physical Activity:   . Days of Exercise per Week: Not on file  . Minutes of Exercise per Session: Not on file  Stress:   . Feeling of Stress : Not on file  Social Connections:   . Frequency of Communication with Friends and Family: Not on file  . Frequency of Social Gatherings with Friends and Family: Not on file  . Attends Religious Services: Not on file  . Active Member of Clubs or Organizations: Not on file  . Attends Archivist Meetings: Not on file  . Marital Status: Not on file  Intimate Partner Violence:   . Fear of Current or Ex-Partner: Not on file  . Emotionally Abused: Not on file  . Physically Abused: Not on file  . Sexually Abused: Not on file    Family History:   Family History  Problem Relation Age of Onset  . Heart disease Other        No family history     ROS:  Please see the history of present illness.  All other ROS reviewed and negative.     Physical Exam/Data:   Vitals:   08/04/19 0951 08/04/19 1000 08/04/19 1122 08/04/19 1400  BP:  92/77  92/73   Pulse: 83     Resp: (!) 22 19    Temp:   (!) 97.4 F (36.3 C)   TempSrc:   Oral   SpO2:  Weight:      Height:        Intake/Output Summary (Last 24 hours) at 08/04/2019 1440 Last data filed at 08/04/2019 0854 Gross per 24 hour  Intake 720 ml  Output 1075 ml  Net -355 ml   Last 3 Weights 08/04/2019 08/03/2019 07/12/2019  Weight (lbs) 209 lb 6.4 oz 210 lb 215 lb  Weight (kg) 94.983 kg 95.255 kg 97.523 kg     Body mass index is 28.8 kg/m.  General:  Well nourished, ambulating with assistance to the bathroom, looks tired HEENT: normal Lymph: no adenopathy Neck: no JVD Endocrine:  No thryomegaly Vascular: No carotid bruits  Cardiac:  RRR; tachycardic, no murmurs, gallops or rubs Lungs:  Decreased at the bases b/l, no wheezing, rhonchi or rales  Abd: soft, nontender Ext: trace edema Musculoskeletal:  No deformities Skin: warm and dry  Neuro:  no focal abnormalities noted Psych:  Normal affect   EKG:  The EKG was personally reviewed and demonstrates:    #1 SVT 138bpm #2 A rate is 150, is a 3:1 conduction, though difficult to see any PR prolongation with the second conducted beat P wave buried in the T  (he has 1st degree AVblock)  #3 A rate 150-160, conduction is 4:1, difficult, though perhaps has some PR prolongation,  Has 1st degree AVblock 226ms  #4 looks same  07/12/2019 sinus, also has grouped beating in a 3:1 type pattern low voltage P{ wave, makes more difficult, suspect is a slower version of what we are seeing today  05/18/2020 is SR, again grouped beating in a 3:1 type pattern, though no clear P wave with loss of capture, no PR prolongation   Telemetry:  Telemetry was personally reviewed and demonstrates:   AT times is clearly conducting in a 2:1 conduction with A rate 150, V rate 75 At times is Mobitz one, though V rates never slow, still often 100 or so And the SVT 140's     Relevant CV Studies:  Echocardiogram 12/06/2018 1. The left ventricle has  moderately reduced systolic function, with an  ejection fraction of 35-40%. The cavity size was normal. There is  moderately increased left ventricular wall thickness. Left ventricular  diastolic Doppler parameters are consistent  with pseudonormalization.  2. The right ventricle has normal systolic function. The cavity was  normal. There is no increase in right ventricular wall thickness.  3. Left atrial size was moderately dilated.  4. Right atrial size was severely dilated.  5. Mild calcification of the aortic valve. Aortic valve regurgitation was  not assessed by color flow Doppler.  6. The inferior vena cava was dilated in size with <50% respiratory  variability.   Laboratory Data:  High Sensitivity Troponin:  No results for input(s): TROPONINIHS in the last 720 hours.   Chemistry Recent Labs  Lab 08/03/19 1415 08/04/19 0434  NA 131* 135  K 5.2* 3.5  CL 91* 93*  CO2 23 25  GLUCOSE 119* 104*  BUN 57* 54*  CREATININE 2.03* 1.74*  CALCIUM 9.0 9.4  GFRNONAA 30* 37*  GFRAA 35* 43*  ANIONGAP 17* 17*    No results for input(s): PROT, ALBUMIN, AST, ALT, ALKPHOS, BILITOT in the last 168 hours. Hematology Recent Labs  Lab 08/03/19 1415  WBC 10.3  RBC 6.04*  HGB 16.9  HCT 53.3*  MCV 88.2  MCH 28.0  MCHC 31.7  RDW 15.3  PLT 305   BNP Recent Labs  Lab 08/03/19 1400  BNP 901.9*  DDimer  Recent Labs  Lab 08/03/19 1810  DDIMER 5.43*     Radiology/Studies:   DG CHEST PORT 1 VIEW Result Date: 08/04/2019 CLINICAL DATA:  Shortness of breath. Additional history provided by technologist: History of atrial flutter. EXAM: PORTABLE CHEST 1 VIEW COMPARISON:  Chest radiograph 08/03/2019 FINDINGS: The cardiomediastinal silhouette is unchanged. Aortic atherosclerosis. Again demonstrated are small to moderate bilateral pleural effusions with associated bibasilar atelectasis. Consolidation at the lung bases cannot be excluded. No evidence of pneumothorax. No acute bony  abnormality. Overlying cardiac monitoring leads. IMPRESSION: No significant interval change as compared to chest radiograph 08/03/2019. Persistent small to moderate bilateral pleural effusions with bibasilar atelectasis. Consolidation at the lung bases cannot be excluded. Aortic atherosclerosis. Electronically Signed   By: Kellie Simmering DO   On: 08/04/2019 13:30    DG Chest Portable 1 View Result Date: 08/03/2019 CLINICAL DATA:  Pt came from his 59 office for a-flutter. Per EMS patient has been feeling unwell since Saturday with decreased appetite, increased generalized weakness. Pt also reports he has not had a bowel movement since last Thursday. Hx HTN. weakness EXAM: PORTABLE CHEST 1 VIEW COMPARISON:  05/19/2019 FINDINGS: Normal cardiac silhouette. There is bilateral pleural effusions which are moderate volume. Upper lungs clear. No pneumothorax. IMPRESSION: Low lung volumes and bilateral moderate pleural effusions. Electronically Signed   By: Suzy Bouchard M.D.   On: 08/03/2019 14:29   {  Assessment and Plan:   1. SVT     Unclear,  in V1, looks like there is small deflection just after R wave, short RP tachycardia      2. 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 PO2 on his ABG 56, otherwise looked OK  Ref. Range 08/04/2019 12:28  FIO2 Unknown 44.00  pH, Arterial Latest Ref Range: 7.350 - 7.450  7.422  pCO2 arterial Latest Ref Range: 32.0 - 48.0 mmHg 36.1  pO2, Arterial Latest Ref Range: 83.0 - 108.0 mmHg 56.2 (L)  Acid-base deficit Latest Ref Range: 0.0 - 2.0 mmol/L 0.8  Bicarbonate Latest Ref Range: 20.0 - 28.0 mmol/L 23.0  O2 Saturation Latest Units: % 86.0  Patient temperature Unknown 37.0    ? If 2/2 acute heart failure    He is euvolemic by charting, not negative yet    Down one lb ? If PE despite his Eliquis?  His wife reports with certainty he has not missed any doses  He is not febrile, WBC normal   Contnue amiodarone  Dr. Curt Bears Jarrette Dehner see later today      For questions or updates, please contact [facility] Please consult [email or web address] for contact info under     Signed, Baldwin Jamaica, PA-C  08/04/2019 2:40 PM  I have seen and examined this patient with Tommye Standard.  Agree with above, note added to reflect my findings.  On exam, tachycardic, irregular, crackles in the lungs, 1-2+ lower extremity edema.  Patient was admitted to the hospital with what appears to be in SVT and acute on chronic systolic heart failure.  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  He is currently on dofetilide, but this was not controlling his arrhythmia.  Dofetilide was stopped and  he was put on amiodarone.  Prior to amiodarone, carotid sinus massage was performed which terminate his arrhythmia, but he quickly went back into tachycardia.  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  He does have a history of amyloidosis.  He was put on amiodarone and dofetilide was stopped.  With initiation of amiodarone, tachycardia terminated and he went into a sinus tachycardia with Mobitz 1 AV block.  He remains weak and fatigued with low blood pressures.  At this point, would continue his IV amiodarone.  Hopefully this Chasten Blaze help to control his atrial rate.  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  Elcie Pelster M. Ramie Erman MD 08/04/2019 5:20 PM

## 2019-08-04 NOTE — Progress Notes (Signed)
Patient offered assistance to restroom by this RN, but refused, demanding a male staff member assist him. Pt does not want staff to attend him in the restroom. R.Ursuy,PA requests staff remain with patient during his restroom use, staff will continue to insist.

## 2019-08-04 NOTE — TOC Progression Note (Signed)
Transition of Care [facility]) - Progression Note    Patient Details  Name: Christopher Benton MRN: [id number] Date of Birth: [birth year]  [facility] CM/SW Contact  Zenon Mayo, RN Phone Number: 08/04/2019, 3:18 PM  Clinical Narrative:    NCM received call from April with the [facility], she states , member of [place] team.  PCP is Nelda Severe, [place] is Kellie Simmering [phone], [place].         Expected Discharge Plan and Services                                                 Social Determinants of Health (SDOH) Interventions    Readmission Risk Interventions No flowsheet data found.

## 2019-08-04 NOTE — Progress Notes (Addendum)
Called to room pt became severely weak per pt just no energy left.  No chest pain and no SOB.  Was lethargic on my arrival but he woke easily to sternal rub, moves all extremities equal strength.  + facial symmetry.  Answers questions appropriately.     On 6 L  sp02 to 74%  BP 89 systolic but overall not much change from yesterday.   Lung sounds diminished and Heart sounds RRR.  With premature beats,  Wife in room,   Will get ABGs and stat CXR.  ddimer is elevated but pt on eliquis.   He became weak after taking 2 large pills.    Dr. Stanford Breed aware.   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 Kirk Ruths

## 2019-08-05 ENCOUNTER — Encounter (HOSPITAL_COMMUNITY): Payer: Self-pay | Admitting: Cardiology

## 2019-08-05 LAB — URINALYSIS, ROUTINE W REFLEX MICROSCOPIC
Bacteria, UA: NONE SEEN
Bilirubin Urine: NEGATIVE
Glucose, UA: NEGATIVE mg/dL
Hgb urine dipstick: NEGATIVE
Ketones, ur: NEGATIVE mg/dL
Leukocytes,Ua: NEGATIVE
Nitrite: NEGATIVE
Protein, ur: 30 mg/dL — AB
Specific Gravity, Urine: 1.014 (ref 1.005–1.030)
pH: 5 (ref 5.0–8.0)

## 2019-08-05 MED ORDER — MIDODRINE HCL 5 MG PO TABS
5.0000 mg | ORAL_TABLET | Freq: Three times a day (TID) | ORAL | Status: DC
  Administered 2019-08-05 – 2019-08-07 (×8): 5 mg via ORAL
  Filled 2019-08-05 (×8): qty 1

## 2019-08-05 NOTE — Progress Notes (Signed)
Progress Note  Patient Name: Christopher Benton Date of Encounter: 08/05/2019  Primary Cardiologist: Kirk Ruths, MD   Subjective   Remains fatigued; no chest pain; denies dyspnea  Inpatient Medications    Scheduled Meds: . apixaban  5 mg Oral BID  . docusate sodium  200 mg Oral BID  . latanoprost  1 drop Both Eyes QHS  . polyethylene glycol  17 g Oral BID  . sodium chloride flush  10-40 mL Intracatheter Q12H  . sodium chloride flush  3 mL Intravenous Q12H  . Tafamidis  61 mg Oral QPC breakfast   Continuous Infusions: . sodium chloride 30 mL (08/04/19 1844)  . amiodarone 30 mg/hr (08/05/19 0037)  . furosemide 120 mg (08/04/19 1845)   PRN Meds: sodium chloride, acetaminophen, ondansetron (ZOFRAN) IV, promethazine, sodium chloride flush, sodium chloride flush   Vital Signs    Vitals:   08/05/19 0543 08/05/19 0544 08/05/19 0545 08/05/19 0546  BP:   (!) 116/99   Pulse: (!) 127 (!) 125 (!) 127 (!) 127  Resp: (!) 25 16 (!) 23 (!) 23  Temp:      TempSrc:      SpO2: 97% 96% 95% 95%  Weight:      Height:        Intake/Output Summary (Last 24 hours) at 08/05/2019 0744 Last data filed at 08/04/2019 1500 Gross per 24 hour  Intake 943.64 ml  Output --  Net 943.64 ml   Last 3 Weights 08/05/2019 08/04/2019 08/03/2019  Weight (lbs) 211 lb 8 oz 209 lb 6.4 oz 210 lb  Weight (kg) 95.936 kg 94.983 kg 95.255 kg      Telemetry    SVT - Personally Reviewed   Physical Exam   GEN: Chronically ill appearing; No acute distress.   Neck: Positive JVD; supple Cardiac: Regular and tachycardic Respiratory: Diminished BS bases; no wheeze GI: Soft, NT/ND MS: 1+ edema Neuro:  Grossly intact Psych: Normal affect   Labs     Chemistry Recent Labs  Lab 08/03/19 1415 08/04/19 0434  NA 131* 135  K 5.2* 3.5  CL 91* 93*  CO2 23 25  GLUCOSE 119* 104*  BUN 57* 54*  CREATININE 2.03* 1.74*  CALCIUM 9.0 9.4  GFRNONAA 30* 37*  GFRAA 35* 43*  ANIONGAP 17* 17*      Hematology Recent Labs  Lab 08/03/19 1415  WBC 10.3  RBC 6.04*  HGB 16.9  HCT 53.3*  MCV 88.2  MCH 28.0  MCHC 31.7  RDW 15.3  PLT 305    BNP Recent Labs  Lab 08/03/19 1400  BNP 901.9*     DDimer  Recent Labs  Lab 08/03/19 1810  DDIMER 5.43*     Radiology    DG CHEST PORT 1 VIEW  Result Date: 08/04/2019 CLINICAL DATA:  Shortness of breath. Additional history provided by technologist: History of atrial flutter. EXAM: PORTABLE CHEST 1 VIEW COMPARISON:  Chest radiograph 08/03/2019 FINDINGS: The cardiomediastinal silhouette is unchanged. Aortic atherosclerosis. Again demonstrated are small to moderate bilateral pleural effusions with associated bibasilar atelectasis. Consolidation at the lung bases cannot be excluded. No evidence of pneumothorax. No acute bony abnormality. Overlying cardiac monitoring leads. IMPRESSION: No significant interval change as compared to chest radiograph 08/03/2019. Persistent small to moderate bilateral pleural effusions with bibasilar atelectasis. Consolidation at the lung bases cannot be excluded. Aortic atherosclerosis. Electronically Signed   By: Kellie Simmering DO   On: 08/04/2019 13:30   DG Chest Portable 1 View  Result Date:  08/03/2019 CLINICAL DATA:  Pt came from his 34 office for a-flutter. Per EMS patient has been feeling unwell since Saturday with decreased appetite, increased generalized weakness. Pt also reports he has not had a bowel movement since last Thursday. Hx HTN. weakness EXAM: PORTABLE CHEST 1 VIEW COMPARISON:  05/19/2019 FINDINGS: Normal cardiac silhouette. There is bilateral pleural effusions which are moderate volume. Upper lungs clear. No pneumothorax. IMPRESSION: Low lung volumes and bilateral moderate pleural effusions. Electronically Signed   By: Suzy Bouchard M.D.   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  Most recent echocardiogram July 2020 showed ejection fraction 35 to 40%, moderate left ventricular hypertrophy, grade 2 diastolic dysfunction, biatrial enlargement.  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  Patient has been treated with tafamidis.    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  As outlined previously carotid massage converts to sinus transiently but SVT recurs within 1 to 2 seconds.  This is likely contributing to his fatigue, dyspnea and decreased appetite.  Tikosyn has been discontinued and he has now been initiated on IV amiodarone which we will continue.  I discontinued metoprolol yesterday as he was having Mobitz 1 when in sinus rhythm.  Dr. Curt Bears reviewed and agreed with management.  Hopefully patient will maintain sinus rhythm with amiodarone.  Otherwise may need to consider ablation.   2 acute on chronic systolic congestive heart failure-renal function worse today.  Will hold on further diuresis.  I am hopeful that CHF will also improve with restoration of sinus rhythm.   3 nonischemic cardiomyopathy-patient's blood pressure will not tolerate Entresto, ARB or hydralazine/nitrates. Beta-blocker discontinued yesterday due to Mobitz 1 second-degree AV block.  Will repeat echo once sinus has been reestablished.  4 amyloid heart disease-continue tafamidis.  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  5 paroxysmal atrial fibrillation-continue apixaban.  As outlined above we are transitioning to amiodarone long-term.  6 chronic stage III kidney disease-renal function worse.  Hold Lasix.  Follow.  7 history of orthostasis-we will resume midodrine 5 mg p.o. 3 times daily.  For questions or updates,  please contact [place] Please consult [email or web address] for contact info under        Signed, Kirk Ruths, MD  08/05/2019, 7:44 AM

## 2019-08-05 NOTE — Progress Notes (Signed)
Pt has pulse of 40 and HR of 85. Asymptomatic. On call cardiologist paged.

## 2019-08-05 NOTE — Progress Notes (Signed)
Page to Cardiology PA  [place] please call to confirm updated med orders by [facility]

## 2019-08-05 NOTE — Progress Notes (Signed)
Patient awake, alert and oriented for shift report. Pt assisted to restroom by male SN at completion of report for BM. Pt informed urine specimen will be needed today to complete ordered UA.

## 2019-08-06 LAB — BASIC METABOLIC PANEL
Anion gap: 16 — ABNORMAL HIGH (ref 5–15)
Anion gap: 16 — ABNORMAL HIGH (ref 5–15)
BUN: 58 mg/dL — ABNORMAL HIGH (ref 8–23)
BUN: 64 mg/dL — ABNORMAL HIGH (ref 8–23)
CO2: 26 mmol/L (ref 22–32)
CO2: 28 mmol/L (ref 22–32)
Calcium: 9.1 mg/dL (ref 8.9–10.3)
Calcium: 9.3 mg/dL (ref 8.9–10.3)
Chloride: 90 mmol/L — ABNORMAL LOW (ref 98–111)
Chloride: 90 mmol/L — ABNORMAL LOW (ref 98–111)
Creatinine, Ser: 2.75 mg/dL — ABNORMAL HIGH (ref 0.61–1.24)
Creatinine, Ser: 3.33 mg/dL — ABNORMAL HIGH (ref 0.61–1.24)
GFR calc Af Amer: 24 mL/min — ABNORMAL LOW (ref >60)
GFR calc Af Amer: 19 mL/min — ABNORMAL LOW (ref >60)
GFR calc non Af Amer: 21 mL/min — ABNORMAL LOW (ref >60)
GFR calc non Af Amer: 17 mL/min — ABNORMAL LOW (ref >60)
Glucose, Bld: 187 mg/dL — ABNORMAL HIGH (ref 70–99)
Glucose, Bld: 122 mg/dL — ABNORMAL HIGH (ref 70–99)
Potassium: 4.4 mmol/L (ref 3.5–5.1)
Potassium: 4.2 mmol/L (ref 3.5–5.1)
Sodium: 132 mmol/L — ABNORMAL LOW (ref 135–145)
Sodium: 134 mmol/L — ABNORMAL LOW (ref 135–145)

## 2019-08-06 MED ORDER — GUAIFENESIN-DM 100-10 MG/5ML PO SYRP
5.0000 mL | ORAL_SOLUTION | ORAL | Status: DC | PRN
  Administered 2019-08-06 – 2019-08-11 (×6): 5 mL via ORAL
  Filled 2019-08-06 (×6): qty 5

## 2019-08-06 NOTE — Progress Notes (Signed)
Progress Note  Patient Name: Christopher Benton Date of Encounter: 08/06/2019  Primary Cardiologist: Kirk Ruths, MD   Subjective   No CP; mild dyspnea  Inpatient Medications    Scheduled Meds: . apixaban  5 mg Oral BID  . docusate sodium  200 mg Oral BID  . latanoprost  1 drop Both Eyes QHS  . midodrine  5 mg Oral TID WC  . polyethylene glycol  17 g Oral BID  . sodium chloride flush  10-40 mL Intracatheter Q12H  . sodium chloride flush  3 mL Intravenous Q12H  . Tafamidis  61 mg Oral QPC breakfast   Continuous Infusions: . sodium chloride Stopped (08/05/19 0958)  . amiodarone 30 mg/hr (08/05/19 1106)   PRN Meds: sodium chloride, acetaminophen, ondansetron (ZOFRAN) IV, promethazine, sodium chloride flush, sodium chloride flush   Vital Signs    Vitals:   08/06/19 0630 08/06/19 0635 08/06/19 0640 08/06/19 0645  BP:      Pulse: (!) 30 (!) 25 (!) 52 (!) 124  Resp: 19 19 19 17   Temp:      TempSrc:      SpO2: 97% 96% 97% 98%  Weight:      Height:        Intake/Output Summary (Last 24 hours) at 08/06/2019 0711 Last data filed at 08/06/2019 0700 Gross per 24 hour  Intake 1501.32 ml  Output 165 ml  Net 1336.32 ml   Last 3 Weights 08/06/2019 08/05/2019 08/04/2019  Weight (lbs) 204 lb 9.6 oz 211 lb 8 oz 209 lb 6.4 oz  Weight (kg) 92.806 kg 95.936 kg 94.983 kg      Telemetry    Intermittent sinus with bursts of SVT - Personally Reviewed   Physical Exam   GEN: WD Chronically ill appearing; NAD   Neck: supple Cardiac: Regular and tachycardic; no murmur Respiratory: Diminished BS bases GI: Soft, NT/ND MS: 2+ ankle edema Neuro:  No focal findings Psych: Normal affect   Labs     Chemistry Recent Labs  Lab 08/04/19 0434 08/05/19 0711 08/06/19 0442  NA 135 132* 134*  K 3.5 4.4 4.2  CL 93* 90* 90*  CO2 25 26 28   GLUCOSE 104* 187* 122*  BUN 54* 58* 64*  CREATININE 1.74* 2.75* 3.33*  CALCIUM 9.4 9.1 9.3  GFRNONAA 37* 21* 17*  GFRAA 43* 24* 19*  ANIONGAP  17* 16* 16*     Hematology Recent Labs  Lab 08/03/19 1415  WBC 10.3  RBC 6.04*  HGB 16.9  HCT 53.3*  MCV 88.2  MCH 28.0  MCHC 31.7  RDW 15.3  PLT 305    BNP Recent Labs  Lab 08/03/19 1400  BNP 901.9*     DDimer  Recent Labs  Lab 08/03/19 1810  DDIMER 5.43*     Radiology    DG CHEST PORT 1 VIEW  Result Date: 08/04/2019 CLINICAL DATA:  Shortness of breath. Additional history provided by technologist: History of atrial flutter. EXAM: PORTABLE CHEST 1 VIEW COMPARISON:  Chest radiograph 08/03/2019 FINDINGS: The cardiomediastinal silhouette is unchanged. Aortic atherosclerosis. Again demonstrated are small to moderate bilateral pleural effusions with associated bibasilar atelectasis. Consolidation at the lung bases cannot be excluded. No evidence of pneumothorax. No acute bony abnormality. Overlying cardiac monitoring leads. IMPRESSION: No significant interval change as compared to chest radiograph 08/03/2019. Persistent small to moderate bilateral pleural effusions with bibasilar atelectasis. Consolidation at the lung bases cannot be excluded. Aortic atherosclerosis. Electronically Signed   By: Kellie Simmering DO  On: 08/04/2019 13:30    Patient Profile     79 year old male with past medical history of amyloid heart disease, paroxysmal atrial fibrillation/flutter, nonischemic cardiomyopathy, chronic stage III kidney disease, hypertension with supraventricular tachycardia and acute on chronic systolic congestive heart failure.  Most recent echocardiogram July 2020 showed ejection fraction 35 to 40%, moderate left ventricular hypertrophy, grade 2 diastolic dysfunction, biatrial enlargement.  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  Patient has been treated with tafamidis.    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  As outlined previously carotid massage  converts to sinus transiently but SVT recurs within 1 to 2 seconds.  This is likely contributing to his fatigue, dyspnea and decreased appetite.  Tikosyn has been discontinued and he has now been initiated on IV amiodarone which we will continue.  I discontinued metoprolol previously as he was having Mobitz 1 when in sinus rhythm.  Dr. Curt Bears reviewed and agreed with management.  Hopefully patient will maintain sinus rhythm with amiodarone.  Otherwise may need to consider ablation.   2 acute on chronic systolic congestive heart failure-volume status unchanged.  Renal function worse.  We will continue to hold diuretics. I am hopeful that CHF will also improve with restoration of sinus rhythm.   3 nonischemic cardiomyopathy-patient's blood pressure will not tolerate Entresto, ARB or hydralazine/nitrates. Beta-blocker discontinued yesterday due to Mobitz 1 second-degree AV block.  Will repeat echo once he remains in sinus rhythm.  4 amyloid heart disease-continue tafamidis.  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  5 paroxysmal atrial fibrillation-continue apixaban.  As outlined above we are transitioning to amiodarone long-term.  6 acute on chronic stage III kidney disease-renal function is worse today.  We will continue to hold diuretics.  Worsening renal function likely combination of decreased cardiac output related to SVT and diuretics.  7 history of orthostasis-continue midodrine 5 mg p.o. 3 times daily.  Discussed with patient's wife by phone.  For questions or updates, please contact [place] Please consult [email or web address] for contact info under        Signed, Kirk Ruths, MD  08/06/2019, 7:11 AM

## 2019-08-06 NOTE — Plan of Care (Signed)
  Problem: Nutrition: Goal: Adequate nutrition will be maintained Outcome: Not Progressing   

## 2019-08-06 NOTE — Progress Notes (Signed)
Patient coughing up white thick mucous phelgm prn ordered per patient request. He has ambulated back and forth to bathroom with writer finally had bm. He attempted to wean himself off his o2 his sats dropped to 89% his heart rate increased so we placed his o2 back on him due to dypnea. Wife is at bedside asked about kidney function attempted to reinforce what MD talked to her about she states still little confused but understands patient is a little dry we are holding off on duiretics to see if bun/creat levels improve. No further changes noted.

## 2019-08-06 NOTE — Plan of Care (Signed)
  Problem: Activity: Goal: Risk for activity intolerance will decrease Outcome: Progressing   

## 2019-08-06 NOTE — Plan of Care (Signed)
Problem: Education: Goal: Knowledge of General Education information will improve Description: Including pain rating scale, medication(s)/side effects and non-pharmacologic comfort measures 08/06/2019 0924 by Arlina Robes, RN Outcome: Progressing 08/06/2019 0924 by Arlina Robes, RN Outcome: Progressing   Problem: Health Behavior/Discharge Planning: Goal: Ability to manage health-related needs will improve 08/06/2019 0924 by Arlina Robes, RN Outcome: Progressing 08/06/2019 0924 by Arlina Robes, RN Outcome: Progressing   Problem: Clinical Measurements: Goal: Ability to maintain clinical measurements within normal limits will improve 08/06/2019 0924 by Arlina Robes, RN Outcome: Progressing 08/06/2019 0924 by Arlina Robes, RN Outcome: Progressing Goal: Will remain free from infection 08/06/2019 0924 by Arlina Robes, RN Outcome: Progressing 08/06/2019 0924 by Arlina Robes, RN Outcome: Progressing Goal: Diagnostic test results will improve 08/06/2019 0924 by Arlina Robes, RN Outcome: Progressing 08/06/2019 0924 by Arlina Robes, RN Outcome: Progressing Goal: Respiratory complications will improve 08/06/2019 0924 by Arlina Robes, RN Outcome: Progressing 08/06/2019 0924 by Arlina Robes, RN Outcome: Progressing Goal: Cardiovascular complication will be avoided 08/06/2019 0924 by Arlina Robes, RN Outcome: Progressing 08/06/2019 0924 by Arlina Robes, RN Outcome: Progressing   Problem: Activity: Goal: Risk for activity intolerance will decrease 08/06/2019 0924 by Arlina Robes, RN Outcome: Progressing 08/06/2019 0924 by Arlina Robes, RN Outcome: Progressing   Problem: Nutrition: Goal: Adequate nutrition will be maintained 08/06/2019 0924 by Arlina Robes, RN Outcome: Progressing 08/06/2019 0924 by Arlina Robes, RN Outcome: Progressing   Problem: Coping: Goal: Level of anxiety will decrease 08/06/2019  0924 by Arlina Robes, RN Outcome: Progressing 08/06/2019 0924 by Arlina Robes, RN Outcome: Progressing   Problem: Elimination: Goal: Will not experience complications related to bowel motility 08/06/2019 0924 by Arlina Robes, RN Outcome: Progressing 08/06/2019 0924 by Arlina Robes, RN Outcome: Progressing Goal: Will not experience complications related to urinary retention 08/06/2019 0924 by Arlina Robes, RN Outcome: Progressing 08/06/2019 0924 by Arlina Robes, RN Outcome: Progressing   Problem: Pain Managment: Goal: General experience of comfort will improve 08/06/2019 0924 by Arlina Robes, RN Outcome: Progressing 08/06/2019 0924 by Arlina Robes, RN Outcome: Progressing   Problem: Safety: Goal: Ability to remain free from injury will improve 08/06/2019 0924 by Arlina Robes, RN Outcome: Progressing 08/06/2019 0924 by Arlina Robes, RN Outcome: Progressing   Problem: Skin Integrity: Goal: Risk for impaired skin integrity will decrease 08/06/2019 0924 by Arlina Robes, RN Outcome: Progressing 08/06/2019 0924 by Arlina Robes, RN Outcome: Progressing   Problem: Education: Goal: Ability to demonstrate management of disease process will improve 08/06/2019 0924 by Arlina Robes, RN Outcome: Progressing 08/06/2019 0924 by Arlina Robes, RN Outcome: Progressing Goal: Ability to verbalize understanding of medication therapies will improve 08/06/2019 0924 by Arlina Robes, RN Outcome: Progressing 08/06/2019 0924 by Arlina Robes, RN Outcome: Progressing Goal: Individualized Educational Video(s) 08/06/2019 0924 by Arlina Robes, RN Outcome: Progressing 08/06/2019 0924 by Arlina Robes, RN Outcome: Progressing   Problem: Activity: Goal: Capacity to carry out activities will improve 08/06/2019 0924 by Arlina Robes, RN Outcome: Progressing 08/06/2019 0924 by Arlina Robes, RN Outcome: Progressing    Problem: Cardiac: Goal: Ability to achieve and maintain adequate cardiopulmonary perfusion will improve 08/06/2019 0924 by Arlina Robes, RN Outcome: Progressing 08/06/2019 0924 by Arlina Robes, RN Outcome: Progressing   Problem: Education: Goal: Ability to demonstrate management of disease process will improve 08/06/2019 0924 by Wynetta Emery,  Vertis Kelch, RN Outcome: Progressing 08/06/2019 0924 by Arlina Robes, RN Outcome: Progressing Goal: Ability to verbalize understanding of medication therapies will improve 08/06/2019 0924 by Arlina Robes, RN Outcome: Progressing 08/06/2019 0924 by Arlina Robes, RN Outcome: Progressing   Problem: Activity: Goal: Capacity to carry out activities will improve 08/06/2019 0924 by Arlina Robes, RN Outcome: Progressing 08/06/2019 0924 by Arlina Robes, RN Outcome: Progressing   Problem: Cardiac: Goal: Ability to achieve and maintain adequate cardiopulmonary perfusion will improve 08/06/2019 0924 by Arlina Robes, RN Outcome: Progressing 08/06/2019 0924 by Arlina Robes, RN Outcome: Progressing

## 2019-08-06 NOTE — Progress Notes (Signed)
Wife at bedside assisting patient with bath per his request,he was taken off tele shortly for bath then returned back to tele.

## 2019-08-07 ENCOUNTER — Telehealth (HOSPITAL_COMMUNITY): Payer: Self-pay

## 2019-08-07 ENCOUNTER — Inpatient Hospital Stay (HOSPITAL_COMMUNITY): Payer: No Typology Code available for payment source

## 2019-08-07 DIAGNOSIS — N171 Acute kidney failure with acute cortical necrosis: Secondary | ICD-10-CM

## 2019-08-07 DIAGNOSIS — E854 Organ-limited amyloidosis: Secondary | ICD-10-CM

## 2019-08-07 DIAGNOSIS — I43 Cardiomyopathy in diseases classified elsewhere: Secondary | ICD-10-CM

## 2019-08-07 DIAGNOSIS — I5043 Acute on chronic combined systolic (congestive) and diastolic (congestive) heart failure: Secondary | ICD-10-CM

## 2019-08-07 LAB — BASIC METABOLIC PANEL
Anion gap: 16 — ABNORMAL HIGH (ref 5–15)
BUN: 68 mg/dL — ABNORMAL HIGH (ref 8–23)
CO2: 25 mmol/L (ref 22–32)
Calcium: 9.1 mg/dL (ref 8.9–10.3)
Chloride: 91 mmol/L — ABNORMAL LOW (ref 98–111)
Creatinine, Ser: 3.78 mg/dL — ABNORMAL HIGH (ref 0.61–1.24)
GFR calc Af Amer: 17 mL/min — ABNORMAL LOW (ref >60)
GFR calc non Af Amer: 14 mL/min — ABNORMAL LOW (ref >60)
Glucose, Bld: 117 mg/dL — ABNORMAL HIGH (ref 70–99)
Potassium: 4.2 mmol/L (ref 3.5–5.1)
Sodium: 132 mmol/L — ABNORMAL LOW (ref 135–145)

## 2019-08-07 LAB — MAGNESIUM: Magnesium: 2.5 mg/dL — ABNORMAL HIGH (ref 1.7–2.4)

## 2019-08-07 MED ORDER — FUROSEMIDE 10 MG/ML IJ SOLN: 80.0000 mg | Freq: Once | INTRAMUSCULAR | Status: DC

## 2019-08-07 MED ORDER — MIDODRINE HCL 5 MG PO TABS
5.0000 mg | ORAL_TABLET | Freq: Three times a day (TID) | ORAL | Status: DC
  Administered 2019-08-08 – 2019-08-09 (×4): 5 mg via ORAL
  Filled 2019-08-07 (×4): qty 1

## 2019-08-07 NOTE — Plan of Care (Signed)
  Problem: Education: Goal: Knowledge of General Education information will improve Description Including pain rating scale, medication(s)/side effects and non-pharmacologic comfort measures Outcome: Progressing   

## 2019-08-07 NOTE — Progress Notes (Addendum)
Electrophysiology Rounding Note  Patient Name: Christopher Benton Date of Encounter: 08/07/2019  Primary Cardiologist: Kirk Ruths, MD Electrophysiologist: Dr. Lovena Le   Subjective   The patient is doing poorly this am. He reports decreased urine output and decreased appetite over the weekend.  He is dyspneic with any exertion.   Inpatient Medications    Scheduled Meds:  apixaban  5 mg Oral BID   docusate sodium  200 mg Oral BID   latanoprost  1 drop Both Eyes QHS   midodrine  5 mg Oral TID WC   polyethylene glycol  17 g Oral BID   sodium chloride flush  10-40 mL Intracatheter Q12H   sodium chloride flush  3 mL Intravenous Q12H   Tafamidis  61 mg Oral QPC breakfast   Continuous Infusions:  sodium chloride Stopped (08/05/19 0958)   amiodarone 30 mg/hr (08/06/19 2358)   PRN Meds: sodium chloride, acetaminophen, guaiFENesin-dextromethorphan, ondansetron (ZOFRAN) IV, promethazine, sodium chloride flush, sodium chloride flush   Vital Signs    Vitals:   08/07/19 0517 08/07/19 0600 08/07/19 0700 08/07/19 0727  BP: 103/80 105/85 120/89 107/81  Pulse: 67 84 62   Resp: 16 (!) 21 18   Temp:    (!) 97.4 F (36.3 C)  TempSrc:    Oral  SpO2: 98% 96% 100%   Weight:      Height:        Intake/Output Summary (Last 24 hours) at 08/07/2019 0737 Last data filed at 08/07/2019 0600 Gross per 24 hour  Intake 360 ml  Output 175 ml  Net 185 ml   Filed Weights   08/05/19 0154 08/06/19 0330 08/07/19 0456  Weight: 95.9 kg 92.8 kg 95.6 kg    Physical Exam    GEN- The patient is chronically ill and fatigued appearing, alert and oriented x 3 today.   Head- normocephalic, atraumatic Eyes-  Sclera clear, conjunctiva pink Ears- hearing intact Oropharynx- clear Neck- JVP to ear.  Lungs- Diminished throughout. Slightly increased work of breathing Heart- Somewhat irregular rate and rhythm, no murmurs, rubs or gallops GI- soft, NT, ND, + BS Extremities- 2-3+ edema bilaterally, R>L  chronically. Slightly cool to the touch.  Skin- no rash or lesion Psych- Flat but appropriate affect.  Neuro- strength and sensation are intact  Labs    CBC No results for input(s): WBC, NEUTROABS, HGB, HCT, MCV, PLT in the last 72 hours. Basic Metabolic Panel Recent Labs    08/06/19 0442 08/07/19 0435  NA 134* 132*  K 4.2 4.2  CL 90* 91*  CO2 28 25  GLUCOSE 122* 117*  BUN 64* 68*  CREATININE 3.33* 3.78*  CALCIUM 9.3 9.1   Liver Function Tests No results for input(s): AST, ALT, ALKPHOS, BILITOT, PROT, ALBUMIN in the last 72 hours. No results for input(s): LIPASE, AMYLASE in the last 72 hours. Cardiac Enzymes No results for input(s): CKTOTAL, CKMB, CKMBINDEX, TROPONINI in the last 72 hours.   Telemetry    NSR/ST 90-100s, 10 minutes episode of his SVT overnight (personally reviewed)  Radiology    No results found.  Patient Profile     Christopher Benton is a 79 y.o. 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 Stanford Breed.  Assessment & Plan    1. SVT Unclear, appears short RP tachycardia Tikosyn  stopped with failure, and moved to amiodarone.  He remains on IV amiodarone. Started 3/5 with bolus. He will have received just over 2.5 g load at around 1630 today. Will discuss transition to po and disposition with Dr. Lovena Le.   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  Continue to follow closely on amio.   3. Cardiac amyloidosis Continue tafamidis  4. Paroxysmal AF Continue Eliquis for CHA2DS2VASC of at least 5    Now on amiodarone as above.   5. Acute renal failure on CKD III Baseline 1.5-1.7  Creatinine up to 3.78 this  am.  Discussed with HF team and have very little left to offer him. He is already on midodrine, and would be unlikely to tolerated HD.   6. Acute on chronic systolic CHF He is now markedly volume overloaded on exam, but unable to diurese with ARF. He would be unlikely to be a dialysis candidate with HF and hypotension.  Will defer lasix to primary team. This could help his kidney function, but it is just as likely to continue to decline.   7. 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  I had a long discussion with him this morning, that any therapies left to him (I.e. dialysis), he is a poor candidate for due to his heart failure.  He understands and says he is just "so tired".  He agrees to discussion with palliative care.  I have placed consult, and will come back and discuss with his wife when she arrives after 10 am.  For questions or updates, please contact [place] Please consult [email or web address] for contact info under Cardiology/STEMI.  Signed, Shirley Friar, PA-C  08/07/2019, 7:37 AM   EP Attending Patient seen and examined. Agree with the findings as noted above. The patient presents with SVT and worsening CHF. He is thought to be end stage. He has been placed on amiodarone. He has ST/NSR with frequent PVCs and PAC's. His SVT has been improving. I spoke to his wife and explained that the amiodarone would help improve his symptomatic arrhythmias but not change his poor prognosis. We have nothing more to add. I would expect his symptomatic arrhythmias continue to improve. Call us if he has recurrence.   Mikle [email or web address].

## 2019-08-07 NOTE — Telephone Encounter (Signed)
Authorization from [place] has finally been received via fax.  [facility] was originally faxing information to wrong office.   The VA Auth# is: [id number] and is valid for six months from issue date of 06/27/19.

## 2019-08-07 NOTE — Progress Notes (Addendum)
Progress Note  Patient Name: Christopher Benton Date of Encounter: 08/07/2019  Primary Cardiologist: Kirk Ruths, MD   Subjective   Continue to have shortness of breath. No chest pain. Feels weak.  Inpatient Medications    Scheduled Meds: . apixaban  5 mg Oral BID  . docusate sodium  200 mg Oral BID  . latanoprost  1 drop Both Eyes QHS  . midodrine  5 mg Oral TID WC  . polyethylene glycol  17 g Oral BID  . sodium chloride flush  10-40 mL Intracatheter Q12H  . sodium chloride flush  3 mL Intravenous Q12H  . Tafamidis  61 mg Oral QPC breakfast   Continuous Infusions: . sodium chloride Stopped (08/05/19 0958)  . amiodarone 30 mg/hr (08/06/19 2358)   PRN Meds: sodium chloride, acetaminophen, guaiFENesin-dextromethorphan, ondansetron (ZOFRAN) IV, promethazine, sodium chloride flush, sodium chloride flush   Vital Signs    Vitals:   08/07/19 0700 08/07/19 0727 08/07/19 0800 08/07/19 0900  BP: 120/89 107/81 110/89 111/83  Pulse: 62     Resp: 18  18   Temp:  (!) 97.4 F (36.3 C)    TempSrc:  Oral    SpO2: 100%  97%   Weight:      Height:        Intake/Output Summary (Last 24 hours) at 08/07/2019 0956 Last data filed at 08/07/2019 0910 Gross per 24 hour  Intake 480 ml  Output 175 ml  Net 305 ml   Last 3 Weights 08/07/2019 08/06/2019 08/05/2019  Weight (lbs) 210 lb 12.8 oz 204 lb 9.6 oz 211 lb 8 oz  Weight (kg) 95.618 kg 92.806 kg 95.936 kg      Telemetry    Sinus rhythm with 1st degree AV block alternate with PACs - Personally Reviewed  ECG    Sinus rhythm with 1st degree AVB, PACs and PVCs - Personally Reviewed  Physical Exam   GEN: Appears to be very weak.   Neck: No JVD Cardiac: RRR, no murmurs, rubs, or gallops.  Respiratory: anterior exam clear, but posterior exam shows diminished breath sound bilaterally.  GI: Soft, nontender, non-distended  MS: No deformity. 2+ RLE edema, trace LLE edema Neuro:  Nonfocal  Psych: flat   Labs    High Sensitivity  Troponin:  No results for input(s): TROPONINIHS in the last 720 hours.    Chemistry Recent Labs  Lab 08/05/19 0711 08/06/19 0442 08/07/19 0435  NA 132* 134* 132*  K 4.4 4.2 4.2  CL 90* 90* 91*  CO2 26 28 25   GLUCOSE 187* 122* 117*  BUN 58* 64* 68*  CREATININE 2.75* 3.33* 3.78*  CALCIUM 9.1 9.3 9.1  GFRNONAA 21* 17* 14*  GFRAA 24* 19* 17*  ANIONGAP 16* 16* 16*     Hematology Recent Labs  Lab 08/03/19 1415  WBC 10.3  RBC 6.04*  HGB 16.9  HCT 53.3*  MCV 88.2  MCH 28.0  MCHC 31.7  RDW 15.3  PLT 305    BNP Recent Labs  Lab 08/03/19 1400  BNP 901.9*     DDimer  Recent Labs  Lab 08/03/19 1810  DDIMER 5.43*     Radiology    DG CHEST PORT 1 VIEW  Result Date: 08/07/2019 CLINICAL DATA:  Atrial flutter, weakness. EXAM: PORTABLE CHEST 1 VIEW COMPARISON:  08/04/2019 and 03/08/2018. FINDINGS: Trachea is midline. Heart size is grossly stable. Thoracic aorta is calcified. Lungs are low in volume with mild interstitial prominence and indistinctness as well as moderate bilateral pleural  effusions and bibasilar compressive atelectasis. Findings are similar to 08/04/2019. IMPRESSION: Congestive heart failure with bibasilar compressive atelectasis. Difficult to exclude pneumonia. Electronically Signed   By: Lorin Picket M.D.   On: 08/07/2019 08:52    Cardiac Studies   Echo 12/06/2018 1. The left ventricle has moderately reduced systolic function, with an  ejection fraction of 35-40%. The cavity size was normal. There is  moderately increased left ventricular wall thickness. Left ventricular  diastolic Doppler parameters are consistent  with pseudonormalization.  2. The right ventricle has normal systolic function. The cavity was  normal. There is no increase in right ventricular wall thickness.  3. Left atrial size was moderately dilated.  4. Right atrial size was severely dilated.  5. Mild calcification of the aortic valve. Aortic valve regurgitation was  not  assessed by color flow Doppler.  6. The inferior vena cava was dilated in size with <50% respiratory  variability.   Patient Profile     79 y.o. male with h/o afib/aflutter, coronary artery calcification on prior CT, amyloidosis (diagnosed in 2019 via PYP scan), chronic systolic CHF and HTN presented with worsening dyspnea to his PCP and found to be in SVT and send to [facility]. He has chronic LE edema R > L. While in the ED, telemetry shwoed SVT, Dr. Stanford Breed tried carotid massage and valsalva maneuver which broke the rhythm into sius, however it reverted right back to SVT.  Assessment & Plan    1. SVT: post termination, he had Mobitz 1. Tikosyn stopped, transitioned to IV amiodarone. EP is following, but also not much more to offer this patient due to declining health and end stage CHF  2. Acute on chronic systolic and diastolic CHF  - Echo in July 2020 showed EF 35-40%  - demadex was increased to 40mg  BID with PRN metolazone during office visit in Feb 2021. Wife also given the patient 3 days of metolazone prior to arrival.   - CXR showed moderate bilateral pleural effusion  - Initially attempted IV diuresis, diuresis stopped due to worsening renal function which felt to be due to combination of possible cardiac amyloidosis, SVT induced decreased cardiac output and diuresis. Renal function continue to worsen. Will consult palliative care today. Patient asked to discuss his course with his wife as he himself has very poor memory. Will wait to wife to come and have a family meeting about his condition. Currently full code, will discuss DNR/DNI  - at this point, BP unable to tolerate any of the CHF med. Metoprolol was the last one to stop during this admission. On midodrine for BP.  3. 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 H/o PAF on Eliquis  - Tikosyn stopped due to failure. On IV amiodarone. Previous on metoprolol, this was stopped as well given his BP  5. Coronary artery calcification seen on CT  6.  Acute on chronic renal insufficiency: baseline 1.6. Cr on arrival 2.03, then 1.74 --> 2.75 --> 3.33 --> 3.78  For questions or updates, please contact [place] Please consult [email or web address] for contact info under     Signed, Almyra Deforest, [place]  08/07/2019, 9:56 AM    The patient was seen, examined and discussed with Almyra Deforest, PA-C and I agree with the above.     79 y.o. male with h/o paroxysmal afib/aflutter, coronary artery calcification on prior CT, cardiac amyloidosis (diagnosed in 2019 via PYP scan), chronic systolic CHF and HTN presented with worsening dyspnea to his PCP and found to be in SVT  and send to [facility]. 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   Re SVT: the patient cardioverted on Tikosyn, currently on IV amiodarone. Re: CHF, end stage, worsening kidney function with diuresis. He is unable to tolerate CHF meds sec to hypotension requiring alpha agonist. Diuretics are being held today. We will discuss with his wife potential code status change and moving to hospice care.  If he goes, we will switch iv amiodarone to PO.   Ena Dawley, MD 08/07/2019

## 2019-08-07 NOTE — Progress Notes (Signed)
Family members didn't come to the conclusion whether to change code status to DNR/DNI after having a meeting with cardiology this afternoon. Notified a wife about visiting policy.

## 2019-08-08 ENCOUNTER — Encounter: Payer: No Typology Code available for payment source | Admitting: Neurology

## 2019-08-08 DIAGNOSIS — I480 Paroxysmal atrial fibrillation: Secondary | ICD-10-CM

## 2019-08-08 DIAGNOSIS — N184 Chronic kidney disease, stage 4 (severe): Secondary | ICD-10-CM

## 2019-08-08 DIAGNOSIS — R531 Weakness: Secondary | ICD-10-CM

## 2019-08-08 LAB — BASIC METABOLIC PANEL
Anion gap: 16 — ABNORMAL HIGH (ref 5–15)
BUN: 71 mg/dL — ABNORMAL HIGH (ref 8–23)
CO2: 26 mmol/L (ref 22–32)
Calcium: 9.1 mg/dL (ref 8.9–10.3)
Chloride: 91 mmol/L — ABNORMAL LOW (ref 98–111)
Creatinine, Ser: 4.5 mg/dL — ABNORMAL HIGH (ref 0.61–1.24)
GFR calc Af Amer: 13 mL/min — ABNORMAL LOW (ref >60)
GFR calc non Af Amer: 12 mL/min — ABNORMAL LOW (ref >60)
Glucose, Bld: 122 mg/dL — ABNORMAL HIGH (ref 70–99)
Potassium: 4 mmol/L (ref 3.5–5.1)
Sodium: 133 mmol/L — ABNORMAL LOW (ref 135–145)

## 2019-08-08 MED ORDER — PANTOPRAZOLE SODIUM 20 MG PO TBEC
20.0000 mg | DELAYED_RELEASE_TABLET | Freq: Every day | ORAL | Status: DC
  Administered 2019-08-08 – 2019-08-15 (×6): 20 mg via ORAL
  Filled 2019-08-08 (×7): qty 1

## 2019-08-08 MED ORDER — FUROSEMIDE 10 MG/ML IJ SOLN
80.0000 mg | Freq: Two times a day (BID) | INTRAMUSCULAR | Status: DC
  Administered 2019-08-08 – 2019-08-09 (×3): 80 mg via INTRAVENOUS
  Filled 2019-08-08 (×3): qty 8

## 2019-08-08 NOTE — Progress Notes (Signed)
Progress Note  Patient Name: Christopher Benton Date of Encounter: 08/08/2019  Primary Cardiologist: Kirk Ruths, MD   Subjective   He is feeling significantly better today, sitting up in chair having a discussion with his pastor.  Inpatient Medications    Scheduled Meds: . apixaban  5 mg Oral BID  . docusate sodium  200 mg Oral BID  . latanoprost  1 drop Both Eyes QHS  . midodrine  5 mg Oral TID WC  . pantoprazole  20 mg Oral Daily  . polyethylene glycol  17 g Oral BID  . sodium chloride flush  10-40 mL Intracatheter Q12H  . sodium chloride flush  3 mL Intravenous Q12H  . Tafamidis  61 mg Oral QPC breakfast   Continuous Infusions: . sodium chloride Stopped (08/05/19 0958)  . amiodarone 30 mg/hr (08/08/19 0004)   PRN Meds: sodium chloride, acetaminophen, guaiFENesin-dextromethorphan, ondansetron (ZOFRAN) IV, promethazine, sodium chloride flush, sodium chloride flush   Vital Signs    Vitals:   08/08/19 0400 08/08/19 0600 08/08/19 0743 08/08/19 0800  BP: 107/81 (!) 119/96  119/75  Pulse: (!) 31 (!) 115  (!) 121  Resp: 17 18  16   Temp: 97.7 F (36.5 C)  97.8 F (36.6 C)   TempSrc: Oral  Oral   SpO2: 98% 98%  92%  Weight:  95.4 kg    Height:        Intake/Output Summary (Last 24 hours) at 08/08/2019 1042 Last data filed at 08/08/2019 0900 Gross per 24 hour  Intake 1498.59 ml  Output 0 ml  Net 1498.59 ml   Last 3 Weights 08/08/2019 08/07/2019 08/06/2019  Weight (lbs) 210 lb 4.8 oz 210 lb 12.8 oz 204 lb 9.6 oz  Weight (kg) 95.391 kg 95.618 kg 92.806 kg      Telemetry    A-fib with RVR - Personally Reviewed  ECG    No new tracing - Personally Reviewed  Physical Exam   GEN: Appears to be very weak.   Neck: No JVD Cardiac: iRRR, no murmurs, rubs, or gallops.  Respiratory: anterior exam clear, but posterior exam shows diminished breath sound bilaterally.  GI: Soft, nontender, non-distended  MS: No deformity. 2+ RLE edema, trace LLE edema Neuro:  Nonfocal    Psych: flat   Labs    High Sensitivity Troponin:  No results for input(s): TROPONINIHS in the last 720 hours.    Chemistry Recent Labs  Lab 08/06/19 0442 08/07/19 0435 08/08/19 0313  NA 134* 132* 133*  K 4.2 4.2 4.0  CL 90* 91* 91*  CO2 28 25 26   GLUCOSE 122* 117* 122*  BUN 64* 68* 71*  CREATININE 3.33* 3.78* 4.50*  CALCIUM 9.3 9.1 9.1  GFRNONAA 17* 14* 12*  GFRAA 19* 17* 13*  ANIONGAP 16* 16* 16*     Hematology Recent Labs  Lab 08/03/19 1415  WBC 10.3  RBC 6.04*  HGB 16.9  HCT 53.3*  MCV 88.2  MCH 28.0  MCHC 31.7  RDW 15.3  PLT 305    BNP Recent Labs  Lab 08/03/19 1400  BNP 901.9*     DDimer  Recent Labs  Lab 08/03/19 1810  DDIMER 5.43*     Radiology    DG CHEST PORT 1 VIEW  Result Date: 08/07/2019 CLINICAL DATA:  Atrial flutter, weakness. EXAM: PORTABLE CHEST 1 VIEW COMPARISON:  08/04/2019 and 03/08/2018. FINDINGS: Trachea is midline. Heart size is grossly stable. Thoracic aorta is calcified. Lungs are low in volume with mild interstitial prominence  and indistinctness as well as moderate bilateral pleural effusions and bibasilar compressive atelectasis. Findings are similar to 08/04/2019. IMPRESSION: Congestive heart failure with bibasilar compressive atelectasis. Difficult to exclude pneumonia. Electronically Signed   By: Lorin Picket M.D.   On: 08/07/2019 08:52    Cardiac Studies   Echo 12/06/2018 1. The left ventricle has moderately reduced systolic function, with an  ejection fraction of 35-40%. The cavity size was normal. There is  moderately increased left ventricular wall thickness. Left ventricular  diastolic Doppler parameters are consistent  with pseudonormalization.  2. The right ventricle has normal systolic function. The cavity was  normal. There is no increase in right ventricular wall thickness.  3. Left atrial size was moderately dilated.  4. Right atrial size was severely dilated.  5. Mild calcification of the aortic  valve. Aortic valve regurgitation was  not assessed by color flow Doppler.  6. The inferior vena cava was dilated in size with <50% respiratory  variability.   Patient Profile     79 y.o. male with h/o afib/aflutter, coronary artery calcification on prior CT, amyloidosis (diagnosed in 2019 via PYP scan), chronic systolic CHF and HTN presented with worsening dyspnea to his PCP and found to be in SVT and send to [facility]. He has chronic LE edema R > L. While in the ED, telemetry shwoed SVT, Dr. Stanford Breed tried carotid massage and valsalva maneuver which broke the rhythm into sius, however it reverted right back to SVT.  Assessment & Plan    1. SVT/paroxysmal atrial fibrillation: post termination, he had Mobitz 1. Tikosyn stopped, transitioned to IV amiodarone. EP is following, but also not much more to offer this patient due to declining health and end stage CHF. Today he is in atrial fibrillation with RVR, he is being followed by EP, they would like to continue amiodarone.   2. Acute on chronic systolic and diastolic CHF  - Echo in July 2020 showed EF 35-40%  - demadex was increased to 40mg  BID with PRN metolazone during office visit in Feb 2021. Wife also given the patient 3 days of metolazone prior to arrival.   - CXR showed moderate bilateral pleural effusion  - Initially attempted IV diuresis, diuresis stopped due to worsening renal function which felt to be due to combination of possible cardiac amyloidosis, SVT induced decreased cardiac output and diuresis. Renal function continue to worsen.  We consulted palliative care, they are supposed to see him today.  I will restart IV diuresis given increased weight, today 6 pounds about his weight 2 days ago.  He has crackles in bilateral lungs and 2+ edema in both lower extremities.  Family has not decided about CODE STATUS yet.   - at this point, BP unable to tolerate any of the CHF med. Metoprolol was the last one to stop during this admission. On  midodrine for BP.  3. 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 H/o PAF on Eliquis  - Tikosyn stopped due to failure. On IV amiodarone. Previous on metoprolol, this was stopped as well given his BP  5. Coronary artery calcification seen on CT  6. Acute on chronic renal insufficiency: baseline 1.6. Cr on arrival 2.03, then 1.74 --> 2.75 --> 3.33 --> 3.78-->4.5  For questions or updates, please contact [place] Please consult [email or web address] for contact info under     Signed, Ena Dawley, MD  08/08/2019, 10:42 AM

## 2019-08-08 NOTE — Consult Note (Signed)
Consultation Note Date: 08/08/2019   Patient Name: Christopher Benton  DOB: 1941/03/25  MRN: [id number]  Age / Sex: 79 y.o., male  PCP: Default, Provider, MD Referring Physician: Lelon Perla, MD  Reason for Consultation: Establishing goals of care and Psychosocial/spiritual support  HPI/Patient Profile: 79 y.o. 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  Christopher Benton has chronic lower extremity edema.  Christopher Benton has chronic LE edema R > L.  Christopher Benton does see Dr. Stanford Breed with cardiology but his wife tells me that a lot of his health care is delivered through the [place]  Christopher Benton was recently seen in the office by Dr. Stanford Breed on 07/12/2019 at which time Christopher Benton was having dyspnea on exertion, unchanged, and bilateral lower extremity edema. No chest pain or syncope. Recent CT showed ascities and bilateral pleural effusions, right > left. Christopher Benton was felt to be volume overloaded and demadex was increased to 40 mg BID along with continued fluid restriction and low sodium diet.   Cardiology has discussed their concerns that Christopher Benton is now end-stage with his amyloidosis, renal function continues to worsen today creatinine is 4.50.  Unfortunately medical options to prolong quality of life are limited.  Patient and his family face treatment option decisions, advanced directive decisions and anticipatory care needs   . Clinical Assessment and Goals of Care:   This NP Wadie Lessen reviewed medical records, received report from team, assessed the patient and then meet at the patient's bedside along with his wife, 2 daughters and his son to discuss diagnosis, prognosis, GOC, EOL wishes disposition and options.  Concept of Hospice and Palliative Care were discussed  Created space and opportunity for  patient and his family to explore their thoughts and feelings regarding current medical situation.  All express a strong faith and a hope that God will see to his medical improvement and prolonged quality of life.  Christopher Benton is able to share with his family that Christopher Benton does have faith and hopes for more life but that quality of life is most important.  A detailed discussion was had today regarding advanced directives.  Concepts specific to code status, artifical feeding and hydration, continued IV antibiotics and rehospitalization was had.  The difference between a aggressive medical intervention path  and a palliative comfort care path for this patient at this time was had.  Values and goals of care important to patient and family were attempted to be elicited.  MOST form introduced  Natural trajectory and expectations at EOL were discussed.  Questions and concerns addressed.   Family encouraged to call with questions or concerns.    PMT will continue to support holistically.     There is no documented healthcare power of attorney or advanced directive at this time the patient is making his own decisions and his wife would speak for him in the situation that Christopher Benton could not speak for himself.  SUMMARY OF RECOMMENDATIONS    Code Status/Advance Care Planning:  Full code   Encourage patient and family to consider DNR/DNI status knowing poor outcomes in similar patients.. Mr. Bazinet is leaning toward a DNR/DNI however that decision will be made in the morning.   Additional Recommendations (Limitations, Scope, Preferences):  Full Scope Treatment   At this time patient is open to all offered and available medical interventions to prolong life.  Psycho-social/Spiritual:   Desire for further Chaplaincy support:no-patient is supported by his community church  Additional Recommendations: Education on Hospice  Prognosis:   < 3 months  Discharge Planning: To Be Determined       Primary Diagnoses: Present on Admission: . ARF (acute renal failure) ([place]) . Supraventricular arrhythmia   I have reviewed the medical record, interviewed the patient and family, and examined the patient. The following aspects are pertinent.  Past Medical History:  Diagnosis Date  . Arthritis    "left foot" (06/01/2018)  . Atrial fibrillation ([place])   . CAD (coronary artery disease)   . Cardiomyopathy ([place])   . Cellulitis and abscess of left leg 02/08/2019  . Chronic systolic CHF (congestive heart failure) ([place]) 04/12/2016  . Dyspnea   . Dysrhythmia Atrial flutter  . 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 Hypertension    Social History   Socioeconomic History  . Marital status: Married    Spouse name: Not on file  . Number of children: 2  . Years of education: Not on file  . Highest education level: Not on file  Occupational History  . Not on file  Tobacco Use  . Smoking status: Former Smoker    Packs/day: 0.12    Types: Cigarettes    Quit date: 1990    Years since quitting: 31.2  . Smokeless tobacco: Never Used  . Tobacco comment: 06/01/2018 "smoked off and on for 30 years; don't know how many years I actually smoked"  Substance and Sexual Activity  . Alcohol use: Not Currently    Comment: \  . Drug use: Never  . Sexual activity: Not on file  Other Topics Concern  . 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     Social Determinants of Health   Financial Resource Strain:   . Difficulty of Paying Living Expenses: Not on file  Food Insecurity:   . Worried About Charity fundraiser in the Last Year: Not on file  . Ran Out of Food in the Last Year: Not on file  Transportation Needs:   . Lack of Transportation (Medical): Not on file  . Lack of Transportation (Non-Medical): Not on file  Physical Activity:   . Days of Exercise per Week: Not on file  . Minutes of Exercise per  Session: Not on file  Stress:   . Feeling of Stress : Not on file  Social Connections:   . Frequency of Communication with Friends and Family: Not on file  . Frequency of Social Gatherings with Friends and Family: Not on file  . Attends Religious Services: Not on file  . Active Member of Clubs or Organizations: Not on file  . Attends Archivist Meetings: Not on file  . Marital Status: Not on file   Family History  Problem Relation Age of Onset  . Heart disease Other        No family  history   Scheduled Meds: . apixaban  5 mg Oral BID  . docusate sodium  200 mg Oral BID  . latanoprost  1 drop Both Eyes QHS  . midodrine  5 mg Oral TID WC  . polyethylene glycol  17 g Oral BID  . sodium chloride flush  10-40 mL Intracatheter Q12H  . sodium chloride flush  3 mL Intravenous Q12H  . Tafamidis  61 mg Oral QPC breakfast   Continuous Infusions: . sodium chloride Stopped (08/05/19 0958)  . amiodarone 30 mg/hr (08/08/19 0004)   PRN Meds:.sodium chloride, acetaminophen, guaiFENesin-dextromethorphan, ondansetron (ZOFRAN) IV, promethazine, sodium chloride flush, sodium chloride flush Medications Prior to Admission:  Prior to Admission medications   Medication Sig Start Date End Date Taking? Authorizing Provider  apixaban (ELIQUIS) 5 MG TABS tablet Take 1 tablet (5 mg total) by mouth 2 (two) times daily. 08/28/15  Yes Lelon Perla, MD  dofetilide (TIKOSYN) 125 MCG capsule Take 1 capsule (125 mcg total) by mouth 2 (two) times daily. 06/07/18  Yes Shirley Friar, PA-C  dorzolamide (TRUSOPT) 2 % ophthalmic solution Place 1 drop into both eyes 2 (two) times daily.   Yes [provider]  finasteride (PROSCAR) 5 MG tablet Take 5 mg by mouth daily.   Yes [provider]  latanoprost (XALATAN) 0.005 % ophthalmic solution Place 1 drop into both eyes at bedtime.   Yes [provider]  metolazone (ZAROXOLYN) 2.5 MG tablet Take 1 tablet (2.5 mg total) by  mouth as directed. By HF clinic for swelling Patient taking differently: Take 2.5 mg by mouth daily as needed. By HF clinic for swelling 04/13/19  Yes Bensimhon, Shaune Pascal, MD  midodrine (PROAMATINE) 5 MG tablet Take 1 tablet (5 mg total) by mouth 3 (three) times daily with meals. Patient taking differently: Take 5 mg by mouth daily as needed.  04/06/19  Yes Bensimhon, Shaune Pascal, MD  Multiple Vitamins-Minerals (ONE-A-DAY MENS 50+ ADVANTAGE) TABS Take 1 tablet by mouth daily with breakfast.   Yes [provider]  Polyvinyl Alcohol (LIQUID TEARS OP) Place 1-2 drops into both eyes as needed (for dry eyes).   Yes [provider]  potassium chloride SA (K-DUR) 20 MEQ tablet Take 2 tablets (40 mEq total) by mouth every morning AND 1 tablet (20 mEq total) every evening. Patient taking differently: take one tablet by mouth twice daily 01/30/19  Yes Bensimhon, Shaune Pascal, MD  Tafamidis 61 MG CAPS Take 61 mg by mouth daily after breakfast.    Yes [provider]  torsemide (DEMADEX) 20 MG tablet Take 2 tablets (40 mg total) by mouth 2 (two) times daily. 07/12/19 10/10/19 Yes Lelon Perla, MD  AMBULATORY NON [place] chair 06/30/19   Narda Amber K, DO  HYDROcodone-acetaminophen (NORCO/VICODIN) 5-325 MG tablet Take 1 tablet by mouth every 4 (four) hours as needed. Patient not taking: Reported on 08/03/2019 05/19/19   Isla Pence, MD  traMADol (ULTRAM) 50 MG tablet Take 1 tablet (50 mg total) by mouth 2 (two) times daily as needed for severe pain. Patient not taking: Reported on 08/03/2019 05/25/19   Bensimhon, Shaune Pascal, MD   Allergies  Allergen Reactions  . Pradaxa [Dabigatran Etexilate Mesylate] Nausea Only  . Penicillins Rash    Did it involve swelling of the face/tongue/throat, SOB, or low BP? No Did it involve sudden or severe rash/hives, skin peeling, or any reaction on the inside of your mouth or nose? No Did you need to seek medical  attention at a hospital  or doctor's office? Unk When did it last happen? "it happened many years ago" If all above answers are "NO", may proceed with cephalosporin use.    Review of Systems  Constitutional: Positive for fatigue.  Cardiovascular: Positive for leg swelling.  Neurological: Positive for weakness.  All other systems reviewed and are negative.   Physical Exam Constitutional:      Appearance: Christopher Benton is normal weight. Christopher Benton is ill-appearing.  Cardiovascular:     Rate and Rhythm: Tachycardia present.  Skin:    General: Skin is warm and dry.  Neurological:     Mental Status: Christopher Benton is alert.     Vital Signs: BP 119/75 (BP Location: Right Arm)   Pulse (!) 121   Temp 97.8 F (36.6 C) (Oral)   Resp 16   Ht 5' 11.5" (1.816 m)   Wt 95.4 kg   SpO2 92%   BMI 28.92 kg/m  Pain Scale: 0-10   Pain Score: 0-No pain   SpO2: SpO2: 92 % O2 Device:SpO2: 92 % O2 Flow Rate: .O2 Flow Rate (L/min): 3 L/min  IO: Intake/output summary:   Intake/Output Summary (Last 24 hours) at 08/08/2019 1003 Last data filed at 08/08/2019 0900 Gross per 24 hour  Intake 1498.59 ml  Output 0 ml  Net 1498.59 ml    LBM: Last BM Date: 08/08/19 Baseline Weight: Weight: 95.3 kg Most recent weight: Weight: 95.4 kg     Palliative Assessment/Data: 40% past     Time In: 1500 Time Out: 1630 Time Total: 90 minutes Greater than 50%  of this time was spent counseling and coordinating care related to the above assessment and plan.  Signed by: Wadie Lessen, NP   Please contact Palliative Medicine Team phone at [phone] for questions and concerns.  For individual provider: See Shea Evans

## 2019-08-08 NOTE — Progress Notes (Deleted)
Progress Note  Patient Name: Christopher Benton Date of Encounter: 08/08/2019  Primary Cardiologist: Kirk Ruths, MD   Subjective   Patient feels about the same today. His pastor is in the room with him. No chest pain. On 3L O2/ kidney function worsening. Palliative care to see the patient.   Inpatient Medications    Scheduled Meds: . apixaban  5 mg Oral BID  . docusate sodium  200 mg Oral BID  . latanoprost  1 drop Both Eyes QHS  . midodrine  5 mg Oral TID WC  . pantoprazole  20 mg Oral Daily  . polyethylene glycol  17 g Oral BID  . sodium chloride flush  10-40 mL Intracatheter Q12H  . sodium chloride flush  3 mL Intravenous Q12H  . Tafamidis  61 mg Oral QPC breakfast   Continuous Infusions: . sodium chloride Stopped (08/05/19 0958)  . amiodarone 30 mg/hr (08/08/19 0004)   PRN Meds: sodium chloride, acetaminophen, guaiFENesin-dextromethorphan, ondansetron (ZOFRAN) IV, promethazine, sodium chloride flush, sodium chloride flush   Vital Signs    Vitals:   08/08/19 0400 08/08/19 0600 08/08/19 0743 08/08/19 0800  BP: 107/81 (!) 119/96  119/75  Pulse: (!) 31 (!) 115  (!) 121  Resp: 17 18  16   Temp: 97.7 F (36.5 C)  97.8 F (36.6 C)   TempSrc: Oral  Oral   SpO2: 98% 98%  92%  Weight:  95.4 kg    Height:        Intake/Output Summary (Last 24 hours) at 08/08/2019 1010 Last data filed at 08/08/2019 0900 Gross per 24 hour  Intake 1498.59 ml  Output 0 ml  Net 1498.59 ml   Last 3 Weights 08/08/2019 08/07/2019 08/06/2019  Weight (lbs) 210 lb 4.8 oz 210 lb 12.8 oz 204 lb 9.6 oz  Weight (kg) 95.391 kg 95.618 kg 92.806 kg      Telemetry    Afib with labile rates, 100-120s, episode of SVT HR 120-130 - Personally Reviewed  ECG    No new - Personally Reviewed  Physical Exam   GEN: No acute distress.   Neck: + JVD Cardiac: Irreg Irreg, no murmurs, rubs, or gallops.  Respiratory: diminished breath sounds B/L at bases. GI: Soft, nontender, non-distended  MS: 1-2+ edema;  No deformity. Neuro:  Nonfocal  Psych: Normal affect   Labs    High Sensitivity Troponin:  No results for input(s): TROPONINIHS in the last 720 hours.    Chemistry Recent Labs  Lab 08/06/19 0442 08/07/19 0435 08/08/19 0313  NA 134* 132* 133*  K 4.2 4.2 4.0  CL 90* 91* 91*  CO2 28 25 26   GLUCOSE 122* 117* 122*  BUN 64* 68* 71*  CREATININE 3.33* 3.78* 4.50*  CALCIUM 9.3 9.1 9.1  GFRNONAA 17* 14* 12*  GFRAA 19* 17* 13*  ANIONGAP 16* 16* 16*     Hematology Recent Labs  Lab 08/03/19 1415  WBC 10.3  RBC 6.04*  HGB 16.9  HCT 53.3*  MCV 88.2  MCH 28.0  MCHC 31.7  RDW 15.3  PLT 305    BNP Recent Labs  Lab 08/03/19 1400  BNP 901.9*     DDimer  Recent Labs  Lab 08/03/19 1810  DDIMER 5.43*     Radiology    DG CHEST PORT 1 VIEW  Result Date: 08/07/2019 CLINICAL DATA:  Atrial flutter, weakness. EXAM: PORTABLE CHEST 1 VIEW COMPARISON:  08/04/2019 and 03/08/2018. FINDINGS: Trachea is midline. Heart size is grossly stable. Thoracic aorta is calcified.  Lungs are low in volume with mild interstitial prominence and indistinctness as well as moderate bilateral pleural effusions and bibasilar compressive atelectasis. Findings are similar to 08/04/2019. IMPRESSION: Congestive heart failure with bibasilar compressive atelectasis. Difficult to exclude pneumonia. Electronically Signed   By: Lorin Picket M.D.   On: 08/07/2019 08:52    Cardiac Studies   Echo 11/2018 1. The left ventricle has moderately reduced systolic function, with an  ejection fraction of 35-40%. The cavity size was normal. There is  moderately increased left ventricular wall thickness. Left ventricular  diastolic Doppler parameters are consistent  with pseudonormalization.  2. The right ventricle has normal systolic function. The cavity was  normal. There is no increase in right ventricular wall thickness.  3. Left atrial size was moderately dilated.  4. Right atrial size was severely dilated.    5. Mild calcification of the aortic valve. Aortic valve regurgitation was  not assessed by color flow Doppler.  6. The inferior vena cava was dilated in size with <50% respiratory  variability.   Patient Profile     79 y.o. 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 He has chronic LE edema R > L. While in the ED, telemetry shwoed SVT, Dr. Stanford Breed tried carotid massage and valsalva maneuver which broke the rhythm into sinus, however it reverted right back to SVT.  Assessment & Plan    SVT - converted on Tikosyn - Now on IV amio>>will likely transition to PO - EP following>> do not have much more to offer given poor prognosis  Acute on chronic systolic diastolic CHF - Echo in July 2020 with EF 35-40% - Has been seen by Advanced Heart failure clinic in the past. Metolazone was increased previously - On  Admission CXR with moderate B/L pleural effusions - Tried IV diuresis but his was stopped dur to worsening renal function felt to be due to low cardiac output, cardiac amyloidosis, and diuresis - Renal function continued to worsen and palliative care was consulted.  - unable to tolerate CHF meds due to BP. Pressure still soft.  Goals of care - DNR/DNI family discussion yesterday>>unable to decide - Palliative care consulted  Amyloidosis - on tafamidis  Persistent AFib on Eliquis - Tikosyn previously stopped due to failure.  - On IV amio - BB stopped given low pressures  Acute on chronic Renal Insufficiency -creatinine continues to worsen. 3.78>4.50  For questions or updates, please contact [place] Please consult [email or web address] for contact info under     Signed, Cadence Ninfa Meeker, PA-C  08/08/2019, 10:10 AM    The patient was seen, examined and discussed with Cadence Ninfa Meeker, PA-C    and I agree with the above.  Please see my separate note for further instructions.  Ena Dawley, MD

## 2019-08-09 DIAGNOSIS — N185 Chronic kidney disease, stage 5: Secondary | ICD-10-CM

## 2019-08-09 DIAGNOSIS — Z515 Encounter for palliative care: Secondary | ICD-10-CM

## 2019-08-09 DIAGNOSIS — Z7189 Other specified counseling: Secondary | ICD-10-CM

## 2019-08-09 DIAGNOSIS — R531 Weakness: Secondary | ICD-10-CM

## 2019-08-09 LAB — BASIC METABOLIC PANEL
Anion gap: 18 — ABNORMAL HIGH (ref 5–15)
BUN: 78 mg/dL — ABNORMAL HIGH (ref 8–23)
CO2: 23 mmol/L (ref 22–32)
Calcium: 9.2 mg/dL (ref 8.9–10.3)
Chloride: 90 mmol/L — ABNORMAL LOW (ref 98–111)
Creatinine, Ser: 5.76 mg/dL — ABNORMAL HIGH (ref 0.61–1.24)
GFR calc Af Amer: 10 mL/min — ABNORMAL LOW (ref >60)
GFR calc non Af Amer: 9 mL/min — ABNORMAL LOW (ref >60)
Glucose, Bld: 155 mg/dL — ABNORMAL HIGH (ref 70–99)
Potassium: 3.8 mmol/L (ref 3.5–5.1)
Sodium: 131 mmol/L — ABNORMAL LOW (ref 135–145)

## 2019-08-09 MED ORDER — MIDODRINE HCL 5 MG PO TABS
10.0000 mg | ORAL_TABLET | Freq: Three times a day (TID) | ORAL | Status: DC
  Administered 2019-08-09 – 2019-08-11 (×6): 10 mg via ORAL
  Filled 2019-08-09 (×7): qty 2

## 2019-08-09 NOTE — TOC Progression Note (Signed)
Transition of Care [facility]) - Progression Note    Patient Details  Name: Christopher Benton MRN: [id number] Date of Birth: 1941-04-23  [facility] of [facility] CM/SW Contact  Zenon Mayo, RN Phone Number: 08/09/2019, 4:21 PM  Clinical Narrative:    NCM received vm from April with [facility] stating patient is with [facility], [place] is Kellie Simmering [phone] ,  [place].        Expected Discharge Plan and Services                                                 Social Determinants of Health (SDOH) Interventions    Readmission Risk Interventions No flowsheet data found.

## 2019-08-09 NOTE — Progress Notes (Signed)
Progress Note  Patient Name: Christopher Benton Date of Encounter: 08/09/2019  Primary Cardiologist: Kirk Ruths, MD   Subjective   He is feeling tired today, sleeping in a chair.  Inpatient Medications    Scheduled Meds: . apixaban  5 mg Oral BID  . docusate sodium  200 mg Oral BID  . latanoprost  1 drop Both Eyes QHS  . midodrine  5 mg Oral TID WC  . pantoprazole  20 mg Oral Daily  . polyethylene glycol  17 g Oral BID  . sodium chloride flush  10-40 mL Intracatheter Q12H  . sodium chloride flush  3 mL Intravenous Q12H  . Tafamidis  61 mg Oral QPC breakfast   Continuous Infusions: . sodium chloride Stopped (08/05/19 0958)  . amiodarone 30 mg/hr (08/09/19 1138)   PRN Meds: sodium chloride, acetaminophen, guaiFENesin-dextromethorphan, ondansetron (ZOFRAN) IV, promethazine, sodium chloride flush, sodium chloride flush   Vital Signs    Vitals:   08/09/19 0819 08/09/19 0822 08/09/19 0935 08/09/19 1101  BP: 94/78 98/76 94/68  108/64  Pulse: (!) 122 (!) 119 (!) 107 (!) 127  Resp: 19 20 19 19   Temp:      TempSrc:      SpO2: 91% 91% 93% 93%  Weight:      Height:        Intake/Output Summary (Last 24 hours) at 08/09/2019 1221 Last data filed at 08/09/2019 0600 Gross per 24 hour  Intake 758.58 ml  Output 300 ml  Net 458.58 ml   Last 3 Weights 08/08/2019 08/07/2019 08/06/2019  Weight (lbs) 210 lb 4.8 oz 210 lb 12.8 oz 204 lb 9.6 oz  Weight (kg) 95.391 kg 95.618 kg 92.806 kg      Telemetry    A-fib with RVR - Personally Reviewed  ECG    No new tracing - Personally Reviewed  Physical Exam   GEN: Appears to be very weak.   Neck: No JVD Cardiac: iRRR, no murmurs, rubs, or gallops.  Respiratory: anterior exam clear, but posterior exam shows diminished breath sound bilaterally.  GI: Soft, nontender, non-distended  MS: No deformity. 2+ RLE edema, trace LLE edema Neuro:  Nonfocal  Psych: flat   Labs    High Sensitivity Troponin:  No results for input(s):  TROPONINIHS in the last 720 hours.    Chemistry Recent Labs  Lab 08/07/19 0435 08/08/19 0313 08/09/19 0933  NA 132* 133* 131*  K 4.2 4.0 3.8  CL 91* 91* 90*  CO2 25 26 23   GLUCOSE 117* 122* 155*  BUN 68* 71* 78*  CREATININE 3.78* 4.50* 5.76*  CALCIUM 9.1 9.1 9.2  GFRNONAA 14* 12* 9*  GFRAA 17* 13* 10*  ANIONGAP 16* 16* 18*     Hematology Recent Labs  Lab 08/03/19 1415  WBC 10.3  RBC 6.04*  HGB 16.9  HCT 53.3*  MCV 88.2  MCH 28.0  MCHC 31.7  RDW 15.3  PLT 305    BNP Recent Labs  Lab 08/03/19 1400  BNP 901.9*     DDimer  Recent Labs  Lab 08/03/19 1810  DDIMER 5.43*     Radiology    No results found.  Cardiac Studies   Echo 12/06/2018 1. The left ventricle has moderately reduced systolic function, with an  ejection fraction of 35-40%. The cavity size was normal. There is  moderately increased left ventricular wall thickness. Left ventricular  diastolic Doppler parameters are consistent  with pseudonormalization.  2. The right ventricle has normal systolic function. The cavity  was  normal. There is no increase in right ventricular wall thickness.  3. Left atrial size was moderately dilated.  4. Right atrial size was severely dilated.  5. Mild calcification of the aortic valve. Aortic valve regurgitation was  not assessed by color flow Doppler.  6. The inferior vena cava was dilated in size with <50% respiratory  variability.   Patient Profile     79 y.o. 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 He has chronic LE edema R > L. While in the ED, telemetry shwoed SVT, Dr. Stanford Breed tried carotid massage and valsalva maneuver which broke the rhythm into sius, however it reverted right back to SVT.  Assessment & Plan    1. SVT/paroxysmal atrial fibrillation:  post termination, he had [place] 1. Tikosyn stopped, transitioned to IV amiodarone. EP is following, but also not much more to offer this patient due to declining health and end stage CHF. Today he is in atrial fibrillation with RVR, he is being followed by EP, they would like to continue amiodarone.  2. Acute on chronic systolic and diastolic CHF  - Echo in July 2020 showed EF 35-40%  - demadex was increased to 40mg  BID with PRN metolazone during office visit in Feb 2021. Wife also given the patient 3 days of metolazone prior to arrival.   - CXR showed moderate bilateral pleural effusion  -We initially attempted IV diuresis however this will stop due to worsening renal function, he received additional 2 doses as he gained over 6 kg in just 2 days with significant lower extremity edema, however his kidney function continues to deteriorate even without use of Lasix.  Today's creatinine 5.76.  We will call nephrology for input, he might not be a great dialysis candidate given underlying amyloidosis and severe hypotension on Midodrin.   3. 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 H/o PAF on Eliquis  - Tikosyn stopped due to failure. On IV amiodarone. Previous on metoprolol, this was stopped as well given his BP  5. Coronary artery calcification seen on CT  6. Acute on chronic renal insufficiency: baseline 1.6. Cr on arrival 2.03, then 1.74 --> 2.75 --> 3.33 --> 3.78-->4.5->5.76, We will ask nephrology for a consult.  Again we had a long discussion with patient's wife patient, this time I discussed in the presence of PA from the palliative care.  Patient's wife has really hard time coping with the situation and she is very clear about that she is not ready to move forward with changing code to DNR and changing patient to hospice.  However she is now aware of options she has if situation changes, she was provided with numbers to call for help and there are a lot of resources that can be provided for her at home if  the patient became hospice patient.  For now she wants to continue full measures of therapy, no DNR, all the meds, and readmission if needed.  Overall prognosis is very poor with declining kidney function and end-stage heart failure. Was discussed with Dr. Stanford Breed.  For questions or updates, please contact [place] Please consult [email or web address] for contact info under     Signed, Ena Dawley, MD  08/09/2019, 12:21 PM

## 2019-08-09 NOTE — Consult Note (Signed)
[facility] Renal Consultation Note  Requesting MD: Crenshaw Indication for Consultation: A on CRF  HPI:  Christopher Benton is a 79 y.o. 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  He was admitted on 3/4 where he had been complaining of worsening shortness of breath-felt to be volume overloaded, diuretics increased.  Hospitalization has been complicated by SVT and continued A. Fib and hypotension.  He has been started on amiodarone.  Because of his volume overload, diuresis was attempted.  Urine output has not been well recorded but does not appear to be robust.  Interestingly, weight is basically the same from 3/4 just 3/9 being around 95-1/2 kg.  Regarding renal function, PTA, renal function seemed to run in the mid to high ones-was 1.65 on 07/26/2019.  When hospitalized creatinine was initially 2 but has steadily worsened and is at a level of 5.76 today.  Urinalysis shows 30 of protein and no cells.  There is not a renal imaging study.  Urine output yesterday recorded at 300 cc.  Blood pressure for the most part has been in the 90s.  Heart rate in the 120s to 130s.  There has been discussion that he is at end-stage with his amyloidosis ?  Palliative care has seen the patient today.  Patient is really unable to provide any history.  He does not know if he is making any urine.  Wife appears to be focused on " why can he get a pacemaker" they are possibly interested in peritoneal dialysis which might be able to work but would need to initiate as an outpatient as no one will place PD catheter is here.  His cardiac function is too poor for hemodialysis  Creat  Date/Time Value Ref Range Status  04/22/2016 09:17 AM 1.20 (H) 0.70 - 1.18 mg/dL Final    Comment:      For patients > or = 79 years of age: The upper reference limit  for Creatinine is approximately 13% higher for people identified as African-American.     12/25/2015 08:11 AM 1.10 0.70 - 1.18 mg/dL Final    Comment:      For patients > or = 79 years of age: The upper reference limit for Creatinine is approximately 13% higher for people identified as African-American.     10/02/2015 08:15 AM 1.09 0.70 - 1.18 mg/dL Final  08/28/2015 03:41 PM 0.91 0.70 - 1.18 mg/dL Final   Creatinine, Ser  Date/Time Value Ref Range Status  08/09/2019 09:33 AM 5.76 (H) 0.61 - 1.24 mg/dL Final  08/08/2019 03:13 AM 4.50 (H) 0.61 - 1.24 mg/dL Final  08/07/2019 04:35 AM 3.78 (H) 0.61 - 1.24 mg/dL Final  08/06/2019 04:42 AM 3.33 (H) 0.61 - 1.24 mg/dL Final  08/05/2019 07:11 AM 2.75 (H) 0.61 - 1.24 mg/dL Final    Comment:    DELTA CHECK NOTED  08/04/2019 04:34 AM 1.74 (H) 0.61 - 1.24 mg/dL Final  08/03/2019 02:15 PM 2.03 (H) 0.61 - 1.24 mg/dL Final  07/26/2019 11:48 AM 1.65 (H) 0.76 - 1.27 mg/dL Final  05/19/2019 04:56 PM 1.58 (H) 0.61 - 1.24 mg/dL Final  04/13/2019 01:44 PM 1.60 (H) 0.61 - 1.24 mg/dL Final  04/06/2019 12:35 PM 1.51 (H) 0.61 - 1.24 mg/dL Final  04/05/2019 03:26 PM 1.58 (H) 0.76 - 1.27 mg/dL Final  03/29/2019 09:45 AM 1.46 (H) 0.76 - 1.27 mg/dL Final  03/14/2019 11:47  AM 1.34 (H) 0.61 - 1.24 mg/dL Final  02/22/2019 10:11 AM 1.81 (H) 0.61 - 1.24 mg/dL Final  02/15/2019 04:50 AM 1.32 (H) 0.61 - 1.24 mg/dL Final  02/14/2019 07:19 AM 1.48 (H) 0.61 - 1.24 mg/dL Final  02/13/2019 03:10 PM 1.49 (H) 0.61 - 1.24 mg/dL Final  02/13/2019 06:31 AM 1.24 0.61 - 1.24 mg/dL Final  02/12/2019 12:20 AM 1.44 (H) 0.61 - 1.24 mg/dL Final  02/11/2019 06:10 AM 1.48 (H) 0.61 - 1.24 mg/dL Final  02/10/2019 01:20 AM 1.25 (H) 0.61 - 1.24 mg/dL Final  02/09/2019 05:42 AM 1.36 (H) 0.61 - 1.24 mg/dL Final  02/08/2019 08:35 PM 1.49 (H) 0.61 - 1.24 mg/dL Final  02/08/2019 06:50 PM 1.43 (H) 0.61 - 1.24 mg/dL Final  01/30/2019 12:57 PM 1.25 (H) 0.61 - 1.24 mg/dL Final   12/19/2018 03:26 PM 1.50 (H) 0.61 - 1.24 mg/dL Final  12/08/2018 10:42 AM 1.60 (H) 0.61 - 1.24 mg/dL Final  10/19/2018 11:34 AM 1.28 (H) 0.61 - 1.24 mg/dL Final  10/19/2018 10:10 AM 1.29 (H) 0.76 - 1.27 mg/dL Final  06/28/2018 09:14 AM 1.36 (H) 0.61 - 1.24 mg/dL Final  06/15/2018 08:53 AM 2.14 (H) 0.76 - 1.27 mg/dL Final  06/07/2018 04:44 AM 1.78 (H) 0.61 - 1.24 mg/dL Final  06/06/2018 05:37 AM 1.45 (H) 0.61 - 1.24 mg/dL Final  06/05/2018 04:24 AM 1.59 (H) 0.61 - 1.24 mg/dL Final  06/04/2018 04:50 AM 1.48 (H) 0.61 - 1.24 mg/dL Final  06/03/2018 05:19 AM 1.33 (H) 0.61 - 1.24 mg/dL Final  06/02/2018 04:02 AM 1.59 (H) 0.61 - 1.24 mg/dL Final  06/01/2018 01:20 AM 1.26 (H) 0.61 - 1.24 mg/dL Final  05/31/2018 01:33 PM 1.13 0.61 - 1.24 mg/dL Final  03/10/2018 04:25 AM 1.15 0.61 - 1.24 mg/dL Final  03/09/2018 04:11 AM 1.19 0.61 - 1.24 mg/dL Final  03/08/2018 11:56 AM 1.37 (H) 0.61 - 1.24 mg/dL Final  04/28/2016 08:51 AM 1.14 0.61 - 1.24 mg/dL Final  04/18/2016 03:15 AM 1.05 0.61 - 1.24 mg/dL Final  04/17/2016 02:23 AM 1.15 0.61 - 1.24 mg/dL Final  04/16/2016 02:41 AM 1.25 (H) 0.61 - 1.24 mg/dL Final  04/15/2016 03:22 AM 1.26 (H) 0.61 - 1.24 mg/dL Final  04/14/2016 02:20 AM 1.07 0.61 - 1.24 mg/dL Final  04/13/2016 09:59 AM 1.13 0.61 - 1.24 mg/dL Final  04/12/2016 09:00 AM 1.10 0.61 - 1.24 mg/dL Final  03/22/2016 11:55 AM 0.92 0.61 - 1.24 mg/dL Final     PMHx:   Past Medical History:  Diagnosis Date  . Arthritis    "left foot" (06/01/2018)  . Atrial fibrillation ([place])   . CAD (coronary artery disease)   . Cardiomyopathy ([place])   . Cellulitis and abscess of left leg 02/08/2019  . Chronic systolic CHF (congestive heart failure) ([place]) 04/12/2016  . Dyspnea   . Dysrhythmia Atrial flutter  . 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 Hypertension     Past Surgical History:  Procedure Laterality Date  . CARDIOVERSION N/A 09/04/2015   Procedure:  CARDIOVERSION;  Surgeon: Skeet Latch, MD;  Location: [place];  Service: Cardiovascular;  Laterality: N/A;  . IR THORACENTESIS ASP PLEURAL SPACE W/IMG GUIDE  06/02/2018  . TEE WITHOUT CARDIOVERSION N/A 09/04/2015   Procedure: TRANSESOPHAGEAL ECHOCARDIOGRAM (TEE);  Surgeon: Skeet Latch, MD;  Location: [place];  Service: Cardiovascular;  Laterality: N/A;  . TEE WITHOUT CARDIOVERSION N/A 04/15/2016   Procedure: TRANSESOPHAGEAL ECHOCARDIOGRAM (TEE);  Surgeon: Jerline Pain, MD;  Location: [facility]  ENDOSCOPY;  Service: Cardiovascular;  Laterality: N/A;  . TOE AMPUTATION Right    4th digit    Family Hx:  Family History  Problem Relation Age of Onset  . Heart disease Other        No family history    Social History:  reports that he quit smoking about 31 years ago. His smoking use included cigarettes. He smoked 0.12 packs per day. He has never used smokeless tobacco. He reports previous alcohol use. He reports that he does not use drugs.  Allergies:  Allergies  Allergen Reactions  . Pradaxa [Dabigatran Etexilate Mesylate] Nausea Only  . Penicillins Rash    Did it involve swelling of the face/tongue/throat, SOB, or low BP? No Did it involve sudden or severe rash/hives, skin peeling, or any reaction on the inside of your mouth or nose? No Did you need to seek medical attention at a hospital or doctor's office? Unk When did it last happen? "it happened many years ago" If all above answers are "NO", may proceed with cephalosporin use.     Medications: Prior to Admission medications   Medication Sig Start Date End Date Taking? Authorizing Provider  apixaban (ELIQUIS) 5 MG TABS tablet Take 1 tablet (5 mg total) by mouth 2 (two) times daily. 08/28/15  Yes Lelon Perla, MD  dofetilide (TIKOSYN) 125 MCG capsule Take 1 capsule (125 mcg total) by mouth 2 (two) times daily. 06/07/18  Yes Shirley Friar, PA-C  dorzolamide (TRUSOPT) 2 % ophthalmic solution Place 1 drop into both  eyes 2 (two) times daily.   Yes [provider]  finasteride (PROSCAR) 5 MG tablet Take 5 mg by mouth daily.   Yes [provider]  latanoprost (XALATAN) 0.005 % ophthalmic solution Place 1 drop into both eyes at bedtime.   Yes [provider]  metolazone (ZAROXOLYN) 2.5 MG tablet Take 1 tablet (2.5 mg total) by mouth as directed. By HF clinic for swelling Patient taking differently: Take 2.5 mg by mouth daily as needed. By HF clinic for swelling 04/13/19  Yes Bensimhon, Shaune Pascal, MD  midodrine (PROAMATINE) 5 MG tablet Take 1 tablet (5 mg total) by mouth 3 (three) times daily with meals. Patient taking differently: Take 5 mg by mouth daily as needed.  04/06/19  Yes Bensimhon, Shaune Pascal, MD  Multiple Vitamins-Minerals (ONE-A-DAY MENS 50+ ADVANTAGE) TABS Take 1 tablet by mouth daily with breakfast.   Yes [provider]  Polyvinyl Alcohol (LIQUID TEARS OP) Place 1-2 drops into both eyes as needed (for dry eyes).   Yes [provider]  potassium chloride SA (K-DUR) 20 MEQ tablet Take 2 tablets (40 mEq total) by mouth every morning AND 1 tablet (20 mEq total) every evening. Patient taking differently: take one tablet by mouth twice daily 01/30/19  Yes Bensimhon, Shaune Pascal, MD  Tafamidis 61 MG CAPS Take 61 mg by mouth daily after breakfast.    Yes [provider]  torsemide (DEMADEX) 20 MG tablet Take 2 tablets (40 mg total) by mouth 2 (two) times daily. 07/12/19 10/10/19 Yes Lelon Perla, MD  AMBULATORY NON [place] chair 06/30/19   Narda Amber K, DO  HYDROcodone-acetaminophen (NORCO/VICODIN) 5-325 MG tablet Take 1 tablet by mouth every 4 (four) hours as needed. Patient not taking: Reported on 08/03/2019 05/19/19   Isla Pence, MD  traMADol (ULTRAM) 50 MG tablet Take 1 tablet (50 mg total) by mouth 2 (two) times daily as needed for severe pain. Patient not taking:  Reported on 08/03/2019 05/25/19   Bensimhon, Shaune Pascal, MD    I  have reviewed the patient's current medications.  Labs:  Results for orders placed or performed during the hospital encounter of 08/03/19 (from the past 48 hour(s))  Basic metabolic panel     Status: Abnormal   Collection Time: 08/08/19  3:13 AM  Result Value Ref Range   Sodium 133 (L) 135 - 145 mmol/L   Potassium 4.0 3.5 - 5.1 mmol/L   Chloride 91 (L) 98 - 111 mmol/L   CO2 26 22 - 32 mmol/L   Glucose, Bld 122 (H) 70 - 99 mg/dL    Comment: Glucose reference range applies only to samples taken after fasting for at least 8 hours.   BUN 71 (H) 8 - 23 mg/dL   Creatinine, Ser 4.50 (H) 0.61 - 1.24 mg/dL   Calcium 9.1 8.9 - 10.3 mg/dL   GFR calc non Af Amer 12 (L) >60 mL/min   GFR calc Af Amer 13 (L) >60 mL/min   Anion gap 16 (H) 5 - 15    Comment: Performed at [facility] [address], [place], [place] [address]  Basic metabolic panel     Status: Abnormal   Collection Time: 08/09/19  9:33 AM  Result Value Ref Range   Sodium 131 (L) 135 - 145 mmol/L   Potassium 3.8 3.5 - 5.1 mmol/L   Chloride 90 (L) 98 - 111 mmol/L   CO2 23 22 - 32 mmol/L   Glucose, Bld 155 (H) 70 - 99 mg/dL    Comment: Glucose reference range applies only to samples taken after fasting for at least 8 hours.   BUN 78 (H) 8 - 23 mg/dL   Creatinine, Ser 5.76 (H) 0.61 - 1.24 mg/dL   Calcium 9.2 8.9 - 10.3 mg/dL   GFR calc non Af Amer 9 (L) >60 mL/min   GFR calc Af Amer 10 (L) >60 mL/min   Anion gap 18 (H) 5 - 15    Comment: Performed at [facility] [address] [place], [place] [address]     ROS:  Pertinent items are noted in HPI.  He has no energy, he coughs up phlegm a lot.  He has an appetite but does not eat much.  He has lower extremity edema  Physical Exam: Vitals:   08/09/19 1347 08/09/19 1411  BP: 99/80 96/83  Pulse: (!) 122 (!) 112  Resp: 17 18  Temp:  (!) 97.5 F (36.4 C)  SpO2: 90% 99%     General: Actually does not look to be his age.  Sitting in bedside chair.  He does not  comprehend information the best but acknowledges this HEENT: Pupils are equal round reactive to light, triple motions are intact, mucous membranes are moist Neck: Positive for JVD Heart: Tachycardic, irregular Lungs: Cannot take deep breaths, decreased breath sounds at the bases Abdomen: Slightly distended Extremities: Pitting edema to the hips Skin: Warm and dry Neuro: Alert, slightly slow processing-nonfocal  Assessment/Plan: 79 year old black male with mostly cardiac issues-cardiac amyloid, decreased ejection fraction, atrial fibrillation and SVT.  He has CKD but has developed acute on chronic CKD during this hospitalization for arrhythmias and hemodynamic instability 1.Renal-acute on chronic renal failure in the setting of hemodynamic instability, hypotension and tachycardia.  Urinalysis is bland some makes this a pre or post renal event.  Will check a renal ultrasound as patient does not know if he is made any urine.  This  is a very difficult situation.  Cardiac function could not tolerate hemodialysis.  Possibly interested in peritoneal dialysis but from what I can tell-would really need to be transferred to another institution to initiate peritoneal dialysis acutely as we do not have that capability here.  Patient and wife were taken aback when I found out it was every day for 10 hours-they are not sure if they want to pursue that just yet.  There are no absolute indications for dialysis right now but we will get close if there are really no options to improve his cardiac function 2. Hypertension/volume  -volume overloaded and hypotensive and tachycardic.  He is on midodrine, will increase that to max dose.  I agree that he is volume overloaded, however is not hypoxic that it does not seem to be bothering him much so not sure I would pursue aggressive diuretics at this point.  Do not know his albumin or he is third spacing 3.  Cardiomyopathy-not sure if there are any other options regarding this  patient.  Has a consult to heart failure team been considered?  4. Anemia  -does not appear to be an issue at this time   Louis Meckel 08/09/2019, 2:25 PM

## 2019-08-09 NOTE — Progress Notes (Signed)
  Mobility Specialist Criteria Algorithm Info.  SATURATION QUALIFICATIONS: (This note is used to comply with regulatory documentation for home oxygen)  Patient Saturations on Room Air at Rest = 91%  Patient Saturations on Room Air while Ambulating = 85%  Patient Saturations on 2 Liters of oxygen while Ambulating =95%  Please briefly explain why patient needs home oxygen: After walking about 120 feet the patients oxygen saturation dropped to low/mid 80's requiring 2LO2 to maintain an oxygen saturation >91%.  Mobility:  HOB elevated:Self regulated Activity: Ambulated in hall;Transferred:  Bed to chair;Dangled on edge of bed (Dangle EOB before ambulation, to chair after ambulation) Range of motion: Active;All extremities Level of assistance: Standby assist, set-up cues, supervision of patient - no hands on ([employer] ) Assistive device: Other (Comment) (IV Pole. Wall rails and furniture) Minutes sitting in chair:  Minutes stood: 10 minutes Minutes ambulated: 10 minutes Distance ambulated (ft): 500 ft Mobility response: Tolerated well;RN notified (Pt did well ambulating without any LOB or unsteadiness) Bed Position: Chair (Recliner)     08/09/2019 2:51 PM

## 2019-08-09 NOTE — Progress Notes (Signed)
Patient ID: Christopher Benton, male   DOB: July 30, 1940, 79 y.o.   MRN: [id number]   This NP visited patient at the bedside as a follow up to  yesterday's [place], for palliative medicine needs and emotional support.  Patient remains weak but is alert and oriented, he engages with me in conversation expressing appreciation for yesterdays [place] meeting with his wife and children.  It was "very helpful", "a man needs to propared for here and be prepared to leave" He is tearful at times.  Patient's wife came to room for visitation.  Continued conversation regarding current medical situation.  She verbalizes that she "hears what medical team is saying" (limitation of treatment options to prolong life) but will hold strong to her faith and trust in God for healing.  Discussed with patient/wife the importance of continued conversation with each other and family and their  medical providers regarding overall plan of care and treatment options,  ensuring decisions are within the context of the patients values and GOCs.  I returned to room for further conversation with Dr Meda Coffee, education and clarification on OP palliative services and home hospice benefit.  At this time patient and his wife are open to all offered and available medical interventions to prolong life.   Remains full code  Patient and his wife await input from Nephrology regarding worsening renal function.  Questions and concerns addressed   This NP will f/u in the morning  Total time spent on the unit was 45 minutes  Greater than 50% of the time was spent in counseling and coordination of care  Wadie Lessen NP  Palliative Medicine Team Team Phone # [phone] Pager [phone]

## 2019-08-10 ENCOUNTER — Inpatient Hospital Stay (HOSPITAL_COMMUNITY): Payer: No Typology Code available for payment source

## 2019-08-10 DIAGNOSIS — I959 Hypotension, unspecified: Secondary | ICD-10-CM

## 2019-08-10 DIAGNOSIS — I484 Atypical atrial flutter: Secondary | ICD-10-CM

## 2019-08-10 DIAGNOSIS — E859 Amyloidosis, unspecified: Secondary | ICD-10-CM

## 2019-08-10 DIAGNOSIS — N179 Acute kidney failure, unspecified: Secondary | ICD-10-CM

## 2019-08-10 DIAGNOSIS — I5033 Acute on chronic diastolic (congestive) heart failure: Secondary | ICD-10-CM

## 2019-08-10 DIAGNOSIS — I9589 Other hypotension: Secondary | ICD-10-CM

## 2019-08-10 LAB — RENAL FUNCTION PANEL
Albumin: 3.3 g/dL — ABNORMAL LOW (ref 3.5–5.0)
Anion gap: 19 — ABNORMAL HIGH (ref 5–15)
BUN: 85 mg/dL — ABNORMAL HIGH (ref 8–23)
CO2: 23 mmol/L (ref 22–32)
Calcium: 9.2 mg/dL (ref 8.9–10.3)
Chloride: 90 mmol/L — ABNORMAL LOW (ref 98–111)
Creatinine, Ser: 7.15 mg/dL — ABNORMAL HIGH (ref 0.61–1.24)
GFR calc Af Amer: 8 mL/min — ABNORMAL LOW (ref >60)
GFR calc non Af Amer: 7 mL/min — ABNORMAL LOW (ref >60)
Glucose, Bld: 110 mg/dL — ABNORMAL HIGH (ref 70–99)
Phosphorus: 5.5 mg/dL — ABNORMAL HIGH (ref 2.5–4.6)
Potassium: 4.1 mmol/L (ref 3.5–5.1)
Sodium: 132 mmol/L — ABNORMAL LOW (ref 135–145)

## 2019-08-10 MED ORDER — FUROSEMIDE 10 MG/ML IJ SOLN
80.0000 mg | Freq: Three times a day (TID) | INTRAMUSCULAR | Status: DC
  Administered 2019-08-10: 80 mg via INTRAVENOUS
  Filled 2019-08-10 (×2): qty 8

## 2019-08-10 NOTE — Progress Notes (Signed)
I have reviewed the patient's course over the past 4 days. Despite IV amiodarone he continues to have SVT with elevated heart rate.  His blood pressure is borderline.  He is markedly volume overloaded.  His renal function continues to deteriorate and his creatinine is now greater than 7.  I had a long discussion with patient and his wife this evening.  I explained that we had no other options for treatment of his amyloid.  I do not think peritoneal dialysis would be a good option for him given the severity of his disease.  I explained to her that I felt as though he would not survive and recommended palliative care/comfort measures. I have also discussed the patient with Dr. Haroldine Laws and Dr. Meda Coffee.  They also feel that there is nothing left to offer other than comfort measures.  During my visit the patient stated he was "tired and was ready to go"; " I do not want to live like this".  His wife is having a difficult time accepting this.  Prognosis is extremely poor. Christopher Benton

## 2019-08-10 NOTE — Discharge Summary (Signed)
Discharge Summary    Patient ID: Christopher Benton MRN: [id number]; DOB: [birth year]  Admit date: 08/03/2019 Discharge date: 08/15/2019  Primary Care Provider: Default, Provider, MD  Primary Cardiologist: Kirk Ruths, MD  Primary Electrophysiologist:  None   Discharge Diagnoses    Principal Problem:   Supraventricular arrhythmia Active Problems:   Atypical atrial flutter (HCC)   PAF (paroxysmal atrial fibrillation) ([place])   AKI (acute kidney injury) ([place])   Amyloidosis ([place])   Acute on chronic diastolic CHF (congestive heart failure) ([place])   ARF (acute renal failure) ([place])   Palliative care by specialist   DNR (do not resuscitate) discussion   Weakness generalized   Hypotension   Comfort measures only status   DNR (do not resuscitate)   CKD (chronic kidney disease) stage 5, GFR less than 15 ml/min (HCC)  Diagnostic Studies/Procedures    Renal US  IMPRESSION: 1. No hydronephrosis. 2. Echogenic normal size kidneys bilaterally, compatible with the provided clinical history of nonspecific acute renal parenchymal disease. 3. Bladder decompressed and not well evaluated. 4. Incidental large bilateral pleural effusions. ____________   History of Present Illness     Christopher Benton is a 79 y.o. 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  Patient was admitted in 11/17 with dyspnea and right pleural effusion. He had thoracentesis and cytology negative. TEE showed EF 35 to 40%. Was seen in the office by Dr. Stanford Breed 07/12/2019 at which time he was having dyspnea on exertion and bilateral lower extremity edema.  No chest pain.  His Demadex was increased to 40 g twice daily metolazone 2.5 mg as needed.  Hospital Course     Consultants: Palliative care, nephrology, electrophysiology  The patient  presented to the ED 08/03/2019 for decreased energy, shortness of breath, orthopnea, and decreased appetite.  Also reported that he was coughing up clear white phlegm. Patient had been requiring metolazone for the last 3 days. Said this improved his breathing minimally. He reported mild chest discomfort with coughing. No palpitations. Denied worsening lower leg edema. The ED EKG showed SVT rates in the 140-150 range. Dr. Stanford Breed tried carotid massage which broke rhythm into normal sinus briefly but reverted back to SVT. Adenosine was not administered. His Tikosyn was held for 24 hours with the plan to start IV amiodarone. His home Demadex dose was continued on admission. Dr. Rayann Heman and Dr. Curt Bears were consulted for other possible options for SVT who agreed antiarrhythmic options were limited.  IV amiodarone was started.  Home metoprolol was continued but could not be titrated due to systolic pressures in the 90s.  Patient became severely weak with no energy and minimally responsive. ABG showed PCO2 56.  Demadex was discontinued and IV Lasix 120 mg twice daily was started.  He was noted SVT had terminated and the patient had converted into sinus tachycardia with Mobitz 1 second-degree AV block. Metoprolol was discontinued and EP was asked to consult who agreed with continuation of IV amodarone. Renal function increased and diuretics were held. Patient's status began to worsen throughout the hospital course.  He reported decreased urine output, decreased appetite and dyspnea on exertion.  Kidney function continued to worsen and palliative care as well as nephrology were consulted.  Palliative care saw the patient who recommended DNR/DNI status given poor outcome. Patient's wife requested all medical interventions to prolong  life. Patient continued to be volume overloaded and creatinine continued to worsen.  Also noted to go into A. fib RVR.  Nephrology saw the patient who discussed peritoneal dialysis with the patient  and family however noted that he would need to be transferred to another facility as there is no acute capabilities here at [facility]. After further discussion and learning of the 10 hours per day involvement, they seemed disinterested in pursing this potential option. Midodrine was increased to max dose. Renal ultrasound showed no hydronephrosis, no signs kidneys and decompressed bladder, and the finding of incidental large bilateral pleural effusions. Patient felt to be at end-stage heart failure with his worsening kidney function (rising with a max of 9.28 near day of discharge on 08/12/2019). There was much confusion about end-of-life goals between the patient and his family.  His wife requested to continue full measures however the patient noted that he was " ready to die" on multiple occasions to both Dr. Meda Coffee as well as bedside RN.  Home hospice was discussed at length with the patient and the family however the wife continued refused this. The situation became very complicated.   Per Dr. Stanford Breed, "I have reviewed the patient's course over the past 4 days. Despite IV amiodarone he continues to have SVT with elevated heart rate.  His blood pressure is borderline.  He is markedly volume overloaded.  His renal function continues to deteriorate and his creatinine is now greater than 7.  I had a long discussion with patient and his wife this evening.  I explained that we had no other options for treatment of his amyloid.  I do not think peritoneal dialysis would be a good option for him given the severity of his disease.  I explained to her that I felt as though he would not survive and recommended palliative care/comfort measures. I have also discussed the patient with Dr. Haroldine Laws and Dr. Meda Coffee.  They also feel that there is nothing left to offer other than comfort measures. During my visit the patient stated he was "tired and was ready to go"; " I do not want to live like this".  His wife is having a difficult  time accepting this.  Prognosis is extremely poor."  Per Dr. Francesca Oman note, "this is a very difficult situation, we have been having discussions with wife and family including palliative care since Monday, patient's wife wants to continue full measures she is not ready to change CODE STATUS to DNR, she was explained and offered for home hospice care however she refused.  Per nurse the patient was crying this morning and stating he is ready to die, he appears very tired and short of breath, however family is not accepting his wishes. Nephrology has seen the patient, and agrees that at this point there is nothing else to offer to this patient.  His kidney function is progressively worsening, he has gained 14 pounds in 1 week, he is becoming anuric and more swollen, slowly more uremic as well he was nauseated and vomiting yesterday.  This patient actively dying and per his own words he wants to go and does not want to prolong this condition, however his wife was in denial and trying to find any ways to make him better. There were many daily discussions with the wife including primary team, nephrology, palliative care, they were absolutely refusing to change to DNR and home hospice despite explaining that he might be a more comfortable comfort of home and his children, the  wife was not ready to hear that. Over the weekend many discussion were had in regards to home hospice and comfort care.   Family had a difficult time transitioning to DNR.One last attempt to discuss hospital discharge with patients wife. Reiterated difficult situations which were to likely occur such as transporting the patient from the hospital to home, symptom management once the patient is at home and equipment needed to appropriately care for the patient while at home. Hospice and palliative home health would be able to assist with this situation, to safely and humanely care for this patient. The wife initially continued to refuse care  but after multiple discussion she agree on home hospice and comfort care.   On day of discharge, the patient has had minimal urine output in the past 24 hours.  Remains significantly volume overloaded on examination.  The family is agreeable for the patient to be discharged with hospice care.  Wife is agreeable although his wife continues to have difficulties.  Most medications have been discontinued due to comfort care status. Will discharge home with comfort care medications. Patient will be transported home with hospice care.  Did the patient have an acute coronary syndrome (MI, NSTEMI, STEMI, etc) this admission?:  No                               Did the patient have a percutaneous coronary intervention (stent / angioplasty)?:  No.   _____________  Discharge Vitals Blood pressure 106/79, pulse (!) 126, temperature 98.2 F (36.8 C), temperature source Oral, resp. rate 17, height 5' 11.5" (1.816 m), weight 97.3 kg, SpO2 91 %.  Filed Weights   08/11/19 0529 08/12/19 0316 08/15/19 0118  Weight: 98 kg 96 kg 97.3 kg   Labs & Radiologic Studies    Basic Metabolic Panel No results for input(s): NA, K, CL, CO2, GLUCOSE, BUN, CREATININE, CALCIUM, MG, PHOS in the last 72 hours. Liver Function Tests No results for input(s): AST, ALT, ALKPHOS, BILITOT, PROT, ALBUMIN in the last 72 hours._____________  US RENAL  Result Date: 08/10/2019 CLINICAL DATA:  Acute kidney injury.  Inpatient. EXAM: RENAL / URINARY TRACT ULTRASOUND COMPLETE COMPARISON:  06/13/2019 CT abdomen/pelvis FINDINGS: Right Kidney: Renal measurements: 12.2 x 6.0 x 6.6 cm = volume: 251 mL. No hydronephrosis. Echogenic normal thickness renal parenchyma. No mass. Left Kidney: Renal measurements: 12.0 x 6.3 x 6.5 cm = volume: 258 mL. No hydronephrosis. Echogenic normal thickness renal parenchyma. No mass. Bladder: Bladder decompressed and not well evaluated with no bladder abnormality demonstrated. Other: Incidental large bilateral pleural  effusions demonstrated. IMPRESSION: 1. No hydronephrosis. 2. Echogenic normal size kidneys bilaterally, compatible with the provided clinical history of nonspecific acute renal parenchymal disease. 3. Bladder decompressed and not well evaluated. 4. Incidental large bilateral pleural effusions. Electronically Signed   By: Ilona Sorrel M.D.   On: 08/10/2019 10:43   DG CHEST PORT 1 VIEW  Result Date: 08/07/2019 CLINICAL DATA:  Atrial flutter, weakness. EXAM: PORTABLE CHEST 1 VIEW COMPARISON:  08/04/2019 and 03/08/2018. FINDINGS: Trachea is midline. Heart size is grossly stable. Thoracic aorta is calcified. Lungs are low in volume with mild interstitial prominence and indistinctness as well as moderate bilateral pleural effusions and bibasilar compressive atelectasis. Findings are similar to 08/04/2019. IMPRESSION: Congestive heart failure with bibasilar compressive atelectasis. Difficult to exclude pneumonia. Electronically Signed   By: Lorin Picket M.D.   On: 08/07/2019 08:52   DG CHEST PORT 1  VIEW  Result Date: 08/04/2019 CLINICAL DATA:  Shortness of breath. Additional history provided by technologist: History of atrial flutter. EXAM: PORTABLE CHEST 1 VIEW COMPARISON:  Chest radiograph 08/03/2019 FINDINGS: The cardiomediastinal silhouette is unchanged. Aortic atherosclerosis. Again demonstrated are small to moderate bilateral pleural effusions with associated bibasilar atelectasis. Consolidation at the lung bases cannot be excluded. No evidence of pneumothorax. No acute bony abnormality. Overlying cardiac monitoring leads. IMPRESSION: No significant interval change as compared to chest radiograph 08/03/2019. Persistent small to moderate bilateral pleural effusions with bibasilar atelectasis. Consolidation at the lung bases cannot be excluded. Aortic atherosclerosis. Electronically Signed   By: Kellie Simmering DO   On: 08/04/2019 13:30   DG Chest Portable 1 View  Result Date: 08/03/2019 CLINICAL DATA:  Pt  came from his 40 office for a-flutter. Per EMS patient has been feeling unwell since Saturday with decreased appetite, increased generalized weakness. Pt also reports he has not had a bowel movement since last Thursday. Hx HTN. weakness EXAM: PORTABLE CHEST 1 VIEW COMPARISON:  05/19/2019 FINDINGS: Normal cardiac silhouette. There is bilateral pleural effusions which are moderate volume. Upper lungs clear. No pneumothorax. IMPRESSION: Low lung volumes and bilateral moderate pleural effusions. Electronically Signed   By: Suzy Bouchard M.D.   On: 08/03/2019 14:29   Disposition   Pt is being discharged home today in unstable condition.  Follow-up Plans & Appointments    Follow-up Information    [facility].   Why: [facility] Contact information: [address] [place] [address] [phone]            Discharge Medications   Allergies as of 08/15/2019      Reactions   Pradaxa [dabigatran Etexilate Mesylate] Nausea Only   Penicillins Rash   Did it involve swelling of the face/tongue/throat, SOB, or low BP? No Did it involve sudden or severe rash/hives, skin peeling, or any reaction on the inside of your mouth or nose? No Did you need to seek medical attention at a hospital or doctor's office? Unk When did it last happen? "it happened many years ago" If all above answers are "NO", may proceed with cephalosporin use.      Medication List    STOP taking these medications   AMBULATORY NON FORMULARY MEDICATION   apixaban 5 MG Tabs tablet Commonly known as: ELIQUIS   dofetilide 125 MCG capsule Commonly known as: TIKOSYN   finasteride 5 MG tablet Commonly known as: PROSCAR   HYDROcodone-acetaminophen 5-325 MG tablet Commonly known as: NORCO/VICODIN   metolazone 2.5 MG tablet Commonly known as: ZAROXOLYN   midodrine 5 MG tablet Commonly known as: PROAMATINE   One-A-Day Mens 50+ Advantage Tabs   potassium chloride SA 20 MEQ  tablet Commonly known as: KLOR-CON   Tafamidis 61 MG Caps   torsemide 20 MG tablet Commonly known as: DEMADEX   traMADol 50 MG tablet Commonly known as: ULTRAM     TAKE these medications   docusate sodium 100 MG capsule Commonly known as: COLACE Take 2 capsules (200 mg total) by mouth 2 (two) times daily.   dorzolamide 2 % ophthalmic solution Commonly known as: TRUSOPT Place 1 drop into both eyes 2 (two) times daily.   glycopyrrolate 1 MG tablet Commonly known as: ROBINUL Take 1 tablet (1 mg total) by mouth every 6 (six) hours.   latanoprost 0.005 % ophthalmic solution Commonly known as: XALATAN Place 1 drop into both eyes at bedtime.   LIQUID TEARS OP Place 1-2 drops  into both eyes as needed (for dry eyes).   pantoprazole 20 MG tablet Commonly known as: PROTONIX Take 1 tablet (20 mg total) by mouth daily. Start taking on: August 16, 2019   polyethylene glycol 17 g packet Commonly known as: MIRALAX / GLYCOLAX Take 17 g by mouth 2 (two) times daily.          Outstanding Labs/Studies   None   Duration of Discharge Encounter   Greater than 30 minutes including physician time.  Signed, Elan Brainerd Ninfa Meeker, PA-C 08/15/2019, 11:39 AM

## 2019-08-10 NOTE — Progress Notes (Signed)
Patient ID: Christopher Benton, male   DOB: 18-Oct-1940, 79 y.o.   MRN: [id number]   This NP visited patient at the bedside as a follow up to  yesterday's [place], for palliative medicine needs and emotional support.  Patient is weaker but is alert and oriented, renal functioning is worsening , creatine is 7.15 today,   his wife is at bedside.  [name] voices her questions and concerns regarding current treatment plan.  She verbalizes frustration with " not getting clear information on options".  She is questioning that peritoneal dialysis was discussed yesterday with nephrology and she wants to know how to move forward with that intervention.. She agrees to transition to another facility if necessary.   I personally spoke with Dr. Moshe Cipro, and the plan for today is for Dr. Haroldine Laws to weigh in on the situation regarding the seriousness of his heart disease.  Dr. Moshe Cipro plans to collaborate with Dr. Haroldine Laws and discussed next steps with patient and his wife   Also today I spoke with Kirke Shaggy his bedside RN who shared with me the patient's conversation with her this morning regarding the statements of being tired of fighting and ready to die.  Concept specific to human mortality and the limitations of medical interventions to prolong quality of life for discussed.  I raised this to both the patient and the patient's wife.  The patient does verbalize to his wife that "I am tired and ready to go", however wife speaks to him with scripture support and he then verbalizes his openness to all offered and available medical interventions to prolong life.  They both have faith that God can heal this situation.   Discussed with patient/wife the importance of continued conversation with each other and family and their  medical providers regarding overall plan of care and treatment options,  ensuring decisions are within the context of the patients values and GOCs.   Patient and his  wife await input from Dr. Haroldine Laws regarding heart disease and worsening renal function.   Questions and concerns addressed     This nurse practitioner informed  the patient/family that I will be out of the hospital until Sunday morning.  If the patient is still hospitalized I will follow-up at that time.  Gave contact information to wife to call me if she has questions or concerns  Total time spent on the unit was 45 minutes  Greater than 50% of the time was spent in counseling and coordination of care  Wadie Lessen NP  Palliative Medicine Team Team Phone # [phone] Pager [phone]

## 2019-08-10 NOTE — Plan of Care (Signed)

## 2019-08-10 NOTE — Progress Notes (Signed)
Subjective:  Only 100 of UOP recorded-  crt up again - recorded BPs seem better but HR is still high.  He does not feel that different  Objective Vital signs in last 24 hours: Vitals:   08/10/19 0640 08/10/19 0641 08/10/19 0642 08/10/19 0753  BP:      Pulse: (!) 124     Resp: 19     Temp:   97.7 F (36.5 C) 98.2 F (36.8 C)  TempSrc:   Oral Oral  SpO2: 93%     Weight:  96.3 kg    Height:       Weight change:   Intake/Output Summary (Last 24 hours) at 08/10/2019 4696 Last data filed at 08/10/2019 0500 Gross per 24 hour  Intake 539.53 ml  Output 100 ml  Net 439.53 ml     Assessment/Plan: 79 year old black male with mostly cardiac issues-cardiac amyloid, decreased ejection fraction, atrial fibrillation and SVT.  He has CKD but has developed acute on chronic CKD during this hospitalization for arrhythmias and hemodynamic instability 1.Renal-acute on chronic renal failure in the setting of hemodynamic instability, hypotension and tachycardia.  Urinalysis is bland some makes this a pre or post renal event.  Will check a renal ultrasound as patient does not know if he is made any urine.  This is a very difficult situation.  Cardiac function likely would not tolerate hemodialysis.  Possibly interested in peritoneal dialysis but from what I can tell-would really need to be transferred to another institution to initiate peritoneal dialysis acutely as we do not have that capability here.  Patient and wife were taken aback when they found out it was every day for 10 hours-they are not sure if they want to pursue that just yet.  There are no absolute indications for dialysis right now but we will get close if there are really no options to improve his cardiac function 2. Hypertension/volume  -volume overloaded and hypotensive and tachycardic.  He is on midodrine, have increased that to max dose.  I agree that he is volume overloaded, however is not hypoxic. Albumin is not bad so is likely not third  spacing.  Will challenge with IV lasix today  3.  Cardiomyopathy-not sure if there are any other options regarding this patient. I spoke to Dr. Aundra Dubin who will pass along to [place] who has seen him in the office- family would appreciate their input.  Family not necessarily accepting that he may be "end stage heart"  4. Anemia  -does not appear to be an issue at this time     [place]: Basic Metabolic Panel: Recent Labs  Lab 08/08/19 0313 08/09/19 0933 08/10/19 0553  NA 133* 131* 132*  K 4.0 3.8 4.1  CL 91* 90* 90*  CO2 26 23 23   GLUCOSE 122* 155* 110*  BUN 71* 78* 85*  CREATININE 4.50* 5.76* 7.15*  CALCIUM 9.1 9.2 9.2  PHOS  --   --  5.5*   Liver Function Tests: Recent Labs  Lab 08/10/19 0553  ALBUMIN 3.3*   No results for input(s): LIPASE, AMYLASE in the last 168 hours. No results for input(s): AMMONIA in the last 168 hours. CBC: Recent Labs  Lab 08/03/19 1415  WBC 10.3  NEUTROABS 6.6  HGB 16.9  HCT 53.3*  MCV 88.2  PLT 305   Cardiac Enzymes: Recent Labs  Lab 08/03/19 1810  CKTOTAL 72   CBG: No results for input(s): GLUCAP in the last 168 hours.  Iron Studies: No results for input(s): IRON, TIBC, TRANSFERRIN, FERRITIN in the last 72 hours. Studies/Results: No results found. Medications: Infusions: . sodium chloride Stopped (08/05/19 0958)  . amiodarone 30 mg/hr (08/09/19 2125)    Scheduled Medications: . apixaban  5 mg Oral BID  . docusate sodium  200 mg Oral BID  . latanoprost  1 drop Both Eyes QHS  . midodrine  10 mg Oral TID WC  . pantoprazole  20 mg Oral Daily  . polyethylene glycol  17 g Oral BID  . sodium chloride flush  10-40 mL Intracatheter Q12H  . sodium chloride flush  3 mL Intravenous Q12H  . Tafamidis  61 mg Oral QPC breakfast    have reviewed scheduled and prn medications.  Physical Exam: General: looks younger than 79  NAD Heart: tachy Lungs: dec BP as bases Abdomen: distended Extremities:  pitting edema to hips    08/10/2019,8:23 AM  LOS: 7 days

## 2019-08-10 NOTE — Progress Notes (Addendum)
During medication administration, patient's spouse states that she thought Lasix was discontinued. Currently, Lasix 80 mg IV due now. Patient's spouse requests for RN to speak with MD before giving this medication, RN paged Kathlen Mody, [place]. Kathlen Mody, PA states for RN to page nephrology to ask about giving Lasix. RN paged Royce Macadamia, MD- states to hold lasix this afternoon. RN will carry out orders.

## 2019-08-10 NOTE — Progress Notes (Signed)
During morning handoff report, patient became tearful and stated "I am tired of fighting, I am ready to die". Patient is alert and oriented times 4. RN explained to patient that I will continue to advocate for him and his wishes, he is thankful for this. RN spoke with palliative care NP and Meda Coffee, MD regarding patient's statements. Patient's wife currently at bedside requesting results of renal ultrasound. RN explained to wife that RN is not to release results to them, this is out of scope of practice; MD will release results once they are available. RN asked spouse if she would like for me to paged MD, spouse states "No, I will find out from someone else". Patient currently lying in bed, lunch trey at bedside, states he is comfortable and denies needs at this time. Call bell within reach, bed lowest position.

## 2019-08-10 NOTE — Progress Notes (Signed)
Patient's spouse, Solmon Ice, currently at bedside and requests for MD to call her on her mobile cell phone [phone]) to speak with her regarding updates after morning rounds every morning

## 2019-08-10 NOTE — Plan of Care (Signed)
  Problem: Education: Goal: Knowledge of General Education information will improve Description: Including pain rating scale, medication(s)/side effects and non-pharmacologic comfort measures Outcome: Completed/Met   Problem: Health Behavior/Discharge Planning: Goal: Ability to manage health-related needs will improve Outcome: Completed/Met   Problem: Clinical Measurements: Goal: Ability to maintain clinical measurements within normal limits will improve Outcome: Completed/Met   Problem: Cardiac: Goal: Ability to achieve and maintain adequate cardiopulmonary perfusion will improve Outcome: Completed/Met

## 2019-08-10 NOTE — Progress Notes (Signed)
Chaplain provided support to Christopher Benton wife and Benton, Christopher Benton.  Christopher Benton spent time trying to theologically understand Christopher Benton's condition.  Chaplain affirmed Christopher questions and grief.  Christopher Benton offered comfort to Christopher Benton and expressed Christopher faith and trust in God during this time.  Chaplain served as compassionate listening presence.    Chaplain will follow-up.

## 2019-08-10 NOTE — Progress Notes (Signed)
Progress Note  Patient Name: Christopher Benton Date of Encounter: 08/10/2019  Primary Cardiologist: Kirk Ruths, MD   Subjective   The patient underwent renal ultrasound this morning, currently sleepy, wife said that he has been sleeping and drowsy ever since she has arrived.  Inpatient Medications    Scheduled Meds: . apixaban  5 mg Oral BID  . docusate sodium  200 mg Oral BID  . furosemide  80 mg Intravenous Q8H  . latanoprost  1 drop Both Eyes QHS  . midodrine  10 mg Oral TID WC  . pantoprazole  20 mg Oral Daily  . polyethylene glycol  17 g Oral BID  . sodium chloride flush  10-40 mL Intracatheter Q12H  . sodium chloride flush  3 mL Intravenous Q12H  . Tafamidis  61 mg Oral QPC breakfast   Continuous Infusions: . sodium chloride Stopped (08/05/19 0958)  . amiodarone 30 mg/hr (08/10/19 0858)   PRN Meds: sodium chloride, acetaminophen, guaiFENesin-dextromethorphan, ondansetron (ZOFRAN) IV, promethazine, sodium chloride flush, sodium chloride flush   Vital Signs    Vitals:   08/10/19 0642 08/10/19 0753 08/10/19 0800 08/10/19 1041  BP:   104/85 102/71  Pulse:    90  Resp:      Temp: 97.7 F (36.5 C) 98.2 F (36.8 C)    TempSrc: Oral Oral    SpO2:   97% 98%  Weight:      Height:        Intake/Output Summary (Last 24 hours) at 08/10/2019 1044 Last data filed at 08/10/2019 0900 Gross per 24 hour  Intake 539.53 ml  Output 100 ml  Net 439.53 ml   Last 3 Weights 08/10/2019 08/08/2019 08/07/2019  Weight (lbs) 212 lb 4.8 oz 210 lb 4.8 oz 210 lb 12.8 oz  Weight (kg) 96.299 kg 95.391 kg 95.618 kg      Telemetry    A-fib with RVR - Personally Reviewed  ECG    No new tracing - Personally Reviewed  Physical Exam   GEN: Appears to be very weak.   Neck: No JVD Cardiac: iRRR, no murmurs, rubs, or gallops.  Respiratory: anterior exam clear, but posterior exam shows diminished breath sound bilaterally.  GI: Soft, nontender, non-distended  MS: No deformity. 2+  RLE edema, trace LLE edema Neuro:  Nonfocal  Psych: flat   Labs    High Sensitivity Troponin:  No results for input(s): TROPONINIHS in the last 720 hours.    Chemistry Recent Labs  Lab 08/08/19 0313 08/09/19 0933 08/10/19 0553  NA 133* 131* 132*  K 4.0 3.8 4.1  CL 91* 90* 90*  CO2 26 23 23   GLUCOSE 122* 155* 110*  BUN 71* 78* 85*  CREATININE 4.50* 5.76* 7.15*  CALCIUM 9.1 9.2 9.2  ALBUMIN  --   --  3.3*  GFRNONAA 12* 9* 7*  GFRAA 13* 10* 8*  ANIONGAP 16* 18* 19*     Hematology Recent Labs  Lab 08/03/19 1415  WBC 10.3  RBC 6.04*  HGB 16.9  HCT 53.3*  MCV 88.2  MCH 28.0  MCHC 31.7  RDW 15.3  PLT 305    BNP Recent Labs  Lab 08/03/19 1400  BNP 901.9*     DDimer  Recent Labs  Lab 08/03/19 1810  DDIMER 5.43*     Radiology    No results found.  Cardiac Studies   Echo 12/06/2018 1. The left ventricle has moderately reduced systolic function, with an  ejection fraction of 35-40%. The cavity size  was normal. There is  moderately increased left ventricular wall thickness. Left ventricular  diastolic Doppler parameters are consistent  with pseudonormalization.  2. The right ventricle has normal systolic function. The cavity was  normal. There is no increase in right ventricular wall thickness.  3. Left atrial size was moderately dilated.  4. Right atrial size was severely dilated.  5. Mild calcification of the aortic valve. Aortic valve regurgitation was  not assessed by color flow Doppler.  6. The inferior vena cava was dilated in size with <50% respiratory  variability.   Patient Profile     79 y.o. 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 He has chronic LE edema R > L. While in the ED, telemetry shwoed SVT, Dr. Stanford Breed tried carotid massage and valsalva  maneuver which broke the rhythm into sius, however it reverted right back to SVT.  Assessment & Plan    1. SVT/paroxysmal atrial fibrillation: post termination, he had Mobitz 1. Tikosyn stopped, transitioned to IV amiodarone. EP is following, but also not much more to offer this patient due to declining health and end stage CHF. Today he is in atrial fibrillation with RVR, he is being followed by EP, they would like to continue amiodarone.  2. Acute on chronic systolic and diastolic CHF  - Echo in July 2020 showed EF 35-40%  - demadex was increased to 40mg  BID with PRN metolazone during office visit in Feb 2021. Wife also given the patient 3 days of metolazone prior to arrival.   - CXR showed moderate bilateral pleural effusion  -We initially attempted IV diuresis however this will stop due to worsening renal function, he received additional 2 doses as he gained over 6 kg in just 2 days with significant lower extremity edema, however his kidney function continues to deteriorate even without use of Lasix.  Today's creatinine 5.76.  We will call nephrology for input, he might not be a great dialysis candidate given underlying amyloidosis and severe hypotension on Midodrin.   3. 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 H/o PAF on Eliquis  - Tikosyn stopped due to failure. On IV amiodarone. Previous on metoprolol, this was stopped as well given his BP  5. Coronary artery calcification seen on CT  6. Acute on chronic renal insufficiency: baseline 1.6. Cr on arrival 2.03, then 1.74 --> 2.75 --> 3.33 --> 3.78-->4.5->5.76-->7.15,  This is a very difficult situation, we have been having discussions with wife and family including palliative care since Monday, patient's wife wants to continue full measures she is not ready to change CODE STATUS to DNR, she was explained and offered for home hospice care however she refused.  Per nurse the patient was crying this morning and stating he is ready to die, he appears  very tired and short of breath, however family is not accepting his wishes.   Nephrology stop by yesterday and explained that hemodialysis is not an option for him, he could potentially tolerate peritoneal dialysis that is not offered in our institution and would have to be transferred.  Patient's wife wants to discuss with nephrology again, will call them.    At this point he continues to deteriorate, he is CHF is worsening, he is retaining more more fluid along with worsening kidney function, his creatinine is now 7, he still making a  small amount of urine, we held diuretics.  At this point there is nothing else we can offer in the hospital and will be planning for discharge later today after nephrology talks to the patient.  We will arrange for follow-up in our clinic next week.    For questions or updates, please contact [place] Please consult [email or web address] for contact info under     Signed, Ena Dawley, MD  08/10/2019, 10:44 AM

## 2019-08-11 LAB — RENAL FUNCTION PANEL
Albumin: 3.4 g/dL — ABNORMAL LOW (ref 3.5–5.0)
Anion gap: 23 — ABNORMAL HIGH (ref 5–15)
BUN: 88 mg/dL — ABNORMAL HIGH (ref 8–23)
CO2: 23 mmol/L (ref 22–32)
Calcium: 9.5 mg/dL (ref 8.9–10.3)
Chloride: 86 mmol/L — ABNORMAL LOW (ref 98–111)
Creatinine, Ser: 7.93 mg/dL — ABNORMAL HIGH (ref 0.61–1.24)
GFR calc Af Amer: 7 mL/min — ABNORMAL LOW (ref >60)
GFR calc non Af Amer: 6 mL/min — ABNORMAL LOW (ref >60)
Glucose, Bld: 122 mg/dL — ABNORMAL HIGH (ref 70–99)
Phosphorus: 6.3 mg/dL — ABNORMAL HIGH (ref 2.5–4.6)
Potassium: 4.3 mmol/L (ref 3.5–5.1)
Sodium: 132 mmol/L — ABNORMAL LOW (ref 135–145)

## 2019-08-11 NOTE — Progress Notes (Signed)
Subjective:  no UOP recorded-  crt up again - appreciate Dr. Jeffie Pollock and Stanford Breed facilitating the conversation that unfortunately there is NOTHING that can be done to save Christopher Benton-  His heart will fail and he will die.  He is too unstable to consider dialysis in any form and the best course at this point is to transition to comfort care so his family can be with him.  Christopher Benton is alone today   "I understand everything"   Objective Vital signs in last 24 hours: Vitals:   08/11/19 0528 08/11/19 0529 08/11/19 0530 08/11/19 0830  BP:    92/75  Pulse: (!) 45 (!) 45 (!) 104 (!) 110  Resp: 17 15 15 18   Temp:  (!) 97.3 F (36.3 C)    TempSrc:  Oral    SpO2: 93% 95% 93% 91%  Weight:  98 kg    Height:       Weight change: 1.701 kg  Intake/Output Summary (Last 24 hours) at 08/11/2019 0850 Last data filed at 08/11/2019 0300 Gross per 24 hour  Intake 1154.79 ml  Output 0 ml  Net 1154.79 ml     Assessment/Plan: 79 year old black male with mostly cardiac issues-cardiac amyloid, decreased ejection fraction, atrial fibrillation and SVT.  He has CKD but has developed acute on chronic CKD during this hospitalization for arrhythmias and hemodynamic instability 1.Renal- acute on chronic renal failure in the setting of hemodynamic instability, hypotension and tachycardia.  Urinalysis is bland .   renal ultrasound negative for obstruction so is true cardiorenal syndrome.  This is a very difficult situation.  According to cardiology his cardiac function will continue to decline and this will lead to his death.  There is nothing left to offer.  With this in mind I really cannot offer dialysis either   2. Hypertension/volume  -volume overloaded and hypotensive and tachycardic.  He is on midodrine, have increased that to max dose.  I agree that he is volume overloaded, however is not hypoxic. Albumin is not bad so is likely not third spacing.  Is not making urine with lasix.  Probably has established  ATN.  Will lead to him dying in likely days to weeks 3.  Cardiomyopathy-  "end stage heart" with no options.  Appreciate Dr. Stanford Breed having that conversation.  Wife is having a very difficult time accepting 4. Anemia  -does not appear to be an issue at this time  Given the situation now clarified there is nothing that renal has to offer.  The only reasonable option is to transition to comfort care and allow him to be with his family until the end-  I would anticipate his life expectancy is days.  Renal will sign off       Louis Meckel    Labs: Basic Metabolic Panel: Recent Labs  Lab 08/09/19 0933 08/10/19 0553 08/11/19 0435  NA 131* 132* 132*  K 3.8 4.1 4.3  CL 90* 90* 86*  CO2 23 23 23   GLUCOSE 155* 110* 122*  BUN 78* 85* 88*  CREATININE 5.76* 7.15* 7.93*  CALCIUM 9.2 9.2 9.5  PHOS  --  5.5* 6.3*   Liver Function Tests: Recent Labs  Lab 08/10/19 0553 08/11/19 0435  ALBUMIN 3.3* 3.4*   No results for input(s): LIPASE, AMYLASE in the last 168 hours. No results for input(s): AMMONIA in the last 168 hours. CBC: No results for input(s): WBC, NEUTROABS, HGB, HCT, MCV, PLT in the last 168 hours. Cardiac Enzymes: No results  for input(s): CKTOTAL, CKMB, CKMBINDEX, TROPONINI in the last 168 hours. CBG: No results for input(s): GLUCAP in the last 168 hours.  Iron Studies: No results for input(s): IRON, TIBC, TRANSFERRIN, FERRITIN in the last 72 hours. Studies/Results: US RENAL  Result Date: 08/10/2019 CLINICAL DATA:  Acute kidney injury.  Inpatient. EXAM: RENAL / URINARY TRACT ULTRASOUND COMPLETE COMPARISON:  06/13/2019 CT abdomen/pelvis FINDINGS: Right Kidney: Renal measurements: 12.2 x 6.0 x 6.6 cm = volume: 251 mL. No hydronephrosis. Echogenic normal thickness renal parenchyma. No mass. Left Kidney: Renal measurements: 12.0 x 6.3 x 6.5 cm = volume: 258 mL. No hydronephrosis. Echogenic normal thickness renal parenchyma. No mass. Bladder: Bladder decompressed and  not well evaluated with no bladder abnormality demonstrated. Other: Incidental large bilateral pleural effusions demonstrated. IMPRESSION: 1. No hydronephrosis. 2. Echogenic normal size kidneys bilaterally, compatible with the provided clinical history of nonspecific acute renal parenchymal disease. 3. Bladder decompressed and not well evaluated. 4. Incidental large bilateral pleural effusions. Electronically Signed   By: Ilona Sorrel M.D.   On: 08/10/2019 10:43   Medications: Infusions: . sodium chloride Stopped (08/05/19 0958)  . amiodarone 30 mg/hr (08/11/19 6269)    Scheduled Medications: . apixaban  5 mg Oral BID  . docusate sodium  200 mg Oral BID  . latanoprost  1 drop Both Eyes QHS  . midodrine  10 mg Oral TID WC  . pantoprazole  20 mg Oral Daily  . polyethylene glycol  17 g Oral BID  . sodium chloride flush  10-40 mL Intracatheter Q12H  . sodium chloride flush  3 mL Intravenous Q12H  . Tafamidis  61 mg Oral QPC breakfast    have reviewed scheduled and prn medications.  Physical Exam: General: looks tired "I am sick, sick, sick"  Heart: tachy Lungs: dec BP as bases Abdomen: distended Extremities: pitting edema to hips    08/11/2019,8:50 AM  LOS: 8 days

## 2019-08-11 NOTE — Progress Notes (Signed)
Pharmacy made RN aware that patient supply of Tafamadis is out. RN contacted patient's spouse, Solmon Ice, she states she will bring more of this medication from home to supply for today.

## 2019-08-11 NOTE — Progress Notes (Signed)
Patient's spouse adamantly requesting to speak with Moshe Cipro, MD again. When RN asked spouse if there was anything I could help with, she stated "No, I just need to speak with the doctor". Moshe Cipro MD paged.

## 2019-08-11 NOTE — Progress Notes (Signed)
Wife wanted to speak to me about peritoneal dialysis.  I sat down and spoke with her.  I told her that after I spoke with Dr. Jeffie Pollock I was able to understand a little better what amyloid was doing to his heart.  He told me that the damage would continue and that eventually his heart would effectively cease to function and he would die.  The kidney failure is completely a result of cardiac dysfunction.  With that in mind I agree that dialysis in any form would not be feasable-  It would be futile care and I explained that concept.  I explained that no matter what anyone did he was going to die.  Now with the renal failure I expect his life expectancy is days to weeks.  She was shocked and angry to hear me say this.  Was on the phone with her husbands sister who was a helpful facilitator.  She was encouraged to continue to pray and no matter what happened she would be strong in prayer.    I asked her if there was anything we could do- I told her that in these circumstances we could likely suspend some of the visitor rules so that Elad and she could have more support. I talked to the floor nurse - said he could have up to 4 family members which they are already doing.  IF was made full comfort care could have more or potentially go home with hospice to spend the last days of life with family and comffortable.  Will reach out to palliative care and Dr. Stanford Breed to possibly facilitate this   Christopher Benton

## 2019-08-11 NOTE — Progress Notes (Addendum)
Patient's wife currently at bedside, requesting to speak with nephrologist regarding dialysis. Moshe Cipro, MD paged.

## 2019-08-11 NOTE — Progress Notes (Signed)
Progress Note  Patient Name: Christopher Benton Date of Encounter: 08/11/2019  Primary Cardiologist: Kirk Ruths, MD   Subjective   The patient is upright in the bed denies any chest pain or significant shortness of breath.  Inpatient Medications    Scheduled Meds: . apixaban  5 mg Oral BID  . docusate sodium  200 mg Oral BID  . latanoprost  1 drop Both Eyes QHS  . midodrine  10 mg Oral TID WC  . pantoprazole  20 mg Oral Daily  . polyethylene glycol  17 g Oral BID  . sodium chloride flush  10-40 mL Intracatheter Q12H  . sodium chloride flush  3 mL Intravenous Q12H  . Tafamidis  61 mg Oral QPC breakfast   Continuous Infusions: . sodium chloride Stopped (08/05/19 0958)  . amiodarone 30 mg/hr (08/11/19 0653)   PRN Meds: sodium chloride, acetaminophen, guaiFENesin-dextromethorphan, ondansetron (ZOFRAN) IV, promethazine, sodium chloride flush, sodium chloride flush   Vital Signs    Vitals:   08/11/19 0900 08/11/19 1000 08/11/19 1159 08/11/19 1248  BP:  107/88 99/85   Pulse: (!) 116 (!) 115 (!) 116   Resp: 20 20 17    Temp:    (!) 97.5 F (36.4 C)  TempSrc:    Oral  SpO2: 93% 93% 95%   Weight:      Height:        Intake/Output Summary (Last 24 hours) at 08/11/2019 1306 Last data filed at 08/11/2019 0300 Gross per 24 hour  Intake 848.22 ml  Output 0 ml  Net 848.22 ml   Last 3 Weights 08/11/2019 08/10/2019 08/08/2019  Weight (lbs) 216 lb 0.8 oz 212 lb 4.8 oz 210 lb 4.8 oz  Weight (kg) 98 kg 96.299 kg 95.391 kg      Telemetry    A-fib with RVR - Personally Reviewed  ECG    No new tracing - Personally Reviewed  Physical Exam   GEN: Appears to be very weak.   Neck: No JVD Cardiac: iRRR, no murmurs, rubs, or gallops.  Respiratory: anterior exam clear, but posterior exam shows diminished breath sound bilaterally.  GI: Soft, nontender, non-distended  MS: No deformity. 2+ RLE edema, trace LLE edema Neuro:  Nonfocal  Psych: flat   Labs    High Sensitivity  Troponin:  No results for input(s): TROPONINIHS in the last 720 hours.    Chemistry Recent Labs  Lab 08/09/19 0933 08/10/19 0553 08/11/19 0435  NA 131* 132* 132*  K 3.8 4.1 4.3  CL 90* 90* 86*  CO2 23 23 23   GLUCOSE 155* 110* 122*  BUN 78* 85* 88*  CREATININE 5.76* 7.15* 7.93*  CALCIUM 9.2 9.2 9.5  ALBUMIN  --  3.3* 3.4*  GFRNONAA 9* 7* 6*  GFRAA 10* 8* 7*  ANIONGAP 18* 19* 23*     Hematology No results for input(s): WBC, RBC, HGB, HCT, MCV, MCH, MCHC, RDW, PLT in the last 168 hours.  BNP No results for input(s): BNP, PROBNP in the last 168 hours.   DDimer  No results for input(s): DDIMER in the last 168 hours.   Radiology    US RENAL  Result Date: 08/10/2019 CLINICAL DATA:  Acute kidney injury.  Inpatient. EXAM: RENAL / URINARY TRACT ULTRASOUND COMPLETE COMPARISON:  06/13/2019 CT abdomen/pelvis FINDINGS: Right Kidney: Renal measurements: 12.2 x 6.0 x 6.6 cm = volume: 251 mL. No hydronephrosis. Echogenic normal thickness renal parenchyma. No mass. Left Kidney: Renal measurements: 12.0 x 6.3 x 6.5 cm = volume: 258  mL. No hydronephrosis. Echogenic normal thickness renal parenchyma. No mass. Bladder: Bladder decompressed and not well evaluated with no bladder abnormality demonstrated. Other: Incidental large bilateral pleural effusions demonstrated. IMPRESSION: 1. No hydronephrosis. 2. Echogenic normal size kidneys bilaterally, compatible with the provided clinical history of nonspecific acute renal parenchymal disease. 3. Bladder decompressed and not well evaluated. 4. Incidental large bilateral pleural effusions. Electronically Signed   By: Ilona Sorrel M.D.   On: 08/10/2019 10:43    Cardiac Studies   Echo 12/06/2018 1. The left ventricle has moderately reduced systolic function, with an  ejection fraction of 35-40%. The cavity size was normal. There is  moderately increased left ventricular wall thickness. Left ventricular  diastolic Doppler parameters are consistent  with  pseudonormalization.  2. The right ventricle has normal systolic function. The cavity was  normal. There is no increase in right ventricular wall thickness.  3. Left atrial size was moderately dilated.  4. Right atrial size was severely dilated.  5. Mild calcification of the aortic valve. Aortic valve regurgitation was  not assessed by color flow Doppler.  6. The inferior vena cava was dilated in size with <50% respiratory  variability.   Patient Profile     79 y.o. 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 He has chronic LE edema R > L. While in the ED, telemetry shwoed SVT, Dr. Stanford Breed tried carotid massage and valsalva maneuver which broke the rhythm into sius, however it reverted right back to SVT.  Assessment & Plan    1. SVT/paroxysmal atrial fibrillation: post termination, he had Mobitz 1. Tikosyn stopped, transitioned to IV amiodarone. EP is following, but also not much more to offer this patient due to declining health and end stage CHF. Today he is in atrial fibrillation with RVR, he is being followed by EP, they would like to continue amiodarone.  2. Acute on chronic systolic and diastolic CHF  - Echo in July 2020 showed EF 35-40%  - demadex was increased to 40mg  BID with PRN metolazone during office visit in Feb 2021. Wife also given the patient 3 days of metolazone prior to arrival.   - CXR showed moderate bilateral pleural effusion  -We initially attempted IV diuresis however this will stop due to worsening renal function, he received additional 2 doses as he gained over 6 kg in just 2 days with significant lower extremity edema, however his kidney function continues to deteriorate even without use of Lasix.  Today's creatinine 5.76.  We will call nephrology for input, he might not be a  great dialysis candidate given underlying amyloidosis and severe hypotension on Midodrin.   3. 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 H/o PAF on Eliquis  - Tikosyn stopped due to failure. On IV amiodarone. Previous on metoprolol, this was stopped as well given his BP  5. Coronary artery calcification seen on CT  6. Acute on chronic renal insufficiency: baseline 1.6. Cr on arrival 2.03, then 1.74 --> 2.75 --> 3.33 --> 3.78-->4.5->5.76-->7.15-->7.93,  This is a very difficult situation, we have been having discussions with wife and family including palliative care since Monday, patient's wife wants to continue full measures she is not ready to change CODE STATUS to DNR, she was explained and offered for home hospice care however she refused.  Per nurse the patient was crying  this morning and stating he is ready to die, he appears very tired and short of breath, however family is not accepting his wishes.   Nephrology has seen the patient, and agrees that at this point there is nothing else to offer to this patient.  His kidney function is progressively worsening, he has gained 14 pounds in 1 week, he is becoming anuric and more swollen, slowly more uremic as well he was nauseated and vomiting yesterday.  This patient is actively dying and per his own words he wants to go and does not prolong this condition, however his wife is in denial and trying to find any ways to make him better, we have been having daily discussions with her including primary team, nephrology, palliative care, they are absolutely refusing to change to DNR and home hospice despite explaining that he might be a more comfortable comfort of home and his children, the wife is not ready to hear that, we will have to decide this weekend and either discharge him home without hospice or with hospice, there is nothing else we can offered for this gentleman here in the hospital.    For questions or updates, please contact [facility] Please  consult [email or web address] for contact info under     Signed, Ena Dawley, MD  08/11/2019, 1:06 PM

## 2019-08-12 DIAGNOSIS — Z515 Encounter for palliative care: Secondary | ICD-10-CM

## 2019-08-12 LAB — RENAL FUNCTION PANEL
Albumin: 3.5 g/dL (ref 3.5–5.0)
Anion gap: 22 — ABNORMAL HIGH (ref 5–15)
BUN: 99 mg/dL — ABNORMAL HIGH (ref 8–23)
CO2: 20 mmol/L — ABNORMAL LOW (ref 22–32)
Calcium: 9.2 mg/dL (ref 8.9–10.3)
Chloride: 88 mmol/L — ABNORMAL LOW (ref 98–111)
Creatinine, Ser: 9.28 mg/dL — ABNORMAL HIGH (ref 0.61–1.24)
GFR calc Af Amer: 6 mL/min — ABNORMAL LOW (ref >60)
GFR calc non Af Amer: 5 mL/min — ABNORMAL LOW (ref >60)
Glucose, Bld: 102 mg/dL — ABNORMAL HIGH (ref 70–99)
Phosphorus: 6.6 mg/dL — ABNORMAL HIGH (ref 2.5–4.6)
Potassium: 5.7 mmol/L — ABNORMAL HIGH (ref 3.5–5.1)
Sodium: 130 mmol/L — ABNORMAL LOW (ref 135–145)

## 2019-08-12 MED ORDER — ONDANSETRON 4 MG PO TBDP: 4.0000 mg | ORAL_TABLET | Freq: Four times a day (QID) | ORAL | Status: DC | PRN

## 2019-08-12 MED ORDER — GLYCOPYRROLATE 0.2 MG/ML IJ SOLN
0.2000 mg | INTRAMUSCULAR | Status: DC | PRN
  Filled 2019-08-12: qty 1

## 2019-08-12 MED ORDER — PROMETHAZINE HCL 25 MG/ML IJ SOLN
6.2500 mg | INTRAMUSCULAR | Status: DC | PRN
  Administered 2019-08-12: 6.25 mg via INTRAVENOUS
  Filled 2019-08-12: qty 1

## 2019-08-12 MED ORDER — BIOTENE DRY MOUTH MT LIQD: 15.0000 mL | OROMUCOSAL | Status: DC | PRN

## 2019-08-12 MED ORDER — PROMETHAZINE HCL 25 MG/ML IJ SOLN
12.5000 mg | INTRAMUSCULAR | Status: DC | PRN
  Filled 2019-08-12: qty 1

## 2019-08-12 MED ORDER — HYDROCODONE-ACETAMINOPHEN 5-325 MG PO TABS
1.0000 | ORAL_TABLET | ORAL | Status: DC | PRN
  Administered 2019-08-15: 1 via ORAL
  Filled 2019-08-12: qty 1

## 2019-08-12 MED ORDER — FENTANYL CITRATE (PF) 100 MCG/2ML IJ SOLN
12.5000 ug | INTRAMUSCULAR | Status: DC | PRN
  Administered 2019-08-12: 12.5 ug via INTRAVENOUS
  Filled 2019-08-12: qty 2

## 2019-08-12 MED ORDER — HALOPERIDOL LACTATE 2 MG/ML PO CONC
0.5000 mg | ORAL | Status: DC | PRN
  Filled 2019-08-12: qty 0.3

## 2019-08-12 MED ORDER — HALOPERIDOL LACTATE 5 MG/ML IJ SOLN: 0.5000 mg | INTRAMUSCULAR | Status: DC | PRN

## 2019-08-12 MED ORDER — ONDANSETRON HCL 4 MG/2ML IJ SOLN: 4.0000 mg | Freq: Four times a day (QID) | INTRAMUSCULAR | Status: DC | PRN

## 2019-08-12 MED ORDER — HALOPERIDOL 0.5 MG PO TABS
0.5000 mg | ORAL_TABLET | ORAL | Status: DC | PRN
  Filled 2019-08-12: qty 1

## 2019-08-12 MED ORDER — POLYVINYL ALCOHOL 1.4 % OP SOLN
1.0000 [drp] | Freq: Four times a day (QID) | OPHTHALMIC | Status: DC | PRN
  Filled 2019-08-12: qty 15

## 2019-08-12 MED ORDER — GLYCOPYRROLATE 1 MG PO TABS
1.0000 mg | ORAL_TABLET | ORAL | Status: DC | PRN
  Filled 2019-08-12: qty 1

## 2019-08-12 MED ORDER — FENTANYL CITRATE (PF) 100 MCG/2ML IJ SOLN
12.5000 ug | INTRAMUSCULAR | Status: DC | PRN
  Administered 2019-08-13: 25 ug via INTRAVENOUS
  Filled 2019-08-12: qty 2

## 2019-08-12 MED ORDER — GLYCOPYRROLATE 0.2 MG/ML IJ SOLN
0.2000 mg | INTRAMUSCULAR | Status: DC
  Administered 2019-08-12 – 2019-08-13 (×6): 0.2 mg via INTRAVENOUS
  Filled 2019-08-12 (×8): qty 1

## 2019-08-12 NOTE — Progress Notes (Signed)
With the help of the patients wife he was helped to get comfortable in bed. Right lower extremity elevated to help alleviate pain and heart monitor removed. Wife has inquired if we will continue to monitor him and I reiterated that we will continue to take vitals as appropriate and at her request. Patient seems a little more comfortable after giving teaching on how to use they suction to assist with secretions and after a dose of phenergan, robinol and fentanyl. Will continue to assess patients physical and emotional needs, as well as family needs.

## 2019-08-12 NOTE — Progress Notes (Signed)
Patient continues to have secretions that he is able to expectorate, however it is making him nauseas and he does not have an appetite and does not feel he can swallow medications. IV zofran was given and will offer phenergan if the nausea persists.  Emotional support offered to the patient as he appears very weak and expresses that he is ready to go. Patient states he is in excruciating pain that does not go away. Will follow up on his medications to assess if it would be appropriate to add something for pain control as needed.

## 2019-08-12 NOTE — Progress Notes (Signed)
Daily Progress Note   Patient Name: Christopher Benton       Date: 08/12/2019 DOB: 05/01/1941  Age: 79 y.o. MRN#: [id number] Attending Physician: Lelon Perla, MD Primary Care Physician: Default, Provider, MD Admit Date: 08/03/2019  Reason for Consultation/Follow-up: Establishing goals of care, Non pain symptom management, Pain control and Psychosocial/spiritual support   Epic notes reviewed.  Discussed with Dr. Meda Coffee.     Subjective: Called to bedside by RN. Patient is in 10/10 pain.  He is crying stating I can't hold on any longer.  I want to go to the other side because it has to be better than this.  Originally he stated he wanted to go home but now he says he just wants to die.  It is heart breaking to watch.  I wipe tears from his cheeks.    Asked orientation questions - name, location, DOB, health condition, president  All were answered correctly  Asked "If your heart stops and you stop breathing do you want [name] to attempt to resuscitate you?"  He replied, No, please let me go -  Witnessed by [employer].  Approximately an hour later Christopher Benton returned to bedside and I had the opportunity to speak with her.  She told me the story of Christopher Benton.  She explained that when we are born it is decided when we will die.  We both agreed that what happens is completely in God's hands.  I suggested that nothing we are doing (medically) is going to change anything.  It is in God's hands.  The only thing we can do is treat Christopher Benton as well as possible while we have him here with [name].  We talked about going home vs staying in the hospital.  I suggested that based on my current assessment he is not comfortable and should stay here.  If we can get him comfortable and he stabilizes then he can go  home.  She agreed.  We talked about inviting the children in to spend time with him.  I explained that there are only two times when more than one person can be at bedside - during a Palliative meeting or when a patient's time is expected to be very short.  Christopher Benton time is expected to be very short.  Medically we recommend  that we stop doing aggressive interventions and focus on making him feel better - focus on his comfort and allowing him to spend time with family.  Christopher Benton asked to speak with her husband privately.  I stepped out and gave them 30 minutes to talk privately.  When I returned the Comp's both agreed to DNR - I supported their decision (it is protective at this point to keep him from being harmed at end of life).  They also agreed to comfort measures - discontinuing interventions that are not helping and focusing on symptom management and allowing family to visit.    We prayed together.  Christopher Benton spoke a beautiful prayer in which he asked that God's will be done.  He also expressed out loud that he is very tired and ready to be with Jesus.    Assessment:  Very sweet man and his wife.  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   Patient Profile/HPI:  79 y.o. 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  He has chronic lower extremity edema.  He has chronic LE edema R > L.  He does see Dr. 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 CV tachycardic Resp no distress LE swollen and red  Vital Signs: BP 104/84 (BP Location: Right Arm)    Pulse (!) 115    Temp (!) 97.2 F (36.2 C) (Oral)    Resp 20    Ht 5' 11.5" (1.816 m)    Wt 96 kg    SpO2 91%    BMI 29.11 kg/m  SpO2: SpO2: 91 % O2 Device: O2 Device: Room Air O2 Flow Rate: O2 Flow Rate (L/min): 2 L/min  Intake/output summary:   Intake/Output Summary (Last 24 hours) at 08/12/2019 1520 Last data filed at 08/12/2019 1100 Gross per 24 hour  Intake 385.4 ml  Output 50 ml  Net 335.4 ml   LBM: Last BM Date: 08/09/19 Baseline Weight: Weight: 95.3 kg Most recent weight: Weight: 96 kg       Palliative Assessment/Data: 40%      Patient Active Problem List   Diagnosis Date Noted   Hypotension 08/10/2019   Palliative care by specialist    DNR (do not resuscitate) discussion    Weakness generalized    ARF (acute renal failure) ([place]) 08/03/2019   Supraventricular arrhythmia 08/03/2019   Cellulitis 02/08/2019   Acute  on chronic combined systolic and diastolic CHF (congestive heart failure) ([place]) 02/08/2019   CKD (chronic kidney disease), stage III 02/08/2019   Amyloidosis ([place]) 05/31/2018   Pulmonary edema 05/31/2018   Acute on chronic diastolic CHF (congestive heart failure) ([place]) 05/31/2018   Acute on chronic systolic CHF (congestive heart failure) ([facility])    Shortness of breath 03/08/2018   AKI (acute kidney injury) ([place])    Elevated  troponin    Pleural effusion on right 04/12/2016   Acute on chronic systolic congestive heart failure ([place]) 04/12/2016   CAD (coronary artery disease) 11/27/2015   PAF (paroxysmal atrial fibrillation) ([place]) 09/25/2015   Cardiomyopathy ([place]) 09/25/2015   Typical atrial flutter ([place])    Atypical atrial flutter ([place]) 08/28/2015   Essential hypertension 08/28/2015    Palliative Care Plan    Recommendations/Plan:  Discussed with patient, bedside RN and Dr. Meda Coffee.    Added fentanyl for pain - as it is the opioid least likely to drop his blood pressure or affect his kidney function  Added robinul low dose for secretions as he is bringing up copious pink frothy secretions  Added phenergan IV as patient would prefer not to take a suppository  Changed code status to DNR per patient's clear wishes  Met with patient and wife.  Agreed to DNR and comfort measures.  Please allow family to visit.    If he stabilizes in the next day or two perhaps he can discharge home with hospice.  Goals of Care and Additional Recommendations:  Limitations on Scope of Treatment: Full Comfort Care     Code Status:  DNR  Prognosis:   Hours - Days   Discharge Planning:  Anticipated Hospital Death.   If he stabilizes could consider Hospice House or Hospice at home.  Care plan was discussed with attending physician, RN, patient  Thank you for allowing the Palliative Medicine Team to assist in the care of this patient.  Total time spent:  106 min. Time in 4:00 pm  Time out 5:46 pm.    Greater than 50%  of this time was spent counseling and coordinating care related to the above assessment and plan.  Florentina Jenny, PA-C Palliative Medicine  Please contact Palliative MedicineTeam phone at [phone] for questions and concerns between 7 am - 7 pm.   Please see AMION for individual provider pager numbers.

## 2019-08-12 NOTE — Progress Notes (Addendum)
Progress Note  Patient Name: Christopher Benton Date of Encounter: 08/12/2019  Primary Cardiologist: Kirk Ruths, MD   Subjective   The patient is lethargic, I am unable to arouse him.   Inpatient Medications    Scheduled Meds: . apixaban  5 mg Oral BID  . docusate sodium  200 mg Oral BID  . latanoprost  1 drop Both Eyes QHS  . midodrine  10 mg Oral TID WC  . pantoprazole  20 mg Oral Daily  . polyethylene glycol  17 g Oral BID  . sodium chloride flush  10-40 mL Intracatheter Q12H  . sodium chloride flush  3 mL Intravenous Q12H  . Tafamidis  61 mg Oral QPC breakfast   Continuous Infusions: . sodium chloride Stopped (08/05/19 0958)  . amiodarone 30 mg/hr (08/12/19 0734)   PRN Meds: sodium chloride, acetaminophen, guaiFENesin-dextromethorphan, ondansetron (ZOFRAN) IV, promethazine, sodium chloride flush, sodium chloride flush   Vital Signs    Vitals:   08/11/19 2000 08/12/19 0000 08/12/19 0316 08/12/19 0442  BP: 104/89 101/85  106/82  Pulse: (!) 115   (!) 115  Resp: 19 20  20   Temp:  98.2 F (36.8 C)  98.4 F (36.9 C)  TempSrc:  Oral  Oral  SpO2: 94% 96%  91%  Weight:   96 kg   Height:        Intake/Output Summary (Last 24 hours) at 08/12/2019 1107 Last data filed at 08/11/2019 2239 Gross per 24 hour  Intake 64.49 ml  Output 50 ml  Net 14.49 ml   Last 3 Weights 08/12/2019 08/11/2019 08/10/2019  Weight (lbs) 211 lb 11.2 oz 216 lb 0.8 oz 212 lb 4.8 oz  Weight (kg) 96.026 kg 98 kg 96.299 kg      Telemetry    A-fib with RVR - Personally Reviewed  ECG    No new tracing - Personally Reviewed  Physical Exam   GEN: Appears to be very weak.   Neck: No JVD Cardiac: iRRR, no murmurs, rubs, or gallops.  Respiratory: anterior exam clear, but posterior exam shows diminished breath sound bilaterally.  GI: Soft, nontender, non-distended  MS: No deformity. 2+ RLE edema, trace LLE edema Neuro:  Nonfocal  Psych: flat   Labs    High Sensitivity Troponin:  No  results for input(s): TROPONINIHS in the last 720 hours.    Chemistry Recent Labs  Lab 08/10/19 0553 08/11/19 0435 08/12/19 0409  NA 132* 132* 130*  K 4.1 4.3 5.7*  CL 90* 86* 88*  CO2 23 23 20*  GLUCOSE 110* 122* 102*  BUN 85* 88* 99*  CREATININE 7.15* 7.93* 9.28*  CALCIUM 9.2 9.5 9.2  ALBUMIN 3.3* 3.4* 3.5  GFRNONAA 7* 6* 5*  GFRAA 8* 7* 6*  ANIONGAP 19* 23* 22*     Hematology No results for input(s): WBC, RBC, HGB, HCT, MCV, MCH, MCHC, RDW, PLT in the last 168 hours.  BNP No results for input(s): BNP, PROBNP in the last 168 hours.   DDimer  No results for input(s): DDIMER in the last 168 hours.   Radiology    No results found.  Cardiac Studies   Echo 12/06/2018 1. The left ventricle has moderately reduced systolic function, with an  ejection fraction of 35-40%. The cavity size was normal. There is  moderately increased left ventricular wall thickness. Left ventricular  diastolic Doppler parameters are consistent  with pseudonormalization.  2. The right ventricle has normal systolic function. The cavity was  normal. There is no  increase in right ventricular wall thickness.  3. Left atrial size was moderately dilated.  4. Right atrial size was severely dilated.  5. Mild calcification of the aortic valve. Aortic valve regurgitation was  not assessed by color flow Doppler.  6. The inferior vena cava was dilated in size with <50% respiratory  variability.   Patient Profile     79 y.o. 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 He has chronic LE edema R > L. While in the ED, telemetry shwoed SVT, Dr. Stanford Breed tried carotid massage and valsalva maneuver which broke the rhythm into sius, however it reverted right back to SVT.  Assessment & Plan    1. SVT/paroxysmal atrial  fibrillation: post termination, he had Mobitz 1. Tikosyn stopped, transitioned to IV amiodarone. EP is following, but also not much more to offer this patient due to declining health and end stage CHF. Today he is in atrial fibrillation with RVR, he is being followed by EP, they would like to continue amiodarone.  2. Acute on chronic systolic and diastolic CHF  - Echo in July 2020 showed EF 35-40%  - demadex was increased to 40mg  BID with PRN metolazone during office visit in Feb 2021. Wife also given the patient 3 days of metolazone prior to arrival.   - CXR showed moderate bilateral pleural effusion  -We initially attempted IV diuresis however this will stop due to worsening renal function, he received additional 2 doses as he gained over 6 kg in just 2 days with significant lower extremity edema, however his kidney function continues to deteriorate even without use of Lasix.  Today's creatinine 5.76.  We will call nephrology for input, he might not be a great dialysis candidate given underlying amyloidosis and severe hypotension on Midodrin.   3. 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 H/o PAF on Eliquis  - Tikosyn stopped due to failure. On IV amiodarone, I will discontinue. Previous on metoprolol, this was stopped as well given his BP  5. Coronary artery calcification seen on CT  6. Acute on chronic renal insufficiency: baseline 1.6. Cr on arrival 2.03, then 1.74 --> 2.75 --> 3.33 --> 3.78-->4.5->5.76-->7.15-->7.93-->9.28,  This is a very difficult situation, we have been having discussions with wife and family including palliative care since Monday, patient's wife wants to continue full measures she is not ready to change CODE STATUS to DNR, she was explained and offered for home hospice care however she refused.  Per nurse the patient was crying this morning and stating he is ready to die, he appears very tired and short of breath, however family is not accepting his wishes.   Nephrology has seen the  patient, and agrees that at this point there is nothing else to offer to this patient.  His kidney function is progressively worsening, he has gained 14 pounds in 1 week, he is becoming anuric and more swollen, slowly more uremic as well he was nauseated and vomiting yesterday.  This patient is actively dying and per his own words he wants to go and does not prolong this condition, however his wife is in denial and trying to find any ways to make him better, we have been having daily discussions with her including primary team, nephrology, palliative care, they are absolutely refusing to change to DNR and home hospice despite  explaining that he might be a more comfortable comfort of home and his children, the wife is not ready to hear that, we will have to decide this weekend and either discharge him home without hospice or with hospice, there is nothing else we can offered for this gentleman here in the hospital.    Family hasn't made the decision yet about taking the patient home with home hospice or DNR status.  Unfortunately there is nothing we can offer this gentleman here anymore.  For questions or updates, please contact [place] Please consult [email or web address] for contact info under     Signed, Ena Dawley, MD  08/12/2019, 11:07 AM

## 2019-08-13 DIAGNOSIS — Z7189 Other specified counseling: Secondary | ICD-10-CM

## 2019-08-13 DIAGNOSIS — Z515 Encounter for palliative care: Secondary | ICD-10-CM

## 2019-08-13 MED ORDER — GLYCOPYRROLATE 0.2 MG/ML IJ SOLN
0.1000 mg | Freq: Four times a day (QID) | INTRAMUSCULAR | Status: DC
  Administered 2019-08-13 – 2019-08-14 (×3): 0.1 mg via INTRAVENOUS
  Filled 2019-08-13 (×5): qty 0.5

## 2019-08-13 NOTE — Progress Notes (Addendum)
Patient ID: Christopher Benton, male   DOB: 23-Jan-1941, 79 y.o.   MRN: [id number]   This NP visited patient at the bedside as a follow up to  yesterday's [employer], for palliative medicine needs and emotional support.  Patient is weaker but is alert and oriented.  Patient's wife is at bedside.  Discussion was had and decision made yesterday to shift to a more comfort approach.  She has concerns with a medication that was started yesterday for his secretions/Robinul that it is drying out his throat and making him hoarse. We discussed that it has made a tremendous difference in the amount of secretions and has helped his comfort level.     I will decrease the dose to Robinul 0.1 mg scheduled every 6 hours for secretions.    At this time patient continues to tolerate oral intake rather well.    I assisted  him eat his fruit cup.  Patient is wife verbalizes her desire to continue with his medications as long as he can swallow them.  She verbalizes that she continues to have faith and trust that God can heal.  Questions and concerns addressed   It will be important to continue detailed conversation with patient and his wife regarding treatment plan in order to avoid any miscommunication.  Total time spent on the unit was 35  minutes  Greater than 50% of the time was spent in counseling and coordination of care  Wadie Lessen NP  Palliative Medicine Team Team Phone # [phone] Pager [phone]

## 2019-08-13 NOTE — Progress Notes (Signed)
Patient able to tolerate a few bites of fruit with assistance. He is still hesitant to try and PO medications as he was experiencing so much nausea the last few days Will continue to encourage intake and document hold of medications as appropriate

## 2019-08-13 NOTE — Progress Notes (Signed)
Progress Note  Patient Name: Christopher Benton Date of Encounter: 08/13/2019  Primary Cardiologist: Kirk Ruths, MD   Subjective   The patient appears comfortable, peacefully sleeping.  Inpatient Medications    Scheduled Meds: . apixaban  5 mg Oral BID  . docusate sodium  200 mg Oral BID  . glycopyrrolate  0.2 mg Intravenous Q4H  . latanoprost  1 drop Both Eyes QHS  . pantoprazole  20 mg Oral Daily  . polyethylene glycol  17 g Oral BID  . sodium chloride flush  10-40 mL Intracatheter Q12H  . sodium chloride flush  3 mL Intravenous Q12H  . Tafamidis  61 mg Oral QPC breakfast   Continuous Infusions: . sodium chloride Stopped (08/05/19 0958)   PRN Meds: sodium chloride, acetaminophen, antiseptic oral rinse, fentaNYL (SUBLIMAZE) injection, glycopyrrolate **OR** glycopyrrolate **OR** glycopyrrolate, guaiFENesin-dextromethorphan, haloperidol **OR** haloperidol **OR** haloperidol lactate, HYDROcodone-acetaminophen, ondansetron **OR** ondansetron (ZOFRAN) IV, polyvinyl alcohol, promethazine, sodium chloride flush, sodium chloride flush   Vital Signs    Vitals:   08/12/19 0442 08/12/19 1221 08/12/19 2257 08/13/19 0807  BP: 106/82 104/84 99/79 96/77   Pulse: (!) 115  (!) 122 (!) 119  Resp: 20  16 15   Temp: 98.4 F (36.9 C) (!) 97.2 F (36.2 C) 98.9 F (37.2 C) 98.3 F (36.8 C)  TempSrc: Oral Oral Oral Oral  SpO2: 91%  95% 94%  Weight:      Height:        Intake/Output Summary (Last 24 hours) at 08/13/2019 0927 Last data filed at 08/13/2019 0400 Gross per 24 hour  Intake 862.4 ml  Output --  Net 862.4 ml   Last 3 Weights 08/12/2019 08/11/2019 08/10/2019  Weight (lbs) 211 lb 11.2 oz 216 lb 0.8 oz 212 lb 4.8 oz  Weight (kg) 96.026 kg 98 kg 96.299 kg      Telemetry    A-fib with RVR - Personally Reviewed  ECG    No new tracing - Personally Reviewed  Physical Exam   GEN: Appears to be very weak.   Neck: No JVD Cardiac: iRRR, no murmurs, rubs, or gallops.   Respiratory: anterior exam clear, but posterior exam shows diminished breath sound bilaterally.  GI: Soft, nontender, non-distended  MS: No deformity. 2+ RLE edema, trace LLE edema Neuro:  Nonfocal  Psych: flat   Labs    High Sensitivity Troponin:  No results for input(s): TROPONINIHS in the last 720 hours.    Chemistry Recent Labs  Lab 08/10/19 0553 08/11/19 0435 08/12/19 0409  NA 132* 132* 130*  K 4.1 4.3 5.7*  CL 90* 86* 88*  CO2 23 23 20*  GLUCOSE 110* 122* 102*  BUN 85* 88* 99*  CREATININE 7.15* 7.93* 9.28*  CALCIUM 9.2 9.5 9.2  ALBUMIN 3.3* 3.4* 3.5  GFRNONAA 7* 6* 5*  GFRAA 8* 7* 6*  ANIONGAP 19* 23* 22*     Hematology No results for input(s): WBC, RBC, HGB, HCT, MCV, MCH, MCHC, RDW, PLT in the last 168 hours.  BNP No results for input(s): BNP, PROBNP in the last 168 hours.   DDimer  No results for input(s): DDIMER in the last 168 hours.   Radiology    No results found.  Cardiac Studies   Echo 12/06/2018 1. The left ventricle has moderately reduced systolic function, with an  ejection fraction of 35-40%. The cavity size was normal. There is  moderately increased left ventricular wall thickness. Left ventricular  diastolic Doppler parameters are consistent  with pseudonormalization.  2.  The right ventricle has normal systolic function. The cavity was  normal. There is no increase in right ventricular wall thickness.  3. Left atrial size was moderately dilated.  4. Right atrial size was severely dilated.  5. Mild calcification of the aortic valve. Aortic valve regurgitation was  not assessed by color flow Doppler.  6. The inferior vena cava was dilated in size with <50% respiratory  variability.   Patient Profile     80 y.o. 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 He has chronic LE edema R > L. While in the ED, telemetry shwoed SVT, Dr. Stanford Breed tried carotid massage and valsalva maneuver which broke the rhythm into sius, however it reverted right back to SVT.  Assessment & Plan    1. SVT/paroxysmal atrial fibrillation, we discontinued amiodarone drip as his comfort cares only from now on.  2. Acute on chronic systolic and diastolic CHF  - Echo in July 2020 showed EF 35-40%  -The patient has gained 16 pounds in the last week as he is now anuric.  Not responding to diuretics.   3. Amyloidosis:  tafamidis was discontinued as his comfort care only.  4. H/o PAF on Eliquis  - Tikosyn stopped due to failure. On IV amiodarone, I will discontinue. Previous on metoprolol, this was stopped as well given his BP  5. Coronary artery calcification seen on CT  6. Acute on chronic renal insufficiency: baseline 1.6. Cr on arrival 2.03, then 1.74 --> 2.75 --> 3.33 --> 3.78-->4.5->5.76-->7.15-->7.93-->9.28,  End of life issues, patient was finally changed to comfort care only and his status has been changed to DNR yesterday, patient clearly expressed his wishes of not prolonging this situation as he was in significant respiratory distress, and significant amount of pain.  The wife and family finally agreed to that, there is a list of 4 people that can visit this patient without limitation.  We have stopped amiodarone drip.  The patient is getting small amount of fentanyl for his pain.  He is currently chest pain-free and resting comfortably.   For questions or updates, please contact [place] Please consult [email or web address] for contact info under     Signed, Ena Dawley, MD  08/13/2019, 9:27 AM

## 2019-08-14 MED ORDER — GLYCOPYRROLATE 1 MG PO TABS
1.0000 mg | ORAL_TABLET | Freq: Four times a day (QID) | ORAL | Status: DC
  Administered 2019-08-14 – 2019-08-15 (×4): 1 mg via ORAL
  Filled 2019-08-14 (×6): qty 1

## 2019-08-14 NOTE — TOC Initial Note (Addendum)
Transition of Care [facility]) - Initial/Assessment Note    Patient Details  Name: Christopher Benton MRN: [id number] Date of Birth: 02-17-1941  Transition of Care [facility]) CM/SW Contact:    Zenon Mayo, RN Phone Number: 08/14/2019, 3:02 PM  Clinical Narrative:                 NCM received [facility], NCM contacted wife , she states yes she would like home hospice with [facility] [place], which was given from the referral.  NCM contacted [place] with [facility] [place] and gave the referral.  Wife states he has crutches and shower chair and they do not want any other DME at this time.  She states they may want some later down the road but not right now.  Wife confirmed address that ambulance is to transport patient to tomorrow, she states to set the transport up for 3 pm, this will give her time to get some things taken care of.   3/16- NCM received call from wife stating she will be transporting her husband home ,she does not want ambulance transport.   Expected Discharge Plan: Home w Hospice Care Barriers to Discharge: Continued Medical Work up   Patient Goals and CMS Choice Patient states their goals for this hospitalization and ongoing recovery are:: home with hospice CMS Medicare.gov Compare Post Acute Care list provided to:: Patient Represenative (must comment)(wife)    Expected Discharge Plan and Services Expected Discharge Plan: [place] In-house Referral: Hospice / Palliative Care Discharge Planning Services: CM Consult Post Acute Care Choice: Hospice Living arrangements for the past 2 months: Single Family Home                 DME Arranged: (NA) DME Agency: NA       HH Arranged: RN [place] Agency: [place] Date [place]: 08/14/19 Time HH Agency Contacted: 1502 Representative spoke with at [place]: Myer Haff  Prior Living Arrangements/Services Living arrangements for the past 2  months: [place] with:: Spouse Patient language and need for interpreter reviewed:: Yes        Need for Family Participation in Patient Care: Yes (Comment) Care giver support system in place?: Yes (comment)   Criminal Activity/Legal Involvement Pertinent to Current Situation/Hospitalization: No - Comment as needed  Activities of Daily Living Home Assistive Devices/Equipment: Cane (specify quad or straight) ADL Screening (condition at time of admission) Patient's cognitive ability adequate to safely complete daily activities?: Yes Is the patient deaf or have difficulty hearing?: No Does the patient have difficulty seeing, even when wearing glasses/contacts?: No Does the patient have difficulty concentrating, remembering, or making decisions?: No Patient able to express need for assistance with ADLs?: Yes Does the patient have difficulty dressing or bathing?: Yes Independently performs ADLs?: No Communication: Independent Dressing (OT): Needs assistance Is this a change from baseline?: Pre-admission baseline Grooming: Needs assistance Is this a change from baseline?: Pre-admission baseline Feeding: Independent Bathing: Needs assistance Is this a change from baseline?: Pre-admission baseline Toileting: Independent with device (comment) In/Out Bed: Independent with device (comment) Walks in Home: Independent with device (comment) Does the patient have difficulty walking or climbing stairs?: Yes Weakness of Legs: Both Weakness of Arms/Hands: Both  Permission Sought/Granted                  Emotional Assessment           Psych Involvement:  No (comment)  Admission diagnosis:  Tachycardia, unspecified [R00.0] ARF (acute renal failure) (HCC) [N17.9] Supraventricular arrhythmia [I49.9] AKI (acute kidney injury) ([place]) [N17.9] Patient Active Problem List   Diagnosis Date Noted  . Comfort measures only status   . Hypotension 08/10/2019  . Palliative care  by specialist   . DNR (do not resuscitate) discussion   . Weakness generalized   . ARF (acute renal failure) ([place]) 08/03/2019  . Supraventricular arrhythmia 08/03/2019  . Cellulitis 02/08/2019  . Acute on chronic combined systolic and diastolic CHF (congestive heart failure) ([place]) 02/08/2019  . CKD (chronic kidney disease), stage III 02/08/2019  . Amyloidosis ([place]) 05/31/2018  . Pulmonary edema 05/31/2018  . Acute on chronic diastolic CHF (congestive heart failure) ([place]) 05/31/2018  . Acute on chronic systolic CHF (congestive heart failure) ([place])   . Shortness of breath 03/08/2018  . AKI (acute kidney injury) ([place])   . Elevated troponin   . Pleural effusion on right 04/12/2016  . Acute on chronic systolic congestive heart failure ([place]) 04/12/2016  . CAD (coronary artery disease) 11/27/2015  . PAF (paroxysmal atrial fibrillation) ([place]) 09/25/2015  . Cardiomyopathy ([place]) 09/25/2015  . Typical atrial flutter ([place])   . Atypical atrial flutter ([place]) 08/28/2015  . Essential hypertension 08/28/2015   PCP:  Default, Provider, MD Pharmacy:   [place], [place] - [address] [phone] [place] [address] Phone: [phone] Fax: [phone]  [facility] [phone] - [place], [place] - [address] AT [place] [address] [place] [address] Phone: [phone] Fax: [phone]     Social Determinants of Health (SDOH) Interventions    Readmission Risk Interventions Readmission Risk Prevention Plan 08/14/2019  Transportation Screening Complete  PCP or Specialist Appt within 3-5 Days Complete  HRI or [place] Complete  Social Work Consult for [place] Planning/Counseling Complete  Palliative Care Screening Complete  Medication Review Press photographer) Complete  Some recent data might be hidden

## 2019-08-14 NOTE — Progress Notes (Signed)
Patient ID: Christopher Benton, male   DOB: May 11, 1941, 79 y.o.   MRN: [id number]   This NP visited patient at the bedside as a follow up for palliative medicine needs and emotional support.  Patient is weak but is alert and oriented, he is OOB to the chair. Patient's wife is at bedside.    Continued conversation regarding current medical situation and current treatment plan.  Patient's wife verbalizes that she understands what  the medical team is saying and recommending however at this time she holds true to her faith and belief that God will heal Christopher Benton.  She is unwilling to stop all medications if he is able to take them by mouth.   Emotional support offered.   After discussion with Dr. Stanford Breed decision has been made for patient to transition home tomorrow with hospice.   Patient and his wife are open to hospice services Family verbalizes they would like to work with [facility] [place].  Wife will talk directly with hospice liaison regarding equipment and transportation home.    From a medical standpoint prognosis is limited, likely days to weeks 2/2 to his renal failure.  Questions and concerns addressed   It will be important to continue detailed conversation with patient and his wife regarding treatment plan in order to avoid any miscommunication.  Total time spent on the unit was 25  minutes  Greater than 50% of the time was spent in counseling and coordination of care  Wadie Lessen NP  Palliative Medicine Team Team Phone # [phone] Pager [phone]

## 2019-08-14 NOTE — Progress Notes (Signed)
Chaplain engaged in initial visit with Christopher Benton.  Chaplain has met Christopher Benton and Christopher daughter Christopher Benton on a separate spiritual care visit with only them.  During visit, Christopher Benton shared about integral moments in Christopher life.  He discussed Christopher upbringing, Christopher faith journey, and Christopher family.  Christopher Benton tearfully expressed wanting to protect the peace he has been able to obtain over time, over these years.  He is protective against the things that disrupt Christopher peace.  Chaplain affirmed Christopher need to be protective with respect to what he endured as a child.  Chaplain served as compassionate presence and offered empathic listening.  Chaplain offered prayer over Christopher Benton.   Christopher Benton seems to know what he wants for Christopher healthcare and body but having that conversation with Christopher family seems to be difficult.  Chaplain will continue to follow-up and serve as a liasion between Christopher Benton and Christopher Benton.

## 2019-08-14 NOTE — Progress Notes (Signed)
Chaplain engaged in follow-up visit with Mr. Christopher Benton and his wife.  Chaplain offered further support and empathic listening, and Christopher Benton asked for prayer again before chaplain left.  Chaplain prayed with family.  Chaplain will follow-up about Advanced Directive tomorrow.

## 2019-08-14 NOTE — Progress Notes (Signed)
  Mobility Specialist Criteria Algorithm Info.   Mobility Team: [facility] elevated:Self regulated Activity: Ambulated to bathroom Range of motion: Active;All extremities Level of assistance: Standby assist, set-up cues, supervision of patient - no hands on Assistive device: Front wheel walker Minutes sitting in chair:  Minutes stood: 8 minutes Minutes ambulated: 10 minutes Distance ambulated (ft): 200 ft Mobility response: Tolerated well Bed Position: Chair (Recliner Chair)    08/14/2019 12:30 PM

## 2019-08-14 NOTE — Progress Notes (Signed)
Progress Note  Patient Name: Christopher Benton Date of Encounter: 08/14/2019  Primary Cardiologist: Kirk Ruths, MD   Subjective   No CP or dyspnea  Inpatient Medications    Scheduled Meds: . docusate sodium  200 mg Oral BID  . glycopyrrolate  0.1 mg Intravenous Q6H  . latanoprost  1 drop Both Eyes QHS  . pantoprazole  20 mg Oral Daily  . polyethylene glycol  17 g Oral BID  . sodium chloride flush  10-40 mL Intracatheter Q12H  . sodium chloride flush  3 mL Intravenous Q12H   Continuous Infusions: . sodium chloride Stopped (08/05/19 0958)   PRN Meds: sodium chloride, acetaminophen, antiseptic oral rinse, fentaNYL (SUBLIMAZE) injection, glycopyrrolate **OR** glycopyrrolate **OR** glycopyrrolate, guaiFENesin-dextromethorphan, haloperidol **OR** haloperidol **OR** haloperidol lactate, HYDROcodone-acetaminophen, ondansetron **OR** ondansetron (ZOFRAN) IV, polyvinyl alcohol, promethazine, sodium chloride flush, sodium chloride flush   Vital Signs    Vitals:   08/13/19 0807 08/14/19 0449 08/14/19 1218 08/14/19 1228  BP: 96/77 103/82 (!) 88/73 100/72  Pulse: (!) 119 (!) 124 (!) 122 (!) 120  Resp: 15 18 20    Temp: 98.3 F (36.8 C) 98 F (36.7 C) 98.5 F (36.9 C)   TempSrc: Oral Oral Oral   SpO2: 94% 94% 92%   Weight:      Height:        Intake/Output Summary (Last 24 hours) at 08/14/2019 1352 Last data filed at 08/14/2019 0856 Gross per 24 hour  Intake 360 ml  Output 100 ml  Net 260 ml   Last 3 Weights 08/12/2019 08/11/2019 08/10/2019  Weight (lbs) 211 lb 11.2 oz 216 lb 0.8 oz 212 lb 4.8 oz  Weight (kg) 96.026 kg 98 kg 96.299 kg      Telemetry    Discontinued  Physical Exam   GEN: No acute distress.   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  Assessment & Plan    1 amyloidosis-Long discussion with patient and wife today.  On examination he remains in supraventricular tachycardia despite IV amiodarone.  He has developed acute renal failure and is making little if any urine. Nephrology has stated he is not a dialysis candidate. He is markedly volume overloaded on examination.  I have had multiple discussions with patient and wife previously concerning prognosis.  I have explained that he cannot survive without his kidneys.  We will plan to discharge with hospice care tomorrow if arrangements can be made.  They are agreeable  although his wife continues to have difficulties.  She questioned why tafamidis and apixaban were discontinued.  I explained that tafamidis will not be useful in this setting as he has reached end-of-life.  I also explained that apixaban would not be useful in this situation for the same reason as well as acute renal failure.  Patient seems to understand and wishes to be discharged.  2 supraventricular tachycardia-he appears to be in SVT on exam.  Telemetry has been discontinued.  Amiodarone discontinued as it was having little effect and he is now comfort care.  3 acute renal failure-patient essentially has no renal function.  He had minimal urine output in the past 48 to 72 hours.  I have explained that he cannot survive weeks  without his renal function.  4 acute on chronic combined systolic/diastolic congestive heart failure-markedly volume overloaded likely from CHF and acute renal failure.  For questions or updates, please contact [place] Please consult [email or web address] for contact info under        Signed, Kirk Ruths, MD  08/14/2019, 1:52 PM

## 2019-08-15 DIAGNOSIS — Z66 Do not resuscitate: Secondary | ICD-10-CM

## 2019-08-15 DIAGNOSIS — N185 Chronic kidney disease, stage 5: Secondary | ICD-10-CM

## 2019-08-15 MED ORDER — POLYETHYLENE GLYCOL 3350 17 G PO PACK: 17.0000 g | PACK | Freq: Two times a day (BID) | ORAL | 6 refills | Status: AC

## 2019-08-15 MED ORDER — DOCUSATE SODIUM 100 MG PO CAPS: 200.0000 mg | ORAL_CAPSULE | Freq: Two times a day (BID) | ORAL | 6 refills | Status: AC

## 2019-08-15 MED ORDER — GLYCOPYRROLATE 1 MG PO TABS: 1.0000 mg | ORAL_TABLET | Freq: Four times a day (QID) | ORAL | 6 refills | Status: AC

## 2019-08-15 MED ORDER — PANTOPRAZOLE SODIUM 20 MG PO TBEC: 20.0000 mg | DELAYED_RELEASE_TABLET | Freq: Every day | ORAL | 6 refills | Status: AC

## 2019-08-15 NOTE — Plan of Care (Signed)
?  Problem: Coping: ?Goal: Level of anxiety will decrease ?Outcome: Progressing ?  ?Problem: Safety: ?Goal: Ability to remain free from injury will improve ?Outcome: Progressing ?  ?

## 2019-08-15 NOTE — TOC Transition Note (Signed)
Transition of Care [facility]) - CM/SW Discharge Note   Patient Details  Name: Christopher Benton MRN: [id number] Date of Birth: 07/14/1940  [facility] CM/SW Contact:  Zenon Mayo, RN Phone Number: 08/15/2019, 11:47 AM   Clinical Narrative:    Patient for dc home with hospice today with [facility] [place],  [place] contacted [place] to inform her that wife is here and she will be transporting patient home today via car.    Final next level of care: Home w Hospice Care Barriers to Discharge: No Barriers Identified   Patient Goals and CMS Choice Patient states their goals for this hospitalization and ongoing recovery are:: home with hospice CMS Medicare.gov Compare Post Acute Care list provided to:: (wife)    Discharge Placement                       Discharge Plan and Services In-house Referral: Hospice / Palliative Care Discharge Planning Services: CM Consult Post Acute Care Choice: Hospice          DME Arranged: (NA) DME Agency: NA       HH Arranged: RN [place] Agency: [place] Date [place]: 08/14/19 Time [place]: 1502 Representative spoke with at [place]: Myer Haff  Social Determinants of Health ([place]) Interventions     Readmission Risk Interventions Readmission Risk Prevention Plan 08/14/2019  Transportation Screening Complete  PCP or Specialist Appt within 3-5 Days Complete  HRI or [place] Complete  Social Work Consult for [place] Planning/Counseling Complete  Palliative Care Screening Complete  Medication Review Press photographer) Complete  Some recent data might be hidden

## 2019-08-15 NOTE — Plan of Care (Signed)

## 2019-08-15 NOTE — Progress Notes (Signed)
Progress Note  Patient Name: Christopher Benton Date of Encounter: 08/15/2019  Primary Cardiologist: Kirk Ruths, MD   Subjective   Pt denies dyspnea or CP  Inpatient Medications    Scheduled Meds: . docusate sodium  200 mg Oral BID  . glycopyrrolate  1 mg Oral Q6H  . latanoprost  1 drop Both Eyes QHS  . pantoprazole  20 mg Oral Daily  . polyethylene glycol  17 g Oral BID  . sodium chloride flush  10-40 mL Intracatheter Q12H  . sodium chloride flush  3 mL Intravenous Q12H   Continuous Infusions: . sodium chloride Stopped (08/05/19 0958)   PRN Meds: sodium chloride, acetaminophen, antiseptic oral rinse, fentaNYL (SUBLIMAZE) injection, guaiFENesin-dextromethorphan, haloperidol **OR** haloperidol **OR** haloperidol lactate, HYDROcodone-acetaminophen, ondansetron **OR** ondansetron (ZOFRAN) IV, polyvinyl alcohol, promethazine, sodium chloride flush, sodium chloride flush   Vital Signs    Vitals:   08/14/19 1218 08/14/19 1228 08/14/19 2201 08/15/19 0118  BP: (!) 88/73 100/72 111/82   Pulse: (!) 122 (!) 120 (!) 122   Resp: 20  19   Temp: 98.5 F (36.9 C)  (!) 97.5 F (36.4 C)   TempSrc: Oral  Oral   SpO2: 92%  93%   Weight:    97.3 kg  Height:        Intake/Output Summary (Last 24 hours) at 08/15/2019 0753 Last data filed at 08/15/2019 0113 Gross per 24 hour  Intake 720 ml  Output 520 ml  Net 200 ml   Last 3 Weights 08/15/2019 08/12/2019 08/11/2019  Weight (lbs) 214 lb 8 oz 211 lb 11.2 oz 216 lb 0.8 oz  Weight (kg) 97.297 kg 96.026 kg 98 kg      Telemetry    Discontinued  Physical Exam   GEN: NAD Neck: Positive JVD, supple Cardiac: Regular and tachycardic, no murmur Respiratory: Diminished BS half way up bilaterally; no wheeze GI: Soft, NT/ND MS: 3-4+ edema Neuro:  Grossly intact Psych: Normal affect   Labs     Chemistry Recent Labs  Lab 08/10/19 0553 08/11/19 0435 08/12/19 0409  NA 132* 132* 130*  K 4.1 4.3 5.7*  CL 90* 86* 88*  CO2 23 23  20*  GLUCOSE 110* 122* 102*  BUN 85* 88* 99*  CREATININE 7.15* 7.93* 9.28*  CALCIUM 9.2 9.5 9.2  ALBUMIN 3.3* 3.4* 3.5  GFRNONAA 7* 6* 5*  GFRAA 8* 7* 6*  ANIONGAP 19* 23* 22*     Patient Profile     79 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  Assessment & Plan    1 amyloidosis-as outlined previously I had a long discussion with patient and wife yesterday.  He has end-stage amyloidosis and has developed acute renal failure.  Nephrology has evaluated and feels that he is not a dialysis candidate and I agree with this. He remains markedly volume overloaded on examination.  His prognosis is extremely poor and we have arranged home hospice.  He will be discharged today.  Patient is extremely excited to be able to be at home.  2 supraventricular tachycardia-he has continued to have intermittent SVT despite amiodarone.  This was discontinued as we are initiating comfort care.  3 acute renal failure-urine output remains minimal.  Not a dialysis candidate as outlined above.    4 acute on chronic combined systolic/diastolic congestive heart failure-markedly volume overloaded likely from  CHF and acute renal failure.  Discharge today with hospice care. Greater than 30 minutes PA and physician time. D2  For questions or updates, please contact [place] Please consult [email or web address] for contact info under        Signed, Kirk Ruths, MD  08/15/2019, 7:53 AM

## 2019-08-17 ENCOUNTER — Telehealth (HOSPITAL_COMMUNITY): Payer: Self-pay

## 2019-08-17 NOTE — Telephone Encounter (Signed)
Have attempted several times to contact patient and phone goes straight to voicemail. I have left voicemails asking pt or his spouse to contact the [facility] office to scheduled a follow-up appt w/DB and echo. Per Nira Conn try to schedule follow-up appt with DB for 3/23 @ 10AM or 11AM, if possible.

## 2019-08-24 NOTE — Progress Notes (Signed)
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 Had stress test 8/17 at [facility] that showed EF 41 and no ischemia. Pt admitted 11/17 with dyspnea and right pleural effusion. Had thoracentesis and cytology negative; diuresed. TEE 11/17 showed EF 35-40, biatrial enlargement and moderate TR. Echocardiogram October 2019 showed ejection fraction 45 to 50%; severe left ventricular hypertrophy and amyloid should be considered; moderate diastolic dysfunction; mildly dilated ascending aorta, biatrial enlargement and mild tricuspid regurgitation. CTA October 2019 showed large right pleural effusion. 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  Patient recently admitted to [facility] with incessant SVT.  He was started on amiodarone.  He was also volume overloaded and given IV Lasix.  Despite IV amiodarone he continued to have incessant SVT and his renal function deteriorated.  His diuretics were held.  He was seen by nephrology and felt not to be a candidate for dialysis.  Given his end-stage CHF/amyloid and renal function it was felt that hospice care was indicated and comfort measures as well.  His wife had a very difficult time with these discussions.  Since discharge,.  Since last seen,patient does have dyspnea.  He has bilateral lower extremity edema.  He denies chest pain or syncope.  Current Outpatient Medications  Medication Sig Dispense Refill   docusate sodium (COLACE) 100 MG capsule Take 2 capsules (200 mg total) by mouth 2 (two) times daily. 60 capsule 6   dorzolamide (TRUSOPT) 2 % ophthalmic solution Place 1 drop into both eyes 2 (two) times daily.     DULCOLAX 10 MG suppository SMARTSIG:1 SUPPOS Rectally Every 3 Days PRN     glycopyrrolate (ROBINUL)  1 MG tablet Take 1 tablet (1 mg total) by mouth every 6 (six) hours. 120 tablet 6   haloperidol (HALDOL) 1 MG tablet Take 1 mg by mouth every 4 (four) hours as needed.     hydrOXYzine (ATARAX/VISTARIL) 25 MG tablet Take 25 mg by mouth every 6 (six) hours as needed.     KLS LAXACLEAR 17 GM/SCOOP powder Take 17 g by mouth 2 (two) times daily.     latanoprost (XALATAN) 0.005 % ophthalmic solution Place 1 drop into both eyes at bedtime.     LORazepam (ATIVAN) 0.5 MG tablet Take 0.5 mg by mouth every 4 (four) hours as needed.     omeprazole (PRILOSEC) 20 MG capsule Take 20 mg by mouth 2 (two) times daily.     oxyCODONE (ROXICODONE) 15 MG immediate release tablet Take 15 mg by mouth every 4 (four) hours.     pantoprazole (PROTONIX) 20 MG tablet Take 1 tablet (20 mg total) by mouth daily. 60 tablet 6   polyethylene glycol (MIRALAX / GLYCOLAX) 17 g packet Take 17 g by mouth 2 (two) times daily. 14 each 6   Polyvinyl Alcohol (LIQUID TEARS OP) Place 1-2 drops into both eyes as needed (for dry eyes).     SENNA-S 8.6-50 MG tablet Take 1-2 tablets by mouth 2 (two) times daily as needed.     sodium chloride HYPERTONIC 3 % nebulizer solution      No current facility-administered medications for this visit.     Past Medical History:  Diagnosis Date   Arthritis    "left foot" (06/01/2018)   Atrial fibrillation (HCC)    CAD (coronary artery disease)    Cardiomyopathy ([place])  Cellulitis and abscess of left leg 47/01/6282   Chronic systolic CHF (congestive heart failure) ([place]) 04/12/2016   Dyspnea    Dysrhythmia Atrial flutter   Hepatitis 1960   "? kind; went to hospital; got alot of shots" (06/01/2018)   Hypertension     Past Surgical History:  Procedure Laterality Date   CARDIOVERSION N/A 09/04/2015   Procedure: CARDIOVERSION;  Surgeon: Skeet Latch, MD;  Location: [place];  Service: Cardiovascular;  Laterality: N/A;   IR THORACENTESIS ASP PLEURAL SPACE W/IMG GUIDE   06/02/2018   TEE WITHOUT CARDIOVERSION N/A 09/04/2015   Procedure: TRANSESOPHAGEAL ECHOCARDIOGRAM (TEE);  Surgeon: Skeet Latch, MD;  Location: [place];  Service: Cardiovascular;  Laterality: N/A;   TEE WITHOUT CARDIOVERSION N/A 04/15/2016   Procedure: TRANSESOPHAGEAL ECHOCARDIOGRAM (TEE);  Surgeon: Jerline Pain, MD;  Location: [facility] ENDOSCOPY;  Service: Cardiovascular;  Laterality: N/A;   TOE AMPUTATION Right    4th digit    Social History   Socioeconomic History   Marital status: Married    Spouse name: Not on file   Number of children: 2   Years of education: Not on file   Highest education level: Not on file  Occupational History   Not on file  Tobacco Use   Smoking status: Former Smoker    Packs/day: 0.12    Types: Cigarettes    Quit date: 1990    Years since quitting: 31.2   Smokeless tobacco: Never Used   Tobacco comment: 06/01/2018 "smoked off and on for 30 years; don't know how many years I actually smoked"  Substance and Sexual Activity   Alcohol use: Not Currently    Comment: \   Drug use: Never   Sexual activity: Not on file  Other Topics Concern   Not on file  Social History Narrative   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     Social Determinants of Health   Financial Resource Strain:    Difficulty of Paying Living Expenses:   Food Insecurity:    Worried About Charity fundraiser in the Last Year:    Arboriculturist in the Last Year:   Transportation Needs:    Film/video editor (Medical):    Lack of Transportation (Non-Medical):   Physical Activity:    Days of Exercise per Week:    Minutes of Exercise per Session:   Stress:    Feeling of Stress :   Social Connections:    Frequency of Communication with Friends and Family:    Frequency of Social Gatherings with Friends and Family:    Attends Religious Services:    Active Member of Clubs or Organizations:    Attends Music therapist:     Marital Status:   Intimate Partner Violence:    Fear of Current or Ex-Partner:    Emotionally Abused:    Physically Abused:    Sexually Abused:     Family History  Problem Relation Age of Onset   Heart disease Other        No family history    ROS: no fevers or chills, productive cough, hemoptysis, dysphasia, odynophagia, melena, hematochezia, dysuria, hematuria, rash, seizure activity, orthopnea, PND, pedal edema, claudication. Remaining systems are negative.  Physical Exam: Well-developed chronically ill appearing in no acute distress.  Skin is warm and dry.  HEENT is normal.  Neck is supple.  Chest with diminished breath sounds at the bases right greater than left Cardiovascular exam is  irregular Abdominal exam nontender or distended. No masses palpated. Extremities show 4+ edema. neuro grossly intact   A/P  1 amyloidosis-as outlined above patient is felt to be end-stage with severe congestive heart failure and renal failure.  Not a candidate for dialysis.  Continue hospice measures and comfort care.  Tafamidis has been discontinued.  2 history of supraventricular tachycardia-this was unresponsive to amiodarone.  He was seen by electrophysiology and felt not to be a good candidate for ablation.  Amiodarone has been discontinued in the setting of comfort measures.  3 recent acute renal failure-felt not to be a dialysis candidate.  4 acute on chronic combined systolic/diastolic congestive heart failure-he remains markedly volume overloaded.  Patient is making some urine per his wife.  I have given them the option of taking Demadex 40 mg daily as needed to see if this improves dyspnea and edema.  They understand that this could worsen renal function.  5 paroxysmal atrial fibrillation-apixaban was discontinued previously in setting of hospice/comfort measures.  Patient's prognosis is extremely poor.  Continue hospice/comfort measures.  Kirk Ruths, MD

## 2019-08-29 ENCOUNTER — Telehealth: Payer: Self-pay | Admitting: Cardiology

## 2019-08-29 NOTE — Telephone Encounter (Signed)
   Pt's wife called, she said she received a message from Fredia Beets, she verified Pt's appt tomorrow, however, she still wanted to speak with Hilda Blades  please call

## 2019-08-29 NOTE — Telephone Encounter (Signed)
Spoke with patient's spouse. She wanted to confirm appointment for tomorrow. Confirmed with spouse patient's appointment is at 12p in [place].

## 2019-08-30 ENCOUNTER — Ambulatory Visit (INDEPENDENT_AMBULATORY_CARE_PROVIDER_SITE_OTHER): Payer: No Typology Code available for payment source | Admitting: Cardiology

## 2019-08-30 ENCOUNTER — Encounter: Payer: Self-pay | Admitting: Cardiology

## 2019-08-30 ENCOUNTER — Other Ambulatory Visit: Payer: Self-pay

## 2019-08-30 VITALS — BP 110/68 | HR 50 | Ht 71.5 in

## 2019-08-30 DIAGNOSIS — I499 Cardiac arrhythmia, unspecified: Secondary | ICD-10-CM

## 2019-08-30 DIAGNOSIS — I43 Cardiomyopathy in diseases classified elsewhere: Secondary | ICD-10-CM

## 2019-08-30 DIAGNOSIS — I480 Paroxysmal atrial fibrillation: Secondary | ICD-10-CM

## 2019-08-30 DIAGNOSIS — E854 Organ-limited amyloidosis: Secondary | ICD-10-CM | POA: Diagnosis not present

## 2019-08-30 DIAGNOSIS — I5042 Chronic combined systolic (congestive) and diastolic (congestive) heart failure: Secondary | ICD-10-CM

## 2019-08-30 NOTE — Patient Instructions (Signed)
Medication Instructions:  NO CHANGE *If you need a refill on your cardiac medications before your next appointment, please call your pharmacy*   Lab Work: If you have labs (blood work) drawn today and your tests are completely normal, you will receive your results only by: Marland Kitchen MyChart Message (if you have MyChart) OR . A paper copy in the mail If you have any lab test that is abnormal or we need to change your treatment, we will call you to review the results.   Follow-Up: At [facility], you and your health needs are our priority.  As part of our continuing mission to provide you with exceptional heart care, we have created designated Provider Care Teams.  These Care Teams include your primary Cardiologist (physician) and Advanced Practice Providers (APPs -  Physician Assistants and Nurse Practitioners) who all work together to provide you with the care you need, when you need it.  We recommend signing up for the patient portal called "MyChart".  Sign up information is provided on this After Visit Summary.  MyChart is used to connect with patients for Virtual Visits (Telemedicine).  Patients are able to view lab/test results, encounter notes, upcoming appointments, etc.  Non-urgent messages can be sent to your provider as well.   To learn more about what you can do with MyChart, go to [email or web address].    Your next appointment:   6 week(s)  The format for your next appointment:   In Person  Provider:   Kirk Ruths, MD

## 2019-09-06 IMAGING — NM NM SCAN TUMOR LOCALIZE WITH SPECT
4 series · 19 of 19 positions shown · non-contrast
Comparison: none

CLINICAL DATA: HEART FAILURE. CONCERN FOR CARDIAC AMYLOIDOSIS.

EXAM:
NUCLEAR MEDICINE TUMOR LOCALIZATION. PYP CARDIAC AMYLOIDOSIS SCAN
WITH SPECT
TECHNIQUE: Following intravenous administration of radiopharmaceutical,
anterior planar images of the chest were obtained. Regions of
interest were placed on the heart and contralateral chest wall for
quantitative assessment. Additional SPECT imaging of the chest was
obtained.
RADIOPHARMACEUTICALS:  Twenty-one mCi TECHNETIUM 99 PYROPHOSPHATE

[Series 1: spect - (id)_(id)_tra · 4.1mm · 4.14mm/px · 6 of 128 frames shown]
[frame 11/128]
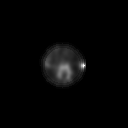
[frame 32/128]
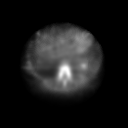
[frame 54/128]
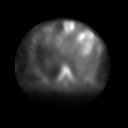
[frame 75/128]
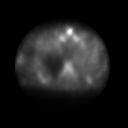
[frame 96/128]
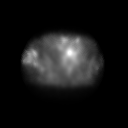
[frame 118/128]
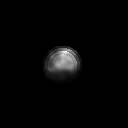

[Series 1: spect - (id)_(id)_cor · 4.1mm · 4.14mm/px · 6 of 128 frames shown]
[frame 11/128]
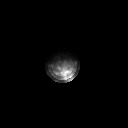
[frame 32/128]
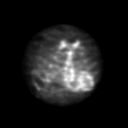
[frame 54/128]
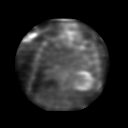
[frame 75/128]
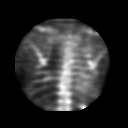
[frame 96/128]
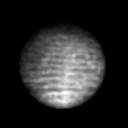
[frame 118/128]
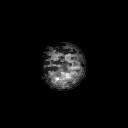

[Series 1: anterior · 4.14mm/px · 1 of 1 slices shown]
[im 1/1]
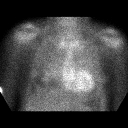

[Series 2: liver spect · 4.14mm/px · 6 of 64 frames shown]
[frame 6/64]
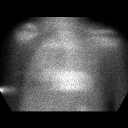
[frame 16/64]
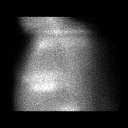
[frame 27/64]
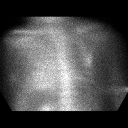
[frame 38/64]
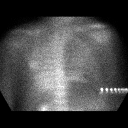
[frame 48/64]
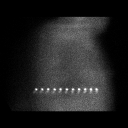
[frame 59/64]
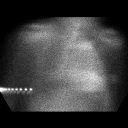

[19 of 19 positions shown; findings below may reference images not displayed]

FINDINGS: Planar Visual assessment:

Anterior planar imaging demonstrates radiotracer uptake within the
heart greater than uptake within the adjacent ribs (Grade 3).

Quantitative assessment :

Quantitative assessment of the cardiac uptake compared to the
contralateral chest wall is equal to 3.4 (H/CL = 3.4).

SPECT assessment: SPECT imaging of the chest demonstrates clear
radiotracer accumulation within the LEFT ventricle.
IMPRESSION: Visual and quantitative assessment (grade 3, H/CL equal 3.4) are
strongly suggestive of transthyretin amyloidosis.

## 2019-09-30 DEATH — deceased

## 2019-10-18 ENCOUNTER — Ambulatory Visit: Payer: No Typology Code available for payment source | Admitting: Cardiology

## 2020-09-09 IMAGING — DX DG CHEST 1V PORT
1 series · 1 of 1 positions shown · non-contrast
Comparison: February 08, 2019.

CLINICAL DATA: Shortness of breath.

EXAM:
PORTABLE CHEST 1 VIEW

[chest ap]
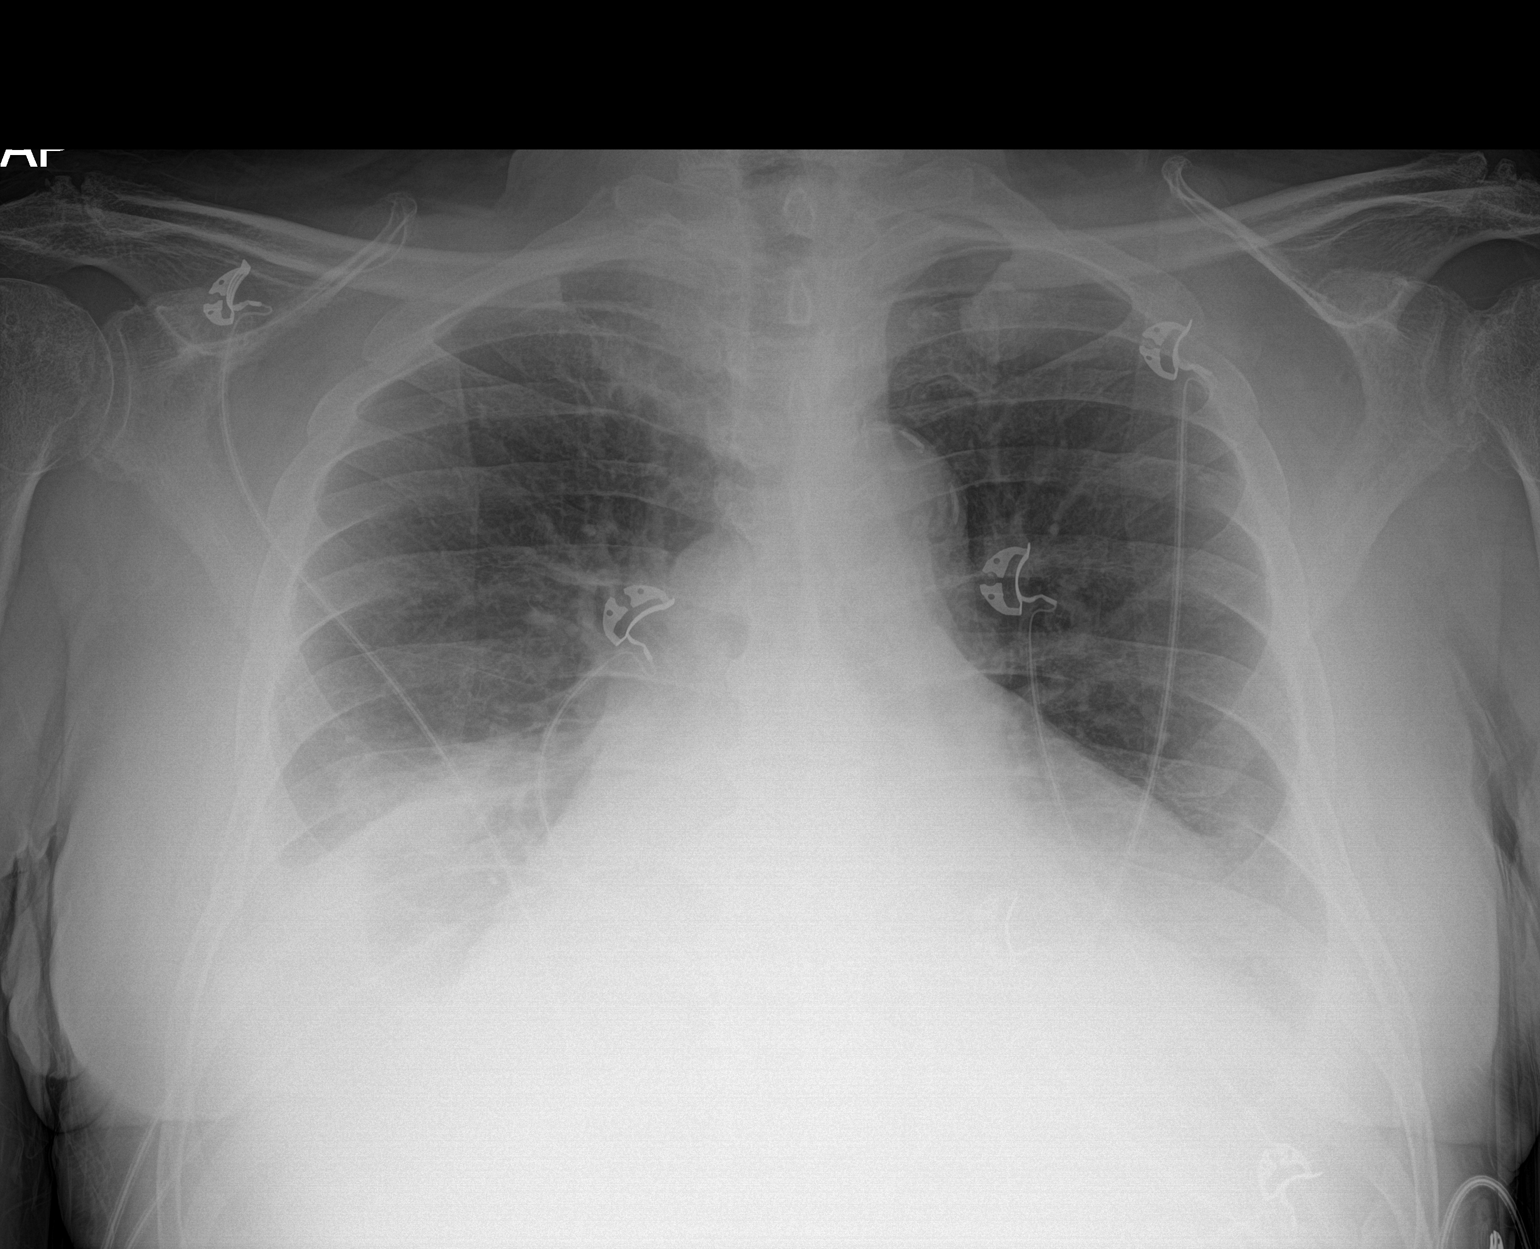

[1 of 1 positions shown; findings below may reference images not displayed]

FINDINGS: Stable cardiomegaly. No pneumothorax is noted. Stable bilateral
pleural effusions are noted with associated bibasilar atelectasis.
Bony thorax is unremarkable.
IMPRESSION: Stable bibasilar atelectasis and associated pleural effusions.

Aortic Atherosclerosis ([id number]).
